# Patient Record
Sex: Male | Born: 1935
Health system: Southern US, Community
[De-identification: ages and names within clinical notes are randomized; demographics above are authoritative.]

## PROBLEM LIST (undated history)

## (undated) DIAGNOSIS — K219 Gastro-esophageal reflux disease without esophagitis: Secondary | ICD-10-CM

## (undated) DIAGNOSIS — I10 Essential (primary) hypertension: Secondary | ICD-10-CM

## (undated) DIAGNOSIS — I639 Cerebral infarction, unspecified: Secondary | ICD-10-CM

## (undated) DIAGNOSIS — I2510 Atherosclerotic heart disease of native coronary artery without angina pectoris: Secondary | ICD-10-CM

## (undated) DIAGNOSIS — I259 Chronic ischemic heart disease, unspecified: Secondary | ICD-10-CM

## (undated) DIAGNOSIS — E785 Hyperlipidemia, unspecified: Secondary | ICD-10-CM

## (undated) DIAGNOSIS — Z951 Presence of aortocoronary bypass graft: Secondary | ICD-10-CM

## (undated) DIAGNOSIS — K589 Irritable bowel syndrome without diarrhea: Secondary | ICD-10-CM

## (undated) DIAGNOSIS — E119 Type 2 diabetes mellitus without complications: Secondary | ICD-10-CM

## (undated) DIAGNOSIS — Z87438 Personal history of other diseases of male genital organs: Secondary | ICD-10-CM

## (undated) DIAGNOSIS — I5189 Other ill-defined heart diseases: Secondary | ICD-10-CM

## (undated) DIAGNOSIS — J310 Chronic rhinitis: Secondary | ICD-10-CM

## (undated) DIAGNOSIS — B029 Zoster without complications: Principal | ICD-10-CM

## (undated) DIAGNOSIS — F419 Anxiety disorder, unspecified: Secondary | ICD-10-CM

## (undated) DIAGNOSIS — K5790 Diverticulosis of intestine, part unspecified, without perforation or abscess without bleeding: Secondary | ICD-10-CM

## (undated) DIAGNOSIS — F32A Depression, unspecified: Secondary | ICD-10-CM

## (undated) DIAGNOSIS — I219 Acute myocardial infarction, unspecified: Secondary | ICD-10-CM

## (undated) DIAGNOSIS — Z8619 Personal history of other infectious and parasitic diseases: Secondary | ICD-10-CM

## (undated) DIAGNOSIS — K760 Fatty (change of) liver, not elsewhere classified: Secondary | ICD-10-CM

## (undated) DIAGNOSIS — C439 Malignant melanoma of skin, unspecified: Secondary | ICD-10-CM

## (undated) DIAGNOSIS — I4510 Unspecified right bundle-branch block: Secondary | ICD-10-CM

## (undated) HISTORY — DX: Hyperlipidemia, unspecified: E78.5

## (undated) HISTORY — DX: Fatty (change of) liver, not elsewhere classified: K76.0

## (undated) HISTORY — DX: Chronic rhinitis: J31.0

## (undated) HISTORY — DX: Cerebral infarction, unspecified: I63.9

## (undated) HISTORY — DX: Gastro-esophageal reflux disease without esophagitis: K21.9

## (undated) HISTORY — DX: Zoster without complications: B02.9

## (undated) HISTORY — DX: Essential (primary) hypertension: I10

## (undated) HISTORY — DX: Other ill-defined heart diseases: I51.89

## (undated) HISTORY — DX: Diverticulosis of intestine, part unspecified, without perforation or abscess without bleeding: K57.90

## (undated) HISTORY — DX: Type 2 diabetes mellitus without complications: E11.9

## (undated) HISTORY — PX: COLONOSCOPY: SHX174

## (undated) HISTORY — DX: Acute myocardial infarction, unspecified: I21.9

## (undated) HISTORY — DX: Personal history of other diseases of male genital organs: Z87.438

## (undated) HISTORY — DX: Malignant melanoma of skin, unspecified: C43.9

## (undated) HISTORY — DX: Personal history of other infectious and parasitic diseases: Z86.19

## (undated) HISTORY — PX: CARDIAC CATHETERIZATION: SHX172

## (undated) HISTORY — DX: Atherosclerotic heart disease of native coronary artery without angina pectoris: I25.10

## (undated) HISTORY — DX: Chronic ischemic heart disease, unspecified: I25.9

---

## 1983-02-19 HISTORY — PX: CHOLECYSTECTOMY: SHX55

## 1993-02-18 DIAGNOSIS — I219 Acute myocardial infarction, unspecified: Secondary | ICD-10-CM

## 1993-02-18 HISTORY — DX: Acute myocardial infarction, unspecified: I21.9

## 1993-05-19 HISTORY — PX: CORONARY ARTERY BYPASS GRAFT: SHX141

## 1997-12-19 ENCOUNTER — Encounter: Payer: Self-pay | Admitting: Family Medicine

## 1997-12-19 LAB — CONVERTED CEMR LAB: PSA: 0.3 ng/mL

## 1999-11-19 ENCOUNTER — Encounter: Payer: Self-pay | Admitting: Family Medicine

## 1999-11-19 LAB — CONVERTED CEMR LAB: PSA: 1.5 ng/mL

## 2000-11-18 HISTORY — PX: INGUINAL HERNIA REPAIR: SUR1180

## 2001-10-19 ENCOUNTER — Encounter: Payer: Self-pay | Admitting: Family Medicine

## 2002-03-21 HISTORY — PX: ANKLE SURGERY: SHX546

## 2004-02-19 ENCOUNTER — Encounter: Payer: Self-pay | Admitting: Family Medicine

## 2004-02-27 ENCOUNTER — Ambulatory Visit: Payer: Self-pay | Admitting: Family Medicine

## 2004-03-05 ENCOUNTER — Ambulatory Visit: Payer: Self-pay | Admitting: Family Medicine

## 2004-03-08 ENCOUNTER — Ambulatory Visit: Payer: Self-pay | Admitting: Family Medicine

## 2004-03-21 ENCOUNTER — Ambulatory Visit: Payer: Self-pay | Admitting: Family Medicine

## 2004-05-10 ENCOUNTER — Ambulatory Visit: Payer: Self-pay | Admitting: Family Medicine

## 2004-08-09 ENCOUNTER — Ambulatory Visit: Payer: Self-pay | Admitting: Family Medicine

## 2004-08-13 ENCOUNTER — Ambulatory Visit: Payer: Self-pay | Admitting: Family Medicine

## 2005-02-26 ENCOUNTER — Ambulatory Visit: Payer: Self-pay | Admitting: Family Medicine

## 2005-04-02 ENCOUNTER — Ambulatory Visit: Payer: Self-pay | Admitting: Gastroenterology

## 2005-04-02 LAB — HM COLONOSCOPY

## 2005-07-11 ENCOUNTER — Ambulatory Visit: Payer: Self-pay | Admitting: Family Medicine

## 2005-07-16 ENCOUNTER — Ambulatory Visit: Payer: Self-pay | Admitting: Family Medicine

## 2005-08-22 ENCOUNTER — Ambulatory Visit: Payer: Self-pay | Admitting: Family Medicine

## 2005-08-27 ENCOUNTER — Ambulatory Visit: Payer: Self-pay | Admitting: Family Medicine

## 2006-02-27 ENCOUNTER — Ambulatory Visit: Payer: Self-pay | Admitting: Family Medicine

## 2006-02-27 LAB — CONVERTED CEMR LAB
ALT: 45 units/L — ABNORMAL HIGH (ref 0–40)
AST: 36 units/L (ref 0–37)
Calcium: 9.8 mg/dL (ref 8.4–10.5)
Chloride: 98 meq/L (ref 96–112)
Cholesterol: 108 mg/dL (ref 0–200)
Creatinine, Ser: 1 mg/dL (ref 0.4–1.5)
Creatinine,U: 143.9 mg/dL
Glomerular Filtration Rate, Af Am: 95 mL/min/{1.73_m2}
Glucose, Bld: 123 mg/dL — ABNORMAL HIGH (ref 70–99)
HDL: 35.5 mg/dL — ABNORMAL LOW (ref >39.0)
LDL Cholesterol: 38 mg/dL (ref 0–99)
Microalb Creat Ratio: 2.8 mg/g (ref 0.0–30.0)
Microalb, Ur: 0.4 mg/dL (ref 0.0–1.9)
Potassium: 4.7 meq/L (ref 3.5–5.1)
Sodium: 138 meq/L (ref 135–145)
Triglyceride fasting, serum: 173 mg/dL — ABNORMAL HIGH (ref 0–149)
VLDL: 35 mg/dL (ref 0–40)

## 2006-03-03 ENCOUNTER — Ambulatory Visit: Payer: Self-pay | Admitting: Family Medicine

## 2006-05-05 ENCOUNTER — Ambulatory Visit: Payer: Self-pay | Admitting: Family Medicine

## 2006-07-01 ENCOUNTER — Encounter: Payer: Self-pay | Admitting: Family Medicine

## 2006-07-01 DIAGNOSIS — I2510 Atherosclerotic heart disease of native coronary artery without angina pectoris: Secondary | ICD-10-CM | POA: Insufficient documentation

## 2006-07-01 DIAGNOSIS — K589 Irritable bowel syndrome without diarrhea: Secondary | ICD-10-CM | POA: Insufficient documentation

## 2006-07-01 DIAGNOSIS — F528 Other sexual dysfunction not due to a substance or known physiological condition: Secondary | ICD-10-CM

## 2006-07-03 ENCOUNTER — Ambulatory Visit: Payer: Self-pay | Admitting: Family Medicine

## 2006-07-03 DIAGNOSIS — E669 Obesity, unspecified: Secondary | ICD-10-CM

## 2006-07-07 ENCOUNTER — Ambulatory Visit: Payer: Self-pay | Admitting: Family Medicine

## 2006-07-07 DIAGNOSIS — B029 Zoster without complications: Secondary | ICD-10-CM | POA: Insufficient documentation

## 2006-07-07 DIAGNOSIS — N3941 Urge incontinence: Secondary | ICD-10-CM | POA: Insufficient documentation

## 2006-07-28 ENCOUNTER — Ambulatory Visit: Payer: Self-pay | Admitting: Family Medicine

## 2006-07-28 ENCOUNTER — Telehealth (INDEPENDENT_AMBULATORY_CARE_PROVIDER_SITE_OTHER): Payer: Self-pay | Admitting: *Deleted

## 2006-07-28 DIAGNOSIS — C44201 Unspecified malignant neoplasm of skin of unspecified ear and external auricular canal: Secondary | ICD-10-CM | POA: Insufficient documentation

## 2006-07-29 ENCOUNTER — Other Ambulatory Visit: Payer: Self-pay

## 2006-07-29 ENCOUNTER — Emergency Department: Payer: No Typology Code available for payment source | Admitting: Emergency Medicine

## 2006-07-30 ENCOUNTER — Telehealth (INDEPENDENT_AMBULATORY_CARE_PROVIDER_SITE_OTHER): Payer: Self-pay | Admitting: *Deleted

## 2006-07-30 ENCOUNTER — Ambulatory Visit: Payer: Self-pay | Admitting: Family Medicine

## 2006-07-30 IMAGING — CR DG CHEST 2V
1 series · 2 of 2 positions shown · non-contrast
Comparison: none

REASON FOR EXAM: fever  rm 14
COMMENTS:

[Series 1: view not recorded · 0.17mm/px · 2 of 2 slices shown]
[im 1/2]
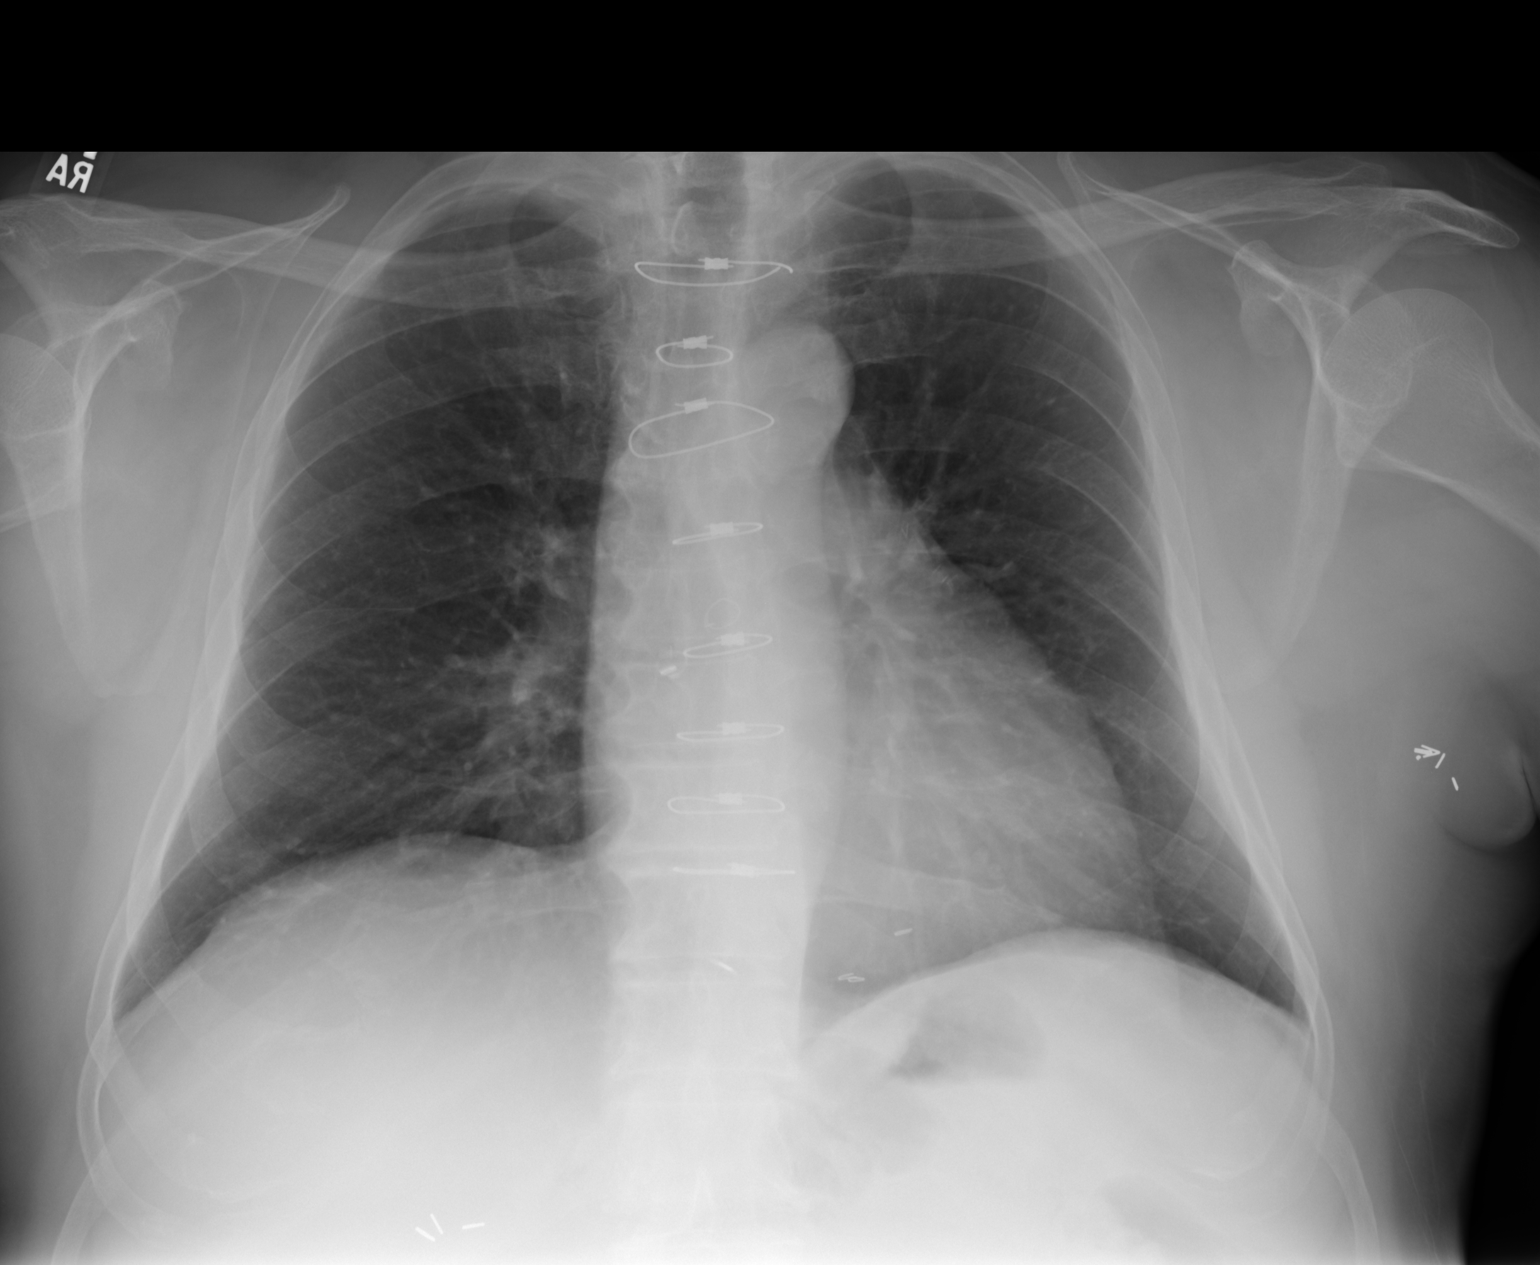
[im 2/2]
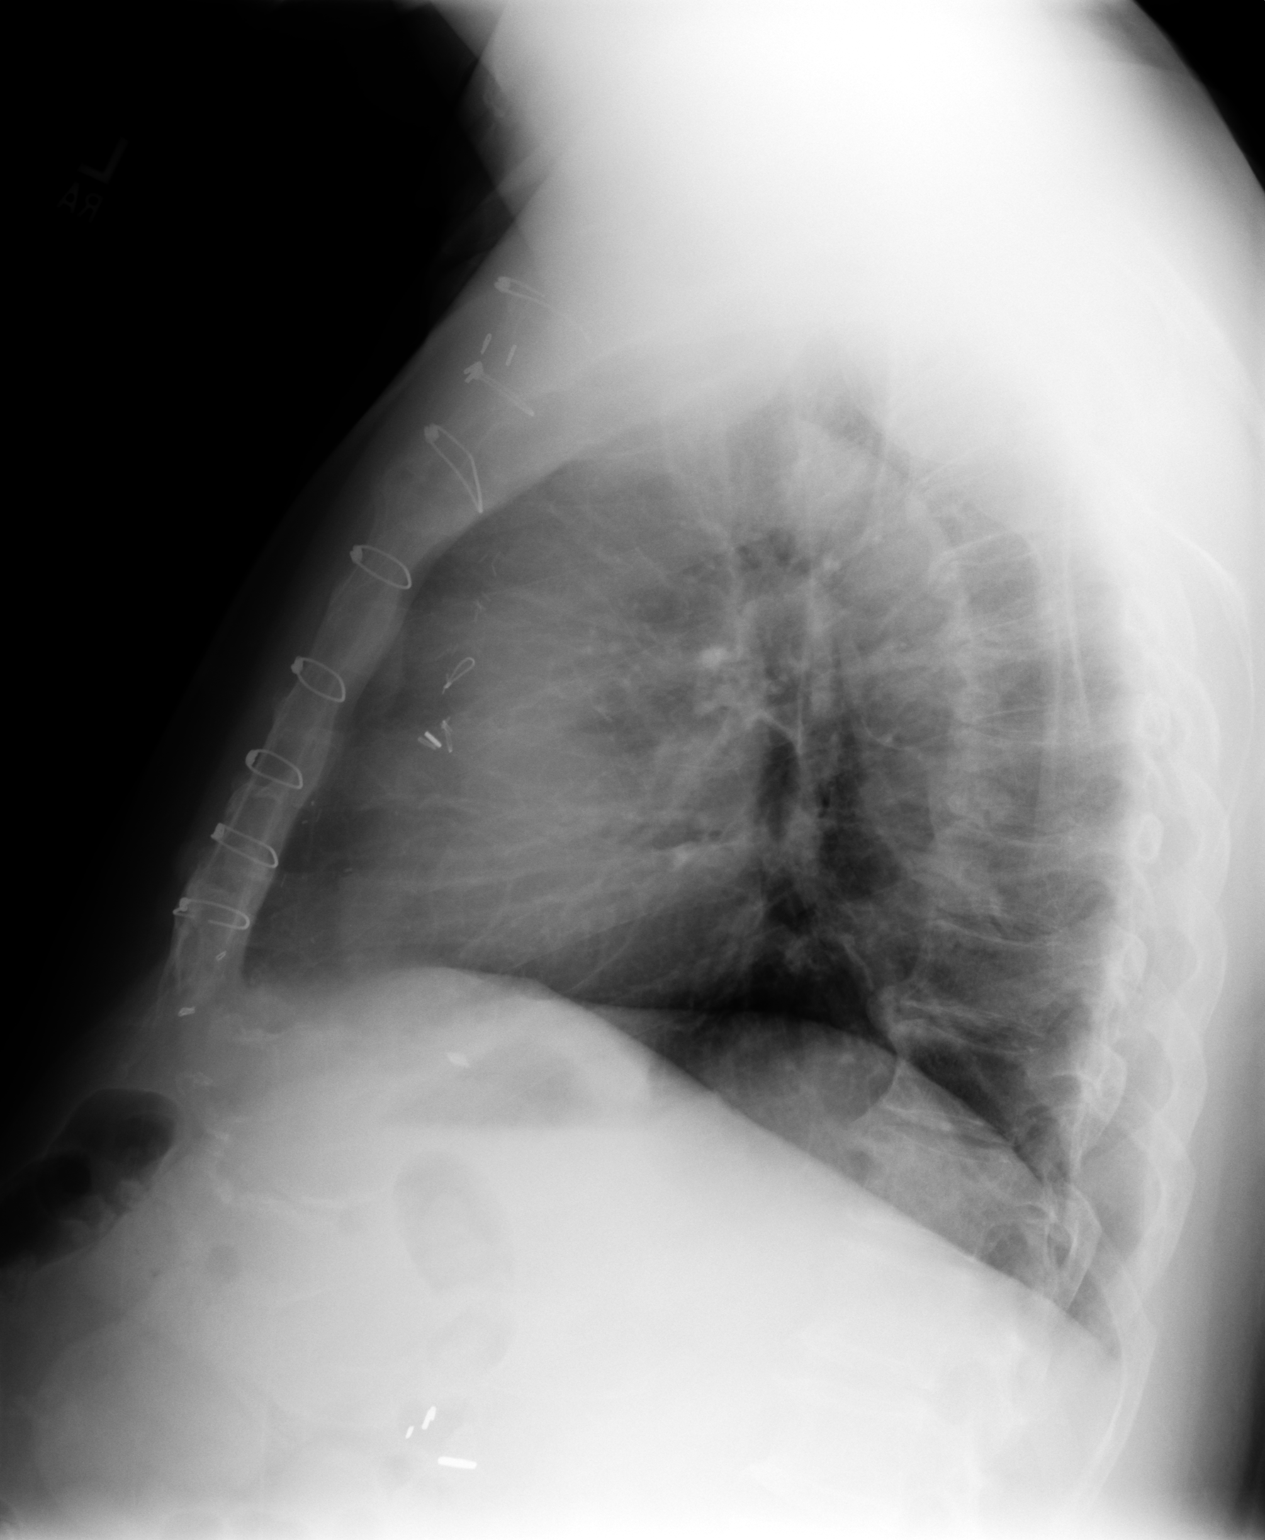

[2 of 2 positions shown; findings below may reference images not displayed]

PROCEDURE:     DXR - DXR CHEST PA (OR AP) AND LATERAL  - [DATE] [DATE]

RESULT:     Lungs are adequately inflated and clear. The cardiac silhouette
is top normal in size. There is tortuosity the descending thoracic aorta.
The patient has undergone prior median sternotomy and presumably internal
mammary artery dissection.
IMPRESSION: I do not see evidence of acute cardiopulmonary abnormality.
Specifically I do not see evidence of pneumonia.

## 2007-01-14 ENCOUNTER — Telehealth: Payer: Self-pay | Admitting: Family Medicine

## 2007-05-07 ENCOUNTER — Ambulatory Visit: Payer: Self-pay | Admitting: Family Medicine

## 2007-05-07 DIAGNOSIS — M549 Dorsalgia, unspecified: Secondary | ICD-10-CM | POA: Insufficient documentation

## 2007-05-07 LAB — CONVERTED CEMR LAB
Blood in Urine, dipstick: NEGATIVE
Glucose, Urine, Semiquant: NEGATIVE
Ketones, urine, test strip: NEGATIVE
Urobilinogen, UA: 0.2
WBC Urine, dipstick: NEGATIVE
pH: 6

## 2007-05-11 ENCOUNTER — Encounter: Payer: Self-pay | Admitting: Family Medicine

## 2007-05-28 ENCOUNTER — Encounter: Payer: Self-pay | Admitting: Family Medicine

## 2007-05-28 ENCOUNTER — Encounter: Admission: RE | Admit: 2007-05-28 | Discharge: 2007-05-28 | Payer: Self-pay | Admitting: Cardiology

## 2007-05-28 IMAGING — US US AORTA
1 series · 14 of 25 positions shown · non-contrast
Comparison: None

CLINICAL DATA: Back pain, question aortic aneurysm.

ULTRASOUND AORTA

[Series 1: us aorta · 0.33mm/px · 14 of 39 slices shown]
[im 1/39]
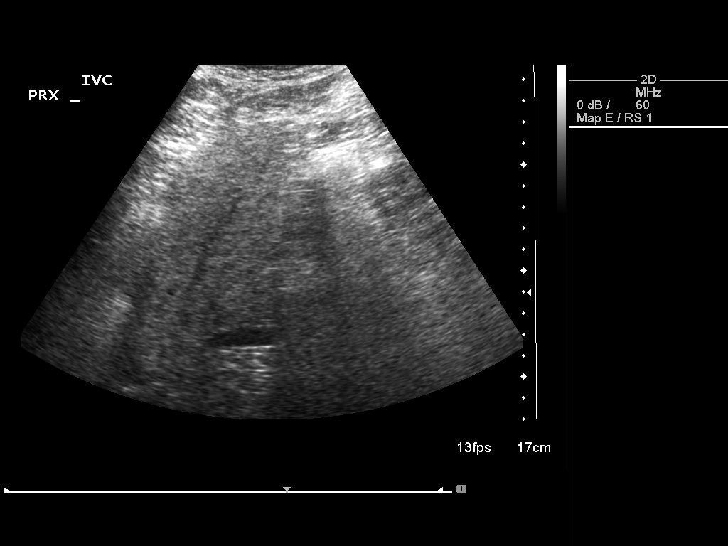
[im 4/39]
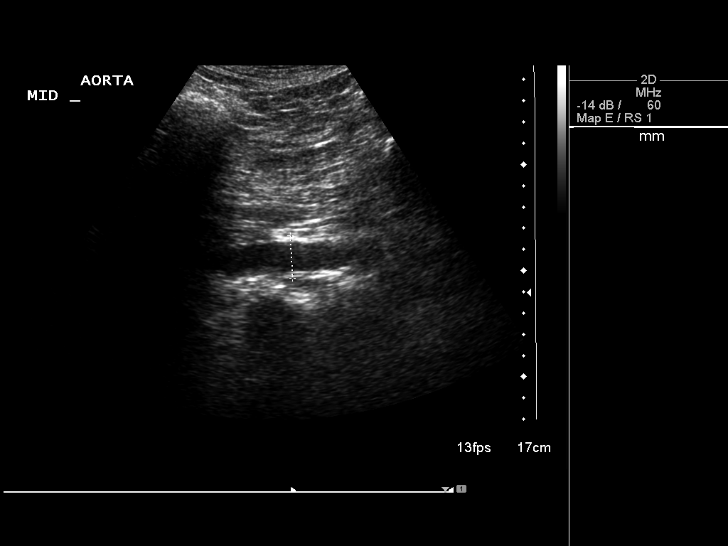
[im 7/39]
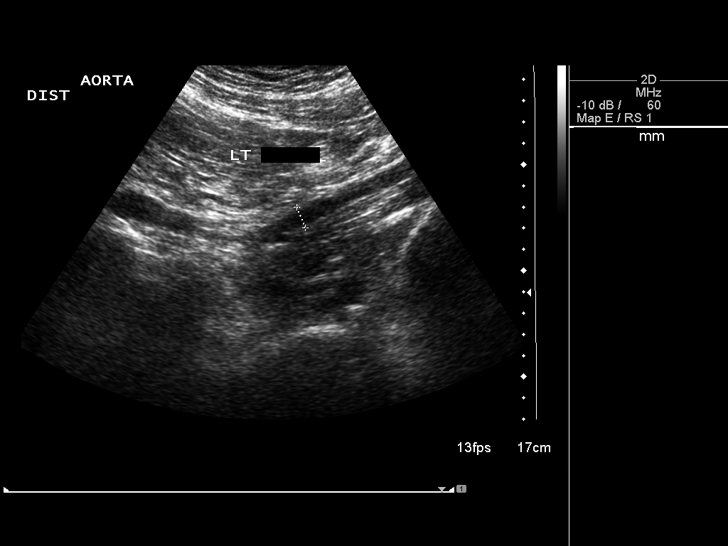
[im 10/39]
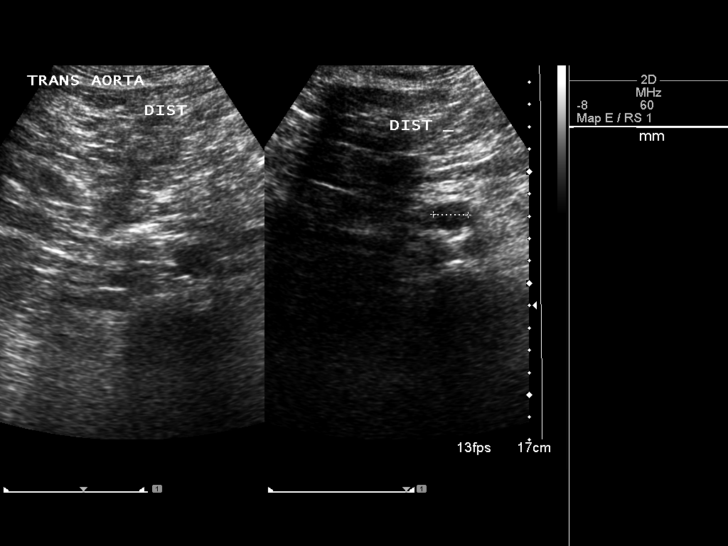
[im 13/39]
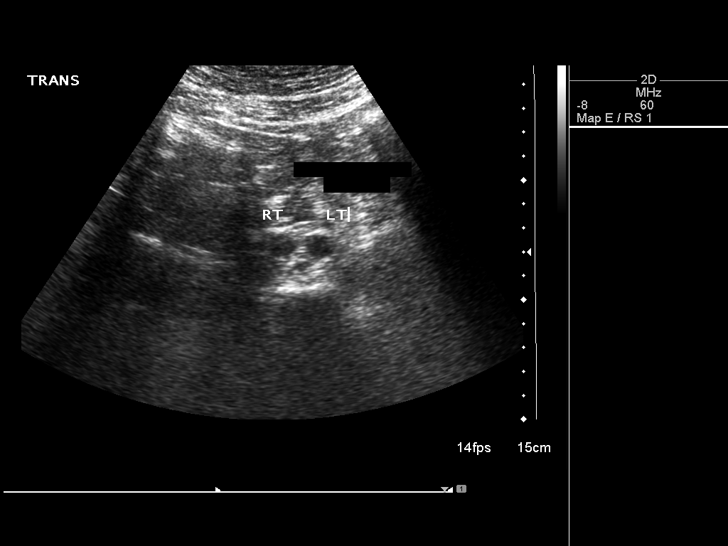
[im 15/39]
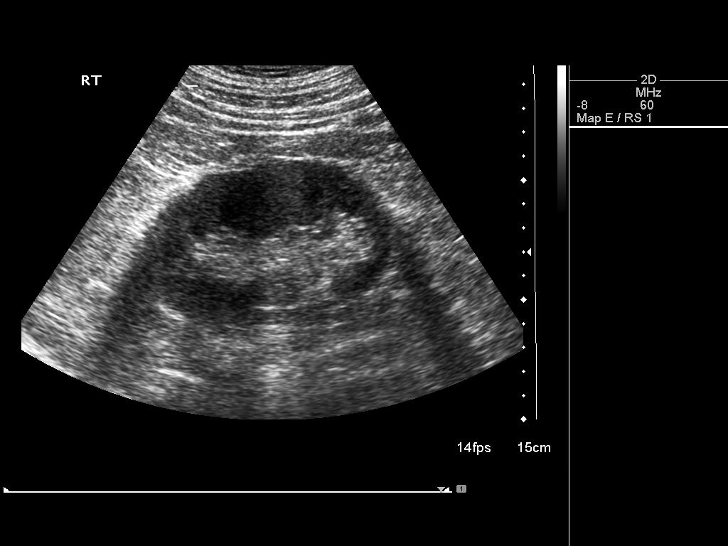
[im 18/39]
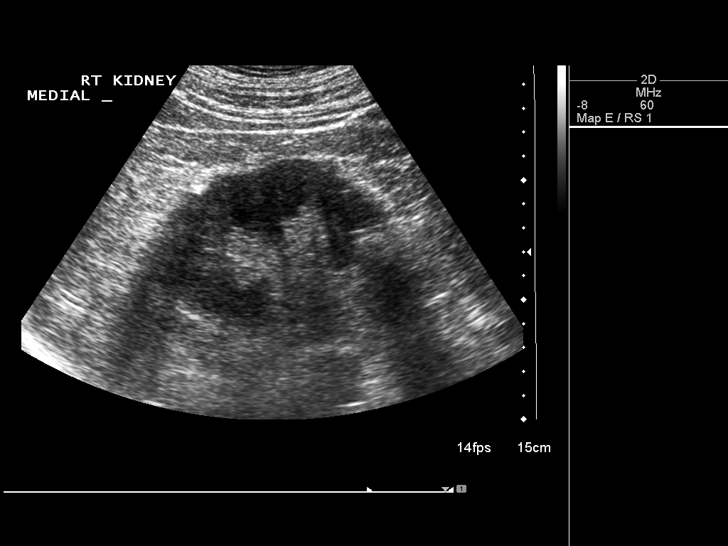
[im 21/39]
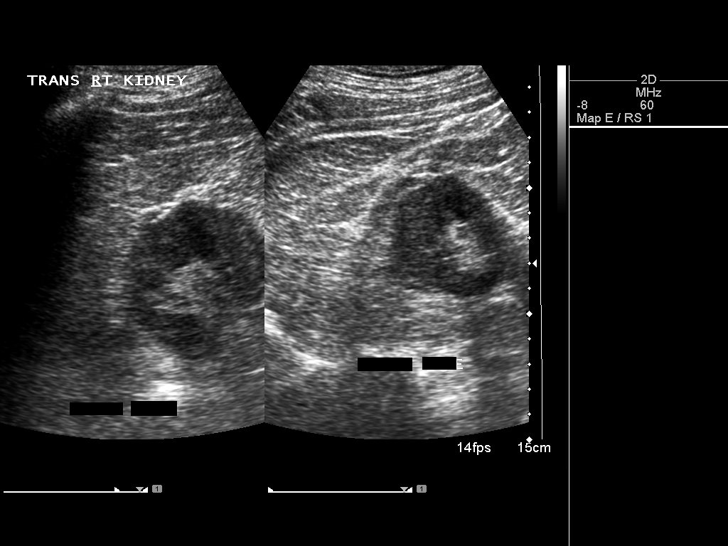
[im 24/39]
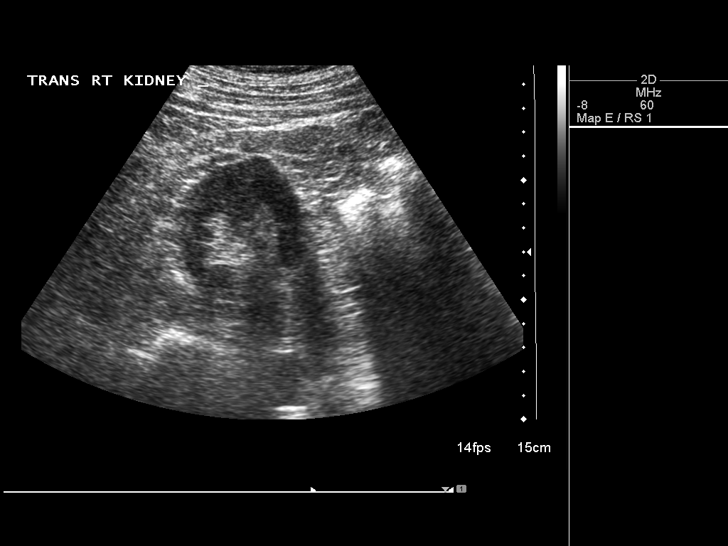
[im 26/39]
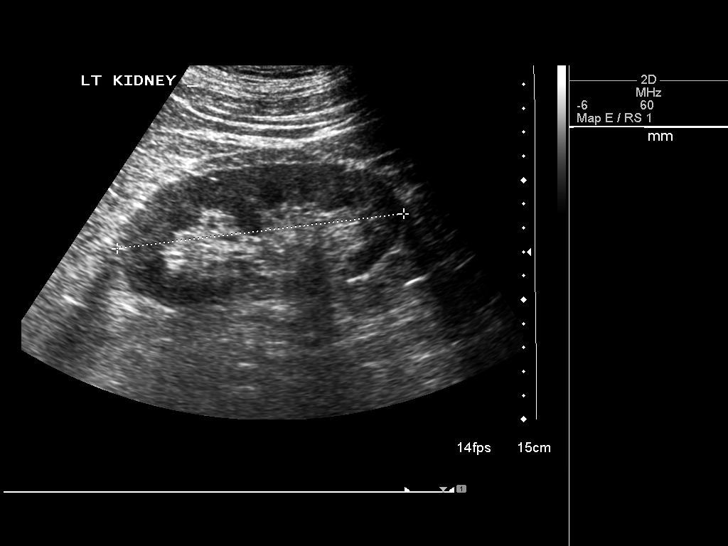
[im 29/39]
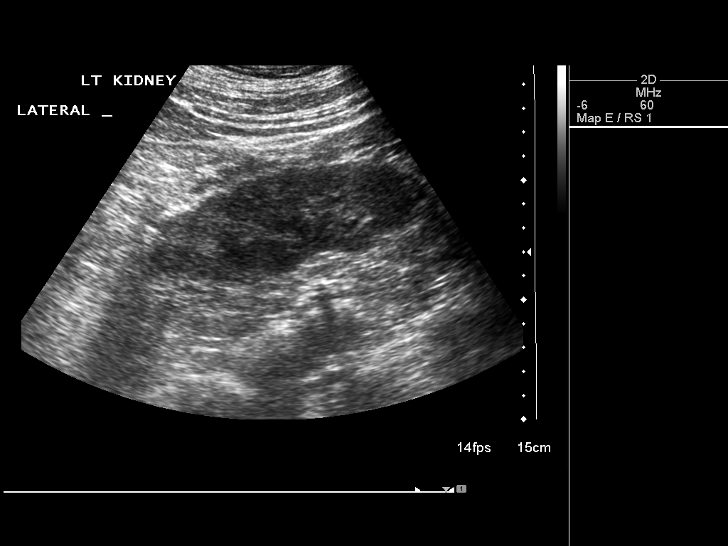
[im 32/39]
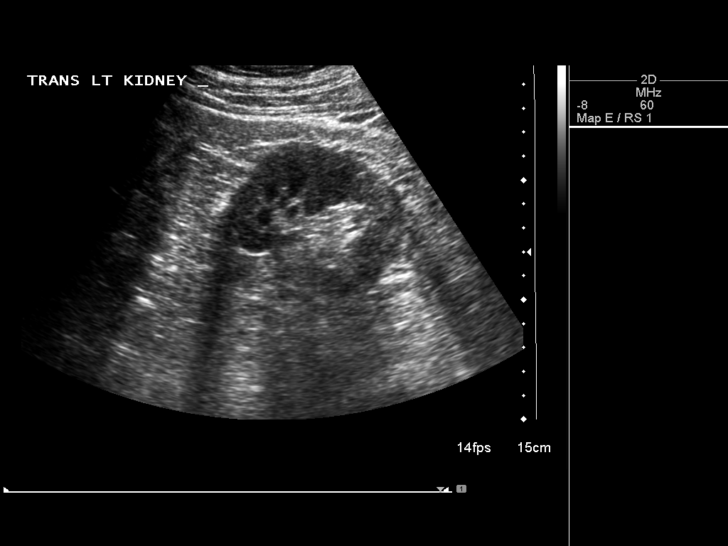
[im 35/39]
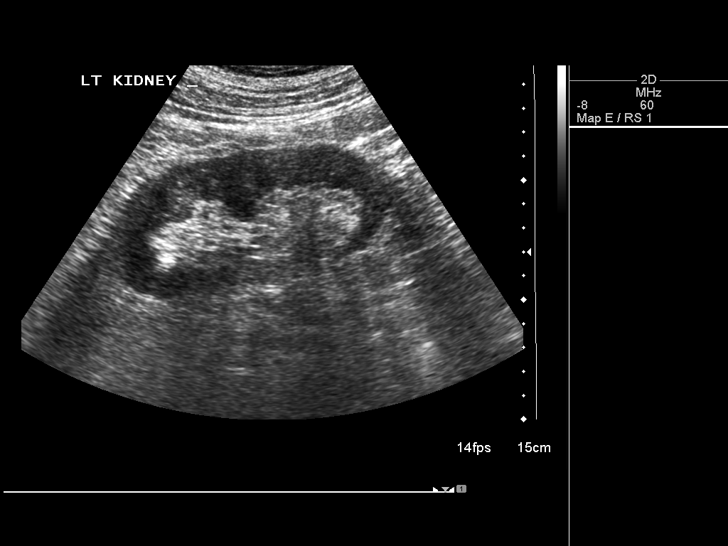
[im 39/39]
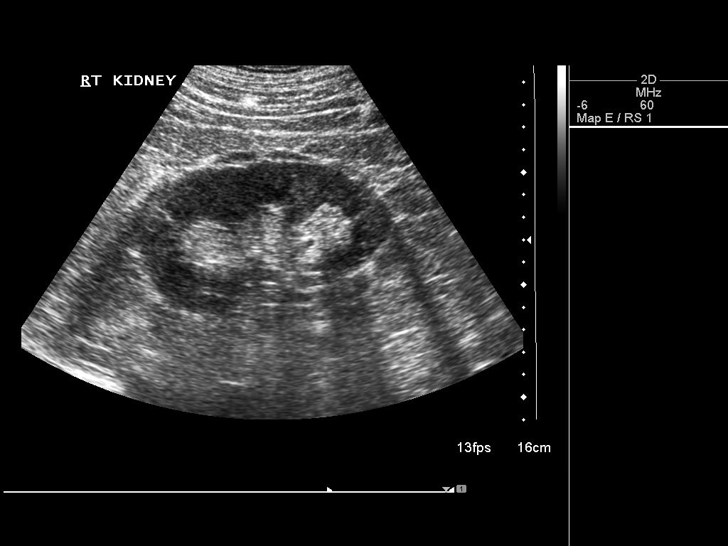

[14 of 25 positions shown; findings below may reference images not displayed]

FINDINGS: The abdominal aorta is normal in caliber with maximal AP
diameter 2.5 cm and transverse diameter 2.2 cm proximally.
Abdominal aorta tapers to bifurcation and demonstrates mural
atheromatous change.  Right common iliac artery maximum diameter
measures 1.1 cm and left 1.3 cm.  Infrahepatic inferior vena cava
is limited for assessment.  Right kidney measures 12.6 cm long with
left kidney measuring 12.1 cm long.  Tiny (approximate 2 mm)
echogenicities at the lower pole right kidney may represent
nonshadowing renal calculi or vascular calcifications.  No
hydronephrosis nor focal renal lesions otherwise seen.
IMPRESSION: 1.  Atheromatous mural change with normal caliber abdominal aorta.
2.  Tiny echogenicities lower pole right kidney consistent with
nonshadowing nonobstructing renal calculi or vascular
calcifications.
3.  Otherwise no significant abnormality.

## 2007-06-02 ENCOUNTER — Ambulatory Visit: Payer: Self-pay | Admitting: Cardiology

## 2007-06-02 ENCOUNTER — Ambulatory Visit: Payer: Self-pay | Admitting: Family Medicine

## 2007-06-15 ENCOUNTER — Ambulatory Visit: Payer: Self-pay | Admitting: Family Medicine

## 2007-08-24 ENCOUNTER — Encounter: Payer: Self-pay | Admitting: Family Medicine

## 2007-09-15 ENCOUNTER — Ambulatory Visit: Payer: Self-pay | Admitting: Family Medicine

## 2007-10-08 ENCOUNTER — Ambulatory Visit: Payer: Self-pay | Admitting: Family Medicine

## 2007-10-08 LAB — CONVERTED CEMR LAB
BUN: 25 mg/dL — ABNORMAL HIGH (ref 6–23)
CO2: 27 meq/L (ref 19–32)
Chloride: 102 meq/L (ref 96–112)
Cholesterol: 131 mg/dL (ref 0–200)
Creatinine, Ser: 1 mg/dL (ref 0.4–1.5)
Glucose, Bld: 131 mg/dL — ABNORMAL HIGH (ref 70–99)
HDL: 29.7 mg/dL — ABNORMAL LOW (ref >39.0)
PSA: 0.24 ng/mL (ref 0.10–4.00)
Triglycerides: 143 mg/dL (ref 0–149)

## 2007-10-15 ENCOUNTER — Ambulatory Visit: Payer: Self-pay | Admitting: Family Medicine

## 2007-10-15 LAB — FECAL OCCULT BLOOD, GUAIAC: Fecal Occult Blood: NEGATIVE

## 2007-10-15 LAB — CONVERTED CEMR LAB
OCCULT 2: NEGATIVE
OCCULT 3: NEGATIVE

## 2007-10-19 ENCOUNTER — Encounter (INDEPENDENT_AMBULATORY_CARE_PROVIDER_SITE_OTHER): Payer: Self-pay | Admitting: *Deleted

## 2007-11-30 ENCOUNTER — Telehealth: Payer: Self-pay | Admitting: Family Medicine

## 2007-12-09 ENCOUNTER — Encounter: Payer: Self-pay | Admitting: Cardiology

## 2008-02-03 ENCOUNTER — Telehealth: Payer: Self-pay | Admitting: Family Medicine

## 2008-03-14 ENCOUNTER — Ambulatory Visit: Payer: Self-pay | Admitting: Family Medicine

## 2008-03-14 DIAGNOSIS — G4700 Insomnia, unspecified: Secondary | ICD-10-CM

## 2008-03-14 LAB — CONVERTED CEMR LAB
Bacteria, UA: 0
Bilirubin Urine: NEGATIVE
Blood in Urine, dipstick: NEGATIVE
Glucose, Urine, Semiquant: NEGATIVE
Ketones, urine, test strip: NEGATIVE
WBC Urine, dipstick: NEGATIVE

## 2008-03-15 ENCOUNTER — Encounter: Payer: Self-pay | Admitting: Family Medicine

## 2008-03-23 ENCOUNTER — Encounter: Payer: Self-pay | Admitting: Family Medicine

## 2008-04-06 ENCOUNTER — Ambulatory Visit: Payer: Self-pay | Admitting: Family Medicine

## 2008-04-07 LAB — CONVERTED CEMR LAB
ALT: 38 units/L (ref 0–53)
AST: 32 units/L (ref 0–37)
Alkaline Phosphatase: 60 units/L (ref 39–117)
BUN: 15 mg/dL (ref 6–23)
Chloride: 103 meq/L (ref 96–112)
Creatinine,U: 189.5 mg/dL
Direct LDL: 57.5 mg/dL
Eosinophils Relative: 2.9 % (ref 0.0–5.0)
Glucose, Bld: 115 mg/dL — ABNORMAL HIGH (ref 70–99)
HCT: 45.5 % (ref 39.0–52.0)
HDL: 32.5 mg/dL — ABNORMAL LOW (ref >39.0)
Microalb Creat Ratio: 3.7 mg/g (ref 0.0–30.0)
Microalb, Ur: 0.7 mg/dL (ref 0.0–1.9)
Monocytes Relative: 11.1 % (ref 3.0–12.0)
Neutrophils Relative %: 57.9 % (ref 43.0–77.0)
PSA: 0.11 ng/mL (ref 0.10–4.00)
Platelets: 187 10*3/uL (ref 150–400)
Potassium: 5.3 meq/L — ABNORMAL HIGH (ref 3.5–5.1)
TSH: 1.78 microintl units/mL (ref 0.35–5.50)
Total CHOL/HDL Ratio: 3.5
Triglycerides: 204 mg/dL (ref 0–149)
WBC: 7.1 10*3/uL (ref 4.5–10.5)

## 2008-04-11 ENCOUNTER — Ambulatory Visit: Payer: Self-pay | Admitting: Family Medicine

## 2008-04-11 DIAGNOSIS — E559 Vitamin D deficiency, unspecified: Secondary | ICD-10-CM

## 2008-04-11 LAB — CONVERTED CEMR LAB
Blood in Urine, dipstick: NEGATIVE
Protein, U semiquant: NEGATIVE
Urobilinogen, UA: 1
WBC Urine, dipstick: NEGATIVE
pH: 7

## 2008-07-06 ENCOUNTER — Ambulatory Visit: Payer: Self-pay | Admitting: Family Medicine

## 2008-07-07 LAB — CONVERTED CEMR LAB: Vit D, 25-Hydroxy: 30 ng/mL (ref 30–89)

## 2008-07-15 ENCOUNTER — Telehealth (INDEPENDENT_AMBULATORY_CARE_PROVIDER_SITE_OTHER): Payer: Self-pay | Admitting: Internal Medicine

## 2008-07-20 ENCOUNTER — Ambulatory Visit: Payer: Self-pay | Admitting: Family Medicine

## 2008-07-25 ENCOUNTER — Encounter: Payer: Self-pay | Admitting: Family Medicine

## 2008-09-06 ENCOUNTER — Ambulatory Visit: Payer: Self-pay | Admitting: Family Medicine

## 2008-09-06 LAB — CONVERTED CEMR LAB
Bilirubin Urine: NEGATIVE
Glucose, Urine, Semiquant: NEGATIVE
Ketones, urine, test strip: NEGATIVE
Specific Gravity, Urine: 1.02
pH: 6.5

## 2008-09-07 ENCOUNTER — Encounter: Payer: Self-pay | Admitting: Family Medicine

## 2008-09-20 ENCOUNTER — Ambulatory Visit: Payer: Self-pay | Admitting: Family Medicine

## 2008-09-20 LAB — CONVERTED CEMR LAB
Bilirubin Urine: NEGATIVE
Blood in Urine, dipstick: NEGATIVE
Ketones, urine, test strip: NEGATIVE
Specific Gravity, Urine: <1.005

## 2008-10-03 ENCOUNTER — Encounter: Payer: Self-pay | Admitting: Family Medicine

## 2008-10-03 ENCOUNTER — Ambulatory Visit: Payer: No Typology Code available for payment source | Admitting: Urology

## 2008-10-03 HISTORY — PX: CYSTOSCOPY: SUR368

## 2008-10-03 IMAGING — US US RENAL KIDNEY
1 series · 17 of 25 positions shown · non-contrast
Comparison: none

REASON FOR EXAM: hematuria
COMMENTS:

[Series 1: us renal kidney · 17 of 38 slices shown]
[im 1/38]
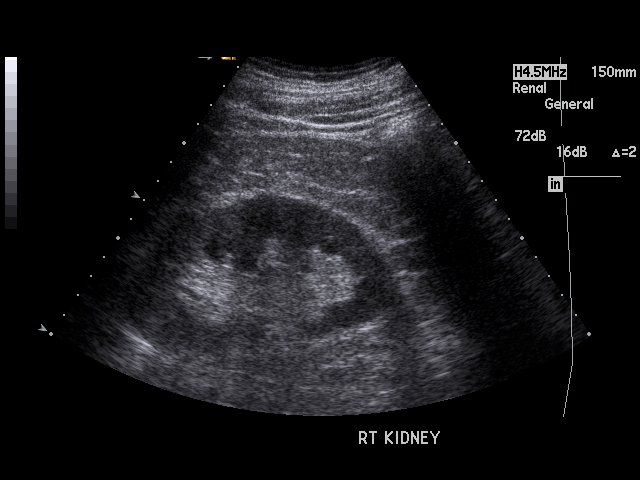
[im 4/38]
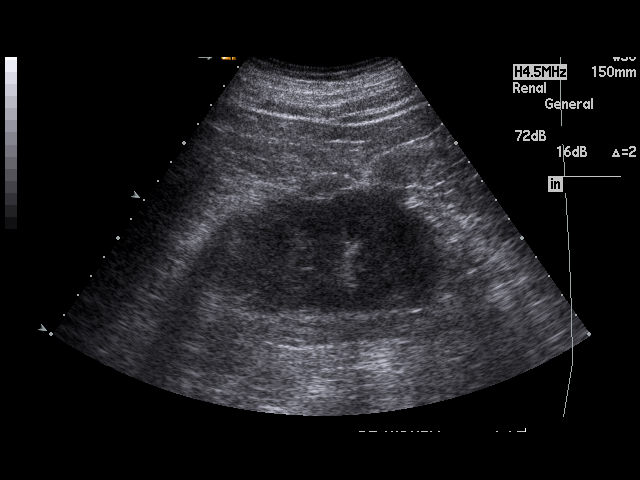
[im 5/38]
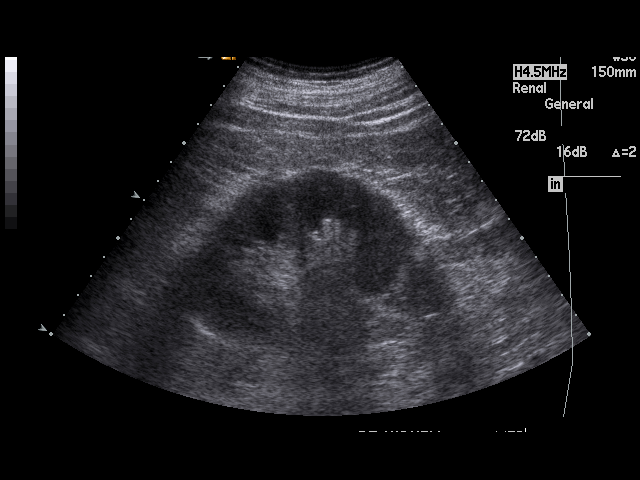
[im 8/38]
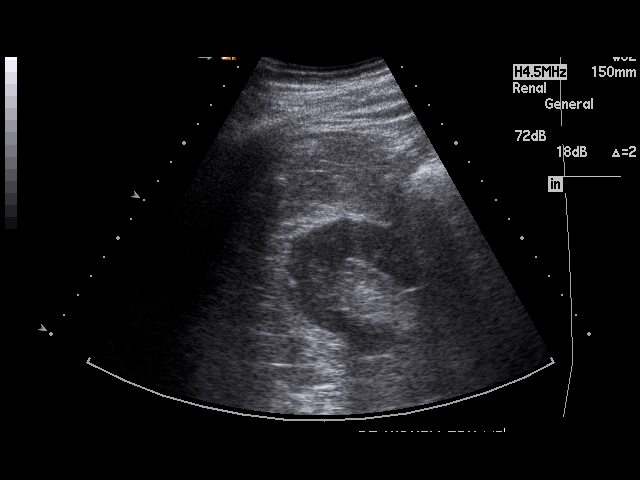
[im 10/38]
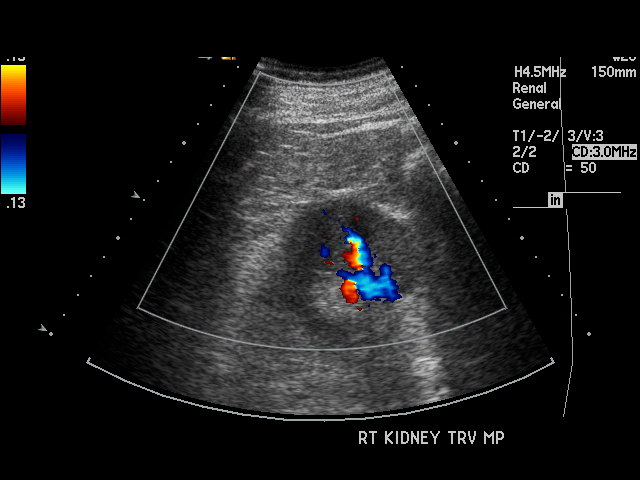
[im 13/38]
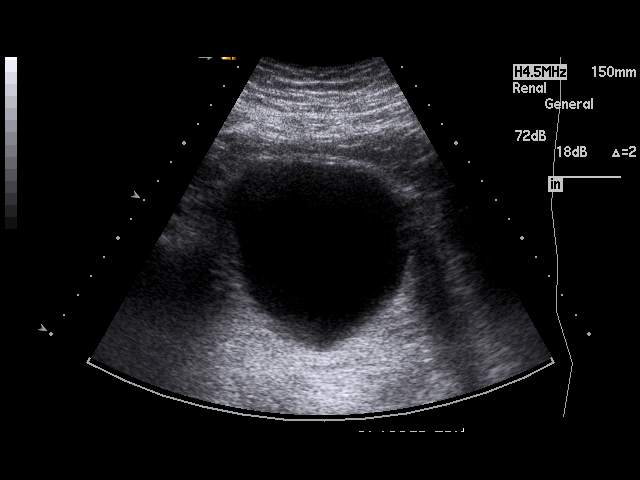
[im 14/38]
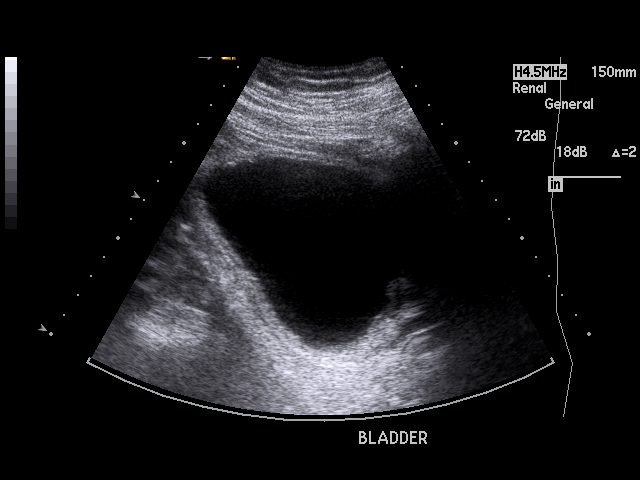
[im 17/38]
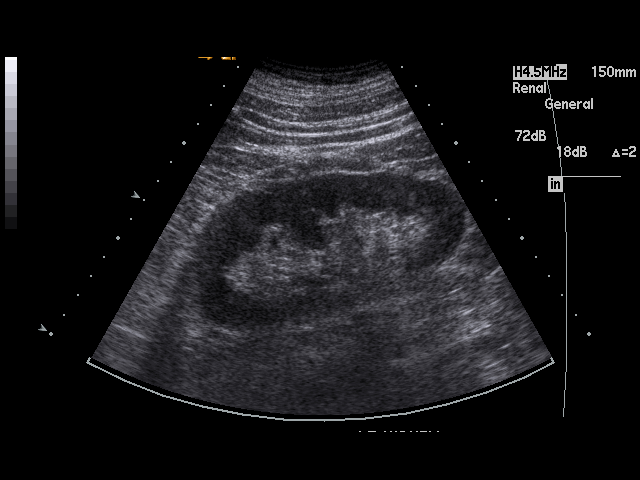
[im 19/38]
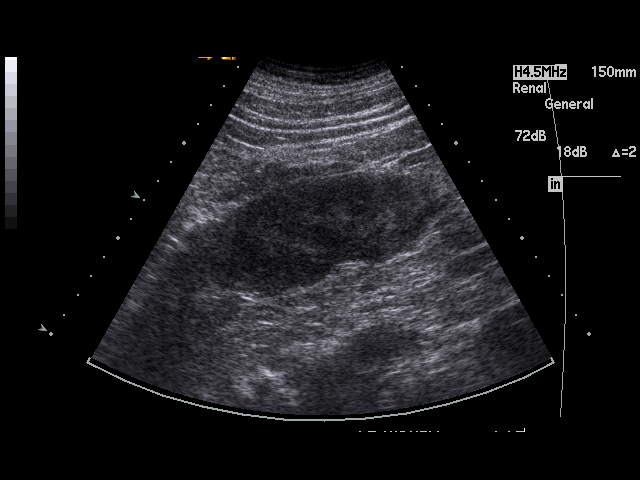
[im 21/38]
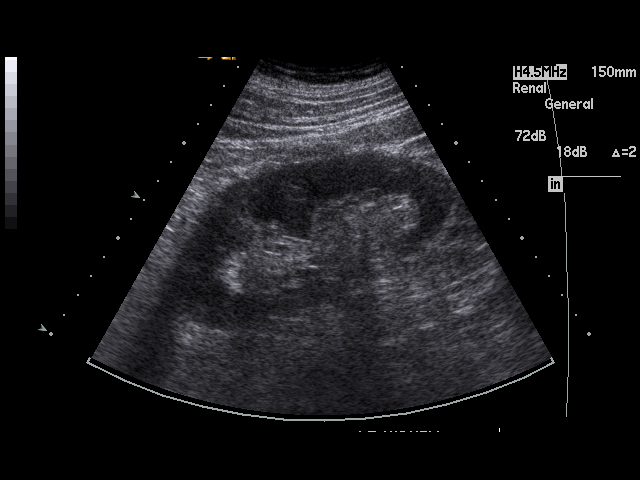
[im 24/38]
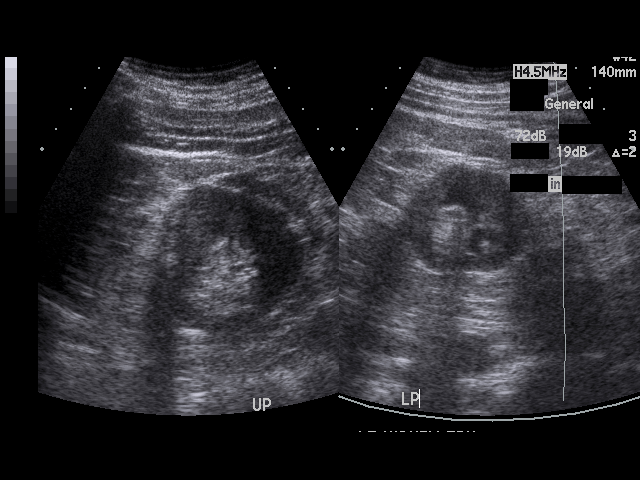
[im 25/38]
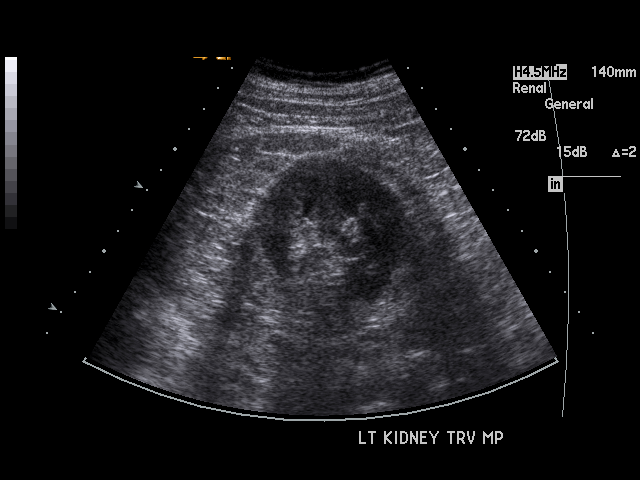
[im 28/38]
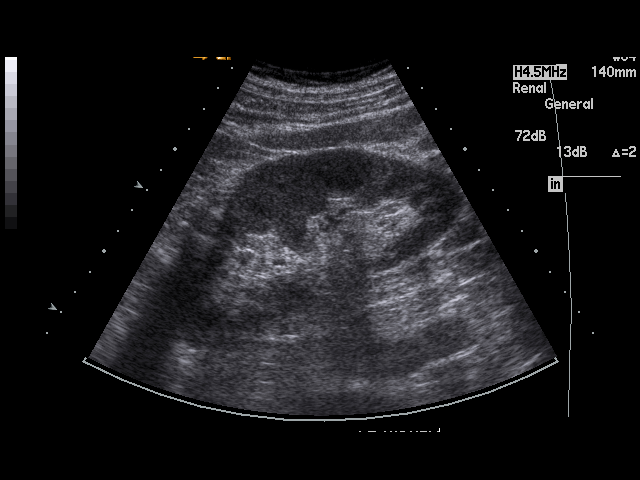
[im 30/38]
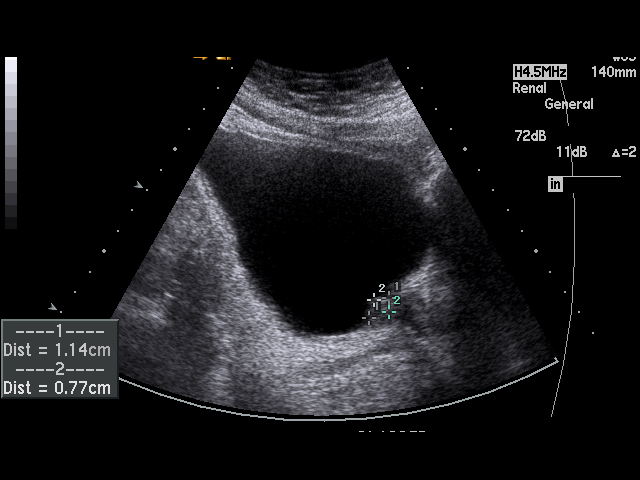
[im 33/38]
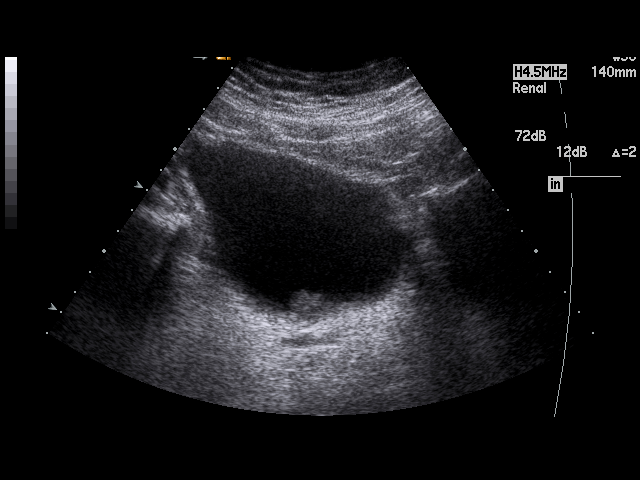
[im 34/38]
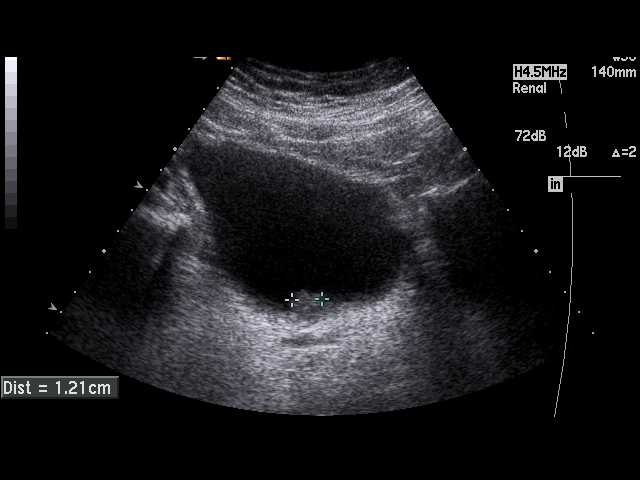
[im 38/38]
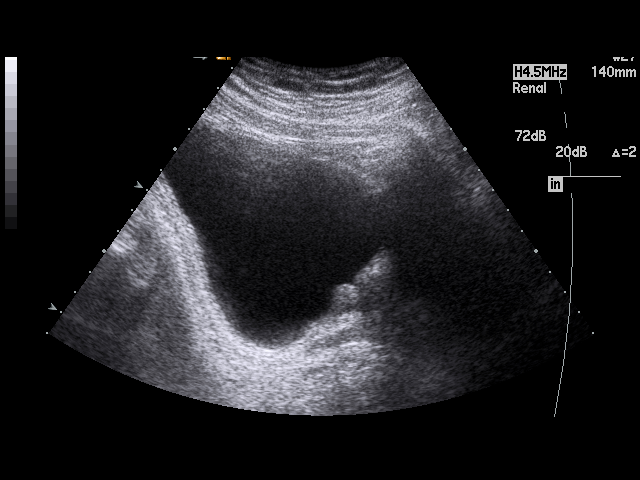

[17 of 25 positions shown; findings below may reference images not displayed]

PROCEDURE:     US  - US KIDNEY  - [DATE]  [DATE]

RESULT:     The right kidney measures 11.22 cm x 5.27 cm x 5.09 cm. The left
kidney measures 11.62 cm x 5.21 cm x 5.98 cm. The renal cortical margins are
smooth. No solid or cystic renal mass lesions are seen. No renal
calcifications are noted. There is no hydronephrosis. There is a 1.21 cm
echo density at the base of the urinary bladder suspicious for a bladder
mass or bladder polyp. The visualized portion of the urinary bladder
otherwise is normal in appearance. Bilateral ureteral flow jets are noted.
IMPRESSION: 1.  Possible mass or polyp at the base of the urinary bladder.
2.  No hydronephrosis or other acute change of the kidneys is seen.

## 2009-01-19 ENCOUNTER — Telehealth: Payer: Self-pay | Admitting: Family Medicine

## 2009-02-09 ENCOUNTER — Encounter: Payer: Self-pay | Admitting: Family Medicine

## 2009-02-20 ENCOUNTER — Ambulatory Visit: Payer: Self-pay | Admitting: Family Medicine

## 2009-03-01 ENCOUNTER — Encounter: Payer: Self-pay | Admitting: Family Medicine

## 2009-03-24 ENCOUNTER — Telehealth: Payer: Self-pay | Admitting: Family Medicine

## 2009-04-07 ENCOUNTER — Ambulatory Visit: Payer: Self-pay | Admitting: Family Medicine

## 2009-04-09 LAB — CONVERTED CEMR LAB
ALT: 54 units/L — ABNORMAL HIGH (ref 0–53)
AST: 37 units/L (ref 0–37)
Alkaline Phosphatase: 52 units/L (ref 39–117)
Basophils Relative: 0.3 % (ref 0.0–3.0)
Bilirubin, Direct: 0.2 mg/dL (ref 0.0–0.3)
Chloride: 102 meq/L (ref 96–112)
Creatinine,U: 196.8 mg/dL
Eosinophils Absolute: 0.2 10*3/uL (ref 0.0–0.7)
Eosinophils Relative: 3.6 % (ref 0.0–5.0)
GFR calc non Af Amer: 87.88 mL/min (ref >60)
LDL Cholesterol: 38 mg/dL (ref 0–99)
Lymphocytes Relative: 24.6 % (ref 12.0–46.0)
MCHC: 34 g/dL (ref 30.0–36.0)
Microalb Creat Ratio: 5.1 mg/g (ref 0.0–30.0)
Microalb, Ur: 1 mg/dL (ref 0.0–1.9)
Monocytes Relative: 8.7 % (ref 3.0–12.0)
Neutrophils Relative %: 62.8 % (ref 43.0–77.0)
Potassium: 4.3 meq/L (ref 3.5–5.1)
RBC: 4.96 M/uL (ref 4.22–5.81)
Sodium: 139 meq/L (ref 135–145)
Total Bilirubin: 0.9 mg/dL (ref 0.3–1.2)
Total CHOL/HDL Ratio: 3
VLDL: 36.4 mg/dL (ref 0.0–40.0)
Vit D, 25-Hydroxy: 26 ng/mL — ABNORMAL LOW (ref 30–89)
WBC: 6.7 10*3/uL (ref 4.5–10.5)

## 2009-04-12 ENCOUNTER — Ambulatory Visit: Payer: Self-pay | Admitting: Family Medicine

## 2009-05-23 ENCOUNTER — Ambulatory Visit: Payer: Self-pay | Admitting: Family Medicine

## 2009-05-23 LAB — CONVERTED CEMR LAB
BUN: 17 mg/dL (ref 6–23)
Chloride: 102 meq/L (ref 96–112)
Creatinine, Ser: 1 mg/dL (ref 0.4–1.5)

## 2009-05-25 ENCOUNTER — Ambulatory Visit: Payer: Self-pay | Admitting: Family Medicine

## 2009-05-25 DIAGNOSIS — M79609 Pain in unspecified limb: Secondary | ICD-10-CM

## 2009-06-01 ENCOUNTER — Telehealth: Payer: Self-pay | Admitting: Family Medicine

## 2009-06-01 ENCOUNTER — Encounter: Payer: Self-pay | Admitting: Family Medicine

## 2009-06-07 ENCOUNTER — Telehealth: Payer: Self-pay | Admitting: Family Medicine

## 2009-06-14 ENCOUNTER — Telehealth: Payer: Self-pay | Admitting: Family Medicine

## 2009-06-28 ENCOUNTER — Telehealth: Payer: Self-pay | Admitting: Family Medicine

## 2009-07-03 ENCOUNTER — Ambulatory Visit: Payer: Self-pay | Admitting: Family Medicine

## 2009-07-21 ENCOUNTER — Encounter: Admission: RE | Admit: 2009-07-21 | Discharge: 2009-07-21 | Payer: Self-pay | Admitting: Cardiology

## 2009-07-21 IMAGING — CR DG CHEST 2V
2 series · 2 of 2 positions shown · non-contrast
Comparison: None

CLINICAL DATA: Chest pain.

CHEST - 2 VIEW

[w chest pa]
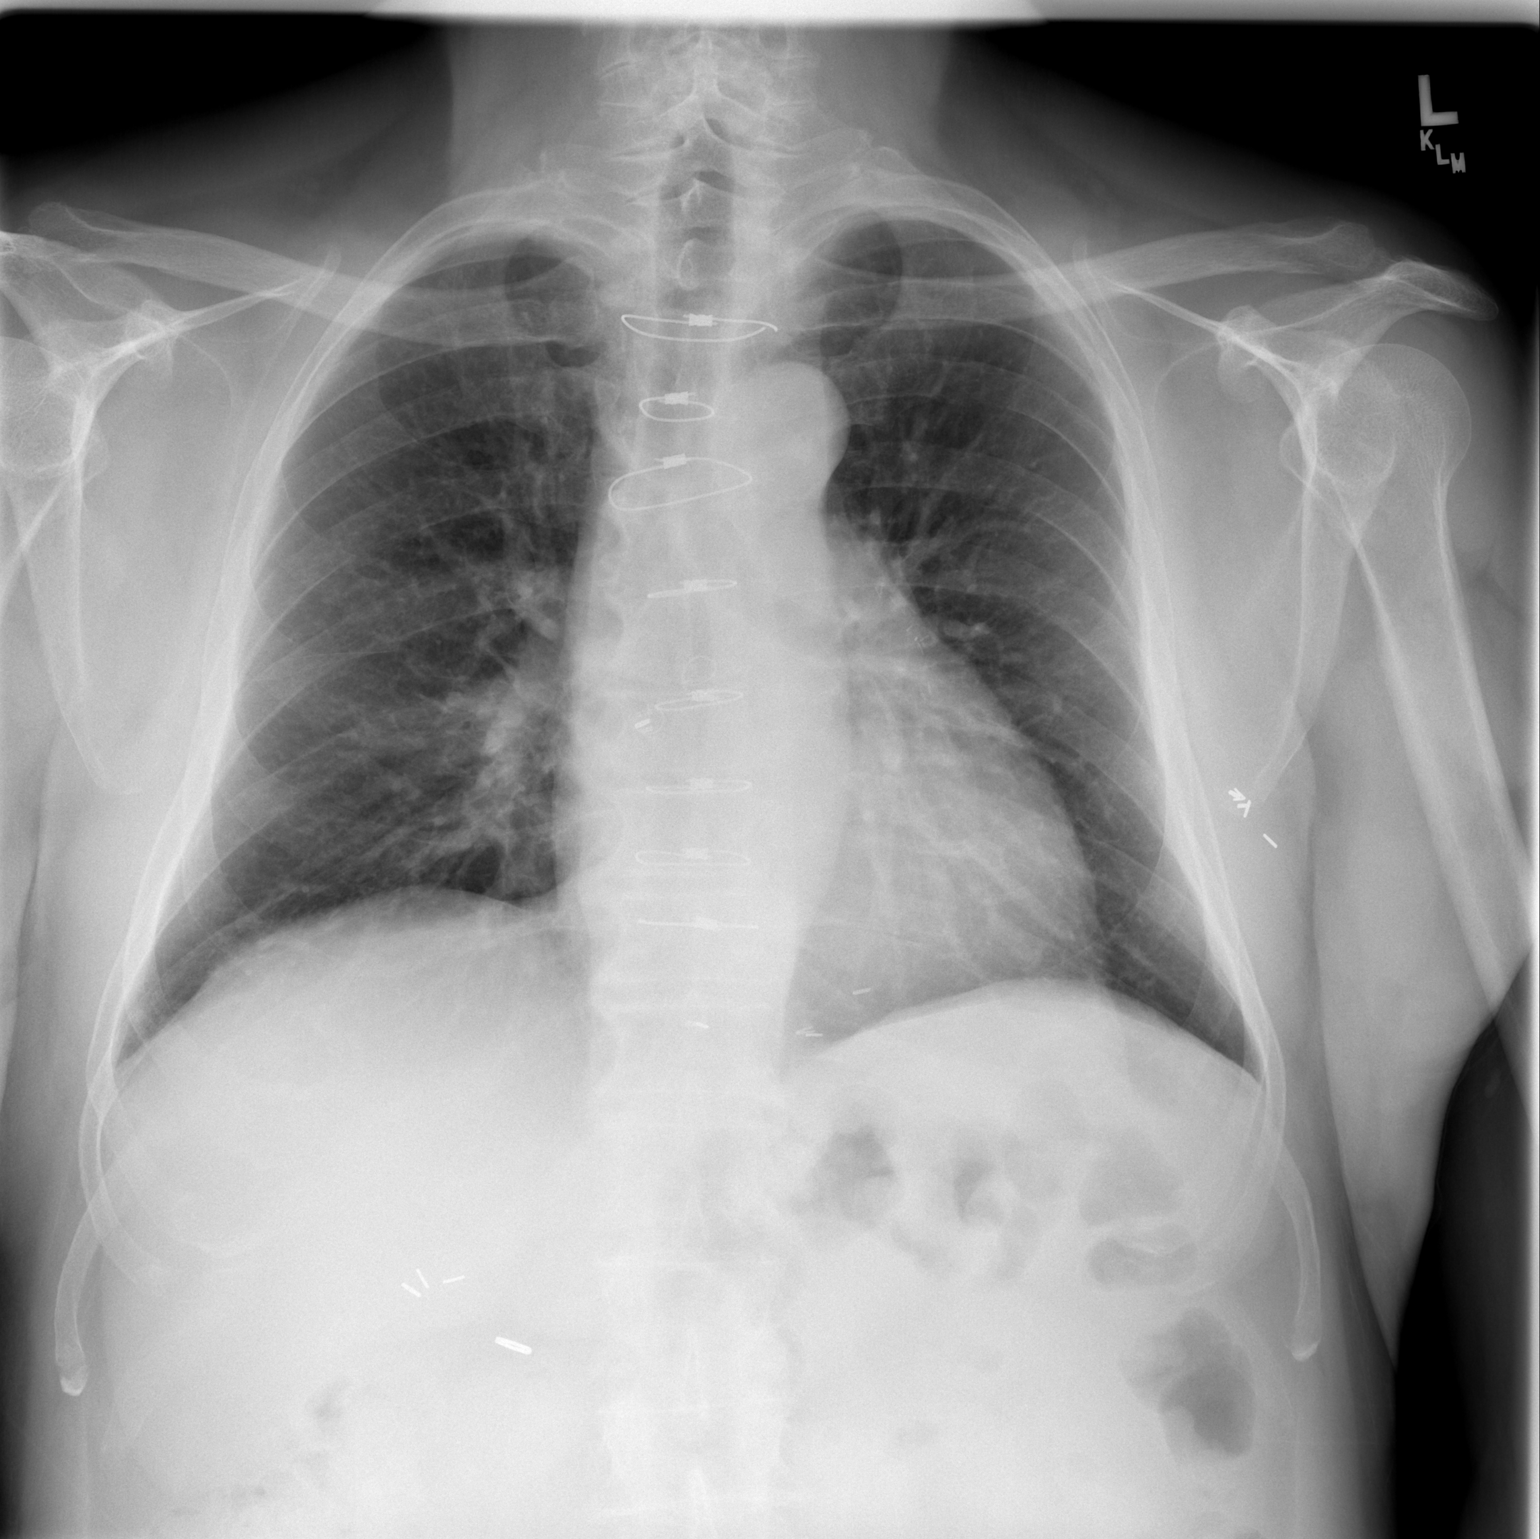

[w chest lat]
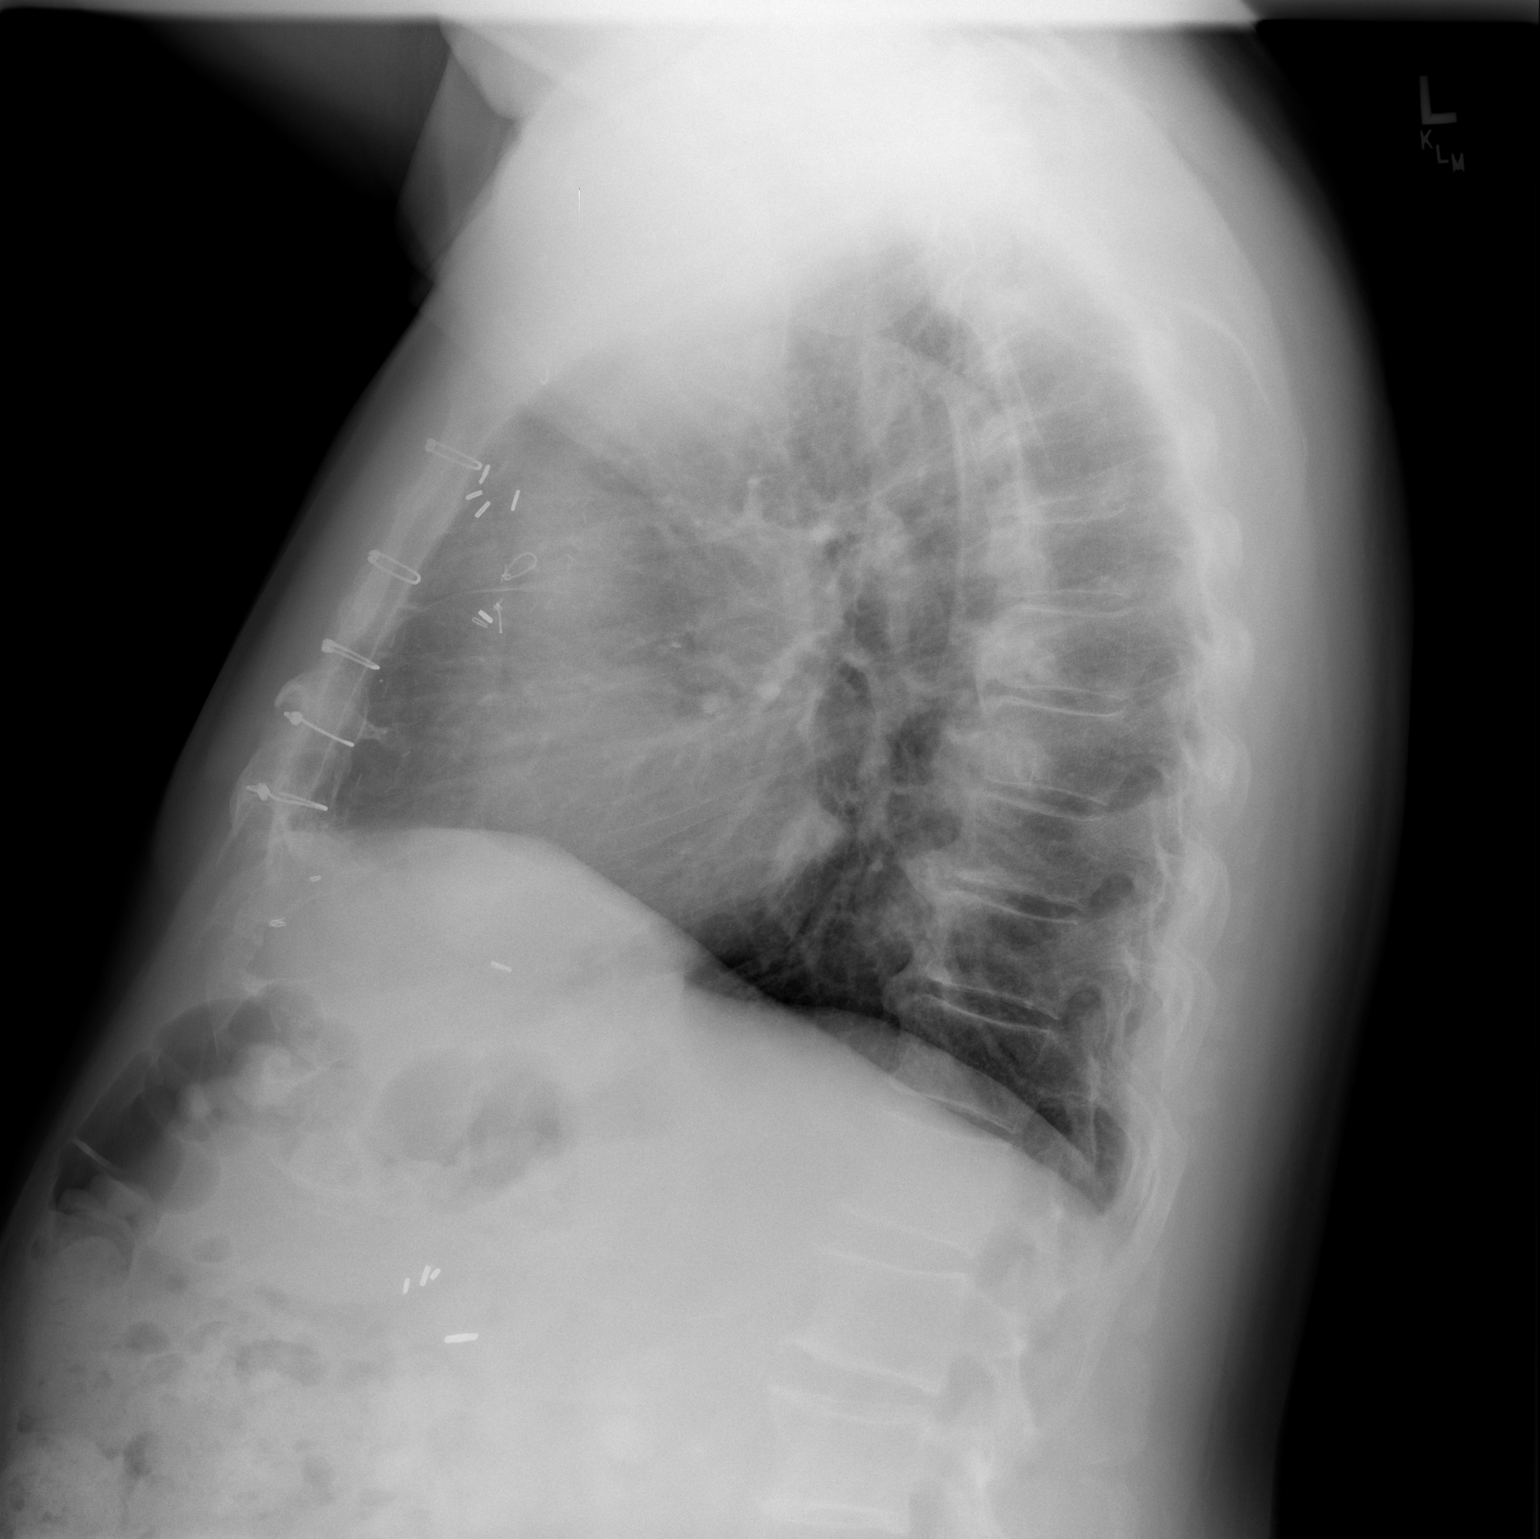

[2 of 2 positions shown; findings below may reference images not displayed]

FINDINGS: The cardiac silhouette, mediastinal and hilar contours
are within normal limits.  There are surgical changes from bypass
surgery.  The lungs are clear.  The bony thorax is intact.
IMPRESSION: No acute cardiopulmonary findings.

## 2009-07-25 ENCOUNTER — Inpatient Hospital Stay (HOSPITAL_COMMUNITY): Admission: RE | Admit: 2009-07-25 | Discharge: 2009-07-26 | Payer: Self-pay | Admitting: Cardiology

## 2009-07-25 HISTORY — PX: CORONARY STENT PLACEMENT: SHX1402

## 2009-07-31 ENCOUNTER — Telehealth: Payer: Self-pay | Admitting: Family Medicine

## 2009-07-31 ENCOUNTER — Emergency Department (HOSPITAL_COMMUNITY): Admission: EM | Admit: 2009-07-31 | Discharge: 2009-07-31 | Payer: Self-pay | Admitting: Emergency Medicine

## 2009-08-04 ENCOUNTER — Telehealth: Payer: Self-pay | Admitting: Family Medicine

## 2009-08-23 ENCOUNTER — Telehealth: Payer: Self-pay | Admitting: Family Medicine

## 2009-09-15 ENCOUNTER — Encounter: Payer: Self-pay | Admitting: Family Medicine

## 2009-09-20 ENCOUNTER — Encounter (INDEPENDENT_AMBULATORY_CARE_PROVIDER_SITE_OTHER): Payer: Self-pay | Admitting: *Deleted

## 2009-11-14 ENCOUNTER — Ambulatory Visit: Payer: Self-pay | Admitting: Cardiology

## 2009-12-20 ENCOUNTER — Ambulatory Visit: Payer: Self-pay | Admitting: Family Medicine

## 2009-12-26 ENCOUNTER — Ambulatory Visit: Payer: Self-pay | Admitting: Family Medicine

## 2009-12-26 ENCOUNTER — Encounter: Payer: Self-pay | Admitting: Internal Medicine

## 2009-12-26 ENCOUNTER — Emergency Department (HOSPITAL_COMMUNITY): Admission: EM | Admit: 2009-12-26 | Discharge: 2009-12-27 | Payer: Self-pay | Admitting: Emergency Medicine

## 2009-12-26 DIAGNOSIS — T17308A Unspecified foreign body in larynx causing other injury, initial encounter: Secondary | ICD-10-CM

## 2009-12-26 IMAGING — CR DG CHEST 2V
2 series · 2 of 2 positions shown · non-contrast
Comparison: [DATE]

CLINICAL DATA: Short of breath

CHEST - 2 VIEW

[w chest pa]
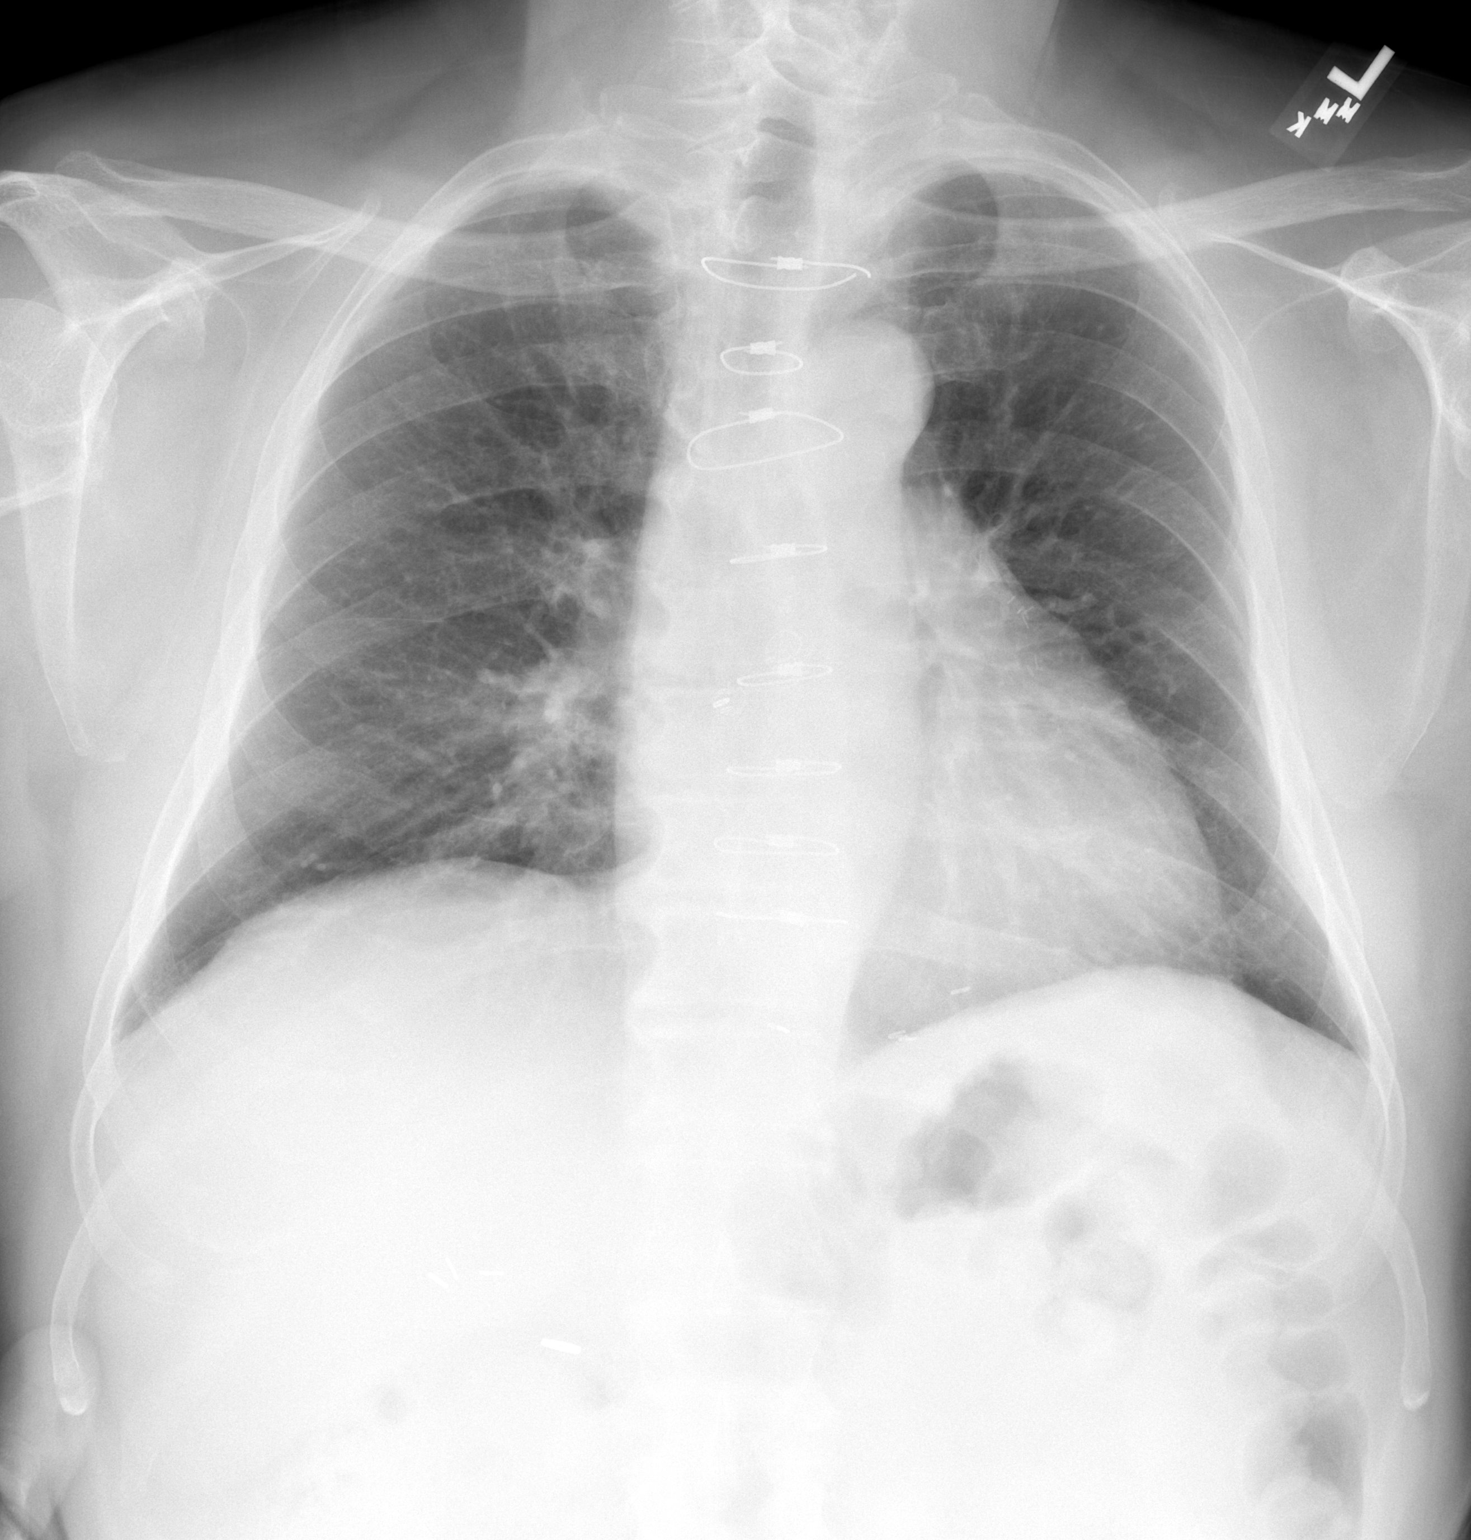

[w chest lat]
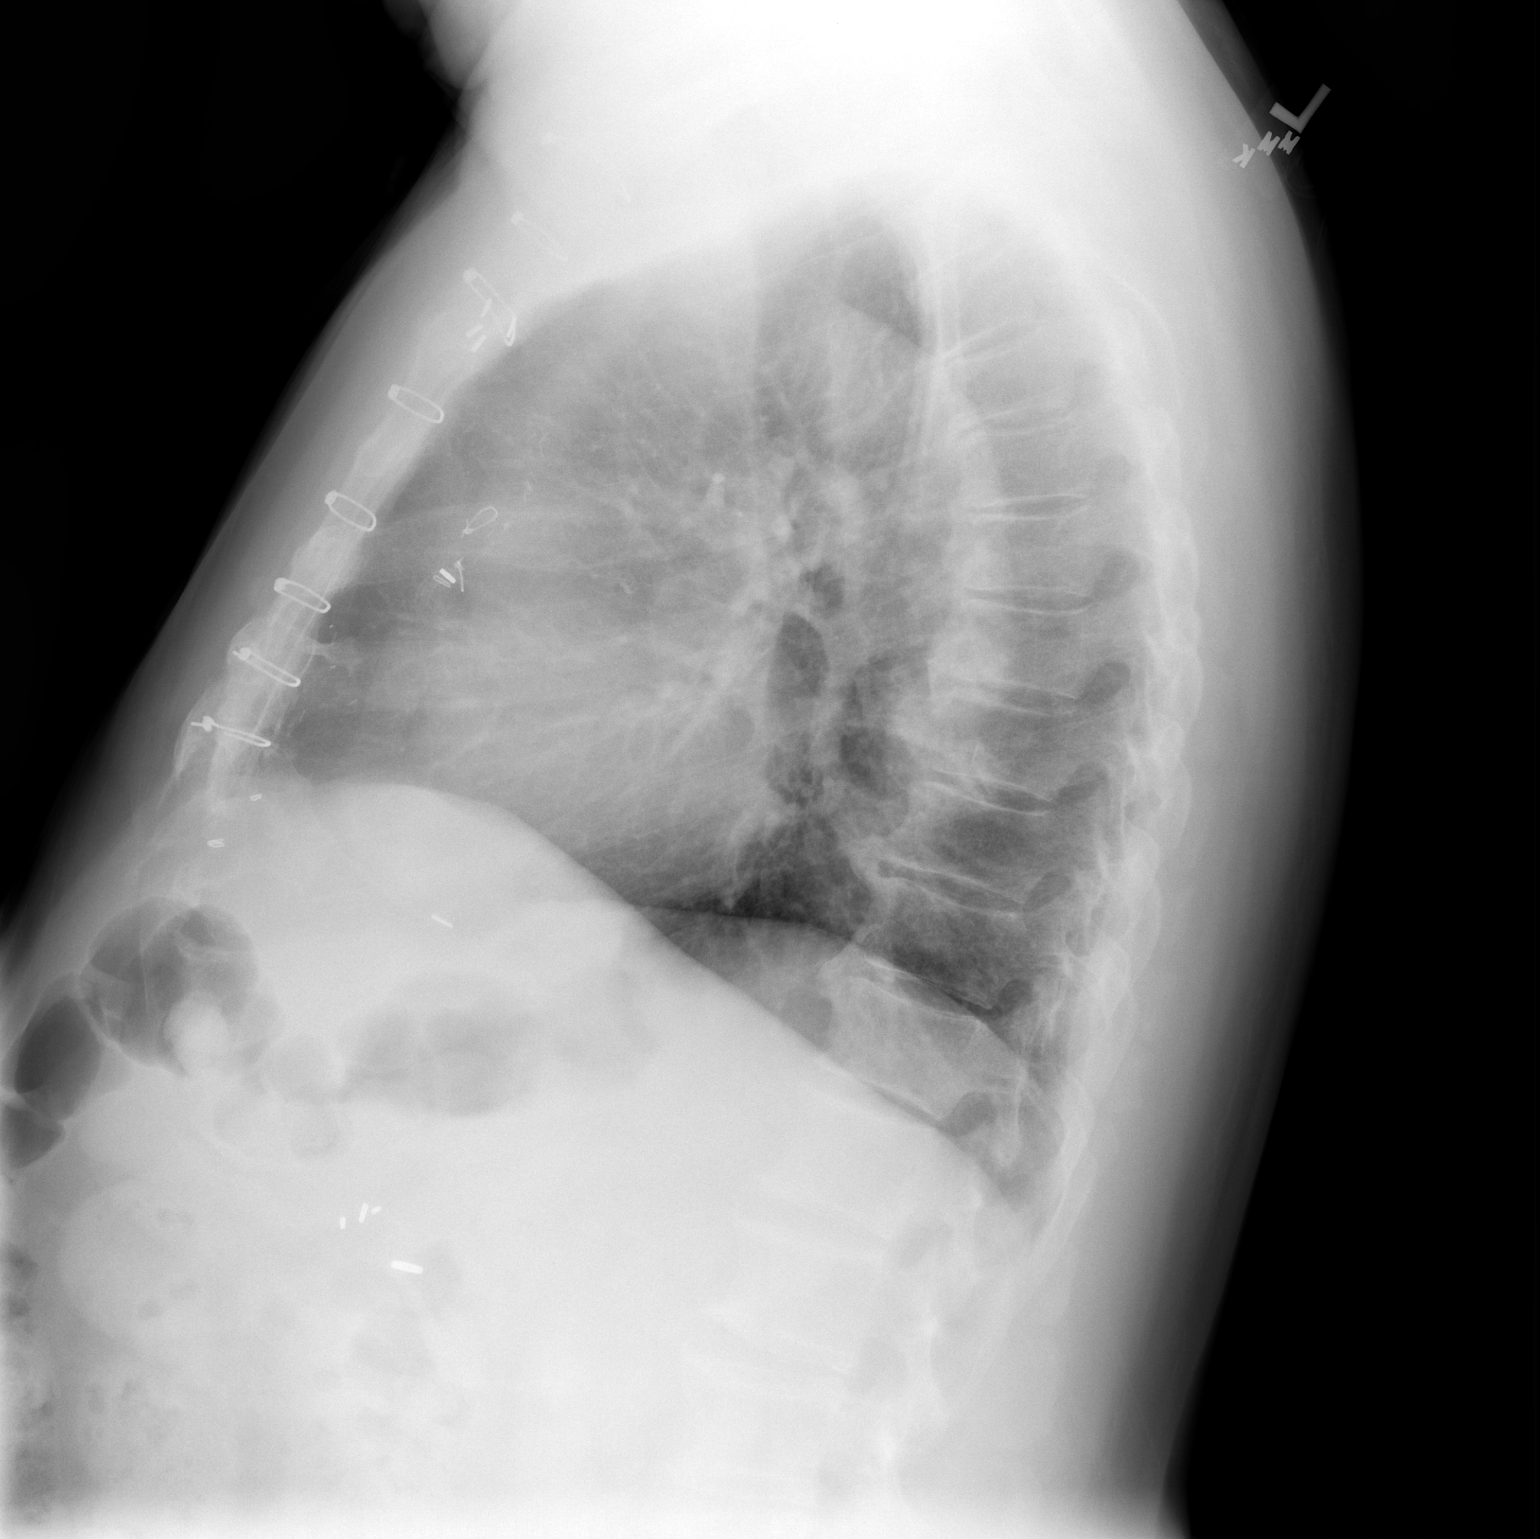

[2 of 2 positions shown; findings below may reference images not displayed]

FINDINGS: Heart is borderline enlarged.  Lungs are clear.
Pulmonary vascularity is within normal limits.  No pneumothorax or
pleural effusion.  Postoperative changes.
IMPRESSION: Cardiomegaly without pulmonary edema.

## 2010-01-01 ENCOUNTER — Telehealth: Payer: Self-pay | Admitting: Family Medicine

## 2010-01-04 ENCOUNTER — Telehealth: Payer: Self-pay | Admitting: Family Medicine

## 2010-01-08 ENCOUNTER — Ambulatory Visit: Payer: Self-pay | Admitting: Internal Medicine

## 2010-01-09 ENCOUNTER — Telehealth: Payer: Self-pay | Admitting: Family Medicine

## 2010-02-14 ENCOUNTER — Ambulatory Visit: Payer: Self-pay | Admitting: Cardiology

## 2010-03-16 ENCOUNTER — Ambulatory Visit: Payer: Self-pay | Admitting: Cardiology

## 2010-03-22 ENCOUNTER — Telehealth (INDEPENDENT_AMBULATORY_CARE_PROVIDER_SITE_OTHER): Payer: Self-pay | Admitting: *Deleted

## 2010-03-22 ENCOUNTER — Ambulatory Visit: Payer: No Typology Code available for payment source | Admitting: Nurse Practitioner

## 2010-03-22 NOTE — Letter (Signed)
Summary: Aaron Stafford letter  [facility]  [address] [place], [place] [address]   Phone: [phone]  Fax: [phone]       09/20/2009 MRN: [id number]  Aaron Stafford [address] [place], [place]  [address]  Dear [name] announce the retirement of Arta Silence, M.D., from full-time practice at the [facility] office effective August 17, 2009 and his plans of returning part-time.  It is important to Dr. Hetty Ely and to our practice that you understand that [facility] has seven physicians in our office for your health care needs.  We will continue to offer the same exceptional care that you have today.    Dr. Hetty Ely has spoken to many of you about his plans for retirement and returning part-time in the fall.   We will continue to work with you through the transition to schedule appointments for you in the office and meet the high standards that [facility] is committed to.   Again, it is with great pleasure that we share the news that Dr. Hetty Ely will return to [facility] at [facility] in October of 2011 with a reduced schedule.    If you have any questions, or would like to request an appointment with one of our physicians, please call us at [phone] and press the option for Scheduling an appointment.  We take pleasure in providing you with excellent patient care and look forward to seeing you at your next office visit.  Our [facility] Physicians are:  Tillman Abide, M.D. Laurita Quint, M.D. Roxy Manns, M.D. Kerby Nora, M.D. Hannah Beat, M.D. Ruthe Mannan, M.D. We proudly welcomed Raechel Ache, M.D. and Eustaquio Boyden, M.D. to the practice in July/August 2011.  Sincerely,  [facility]

## 2010-03-22 NOTE — Miscellaneous (Signed)
Summary: Flu/[facility]  Flu/[facility]   Imported By: Lanelle Bal 03/08/2009 10:03:22  _____________________________________________________________________  External Attachment:    Type:   Image     Comment:   External Document  Appended Document: Flu/[facility]    Clinical Lists Changes  Observations: Added new observation of FLU VAX: Fluvax 3+ (03/01/2009 17:29)       Influenza Immunization History:    Influenza # 1:  Fluvax 3+ (03/01/2009)

## 2010-03-22 NOTE — Progress Notes (Signed)
Summary: refill request for proscar  Phone Note Refill Request Message from:  Fax from Pharmacy  Refills Requested: Medication #1:  proscar 5 mg   Last Refilled: 05/25/2009 Faxed request from [facility], this is no longer on med list- was taken off because pt said he no longer took it (03/2009).  Initial call taken by: Lowella Petties CMA,  Jun 28, 2009 12:43 PM  Follow-up for Phone Call        Please let pharmacy know the pt told us he was no longer taking this as of last Feb and it has been taken off of his medication list here. Follow-up by: Shaune Leeks MD,  Jun 28, 2009 1:48 PM  Additional Follow-up for Phone Call Additional follow up Details #1::        Advised pharmacy, pt will call if questions. Additional Follow-up by: Lowella Petties CMA,  Jun 28, 2009 2:17 PM

## 2010-03-22 NOTE — Assessment & Plan Note (Signed)
Summary: 10:00 F/U/CLE   Vital Signs:  Patient profile:   76 year old male Weight:      201.25 pounds O2 Sat:      97 % on Room air Temp:     98.3 degrees F oral Pulse rate:   64 / minute Pulse rhythm:   regular BP sitting:   130 / 70  (left arm) Cuff size:   large  Vitals Entered By: Selena Batten Dance CMA (AAMA) (December 26, 2009 10:02 AM)  O2 Flow:  Room air CC: Recheck   History of Present Illness: CC: recheck cough  seen 6 d ago with 1wk h/o bad bronchitis, treated with zpack and feels doing better, lungs feel clear.  However, starting to have fits which start as scratchy feeling in back of throat, then tries to cough it up and gets choked, has trouble catching  breath, almost passed out from this.  Goes into gag reflex.  Happened 2 x yesterday, once last night, woke him up from sleep.  Feels phlegm in upper throat, staying there, unable to cough it up.  Feels like small amt dust in throat along with phlegm coming from sinuses.  This is very disconcerting to him.  h/o sinus cavity tear from dental procedure as child?  whenever he breathes dust it goes straight to throat and causes irritation.  Cough better however these episodes are very distressing.  on lisinopril for several years.  No dry cough.  no mucosal swelling.  Did allow one tessalon perl dissolve in back of tongue.  had tongue swelling, resolved.  this happened after episodes had started  No fever, ST, drooling.  + deeper voice, hoarse.  not muffled, no stridor.    Current Medications (verified): 1)  Metoprolol Tartrate 50 Mg Tabs (Metoprolol Tartrate) .... Take 1/2 By Mouth Two Times A Day 2)  Aspirin 325 Mg Tabs (Aspirin) .... One Daily 3)  Lisinopril 40 Mg Tabs (Lisinopril) .... Take One By Mouth Daily 4)  Hydrochlorothiazide 25 Mg Tabs (Hydrochlorothiazide) .... Take One By Mouth Daily 5)  Zetia 10 Mg Tabs (Ezetimibe) .... Take One By Mouth Daily 6)  Simvastatin 40 Mg Tabs (Simvastatin) .... Take One By Mouth  Daily 7)  Viagra 100 Mg Tabs (Sildenafil Citrate) .... One Tab By Mouth 1 Hour Prior To Desired Relatiuons. 8)  Alprazolam 0.25 Mg Tbdp (Alprazolam) .... One Tab By Mouth At Night, Max Three Times A Day As Needed Anxiety 9)  Metformin Hcl 500 Mg Tabs (Metformin Hcl) .... One Tab By Mouth At Night 10)  Ambien 10 Mg Tabs (Zolpidem Tartrate) .... Take 1/2-1 Tab  By Mouth At Bedtime As Needed 11)  Finasteride 5 Mg Tabs (Finasteride) .... Take One By Mouth Daily 12)  Tessalon Perles 100 Mg Caps (Benzonatate) .... One By Mouth Three Times A Day As Needed Cough 13)  Cheratussin Ac 100-10 Mg/74ml Syrp (Guaifenesin-Codeine) .... One Teaspoon Every 6hours As Needed Coughing  Allergies: 1)  Morphine Sulfate (Morphine Sulfate) 2)  Nitroglycerin  Past History:  Past Medical History: Last updated: 12/20/2009 CAD/MI with stents HTN HLD  Social History: Last updated: 07/01/2006 Marital Status: Married Children: 5 boys, out of home Occupation: Education officer, environmental  Review of Systems       per HPI  Physical Exam  General:  Well-developed,well-nourished,in no acute distress; alert,appropriate and cooperative throughout examination Head:  Normocephalic and atraumatic without obvious abnormalities. No apparent alopecia or balding. Mild thinning of hair in male pattern. Sinuses NT. Eyes:  Conjunctiva clear  bilaterally.  Ears:  External ear exam shows no significant lesions or deformities.  Otoscopic examination reveals clear canals, tympanic membranes are intact bilaterally without bulging, retraction, inflammation or discharge. Hearing is grossly normal bilaterally. Nose:  External nasal examination shows no deformity or inflammation. Nasal mucosa are pink and moist without lesions or exudates. Mouth:  no exudates, + some pharyngeal drainage.  no erythema Neck:  No deformities, masses, or tenderness noted. Lungs:  Normal respiratory effort, chest expands symmetrically. Lungs are clear to auscultation, no  crackles or wheezes. Heart:  Normal rate and regular rhythm. S1 and S2 normal without gallop, murmur, click, rub or other extra sounds. Pulses:  2+ rad pulses Extremities:  no pedal edema, brisk cap refill Additional Exam:  Did have episode while in clinic - clears throat, then stops breathing in attempt to bring up sputum, turns red from straining to gag.  Episode lasts  ~10 sec.  no cough.  no stridor   Impression & Recommendations:  Problem # 1:  CHOKING (ICD-933.1) sounds like phlegm from sinuses getting stuck somewhere in back of oropharynx causing choking sensation.  trial of antihistamine to dry sinuses.  given severity of episodes and causing distress referral to ENT for eval of choking episodes.  bronchitis resolving, minimal cough.  Orders: ENT Referral (ENT)  Complete Medication List: 1)  Metoprolol Tartrate 50 Mg Tabs (Metoprolol tartrate) .... Take 1/2 by mouth two times a day 2)  Aspirin 325 Mg Tabs (Aspirin) .... One daily 3)  Lisinopril 40 Mg Tabs (Lisinopril) .... Take one by mouth daily 4)  Hydrochlorothiazide 25 Mg Tabs (Hydrochlorothiazide) .... Take one by mouth daily 5)  Zetia 10 Mg Tabs (Ezetimibe) .... Take one by mouth daily 6)  Simvastatin 40 Mg Tabs (Simvastatin) .... Take one by mouth daily 7)  Viagra 100 Mg Tabs (Sildenafil citrate) .... One tab by mouth 1 hour prior to desired relatiuons. 8)  Alprazolam 0.25 Mg Tbdp (Alprazolam) .... One tab by mouth at night, max three times a day as needed anxiety 9)  Metformin Hcl 500 Mg Tabs (Metformin hcl) .... One tab by mouth at night 10)  Ambien 10 Mg Tabs (Zolpidem tartrate) .... Take 1/2-1 tab  by mouth at bedtime as needed 11)  Finasteride 5 Mg Tabs (Finasteride) .... Take one by mouth daily 12)  Tessalon Perles 100 Mg Caps (Benzonatate) .... One by mouth three times a day as needed cough 13)  Cheratussin Ac 100-10 Mg/24ml Syrp (Guaifenesin-codeine) .... One teaspoon every 6hours as needed coughing 14)  Claritin 10  Mg Tabs (Loratadine) .... One daily for allergies  Patient Instructions: 1)  we will send you to the ENT doctors for evaluation of your choking episodes.   2)  Tessalon perls - swallow don't chew. 3)  Start claritin daily. 4)  Good to see you today, I hope you start feeling better.   Orders Added: 1)  Est. Patient Level III [04540] 2)  ENT Referral [ENT]    Current Allergies (reviewed today): MORPHINE SULFATE (MORPHINE SULFATE) NITROGLYCERIN

## 2010-03-22 NOTE — Progress Notes (Signed)
Summary: refill request for alprazolam  Phone Note Refill Request Message from:  Fax from Pharmacy  Refills Requested: Medication #1:  ALPRAZOLAM 0.25 MG TBDP one tab by mouth three times a day as needed anxiety.   Last Refilled: 02/20/2009 Faxed request from [facility], phone [phone].  Initial call taken by: Lowella Petties CMA,  March 24, 2009 12:42 PM  Follow-up for Phone Call        will refil times one in Dr Kathie Rhodes absence Follow-up by: Judith Part MD,  March 24, 2009 1:44 PM  Additional Follow-up for Phone Call Additional follow up Details #1::        Called to [facility]. Additional Follow-up by: Lowella Petties CMA,  March 24, 2009 2:48 PM    Prescriptions: ALPRAZOLAM 0.25 MG TBDP (ALPRAZOLAM) one tab by mouth three times a day as needed anxiety  #30 x 0   Entered and Authorized by:   Judith Part MD   Signed by:   Judith Part MD on 03/24/2009   Method used:   Telephoned to ...         RxID:   [id number]

## 2010-03-22 NOTE — Progress Notes (Signed)
Summary: refill request for alprazolam  Phone Note Refill Request Call back at [phone] Message from:  Patient  Refills Requested: Medication #1:  ALPRAZOLAM 0.25 MG TBDP one tab by mouth at night Phoned request from pt.  Please send to [facility] in [place].  Pt knows he needs to make appt with someone here.  Initial call taken by: Lowella Petties CMA,  August 23, 2009 11:38 AM  Follow-up for Phone Call        Medication phoned to pharmacy. Patient Advised.  Follow-up by: Delilah Shan CMA Duncan Dull),  August 23, 2009 2:36 PM    Prescriptions: ALPRAZOLAM 0.25 MG TBDP (ALPRAZOLAM) one tab by mouth at night, max three times a day as needed anxiety  #50 x 0   Entered and Authorized by:   Crawford Givens MD   Signed by:   Crawford Givens MD on 08/23/2009   Method used:   Telephoned to ...         RxID:   [id number]

## 2010-03-22 NOTE — Progress Notes (Signed)
Summary: update  Phone Note Outgoing Call   Summary of Call: can we call patient for an update on choking episodes?  thanks. Initial call taken by: Eustaquio Boyden  MD,  January 09, 2010 8:06 AM  Follow-up for Phone Call        Spoke with patient. He said he went to see Dr. Sherene Sires this A.M. Dr. Sherene Sires told him to cancel the appt with the laryngologist. Dr. Sherene Sires believe it is GERD and placed pt on Prednisone, Prilosec and another GERD Rx. (He couldn't remember the name). He was very greatful that we called to check on him and he said he is going to transfer to you since Dr. Hetty Ely retired. He also said Happy Thanksgiving! Follow-up by: Janee Morn CMA Duncan Dull),  January 09, 2010 12:03 PM  Additional Follow-up for Phone Call Additional follow up Details #1::        thanks. Additional Follow-up by: Eustaquio Boyden  MD,  January 09, 2010 1:09 PM

## 2010-03-22 NOTE — Progress Notes (Signed)
Summary: Glucose Testing supplies ([facility])  Phone Note From Other Clinic Call back at [phone]   Caller: [facility] Call For: Dr. Hetty Ely Summary of Call: Received fax form for glucose testing supplies, form in your IN box please advise. Initial call taken by: Linde Gillis CMA Duncan Dull),  June 14, 2009 3:35 PM  Follow-up for Phone Call        Done. Follow-up by: Shaune Leeks MD,  June 14, 2009 4:12 PM  Additional Follow-up for Phone Call Additional follow up Details #1::        Form faxed back to [facility] at [phone]. Additional Follow-up by: Linde Gillis CMA [facility] Dull),  June 14, 2009 4:17 PM

## 2010-03-22 NOTE — Assessment & Plan Note (Signed)
Summary: follow up after labs/ feet   Vital Signs:  Patient profile:   76 year old male Weight:      207 pounds Temp:     98.3 degrees F oral Pulse rate:   72 / minute Pulse rhythm:   regular BP sitting:   110 / 60  (left arm) Cuff size:   large  Vitals Entered By: Sydell Axon LPN (Jul 03, 2009 9:06 AM) CC: Follow-up on diabetes, not sleeping good at night   History of Present Illness: Pt here for diabetes recheck. He is checking glucose. 121-126 in AM. He is not checking it all the time because he thought these nos were ok.  His feet have been hurting a lot and he got some diabetic shoes which has really helped with his pain. Lyrica has helped as well. He does not take it all the time. He is having a haarder and harder time sleeping. Took an Ambien last night and slept like a baby the entire night. We have talked about Ambien use for years and he is well aware of the self fulfilling need if takedn regularly. He is willing to accept that.  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  (ICD-272.4) 17)  Obesity  (ICD-278.00) 18)  Hypertension  (ICD-401.9) 19)  Diverticulosis, Colon Via Colonoscopy  (ICD-562.10) 20)  Coronary Artery Disease  (ICD-414.00)  Medications Prior to Update: 1)  Metoprolol Tartrate 50 Mg Tabs (Metoprolol  Tartrate) .... Take 1/2 By Mouth Two Times A Day 2)  Aspirin 81 Mg Tabs (Aspirin) .... Take One By Mouth Daily 3)  Lisinopril 40 Mg Tabs (Lisinopril) .... Take One By Mouth Daily 4)  Fish Oil 1000 Mg Caps (Omega-3 Fatty Acids) .... Take One By Mouth Daily Prn 5)  Hydrochlorothiazide 25 Mg Tabs (Hydrochlorothiazide) .... Take One By Mouth Daily 6)  Zetia 10 Mg Tabs (Ezetimibe) .... Take One By Mouth Daily 7)  Simvastatin 40 Mg Tabs (Simvastatin) .... Take One By Mouth Daily 8)  Viagra 100 Mg Tabs (Sildenafil Citrate) .... One Tab By Mouth 1 Hour Prior To Desired Relatiuons. 9)  Alprazolam 0.25 Mg Tbdp (Alprazolam) .... One Tab By Mouth At Night, Max Three Times A Day As Needed Anxiety 10)  Metformin Hcl 500 Mg Tabs (Metformin Hcl) .... One Tab By Mouth At Night 11)  Lyrica 50 Mg Caps (Pregabalin) .... One Tab By Mouth Three Times A Day  Allergies: 1)  Morphine Sulfate (Morphine Sulfate) 2)  Nitroglycerin  Physical Exam  General:  Well-developed,well-nourished,in no acute distress; alert,appropriate and cooperative throughout examination, obese. Head:  Normocephalic and atraumatic without obvious abnormalities. No apparent alopecia or balding. Mild thinning of hair in male pattern. Sinuses NT. Eyes:  Conjunctiva clear bilaterally.  Ears:  External ear exam shows no significant lesions or deformities.  Otoscopic examination reveals clear canals, tympanic membranes are intact bilaterally  without bulging, retraction, inflammation or discharge. Hearing is grossly normal bilaterally. Nose:  External nasal examination shows no deformity or inflammation. Nasal mucosa are pink and moist without lesions or exudates. Mouth:  Oral mucosa and oropharynx without lesions or exudates.  Teeth in good repair. Neck:  No deformities, masses, or tenderness noted. Lungs:  Normal respiratory effort, chest expands symmetrically. Lungs are clear to auscultation, no crackles or wheezes. Heart:  Normal rate and  regular rhythm. S1 and S2 normal without gallop, murmur, click, rub or other extra sounds.   Impression & Recommendations:  Problem # 1:  FOOT PAIN, BILATERAL (ICD-729.5) Assessment Improved Better with diabetic shoes and occas Lyrica. Cont.  Problem # 2:  INSOMNIA (ICD-780.52) Assessment: Deteriorated  Will go to regular Ambien....try 1/2 to start and may repeat if wakes prior to 2AM. His updated medication list for this problem includes:    Ambien 10 Mg Tabs (Zolpidem tartrate) .Marland Kitchen... Take 1/2-1 tab  by mouth at bedtime as needed  Discussed sleep hygiene.   Problem # 3:  DIABETES MELLITUS, TYPE II (ICD-250.00) Assessment: Unchanged  Go to 4 day progresssion on sugar monitoring...discussed and demo'd. Disussed analyzing. RTC with nos.  His updated medication list for this problem includes:    Aspirin 81 Mg Tabs (Aspirin) .Marland Kitchen... Take one by mouth daily    Lisinopril 40 Mg Tabs (Lisinopril) .Marland Kitchen... Take one by mouth daily    Metformin Hcl 500 Mg Tabs (Metformin hcl) ..... One tab by mouth at night  Labs Reviewed: Creat: 1.0 (05/23/2009)    Reviewed HgBA1c results: 6.2 (05/23/2009)  Problem # 4:  HYPERTENSION (ICD-401.9) Assessment: Improved Stable. Cont curr meds. His updated medication list for this problem includes:    Metoprolol Tartrate 50 Mg Tabs (Metoprolol tartrate) .Marland Kitchen... Take 1/2 by mouth two times a day    Lisinopril 40 Mg Tabs (Lisinopril) .Marland Kitchen... Take one by mouth daily    Hydrochlorothiazide 25 Mg Tabs (Hydrochlorothiazide) .Marland Kitchen... Take one by mouth daily  BP today: 110/60 Prior BP: 130/74 (05/25/2009)  Labs Reviewed: K+: 4.4 (05/23/2009) Creat: : 1.0 (05/23/2009)   Chol: 114 (04/07/2009)   HDL: 39.70 (04/07/2009)   LDL: 38 (04/07/2009)   TG: 182.0 (04/07/2009)  Complete Medication List: 1)  Metoprolol Tartrate 50 Mg Tabs (Metoprolol tartrate) .... Take 1/2 by mouth two times a day 2)  Aspirin 81 Mg Tabs (Aspirin) .... Take one by mouth daily 3)  Lisinopril 40 Mg  Tabs (Lisinopril) .... Take one by mouth daily 4)  Fish Oil 1000 Mg Caps (Omega-3 fatty acids) .... Take one by mouth daily prn 5)  Hydrochlorothiazide 25 Mg Tabs (Hydrochlorothiazide) .... Take one by mouth daily 6)  Zetia 10 Mg Tabs (Ezetimibe) .... Take one by mouth daily 7)  Simvastatin 40 Mg Tabs (Simvastatin) .... Take one by mouth daily 8)  Viagra 100 Mg Tabs (Sildenafil citrate) .... One tab by mouth 1 hour prior to desired relatiuons. 9)  Alprazolam 0.25 Mg Tbdp (Alprazolam) .... One tab by mouth at night, max three times a day as needed anxiety 10)  Metformin Hcl 500 Mg Tabs (Metformin hcl) .... One tab by mouth at night 11)  Lyrica 50 Mg Caps (Pregabalin) .... One tab by mouth three times a day 12)  Ambien 10 Mg Tabs (Zolpidem tartrate) .... Take 1/2-1 tab  by mouth at bedtime as needed 13)  Finasteride 5 Mg Tabs (Finasteride) .... Take one by mouth daily  Patient Instructions: 1)  Start Ambien 5mg  at night. 2)  Start  4 day progression.  3)  Call in Jun for appt in Oct with A1C prior Prescriptions: LISINOPRIL 40 MG TABS (LISINOPRIL) Take one by mouth daily  #30 x 12   Entered and Authorized by:   Shaune Leeks MD   Signed by:   Shaune Leeks MD on 07/03/2009   Method used:   Electronically to        [facility]* (retail)       [address] [place], [place]  [address]       Ph: [phone]       Fax: [phone]   RxID:   [id number] AMBIEN 10 MG TABS (ZOLPIDEM TARTRATE) Take 1/2-1 tab  by mouth at bedtime as needed  #30 x 5   Entered and Authorized by:   Shaune Leeks MD   Signed by:   Shaune Leeks MD on 07/03/2009   Method used:   Print then Give to Patient   RxID:   [id number]   Current Allergies (reviewed today): MORPHINE SULFATE (MORPHINE SULFATE) NITROGLYCERIN

## 2010-03-22 NOTE — Medication Information (Signed)
Summary: Considerations for Review/[facility]  Considerations for Review/[facility]   Imported By: Maryln Gottron 09/15/2009 14:26:34  _____________________________________________________________________  External Attachment:    Type:   Image     Comment:   External Document

## 2010-03-22 NOTE — Letter (Signed)
Summary: [facility] l[facility]  Dr.Chapman [facility],Note   Imported By: Beau Fanny 01/02/2010 08:20:15  _____________________________________________________________________  External Attachment:    Type:   Image     Comment:   External Document

## 2010-03-22 NOTE — Progress Notes (Signed)
Summary: refill request for ambien  Phone Note Refill Request Message from:  Fax from Pharmacy  Refills Requested: Medication #1:  AMBIEN 10 MG TABS Take 1/2-1 tab  by mouth at bedtime as needed   Last Refilled: 03/18/2009 Faxed request from [facility], [phone].  Request is for one tablet at night as needed.  Initial call taken by: Lowella Petties CMA,  August 04, 2009 2:19 PM  Follow-up for Phone Call        Medication phoned to [facility] as instructed. Lewanda Rife LPN  August 04, 2009 2:48 PM     Prescriptions: AMBIEN 10 MG TABS (ZOLPIDEM TARTRATE) Take 1/2-1 tab  by mouth at bedtime as needed  #30 x 5   Entered and Authorized by:   Ruthe Mannan MD   Signed by:   Ruthe Mannan MD on 08/04/2009   Method used:   Telephoned to ...         RxID:   [id number]

## 2010-03-22 NOTE — Assessment & Plan Note (Signed)
Summary: Pulmonary/ new pt eval for uacs off ace, try ppi next   Visit Type:  Initial Consult Copy to:  Dr. Carl Best Primary Provider/Referring Provider:  Dr. Carl Best  CC:  Cough.  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   some better with tessalon, never tried inhalers, not sob unless coughing  Pt denies any significant sore throat, dysphagia, itching, sneezing,  nasal congestion or excess secretions,  fever, chills, sweats, unintended wt loss, pleuritic or exertional cp, hempoptysis, change in activity tolerance  orthopnea pnd or leg swelling Pt also denies any obvious fluctuation in symptoms with weather or environmental change or other alleviating or aggravating factors.       Current Medications (verified): 1)  Metoprolol Tartrate 50 Mg Tabs (Metoprolol Tartrate) .... Take 1/2 By Mouth Two Times A Day 2)  Aspirin 325 Mg Tabs (Aspirin) .... One Daily 3)  Zetia 10 Mg Tabs (Ezetimibe) .... Take One By Mouth Daily 4)  Simvastatin 40 Mg Tabs (Simvastatin) .... Take One By Mouth Daily 5)  Viagra 100 Mg Tabs (Sildenafil Citrate) .... One Tab By Mouth 1 Hour Prior To Desired Relatiuons. 6)  Metformin Hcl 500 Mg Tabs (Metformin Hcl) .... One Tab By Mouth At Night 7)  Ambien 10 Mg Tabs (Zolpidem Tartrate) .... Take 1/2-1 Tab  By Mouth At Bedtime As Needed 8)  Finasteride 5 Mg Tabs (Finasteride) .... Take One By Mouth Daily 9)  Tessalon Perles 100 Mg Caps (Benzonatate) .... One By Mouth Three Times A Day As Needed Cough 10)  Cheratussin Ac 100-10 Mg/79ml Syrp (Guaifenesin-Codeine) .... One Teaspoon Every 6hours As Needed Coughing 11)   Zyrtec Allergy 10 Mg Caps (Cetirizine Hcl) .Marland Kitchen.. 1 Once Daily As Needed 12)  Diazepam 2 Mg Tabs (Diazepam) .Marland Kitchen.. 1 Up To Three Times A Day As Needed 13)  Amlodipine Besylate 5 Mg Tabs (Amlodipine Besylate) .Marland Kitchen.. 1 Once Daily  Allergies (verified): 1)  Morphine Sulfate (Morphine Sulfate) 2)  Nitroglycerin  Past History:  Past Medical History: CAD/MI with stents HTN HLD Chronic rhinitis..............................................[facility] [place]       Social History: Marital Status: Married Children: 5 boys, out of home Occupation: Education officer, environmental Never smoker No ETOH  Review of Systems       The patient complains of productive cough.  The patient denies shortness of breath with activity, shortness of breath at rest, non-productive cough, coughing up blood, chest pain, irregular heartbeats, acid heartburn, indigestion, loss of appetite, weight change, abdominal pain, difficulty swallowing, sore throat, tooth/dental problems, headaches, nasal congestion/difficulty breathing through nose, sneezing, itching, ear ache, anxiety, depression, hand/feet swelling, joint stiffness or pain, rash, change in color of mucus, and fever.    Vital Signs:  Patient profile:   76 year old male Weight:      206.38 pounds O2 Sat:      95 % on Room air Temp:     98.4 degrees F oral Pulse rate:   61 / minute BP sitting:   140 / 80  (left arm)  Vitals Entered By: Vernie Murders (January 08, 2010 10:31 AM)  O2 Flow:  Room air  Physical Exam  Additional Exam:  wt 201 > 206 January 09, 2010 amb hoarse wm nad HEENT: nl dentition, turbinates, and orophanx. Nl external ear canals without cough reflex NECK :  without JVD/Nodes/TM/ nl carotid upstrokes bilaterally LUNGS: no acc muscle use, clear to A and P bilaterally without cough on insp or exp maneuvers CV:  RRR  no s3 or murmur or increase in P2, no edema   ABD:  soft and nontender with nl excursion in the supine position. No bruits or organomegaly, bowel  sounds nl MS:  warm without deformities, calf tenderness, cyanosis or clubbing SKIN: warm and dry without lesions   NEURO:  alert, approp, no deficits     Impression & Recommendations:  Problem # 1:  COUGH (ICD-786.2)  The most common causes of chronic cough in immunocompetent adults include: upper airway cough syndrome (UACS), previously referred to as postnasal drip syndrome,  caused by variety of rhinosinus conditions; (2) asthma; (3) GERD; (4) chronic bronchitis from cigarette smoking or other inhaled environmental irritants; (5) nonasthmatic eosinophilic bronchitis; and (6) bronchiectasis. These conditions, singly or in combination, have accounted for up to 94% of the causes of chronic cough in prospective studies.   Cough to point of choking while on ACE is a  Classic Upper airway cough syndrome, so named because it's frequently impossible to sort out how much is  CR/sinusitis with freq throat clearing (which can be related to primary GERD)   vs  causing  secondary extra esophageal GERD from wide swings in gastric pressure that occur with throat clearing, promoting self use of mint and menthol lozenges that reduce the lower esophageal sphincter tone and exacerbate the problem further These are the same pts who not infrequently have failed to tolerate ace inhibitors,  dry powder inhalers or biphosphonates or report having reflux symptoms that don't respond to standard doses of PPI  Thus he can't tol ace needs to wait another month for this effect to washout and empically add high dose ppi next  The standardized cough guidelines recently published in Chest are a 14 step process, not a single office visit,  and are intended  to address this problem logically,  with an alogrithm dependent on response to each progressive step  to determine a specific diagnosis with  minimal addtional testing needed. Therefore if compliance is an issue this empiric standardized approach simply won't work.    Problem # 2:  HYPERTENSION (ICD-401.9) ACE inhibitors are problematic in  pts with airway complaints because  even experienced pulmonologists can't always distinguish ace effects from copd/asthma.  By themselves they don't actually cause a problem, much like oxygen can't by itself start a fire, but they certainly serve as a powerful catalyst or enhancer for any "fire"  or inflammatory process in the upper airway, be it caused by an ET  tube or more commonly reflux (especially in the obese or pts with known GERD or who are on biphoshonates).  In the era of ARB near equivalency until we have a better handle on the reversibility of the airway problem, it just makes sense to avoid ace entirely in the short run and then decide later, having established a level of airway control using a reasonable limited regimen, whether to add back ace but even then being very careful to observe the pt for worsening airway control and number of meds used/ needed to control symptoms.    Medications Added to Medication List This Visit: 1)  Zyrtec Allergy 10 Mg Caps (  Cetirizine hcl) .Marland Kitchen.. 1 once daily as needed 2)  Diazepam 2 Mg Tabs (Diazepam) .Marland Kitchen.. 1 up to three times a day as needed 3)  Amlodipine Besylate 5 Mg Tabs (Amlodipine besylate) .Marland Kitchen.. 1 once daily 4)  Prilosec Otc 20 Mg Tbec (Omeprazole magnesium) .... Take one 30-60 min before first and last meals of the day 5)  Pepcid 20 Mg Tabs (Famotidine) .... Take one by mouth at bedtime 6)  Prednisone 10 Mg Tabs (Prednisone) .... 4 each am x 2days, 2x2days, 1x2days and stop  Other Orders: New Patient Level V ([address]) Prescription Created Electronically [id number])  Patient Instructions: 1)  GERD (REFLUX)  is a common cause of respiratory symptoms. It commonly presents without heartburn and can be treated with medication, but also with lifestyle changes including avoidance of late meals, excessive alcohol, smoking cessation, and avoid fatty foods, chocolate, peppermint, colas,  red wine, and acidic juices such as orange juice. NO MINT OR MENTHOL PRODUCTS SO NO COUGH DROPS  2)  USE SUGARLESS CANDY INSTEAD (jolley ranchers)  3)  NO OIL BASED VITAMINS 4)  Prednisone 4 each am x 2days, 2x2days, 1x2days and stop  5)  Prilosec 20 mg Take one 30-60 min before first and last meals of the day and pepcid 20 mg one at bedtime 6)  Return here in one month if not 100% satisfied  Prescriptions: PREDNISONE 10 MG  TABS (PREDNISONE) 4 each am x 2days, 2x2days, 1x2days and stop  #14 x 0   Entered and Authorized by:   Nyoka Cowden MD   Signed by:   Nyoka Cowden MD on 01/08/2010   Method used:   Electronically to        [facility]* (retail)       [address] [place], [place]  [address]       Ph: [phone]       Fax: [phone]   RxID:   [id number]

## 2010-03-22 NOTE — Progress Notes (Signed)
Summary: form for diabetic shoes  Phone Note Other Incoming   Caller: [facility] Summary of Call: Form for diabetic shoes is on your shelf. Initial call taken by: Lowella Petties CMA,  June 01, 2009 12:07 PM  Follow-up for Phone Call        Signed. Follow-up by: Shaune Leeks MD,  June 01, 2009 1:31 PM  Additional Follow-up for Phone Call Additional follow up Details #1::        Form faxed.             Lowella Petties CMA  June 01, 2009 3:20 PM

## 2010-03-22 NOTE — Assessment & Plan Note (Signed)
Summary: TOES BURNING/STINGING/RBH   Vital Signs:  Patient profile:   76 year old male Weight:      213.25 pounds BMI:     33.03 Temp:     98.2 degrees F oral Pulse rate:   60 / minute Pulse rhythm:   regular BP sitting:   130 / 74  (left arm) Cuff size:   large  Vitals Entered By: Linde Gillis CMA Duncan Dull) (February 20, 2009 4:12 PM) CC: toes burning and stinging   History of Present Illness: One month ago starting using Vicks for fungal infection of the toes. He has pain of the toes now at night with tingling. He is concerned that this could be a manifestation of diabetes. Tingling started a wekk or so after starting the Vicks, which he has been spreading on the toes as well as the nails. Also having signif diff with sleeping at night. Gets to sleep fine but wakes up a few hrs later and then has diff getting back to sleep. Has used Ambien sparingly at first but every night the last few weeks.  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  (ICD-272.4) 16)  Obesity  (ICD-278.00) 17)  Hypertension  (ICD-401.9) 18)  Diverticulosis, Colon Via Colonoscopy  (ICD-562.10) 19)  Coronary Artery Disease  (ICD-414.00)  Medications Prior to Update: 1)  Metoprolol Tartrate 50 Mg Tabs (Metoprolol Tartrate) .... Take 1/2 By Mouth Bid 2)  Aspirin 81 Mg Tabs (Aspirin) .... Take One By  Mouth Daily 3)  Lisinopril 40 Mg Tabs (Lisinopril) .... Take One By Mouth Daily 4)  Fish Oil 1000 Mg Caps (Omega-3 Fatty Acids) .... Take One By Mouth Daily Prn 5)  Hydrochlorothiazide 25 Mg Tabs (Hydrochlorothiazide) .... Take One By Mouth Daily 6)  Zetia 10 Mg Tabs (Ezetimibe) .... Take One By Mouth Daily 7)  Simvastatin 40 Mg Tabs (Simvastatin) .... Take One By Mouth Daily 8)  Zolpidem Tartrate 10 Mg Tabs (Zolpidem Tartrate) .... Take 1 By Mouth Q Hs Prn 9)  Viagra 100 Mg Tabs (Sildenafil Citrate) .... One Tab By Mouth 1 Hour Prior To Desired Relatiuons. 10)  Bentyl 10 Mg Caps (Dicyclomine Hcl) .... One Tab By Mouth Four Times A Day Only As Needed 11)  Flexeril 10 Mg  Tabs (Cyclobenzaprine Hcl) .... One Tab By Mouth Three Times A Day Only As Needed 12)  Tylenol Extra Strength 500 Mg  Tabs (Acetaminophen) .... 2 Tablets Prn 13)  Zovirax 800 Mg  Tabs (Acyclovir) .... One Tab By Mouth Qid For 5days Only As Needed 14)  Detrol La 4 Mg Xr24h-Cap (Tolterodine Tartrate) .... One Tab By Mouth Qd 15)  Proscar 5 Mg Tabs (Finasteride) .... One Tab By Mouth Qd 16)  Calcitriol 1 Mcg/ml Soln (Calcitriol) .... One Tab By Mouth Once A Week 17)  Drisdol 11914 Unit Caps (Ergocalciferol) .... One Tab By Mouth Every Week 18)  Lovaza 1 Gm  Caps (Omega-3-Acid Ethyl Esters) .... 2 Capsules Twice A Day By Mouth 19)  Cipro 500 Mg Tabs (Ciprofloxacin Hcl) .... One Tab By Mouth Two Times A Day 20)  Flomax 0.4 Mg Xr24h-Cap (Tamsulosin Hcl) .... One Tab By Mouth Once Daily. 21)  Pyridium 200 Mg Tabs (Phenazopyridine Hcl) .... One Tab By Mouth Three Times A Day 22)  Alprazolam 0.25 Mg Tbdp (Alprazolam) .... One Tab By Mouth Three Times A Day As Needed Anxiety  Allergies: 1)  Morphine Sulfate (Morphine Sulfate) 2)  Nitroglycerin  Physical Exam  General:  Well-developed,well-nourished,in no acute distress; alert,appropriate and cooperative throughout examination, mildly obese. Describes episodes of acute pain in  urethra and lower abd/ suprapubic area. Head:  Normocephalic and atraumatic without obvious abnormalities. No apparent alopecia or balding. Mild thinning of hair in male pattern. Eyes:  Conjunctiva clear bilaterally.  Ears:  External ear exam shows no significant lesions or deformities.  Otoscopic examination reveals clear canals, tympanic membranes are intact bilaterally without bulging, retraction, inflammation or discharge. Hearing is grossly normal bilaterally. Nose:  External nasal examination shows no deformity or inflammation. Nasal mucosa are pink and moist without lesions or exudates. Mouth:  Oral mucosa and oropharynx without lesions or exudates.  Teeth in good repair. Extremities:  Feet with thickened white nails bilat, esp gt nails. Pulses and warmth nml.    Impression & Recommendations:  Problem # 1:  TINGLING OF TOES (ICD-782.0) Assessment New Presumed reaction to Vicke to skin chronically. Hold Viks. Use Eucerin to skin for now.  Problem # 2:  INSOMNIA (ICD-780.52) Assessment: Deteriorated  Stop Ambien. Read hygien H/O in office. Trial of Xanax at night. RTC 2 weeks. His updated medication list for this problem includes:    Zolpidem Tartrate 10 Mg Tabs (Zolpidem tartrate) .Marland Kitchen... Take 1 by mouth q hs prn  Discussed sleep hygiene.   Complete Medication List: 1)  Metoprolol Tartrate 50 Mg Tabs (Metoprolol tartrate) .... Take 1/2 by mouth bid 2)  Aspirin 81 Mg Tabs (Aspirin) .... Take one by mouth daily 3)  Lisinopril 40 Mg Tabs (Lisinopril) .... Take one by mouth daily 4)  Fish Oil 1000 Mg Caps (Omega-3 fatty acids) .... Take one by mouth daily prn 5)  Hydrochlorothiazide 25 Mg Tabs (Hydrochlorothiazide) .... Take one by mouth daily 6)  Zetia 10 Mg Tabs (Ezetimibe) .... Take one by mouth daily 7)  Simvastatin 40 Mg Tabs (Simvastatin) .... Take one by mouth daily 8)  Zolpidem Tartrate 10 Mg Tabs (Zolpidem tartrate) .... Take 1 by mouth q hs prn 9)  Viagra 100 Mg Tabs  (Sildenafil citrate) .... One tab by mouth 1 hour prior to desired relatiuons. 10)  Bentyl 10 Mg Caps (Dicyclomine hcl) .... One tab by mouth four times a day only as needed 11)  Zovirax 800 Mg Tabs (Acyclovir) .... One tab by mouth qid for 5days only as needed 12)  Detrol La 4 Mg Xr24h-cap (Tolterodine tartrate) .... One tab by mouth qd 13)  Proscar 5 Mg Tabs (Finasteride) .... One tab by mouth qd 14)  Calcitriol 1 Mcg/ml Soln (Calcitriol) .... One tab by mouth once a week 15)  Drisdol 65784 Unit Caps (Ergocalciferol) .... One tab by mouth every week 16)  Lovaza 1 Gm Caps (Omega-3-acid ethyl esters) .... 2 capsules twice a day by mouth 17)  Flomax 0.4 Mg Xr24h-cap (Tamsulosin hcl) .... One tab by mouth once daily. 18)  Pyridium 200 Mg Tabs (Phenazopyridine hcl) .... One tab by mouth three times a day  19)  Alprazolam 0.25 Mg Tbdp (Alprazolam) .... One tab by mouth three times a day as needed anxiety  Patient Instructions: 1)  RTC in 2 weeks. Prescriptions: ALPRAZOLAM 0.25 MG TBDP (ALPRAZOLAM) one tab by mouth three times a day as needed anxiety  #30 x 0   Entered and Authorized by:   Shaune Leeks MD   Signed by:   Shaune Leeks MD on 02/20/2009   Method used:   Print then Give to Patient   RxID:   [id number]   Current Allergies (reviewed today): MORPHINE SULFATE (MORPHINE SULFATE) NITROGLYCERIN

## 2010-03-22 NOTE — Progress Notes (Signed)
Summary: pain in feet  Phone Note Call from Patient Call back at Home Phone [phone]   Caller: Patient Call For: Shaune Leeks MD Summary of Call: Patient was diagnosed with diabetes and says that he had discussed the pain that he has been having in his feet with you. He says that now the pain has gotten worse and is especially bad at night making it hard to sleep. He says that pain shoots up through his toes. He wants to know if he can have something called in to [facility] in [place].  Initial call taken by: Melody Comas,  June 07, 2009 2:08 PM  Follow-up for Phone Call        Will try Lyrica. Scriopt sent in. See me in 2 weeks. Follow-up by: Shaune Leeks MD,  June 07, 2009 4:47 PM  Additional Follow-up for Phone Call Additional follow up Details #1::        Patient notified, appt. scheduled for Monday 07-03-09.  Additional Follow-up by: Melody Comas,  June 07, 2009 5:02 PM    New/Updated Medications: LYRICA 50 MG CAPS (PREGABALIN) one tab by mouth three times a day Prescriptions: LYRICA 50 MG CAPS (PREGABALIN) one tab by mouth three times a day  #90 x 5   Entered and Authorized by:   Shaune Leeks MD   Signed by:   Melody Comas on 06/07/2009   Method used:   Telephoned to ...       [facility]* (retail)       [address] [place], [place]  [address]       Ph: [phone]       Fax: [phone]   RxID:   [id number]   Appended Document: pain in feet Called lyrica [facility].  It had not been called in.

## 2010-03-22 NOTE — Letter (Signed)
Summary: [facility]-Statement of Certifying Physician for  [facility]-Statement of Certifying Physician for Therapeutic Shoes   Imported By: Beau Fanny 06/01/2009 15:30:55  _____________________________________________________________________  External Attachment:    Type:   Image     Comment:   External Document

## 2010-03-22 NOTE — Progress Notes (Signed)
Summary: refill request for finasteride  Phone Note Refill Request Message from:  Fax from Pharmacy  Refills Requested: Medication #1:  FINASTERIDE 5 MG TABS Take one by mouth daily.   Last Refilled: 06/28/2009 Faxed request from [facility], [phone].  Initial call taken by: Lowella Petties CMA,  July 31, 2009 3:10 PM    Prescriptions: FINASTERIDE 5 MG TABS (FINASTERIDE) Take one by mouth daily  #30 x 12   Entered and Authorized by:   Shaune Leeks MD   Signed by:   Shaune Leeks MD on 07/31/2009   Method used:   Electronically to        [facility]* (retail)       [address] [place], [place]  [address]       Ph: [phone]       Fax: [phone]   RxID:   [id number]

## 2010-03-22 NOTE — Progress Notes (Signed)
Summary: f/u ENT  ---- Converted from flag ---- ---- 01/04/2010 9:17 AM, Selena Batten Dance CMA (AAMA) wrote: Aaron Stafford with patient. He has not seen a Scientist, forensic yet. He said Dr. Jenne Campus told him to keep the appointment with Dr. Sherene Sires because he could get in with him sooner than with anyone else. So he is keeping that appointment. He does however need a refill of the Cheratussin sent to [place] if that's okay.   ---- 01/02/2010 10:41 AM, Eustaquio Boyden  MD wrote: can we call pt Thursday to see if saw laryngologist and what outcome was?  and whether we need to cancel pulm? ------------------------------  Appended Document: f/u ENT ok to call in some more cheratussin. Eustaquio Boyden  MD  January 04, 2010 9:31 AM   Called into [address] Drug as directed. Patient aware. Kim Dance CMA (AAMA)  January 04, 2010 10:00 AM   Clinical Lists Changes  Medications: Rx of CHERATUSSIN AC 100-10 MG/5ML SYRP (GUAIFENESIN-CODEINE) one teaspoon every 6hours as needed coughing;  #100cc x 0;  Signed;  Entered by: Eustaquio Boyden  MD;  Authorized by: Eustaquio Boyden  MD;  Method used: Telephoned to [facility] [address] [place], [place], [place]  [address], Ph: [phone], Fax: [phone]    Prescriptions: CHERATUSSIN AC 100-10 MG/5ML SYRP (GUAIFENESIN-CODEINE) one teaspoon every 6hours as needed coughing  #100cc x 0   Entered and Authorized by:   Eustaquio Boyden  MD   Signed by:   Eustaquio Boyden  MD on 01/04/2010   Method used:   Telephoned to ...       [facility]* (retail)       [address] [place], [place]  [address]       Ph: [phone]       Fax: [phone]   RxID:   [id number]

## 2010-03-22 NOTE — Assessment & Plan Note (Signed)
Summary: ROA 6 WKS CYD   Vital Signs:  Patient profile:   76 year old male Weight:      205.50 pounds Temp:     98.3 degrees F oral Pulse rate:   56 / minute Pulse rhythm:   regular BP sitting:   130 / 74  (right arm) Cuff size:   large  Vitals Entered By: Sydell Axon LPN (May 25, 5425 9:51 AM) CC: 6 Week follow-up after labs, feet are hurting a lot   History of Present Illness: Pt here for recheck to assess DM which was officially diagnosed last visit. He is doing ok with his sugars. He does not have a glucometer yet.  He is having shooting pains in his feet bilat when he is off of them., They do not bother him during the day while he is up and about. He wears what he thinks are supportive reasonable shoes with good room and reasonable toebox. He also relates forgetfulness....starts naming grandchildren takes a while, peoples names. He has no trouble with recall of what went on last night, events at his church or things going on there. Going next May to [place] for usual trip with his church.  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   (ICD-272.4) 16)  Obesity  (ICD-278.00) 17)  Hypertension  (ICD-401.9) 18)  Diverticulosis, Colon Via Colonoscopy  (ICD-562.10) 19)  Coronary Artery Disease  (ICD-414.00)  Medications Prior to Update: 1)  Metoprolol Tartrate 50 Mg Tabs (Metoprolol Tartrate) .... Take 1/2 By Mouth Two Times A Day 2)  Aspirin 81 Mg Tabs (Aspirin) .... Take One By Mouth Daily 3)  Lisinopril 40 Mg Tabs (Lisinopril) .... Take One By Mouth Daily 4)  Fish Oil 1000 Mg Caps (Omega-3 Fatty Acids) .... Take One By Mouth Daily Prn 5)  Hydrochlorothiazide 25 Mg Tabs (Hydrochlorothiazide) .... Take One By Mouth Daily 6)  Zetia 10 Mg Tabs (Ezetimibe) .... Take One By Mouth Daily 7)  Simvastatin 40 Mg Tabs (Simvastatin) .... Take One By Mouth Daily 8)  Viagra 100 Mg Tabs (Sildenafil Citrate) .... One Tab By Mouth 1 Hour Prior To Desired Relatiuons. 9)  Detrol La 4 Mg Xr24h-Cap (Tolterodine Tartrate) .... One Tab By Mouth Daily 10)  Pyridium 200 Mg Tabs (Phenazopyridine Hcl) .... One Tab By Mouth Three Times A Day 11)  Alprazolam 0.25 Mg Tbdp (Alprazolam) .... One Tab By Mouth Three Times A Day As Needed Anxiety 12)  Metformin Hcl 500 Mg Tabs (Metformin Hcl) .... One Tab By Mouth At Night  Allergies: 1)  Morphine Sulfate (Morphine Sulfate) 2)  Nitroglycerin  Physical Exam  General:  Well-developed,well-nourished,in  no acute distress; alert,appropriate and cooperative throughout examination, obese. Head:  Normocephalic and atraumatic without obvious abnormalities. No apparent alopecia or balding. Mild thinning of hair in male pattern. Sinuses NT. Eyes:  Conjunctiva clear bilaterally.  Ears:  External ear exam shows no significant lesions or deformities.  Otoscopic examination reveals clear canals, tympanic membranes are intact bilaterally without bulging, retraction, inflammation or discharge. Hearing is grossly normal bilaterally. Nose:  External nasal examination shows no deformity or inflammation. Nasal mucosa are pink  and moist without lesions or exudates. Mouth:  Oral mucosa and oropharynx without lesions or exudates.  Teeth in good repair. Neck:  No deformities, masses, or tenderness noted. Chest Wall:  No deformities, masses, tenderness or gynecomastia noted. Breasts:  No masses or gynecomastia noted Lungs:  Normal respiratory effort, chest expands symmetrically. Lungs are clear to auscultation, no crackles or wheezes. Heart:  Normal rate and regular rhythm. S1 and S2 normal without gallop, murmur, click, rub or other extra sounds. Abdomen:  Bowel sounds positive,abdomen soft and non-tender without masses, organomegaly or hernias noted. Protuberant. Extremities:  No clubbing, cyanosis, edema, or deformity noted with normal full range of motion of all joints.   Skin:  Intact without suspicious lesions or rashes, h/o prior melanoma.  Diabetes Management Exam:    Foot Exam (with socks and/or shoes not present):       Sensory-Pinprick/Light touch:          Left medial foot (L-4): normal          Left dorsal foot (L-5): normal          Left lateral foot (S-1): normal          Right medial foot (L-4): normal          Right dorsal foot (L-5): normal          Right lateral foot (S-1): normal       Sensory-Monofilament:          Left foot: normal          Right foot: normal       Inspection:          Left foot: normal          Right foot: normal       Nails:          Left foot: thickened          Right foot: thickened   Impression & Recommendations:  Problem # 1:  DIABETES MELLITUS, TYPE II (ICD-250.00) Assessment Unchanged A1C acceptable but am surprised not better. Get machine via vendor through Medicare and start checking sugar initially before brfst fasting. If nos greater than 140, start 1000 at night. See me again early Jun. Bring diary.    Aspirin 81 Mg Tabs (Aspirin) .Marland Kitchen... Take one by mouth daily    Lisinopril 40 Mg Tabs (Lisinopril) .Marland Kitchen... Take one by mouth daily    Metformin Hcl 500 Mg  Tabs (Metformin hcl) ..... One tab by mouth at night  Labs Reviewed: Creat: 1.0 (05/23/2009)    Reviewed HgBA1c results: 6.2 (05/23/2009)  Problem # 2:  FOOT PAIN, BILATERAL (ICD-729.5) Assessment: New Should be way too early to have diabetic neuropathy. Try getting shoes through Medicare, vendors names and nos given.  Problem # 3:  INSOMNIA (ICD-780.52) Assessment: Unchanged  Would rather he use one Xanax torelax and sleep at night than use Ambien regularly. Xanax will help the stress as well as the anxiety provoked difficulty sleeping. Script sent updated. Discussed  avoiding acceleration of use.   Orders: Prescription Created Electronically [phone])  Problem # 4:  SPECIAL SCREENING MALIG NEOPLASMS OTHER SITES (ICD-V76.49) Assessment: Unchanged Needs skin exam and was not impressed with thoroughness given last time. Told him I think he shoulkd discuss that with his Dermatologist and if still feels not done aapporpriately, I will refer elsewhere.  Problem # 5:  HYPERTENSION (ICD-401.9) Assessment: Unchanged Stable. Again discussed avoiding salt and cutting down to lose weight. His updated medication list for this problem includes:    Metoprolol Tartrate 50 Mg Tabs (Metoprolol tartrate) .Marland Kitchen... Take 1/2 by mouth two times a day    Lisinopril 40 Mg Tabs (Lisinopril) .Marland Kitchen... Take one by mouth daily    Hydrochlorothiazide 25 Mg Tabs (Hydrochlorothiazide) .Marland Kitchen... Take one by mouth daily  BP today: 130/74 Prior BP: 126/74 (04/12/2009)  Labs Reviewed: K+: 4.4 (05/23/2009) Creat: : 1.0 (05/23/2009)   Chol: 114 (04/07/2009)   HDL: 39.70 (04/07/2009)   LDL: 38 (04/07/2009)   TG: 182.0 (04/07/2009)  Complete Medication List: 1)  Metoprolol Tartrate 50 Mg Tabs (Metoprolol tartrate) .... Take 1/2 by mouth two times a day 2)  Aspirin 81 Mg Tabs (Aspirin) .... Take one by mouth daily 3)  Lisinopril 40 Mg Tabs (Lisinopril) .... Take one by mouth daily 4)  Fish Oil 1000 Mg Caps (Omega-3 fatty acids)  .... Take one by mouth daily prn 5)  Hydrochlorothiazide 25 Mg Tabs (Hydrochlorothiazide) .... Take one by mouth daily 6)  Zetia 10 Mg Tabs (Ezetimibe) .... Take one by mouth daily 7)  Simvastatin 40 Mg Tabs (Simvastatin) .... Take one by mouth daily 8)  Viagra 100 Mg Tabs (Sildenafil citrate) .... One tab by mouth 1 hour prior to desired relatiuons. 9)  Alprazolam 0.25 Mg Tbdp (Alprazolam) .... One tab by mouth at night, max three times a day as needed anxiety 10)  Metformin Hcl 500 Mg Tabs (Metformin hcl) .... One tab by mouth at night  Patient Instructions: 1)  RTC beginning of June. Prescriptions: ALPRAZOLAM 0.25 MG TBDP (ALPRAZOLAM) one tab by mouth at night, max three times a day as needed anxiety  #50 x 0   Entered and Authorized by:   Shaune Leeks MD   Signed by:   Shaune Leeks MD on 05/25/2009   Method used:   Print then Give to Patient   RxID:   [id number]   Current Allergies (reviewed today): MORPHINE SULFATE (MORPHINE SULFATE) NITROGLYCERIN

## 2010-03-22 NOTE — Progress Notes (Signed)
Summary: wants referral to pulmonary  Phone Note Call from Patient Call back at Home Phone [phone] Call back at [phone]   Caller: Patient Summary of Call: Pt is asking for a referral to Dr. Sherene Sires.  He is having choking spells, coughing up slimey mucous, unable to breathe at night.  This is same problem that he went to Dr. Jenne Campus for.  He told pt that if he continued to have the problem he would need to see another specialist.   He wants to see someone as soon as possible- says that when he starts coughing it cuts off his wind. Initial call taken by: Lowella Petties CMA, AAMA,  January 01, 2010 9:36 AM  Follow-up for Phone Call        please refer to pulm.  please send request for ENT consult note. Follow-up by: Eustaquio Boyden  MD,  January 01, 2010 11:37 AM  Additional Follow-up for Phone Call Additional follow up Details #1::        [facility] ENT and requested their note. Appt made with Dr Sherene Sires on 01/09/2010 at 1:15am. Additional Follow-up by: Carlton Adam,  January 01, 2010 11:52 AM     Appended Document: wants referral to pulmonary ENT note obtained.  asked to scan.  thought to be laryngospasm, working on referral to [facility].  pt states valium three times a day working during day but not at night.  Went to ER for eval after seen by ENT.  Last night had severe episode of choking.  spoke with cards who suggested pulm referral.  antihistamine didn't help.  mucinex D not really helping.  will call Dr. Jenne Campus tomorrow to ask to set up with laryngologist.  Appended Document: wants referral to pulmonary spoke with Dr. Jenne Campus, able to schedule laryngologist appt within next few days.  Will call pt tomorrow or thursday to ensure appt scheduled, if case, may cancel pulm for now until seen by laryngologist.

## 2010-03-22 NOTE — Therapy (Signed)
Summary: Audioscope Screening Results  Audioscope Screening Results   Imported By: Beau Fanny 04/13/2009 10:01:33  _____________________________________________________________________  External Attachment:    Type:   Image     Comment:   External Document

## 2010-03-22 NOTE — Assessment & Plan Note (Signed)
Summary: cpx   Vital Signs:  Patient profile:   76 year old male Height:      67 inches Weight:      216 pounds Temp:     98.2 degrees F oral Pulse rate:   56 / minute Pulse rhythm:   regular BP sitting:   126 / 74  (left arm) Cuff size:   large  Vitals Entered By: Sydell Axon LPN (April 12, 2009 2:51 PM) CC: 30 Minute checkup, needs DOT paperwork completed, had a colonoscopy 2 years ago at [facility], Preventive Care  Vision Screening:Left eye with correction: 20 / 25 Right eye with correction: 20 / 25 Both eyes with correction: 20 / 25       25db HL: Left  500 hz: 25db 1000 hz: 25db 2000 hz: 25db 4000 hz: 25db Right  500 hz: 25db 1000 hz: 25db 2000 hz: 25db 4000 hz: 25db    History of Present Illness: Pt here for followup. 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 hehas no complaints today. he feels well.  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  (ICD-272.4) 17)  Obesity  (ICD-278.00) 18)  Hypertension  (ICD-401.9) 19)  Diverticulosis, Colon Via Colonoscopy   (ICD-562.10) 20)  Coronary Artery Disease  (ICD-414.00)  Medications Prior to Update: 1)  Metoprolol Tartrate 50 Mg Tabs (Metoprolol Tartrate) .... Take 1/2 By Mouth Bid 2)  Aspirin 81 Mg Tabs (Aspirin) .... Take One By Mouth Daily 3)  Lisinopril 40 Mg Tabs (Lisinopril) .... Take One By Mouth Daily 4)  Fish Oil 1000 Mg Caps (Omega-3 Fatty Acids) .... Take One By Mouth Daily Prn 5)  Hydrochlorothiazide 25 Mg Tabs (Hydrochlorothiazide) .... Take One By Mouth Daily 6)  Zetia 10 Mg Tabs (Ezetimibe) .... Take One By Mouth Daily 7)  Simvastatin 40 Mg Tabs (Simvastatin) .... Take One By Mouth Daily 8)  Zolpidem Tartrate 10 Mg Tabs (Zolpidem Tartrate) .... Take 1 By Mouth Q Hs Prn 9)  Viagra 100 Mg Tabs (Sildenafil Citrate) .... One Tab By Mouth 1 Hour Prior To Desired Relatiuons. 10)  Bentyl 10 Mg Caps (Dicyclomine Hcl) .... One Tab By Mouth Four Times A Day Only As Needed 11)  Zovirax 800 Mg  Tabs (Acyclovir) .... One Tab By Mouth Qid For 5days Only As Needed 12)  Detrol La 4 Mg Xr24h-Cap (Tolterodine Tartrate) .... One Tab By Mouth Qd 13)  Proscar 5 Mg Tabs (Finasteride) .... One Tab By Mouth Qd 14)  Calcitriol 1 Mcg/ml Soln (Calcitriol) .... One Tab By Mouth Once A Week 15)  Drisdol 28413  Unit Caps (Ergocalciferol) .... One Tab By Mouth Every Week 16)  Lovaza 1 Gm Caps (Omega-3-Acid Ethyl Esters) .... 2 Capsules Twice A Day By Mouth 17)  Flomax 0.4 Mg Xr24h-Cap (Tamsulosin Hcl) .... One Tab By Mouth Once Daily. 18)  Pyridium 200 Mg Tabs (Phenazopyridine Hcl) .... One Tab By Mouth Three Times A Day 19)  Alprazolam 0.25 Mg Tbdp (Alprazolam) .... One Tab By Mouth Three Times A Day As Needed Anxiety  Allergies: 1)  Morphine Sulfate (Morphine Sulfate) 2)  Nitroglycerin  Past History:  Past Surgical History: Last updated: 10/05/2008 Choleycystectomy 1985 CABG x 6  MI Attempted Angioplasty 05/1993 R Ing and Ventr Herniorrapphy 11/2000 L ankle Fx 03/2002 Cardiolite ETT, negative ischemia,   post. poss. MI EF 52% 07/03/04 Colonoscopy, divertics 04/02/05 CT Abd No renal calculi No acute abnormality 06/02/07 ETT Myoview  NML  (Dr Deborah Chalk) 12/2007 Cystoscopy Nml (Dr Lonna Cobb) 10/03/08  Family History: Last updated: 2009-05-05 Father: Died at age 44, during brain surgery  Mother: Died at age 70, renal failure, CAD/CHF  Brother A 70  HTN MI ETOH Brother A 65 HTN Sister A 40  HTN CV,  Self, brother MI, mother CABG x 7 HBP,  Brother and sister  Social History: Last updated: 07/01/2006 Marital Status: Married Children: 5 boys, out of home Occupation: Education officer, environmental  Risk Factors: Alcohol Use: 0 (2009-05-05) Caffeine Use: 1 (05/05/09) Exercise: no (2009/05/05)  Risk Factors: Smoking Status: never (05-05-2009) Passive Smoke Exposure: no (05/05/2009)  Family History: Father: Died at age 18, during brain surgery  Mother: Died at age 33, renal failure, CAD/CHF  Brother A 70  HTN MI ETOH Brother A 65 HTN Sister A 71  HTN CV,  Self, brother MI, mother CABG x 7 HBP,  Brother and sister  Review of Systems General:  Complains of weakness; denies chills, fatigue, fever, sweats, and weight loss; mild latley with head congestion.. Eyes:  Denies blurring, discharge, and eye pain. ENT:  Denies decreased hearing, ear discharge, and earache. CV:  Complains of fatigue; denies chest pain or discomfort, fainting, palpitations, shortness of breath with exertion, swelling of feet, and swelling of hands; mild acutely. Resp:  Denies cough, shortness of breath, and wheezing. GI:  Denies abdominal pain, bloody stools, change in bowel habits, constipation, dark tarry stools, diarrhea, indigestion, loss of appetite, nausea, vomiting, vomiting blood, and yellowish skin color. GU:  Denies discharge, dysuria, and nocturia. MS:  Complains of joint pain; denies joint redness, low back pain, muscle aches, cramps, and stiffness; knees bilat.. Derm:  Denies dryness, flushing, itching, and rash. Neuro:   Denies numbness, poor balance, tingling, and tremors.  Physical Exam  General:  Well-developed,well-nourished,in no acute distress; alert,appropriate and cooperative throughout examination, obese. Head:  Normocephalic and atraumatic without obvious abnormalities. No apparent alopecia or balding. Mild thinning of hair in male pattern. Sinuses NT. Eyes:  Conjunctiva clear bilaterally.  Ears:  External ear exam shows no significant lesions or deformities.  Otoscopic examination reveals clear canals, tympanic membranes are intact bilaterally without bulging, retraction, inflammation or discharge. Hearing is grossly normal bilaterally. Nose:  External nasal examination shows no deformity or inflammation. Nasal mucosa are pink and moist without lesions or exudates. Mouth:  Oral mucosa and oropharynx without lesions or exudates.  Teeth in good repair. Neck:  No deformities, masses, or tenderness noted. Chest Wall:  No deformities, masses, tenderness or gynecomastia noted. Breasts:  No masses or gynecomastia noted Lungs:  Normal respiratory effort, chest expands symmetrically. Lungs are clear to auscultation,  no crackles or wheezes. Heart:  Normal rate and regular rhythm. S1 and S2 normal without gallop, murmur, click, rub or other extra sounds. Abdomen:  Bowel sounds positive,abdomen soft and non-tender without masses, organomegaly or hernias noted. Protuberant. Rectal:  No external abnormalities noted. Normal sphincter tone. No rectal masses or tenderness. G neg. Genitalia:  Testes bilaterally descended without nodularity, tenderness or masses. No scrotal masses or lesions. No penis lesions or urethral discharge. Prostate:  Prostate gland firm and smooth, no enlargement, nodularity, tenderness, mass, asymmetry or induration. 10gms, nonboggy. Msk:  No deformity or scoliosis noted of thoracic or lumbar spine.   Pulses:  R and L carotid,radial,femoral,dorsalis pedis and posterior tibial pulses are full  and equal bilaterally Extremities:  No clubbing, cyanosis, edema, or deformity noted with normal full range of motion of all joints.   Neurologic:  No cranial nerve deficits noted. Station and gait are normal.Sensory, motor and coordinative functions appear intact. Skin:  Intact without suspicious lesions or rashes, multiple AKs and SKs. Cervical Nodes:  No lymphadenopathy noted Inguinal Nodes:  No significant adenopathy Psych:  Cognition and judgment appear intact. Alert and cooperative with normal attention span and concentration. No apparent delusions, illusions, hallucinations   Impression & Recommendations:  Problem # 1:  UNSPECIFIED VITAMIN D DEFICIENCY (ICD-268.9) Near therapeutic. Cont Vit D 1000Iu two times a day with occas three times a day.  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  The following medications were removed from the medication list:    Zolpidem Tartrate 10 Mg Tabs (Zolpidem tartrate) .Marland Kitchen... Take 1 by mouth at bedtime as needed  Discussed sleep hygiene.   Problem # 3:  HERPES SIMPLEX INFECTION, LABIALIS (ICD-054.9) Assessment: Unchanged Stable and better contolled lately.  Problem # 4:  BACK PAIN (ICD-724.5) Assessment: Unchanged Chronic but tolerated. His updated medication list for this problem includes:    Aspirin 81 Mg Tabs (Aspirin) .Marland Kitchen... Take one by mouth daily  Problem # 5:  BENIGN PROSTATIC HYPERTROPHY, HX OF (ICD-V13.8) Assessment: Unchanged Stable, has frequency but beareble.  Problem # 6:  GLUCOSE INTOLERANCE, HX OF (ICD-V12.2) Now accelerated to diabetes. Start Metformin 500 at night. Recheck.  Problem # 7:  HYPERLIPIDEMIA/ TRIG. (ICD-272.4) Assessment: Unchanged Good control, cont curr meds. The following medications were removed from the medication list:    Lovaza 1 Gm Caps (Omega-3-acid ethyl esters) .Marland Kitchen... 2 capsules twice a day by mouth His updated medication list  for this problem includes:    Zetia 10 Mg Tabs (Ezetimibe) .Marland Kitchen... Take one by mouth daily    Simvastatin 40 Mg Tabs (Simvastatin) .Marland Kitchen... Take one by mouth daily  Labs Reviewed: SGOT: 37 (04/07/2009)   SGPT: 54 (04/07/2009)   HDL:39.70 (04/07/2009), 40 (07/25/2008)  LDL:38 (04/07/2009), 111 (16/11/9602)  Chol:114 (04/07/2009), 187 (07/25/2008)  Trig:182.0 (04/07/2009), 180 (07/25/2008)  Problem # 8:  OBESITY (ICD-278.00) Assessment: Unchanged  Encouraged to start using the treadmill he is getting back from his daughter. Weight loss will helpmany things.  Ht: 67 (04/12/2009)   Wt: 216 (04/12/2009)   BMI: 33.03 (02/20/2009)  Problem # 9:  HYPERTENSION (ICD-401.9) Assessment: Unchanged  His updated medication list for this problem includes:    Metoprolol Tartrate 50 Mg Tabs (Metoprolol tartrate) .Marland Kitchen... Take 1/2 by mouth two times a day    Lisinopril 40 Mg Tabs (Lisinopril) .Marland Kitchen... Take one by mouth daily    Hydrochlorothiazide 25 Mg Tabs (Hydrochlorothiazide) .Marland Kitchen... Take one by mouth daily  BP  today: 126/74 Prior BP: 130/74 (02/20/2009)  Labs Reviewed: K+: 4.3 (04/07/2009) Creat: : 0.9 (04/07/2009)   Chol: 114 (04/07/2009)   HDL: 39.70 (04/07/2009)   LDL: 38 (04/07/2009)   TG: 182.0 (04/07/2009)  Problem # 10:  DIVERTICULOSIS, COLON VIA COLONOSCOPY (ICD-562.10) Assessment: Unchanged  Discussed being seen for pain more than 2-3 days.  Colonoscopy:  Labs Reviewed: Hgb: 15.8 (04/07/2009)   Hct: 46.4 (04/07/2009)   WBC: 6.7 (04/07/2009)  Complete Medication List: 1)  Metoprolol Tartrate 50 Mg Tabs (Metoprolol tartrate) .... Take 1/2 by mouth two times a day 2)  Aspirin 81 Mg Tabs (Aspirin) .... Take one by mouth daily 3)  Lisinopril 40 Mg Tabs (Lisinopril) .... Take one by mouth daily 4)  Fish Oil 1000 Mg Caps (Omega-3 fatty acids) .... Take one by mouth daily prn 5)  Hydrochlorothiazide 25 Mg Tabs (Hydrochlorothiazide) .... Take one by mouth daily 6)  Zetia 10 Mg Tabs (Ezetimibe)  .... Take one by mouth daily 7)  Simvastatin 40 Mg Tabs (Simvastatin) .... Take one by mouth daily 8)  Viagra 100 Mg Tabs (Sildenafil citrate) .... One tab by mouth 1 hour prior to desired relatiuons. 9)  Detrol La 4 Mg Xr24h-cap (Tolterodine tartrate) .... One tab by mouth daily 10)  Pyridium 200 Mg Tabs (Phenazopyridine hcl) .... One tab by mouth three times a day 11)  Alprazolam 0.25 Mg Tbdp (Alprazolam) .... One tab by mouth three times a day as needed anxiety 12)  Metformin Hcl 500 Mg Tabs (Metformin hcl) .... One tab by mouth at night  Other Orders: TD Toxoids IM 7 YR + [phone]) Admin 1st Vaccine (60454) Admin 1st Vaccine Horticulturist, commercial) [phone]) Prescription Created Electronically [id number])  Colorectal Screening:  Colonoscopy Results:    Date of Exam: 04/02/2005    Results: Diverticulosis  PSA Screening:    PSA: 0.41  (04/07/2009)  Immunization & Chemoprophylaxis:    Tetanus vaccine: Td  (04/12/2009)    Influenza vaccine: Fluvax 3+  (03/01/2009)    Pneumovax: Pneumovax  (05/10/2004)  Patient Instructions: 1)  RTC 6 weeks. A1C, Fasting BMET prior  2)  Take Guaifenesin by going to [facility] or [facility] and getting MUCOUS RELIEF EXPECTORANT (400mg ), take 11/2 tabs by mouth AM and NOON. 3)  Drink lots of fluids anytime taking Guaifenesin.  4)  Tyl regularly. Prescriptions: METFORMIN HCL 500 MG TABS (METFORMIN HCL) one tab by mouth at night  #30 x 12   Entered and Authorized by:   Shaune Leeks MD   Signed by:   Shaune Leeks MD on 04/12/2009   Method used:   Electronically to        [facility]* (retail)       [address] [place], [place]  [address]       Ph: [phone]       Fax: [phone]   RxID:   [id number]   Current Allergies (reviewed today): MORPHINE SULFATE (MORPHINE SULFATE) NITROGLYCERIN   Tetanus/Td Vaccine    Vaccine Type: Td    Site: left deltoid    Mfr: Sanofi Pasteur    Dose: 0.5 ml    Route: IM    Given by:  Sydell Axon LPN    Exp. Date: 01/03/2011    Lot #: [id number]

## 2010-03-22 NOTE — Assessment & Plan Note (Signed)
Summary: COUGH/CLE   Vital Signs:  Patient profile:   76 year old male Height:      67 inches Weight:      203 pounds BMI:     31.91 O2 Sat:      96 % on Room air Temp:     98.3 degrees F oral Pulse rate:   54 / minute Pulse rhythm:   regular BP sitting:   142 / 70  (left arm) Cuff size:   large  Vitals Entered By: Selena Batten Dance CMA (AAMA) (December 20, 2009 11:09 AM)  O2 Flow:  Room air CC: Cough x1 week   History of Present Illness: CC: cough  1 wk h/o cough.  Cough severe, has trouble catching breath.  Coughing several times during one exhalation.  Feels achey in mouth and dust in throat.  Started as cough.  + HA off and on.  worse at night.  Never fevers/chills, ST, abd pain/n/v, muscle pains or joint pains, rashes.  No RN, sneezing.  h/o CAD, recent stent 3 mo ago.  Not around children.  No smokers at home.  Wife with similar cough, has been going on for 5 wks.  Current Medications (verified): 1)  Metoprolol Tartrate 50 Mg Tabs (Metoprolol Tartrate) .... Take 1/2 By Mouth Two Times A Day 2)  Aspirin 325 Mg Tabs (Aspirin) .... One Daily 3)  Lisinopril 40 Mg Tabs (Lisinopril) .... Take One By Mouth Daily 4)  Hydrochlorothiazide 25 Mg Tabs (Hydrochlorothiazide) .... Take One By Mouth Daily 5)  Zetia 10 Mg Tabs (Ezetimibe) .... Take One By Mouth Daily 6)  Simvastatin 40 Mg Tabs (Simvastatin) .... Take One By Mouth Daily 7)  Viagra 100 Mg Tabs (Sildenafil Citrate) .... One Tab By Mouth 1 Hour Prior To Desired Relatiuons. 8)  Alprazolam 0.25 Mg Tbdp (Alprazolam) .... One Tab By Mouth At Night, Max Three Times A Day As Needed Anxiety 9)  Metformin Hcl 500 Mg Tabs (Metformin Hcl) .... One Tab By Mouth At Night 10)  Ambien 10 Mg Tabs (Zolpidem Tartrate) .... Take 1/2-1 Tab  By Mouth At Bedtime As Needed 11)  Finasteride 5 Mg Tabs (Finasteride) .... Take One By Mouth Daily 12)  Mucinex Dm 30-600 Mg Xr12h-Tab (Dextromethorphan-Guaifenesin) .Marland Kitchen.. 1 By Mouth Two Times A  Day  Allergies: 1)  Morphine Sulfate (Morphine Sulfate) 2)  Nitroglycerin  Past History:  Past Medical History: CAD/MI with stents HTN HLD  Review of Systems       per HPI  Physical Exam  General:  Well-developed,well-nourished,in no acute distress; alert,appropriate and cooperative throughout examination Head:  Normocephalic and atraumatic without obvious abnormalities. No apparent alopecia or balding. Mild thinning of hair in male pattern. Sinuses NT. Eyes:  Conjunctiva clear bilaterally.  Ears:  External ear exam shows no significant lesions or deformities.  Otoscopic examination reveals clear canals, tympanic membranes are intact bilaterally without bulging, retraction, inflammation or discharge. Hearing is grossly normal bilaterally. Nose:  External nasal examination shows no deformity or inflammation. Nasal mucosa are pink and moist without lesions or exudates. Mouth:  Oral mucosa and oropharynx without lesions or exudates.  Teeth in good repair. Neck:  No deformities, masses, or tenderness noted. Lungs:  Normal respiratory effort, chest expands symmetrically. Lungs are clear to auscultation, no crackles or wheezes. Heart:  Normal rate and regular rhythm. S1 and S2 normal without gallop, murmur, click, rub or other extra sounds.   Impression & Recommendations:  Problem # 1:  ACUTE BRONCHITIS (ICD-466.0) Take antibiotics  and other medications as directed. Encouraged to push clear liquids, get enough rest, and take acetaminophen as needed. To be seen in 5-7 days if no improvement, sooner if worse.  h/o morphine allergy.  h/o intolerance to tylenol PM (makes him restless).  will try cheratussin and update [place] on how he does.  His updated medication list for this problem includes:    Tessalon Perles 100 Mg Caps (Benzonatate) ..... One by mouth three times a day as needed cough    Zithromax Z-pak 250 Mg Tabs (Azithromycin) ..... Use as directed    Cheratussin Ac 100-10 Mg/30ml  Syrp (Guaifenesin-codeine) ..... One teaspoon every 6hours as needed coughing  Orders: Prescription Created Electronically [phone])  Complete Medication List: 1)  Metoprolol Tartrate 50 Mg Tabs (Metoprolol tartrate) .... Take 1/2 by mouth two times a day 2)  Aspirin 325 Mg Tabs (Aspirin) .... One daily 3)  Lisinopril 40 Mg Tabs (Lisinopril) .... Take one by mouth daily 4)  Hydrochlorothiazide 25 Mg Tabs (Hydrochlorothiazide) .... Take one by mouth daily 5)  Zetia 10 Mg Tabs (Ezetimibe) .... Take one by mouth daily 6)  Simvastatin 40 Mg Tabs (Simvastatin) .... Take one by mouth daily 7)  Viagra 100 Mg Tabs (Sildenafil citrate) .... One tab by mouth 1 hour prior to desired relatiuons. 8)  Alprazolam 0.25 Mg Tbdp (Alprazolam) .... One tab by mouth at night, max three times a day as needed anxiety 9)  Metformin Hcl 500 Mg Tabs (Metformin hcl) .... One tab by mouth at night 10)  Ambien 10 Mg Tabs (Zolpidem tartrate) .... Take 1/2-1 tab  by mouth at bedtime as needed 11)  Finasteride 5 Mg Tabs (Finasteride) .... Take one by mouth daily 12)  Tessalon Perles 100 Mg Caps (Benzonatate) .... One by mouth three times a day as needed cough 13)  Zithromax Z-pak 250 Mg Tabs (Azithromycin) .... Use as directed 14)  Cheratussin Ac 100-10 Mg/80ml Syrp (Guaifenesin-codeine) .... One teaspoon every 6hours as needed coughing  Patient Instructions: 1)  You have bad bronchitis. 2)  take zpack as directed.   3)  Start with tessalon perls.  If doesn't help, use cough medicine with codeine (cheratussin). 4)  Push fluids and get plenty of rest. 5)  Please return if you are not improving as expected, or if you have high fevers (>101.5) or difficulty breathing, wheezing, swallowing. 6)  Call clinic with questions.  Pleasure to see you today!  Prescriptions: CHERATUSSIN AC 100-10 MG/5ML SYRP (GUAIFENESIN-CODEINE) one teaspoon every 6hours as needed coughing  #100cc x 0   Entered and Authorized by:   Eustaquio Boyden   MD   Signed by:   Eustaquio Boyden  MD on 12/20/2009   Method used:   Print then Give to Patient   RxID:   [id number] Z-PAK 250 MG TABS (AZITHROMYCIN) use as directed  #1 x 0   Entered and Authorized by:   Eustaquio Boyden  MD   Signed by:   Eustaquio Boyden  MD on 12/20/2009   Method used:   Electronically to        [facility]* (retail)       [address] [place], [place]  [address]       Ph: [phone]       Fax: [phone]   RxID:   [id number] TESSALON PERLES 100 MG CAPS (BENZONATATE) one by mouth three times a day as needed cough  #30 x 0   Entered  and Authorized by:   Eustaquio Boyden  MD   Signed by:   Eustaquio Boyden  MD on 12/20/2009   Method used:   Electronically to        [facility]* (retail)       [address] [place], [place]  [address]       Ph: [phone]       Fax: [phone]   RxID:   [id number]    Orders Added: 1)  Prescription Created Electronically [G8553] 2)  Est. Patient Level III [84132]    Current Allergies (reviewed today): MORPHINE SULFATE (MORPHINE SULFATE) NITROGLYCERIN

## 2010-03-23 ENCOUNTER — Ambulatory Visit (HOSPITAL_COMMUNITY): Payer: Medicare Other | Attending: Cardiology

## 2010-03-23 DIAGNOSIS — I379 Nonrheumatic pulmonary valve disorder, unspecified: Secondary | ICD-10-CM | POA: Insufficient documentation

## 2010-03-23 DIAGNOSIS — M7989 Other specified soft tissue disorders: Secondary | ICD-10-CM | POA: Insufficient documentation

## 2010-03-23 DIAGNOSIS — E119 Type 2 diabetes mellitus without complications: Secondary | ICD-10-CM | POA: Insufficient documentation

## 2010-03-23 DIAGNOSIS — I10 Essential (primary) hypertension: Secondary | ICD-10-CM | POA: Insufficient documentation

## 2010-03-23 DIAGNOSIS — E785 Hyperlipidemia, unspecified: Secondary | ICD-10-CM | POA: Insufficient documentation

## 2010-03-23 DIAGNOSIS — E669 Obesity, unspecified: Secondary | ICD-10-CM | POA: Insufficient documentation

## 2010-03-23 DIAGNOSIS — I059 Rheumatic mitral valve disease, unspecified: Secondary | ICD-10-CM | POA: Insufficient documentation

## 2010-03-23 DIAGNOSIS — I079 Rheumatic tricuspid valve disease, unspecified: Secondary | ICD-10-CM | POA: Insufficient documentation

## 2010-03-23 DIAGNOSIS — R609 Edema, unspecified: Secondary | ICD-10-CM

## 2010-03-28 NOTE — Progress Notes (Signed)
Summary: Echo appt  Phone Note Outgoing Call Call back at [facility] Phone [phone]   Call placed by: Stanton Kidney, EMT-P,  March 22, 2010 2:49 PM Action Taken: Phone Call Completed Summary of Call: Left message ref: echo appt, to arrive 30 min prior to appt. Stanton Kidney, EMT-P  March 22, 2010 2:49 PM

## 2010-04-06 ENCOUNTER — Encounter: Payer: Self-pay | Admitting: Family Medicine

## 2010-04-06 ENCOUNTER — Other Ambulatory Visit (INDEPENDENT_AMBULATORY_CARE_PROVIDER_SITE_OTHER): Payer: Medicare Other

## 2010-04-06 ENCOUNTER — Encounter (INDEPENDENT_AMBULATORY_CARE_PROVIDER_SITE_OTHER): Payer: Self-pay | Admitting: *Deleted

## 2010-04-06 ENCOUNTER — Telehealth (INDEPENDENT_AMBULATORY_CARE_PROVIDER_SITE_OTHER): Payer: Self-pay | Admitting: *Deleted

## 2010-04-06 DIAGNOSIS — I2510 Atherosclerotic heart disease of native coronary artery without angina pectoris: Secondary | ICD-10-CM

## 2010-04-06 DIAGNOSIS — I10 Essential (primary) hypertension: Secondary | ICD-10-CM

## 2010-04-06 DIAGNOSIS — E785 Hyperlipidemia, unspecified: Secondary | ICD-10-CM

## 2010-04-06 DIAGNOSIS — Z125 Encounter for screening for malignant neoplasm of prostate: Secondary | ICD-10-CM

## 2010-04-06 DIAGNOSIS — K5730 Diverticulosis of large intestine without perforation or abscess without bleeding: Secondary | ICD-10-CM

## 2010-04-06 DIAGNOSIS — E119 Type 2 diabetes mellitus without complications: Secondary | ICD-10-CM

## 2010-04-07 LAB — CONVERTED CEMR LAB
AST: 27 units/L (ref 0–37)
Alkaline Phosphatase: 57 units/L (ref 39–117)
BUN: 19 mg/dL (ref 6–23)
Basophils Relative: 1 % (ref 0–1)
Creatinine, Ser: 0.92 mg/dL (ref 0.40–1.50)
Eosinophils Absolute: 0.3 10*3/uL (ref 0.0–0.7)
HDL: 34 mg/dL — ABNORMAL LOW (ref >39)
Hemoglobin: 15.2 g/dL (ref 13.0–17.0)
LDL Cholesterol: 29 mg/dL (ref 0–99)
MCHC: 34.5 g/dL (ref 30.0–36.0)
MCV: 91.7 fL (ref 78.0–100.0)
Microalb, Ur: 0.97 mg/dL (ref 0.00–1.89)
Monocytes Absolute: 0.7 10*3/uL (ref 0.1–1.0)
Monocytes Relative: 11 % (ref 3–12)
RBC: 4.8 M/uL (ref 4.22–5.81)
TSH: 2.143 microintl units/mL (ref 0.350–4.500)
Total CHOL/HDL Ratio: 3.2

## 2010-04-09 ENCOUNTER — Other Ambulatory Visit: Payer: Medicare Other

## 2010-04-11 NOTE — Progress Notes (Signed)
----   Converted from flag ---- ---- 04/06/2010 2:08 PM, Liane Comber CMA (AAMA) wrote:   ---- 04/06/2010 2:05 PM, Eustaquio Boyden  MD wrote: FLP, CMP, PSA, A1c, microalb, CBC 272.4, 401.9, 250.0, V76.44  ---- 04/06/2010 8:18 AM, Liane Comber CMA (AAMA) wrote: Lab orders please!  Good Morning! This pt had labs today fpr upcomming cpx with you. We drew his blood and got a urine. Which test would you like ordered?  Thanks Tasha ------------------------------

## 2010-04-13 ENCOUNTER — Encounter (INDEPENDENT_AMBULATORY_CARE_PROVIDER_SITE_OTHER): Payer: Medicare Other | Admitting: Family Medicine

## 2010-04-13 ENCOUNTER — Encounter: Payer: Self-pay | Admitting: Family Medicine

## 2010-04-13 ENCOUNTER — Other Ambulatory Visit: Payer: Medicare Other

## 2010-04-13 DIAGNOSIS — Z0000 Encounter for general adult medical examination without abnormal findings: Secondary | ICD-10-CM

## 2010-04-13 DIAGNOSIS — E119 Type 2 diabetes mellitus without complications: Secondary | ICD-10-CM

## 2010-04-13 DIAGNOSIS — K219 Gastro-esophageal reflux disease without esophagitis: Secondary | ICD-10-CM

## 2010-04-13 DIAGNOSIS — E785 Hyperlipidemia, unspecified: Secondary | ICD-10-CM

## 2010-04-13 DIAGNOSIS — Z125 Encounter for screening for malignant neoplasm of prostate: Secondary | ICD-10-CM

## 2010-04-13 DIAGNOSIS — I2510 Atherosclerotic heart disease of native coronary artery without angina pectoris: Secondary | ICD-10-CM

## 2010-04-13 DIAGNOSIS — I10 Essential (primary) hypertension: Secondary | ICD-10-CM

## 2010-04-13 DIAGNOSIS — R319 Hematuria, unspecified: Secondary | ICD-10-CM

## 2010-04-13 LAB — CONVERTED CEMR LAB
Bacteria, UA: 0
Bilirubin Urine: NEGATIVE
Glucose, Urine, Semiquant: NEGATIVE
Nitrite: NEGATIVE
Specific Gravity, Urine: 1.02

## 2010-04-16 ENCOUNTER — Encounter: Payer: Medicare Other | Admitting: Family Medicine

## 2010-04-17 NOTE — Letter (Signed)
Summary: Nature conservation officer [employer] Wellness Visit Questionnaire   [employer] Annual Wellness Visit Questionnaire   Imported By: Beau Fanny 04/13/2010 15:32:24  _____________________________________________________________________  External Attachment:    Type:   Image     Comment:   External Document

## 2010-04-17 NOTE — Assessment & Plan Note (Signed)
Summary: cpx dot phy/rbh medicare wellness gave pt new medicare questi...   Vital Signs:  Patient profile:   76 year old male Height:      65.5 inches Weight:      206 pounds Temp:     97.8 degrees F Pulse rate:   64 / minute Pulse rhythm:   regular BP sitting:   140 / 72  (left arm) Cuff size:   regular  Vitals Entered By: Lowella Petties CMA, AAMA (April 13, 2010 8:33 AM) CC: Check up with form.  Vision Screening:Left eye with correction: 20 / 25-2 Right eye with correction: 20 / 20 Both eyes with correction: 20 / 20-1        20db HL: Left  500 hz: 25db 1000 hz: No Response 2000 hz: 25db 4000 hz: 25db Right  500 hz: 25db 1000 hz: No Response 2000 hz: 25db 4000 hz: 25db    History of Present Illness: CC: CPE, f/u  presents for f/u issues as well as DOT trasportation physical (sometimes drives motorcade at church, is a Education officer, environmental).  1. DM - tolerating metformin 500mg  nightly.  last eye exam was 06/2009 looking fine, checks sugars nonfasting, highest 150s.  A1c 5.6%.  2. CAD - very stable, hasn't needed nitro in 17 years since 1st MI and CABG.  no CP/SOB.  sees Deborah Chalk, he will retire soon.  3. HTN - no HA, vision changes, chest pain, tightness, urinary changes.  tolerating meds well.  mild leg swelling  4. HLD - cards changed simvastatin to pravastatin.  also on zetia.  tolerating well, no muscle aches.  5. choking episodes - dx with gerd and started with prilosec, speech therapy found GERD as well.  Trying to relax helps.  had one episode where passed out and another where went to ER last month but overall doing better on prilosec/pepcid.  blood work was fasting - reviewed with patient.  discussed advanced directives.  wife/son are pt's HCPOA, advanced directive is at home.  preventative: last tetanus unsure. prostate - PSA reassuring, due for DRE. colonoscopy 2007 UTD  Current Medications (verified): 1)  Metoprolol Tartrate 50 Mg Tabs (Metoprolol Tartrate)  .... Take 1/2 By Mouth Two Times A Day 2)  Aspirin 325 Mg Tabs (Aspirin) .... One Daily 3)  Zetia 10 Mg Tabs (Ezetimibe) .... Take One By Mouth Daily 4)  Pravastatin Sodium 40 Mg Tabs (Pravastatin Sodium) .... Take One Nightly 5)  Viagra 100 Mg Tabs (Sildenafil Citrate) .... One Tab By Mouth 1 Hour Prior To Desired Relatiuons. 6)  Metformin Hcl 500 Mg Tabs (Metformin Hcl) .... One Tab By Mouth At Night 7)  Ambien 10 Mg Tabs (Zolpidem Tartrate) .... Take 1/2-1 Tab  By Mouth At Bedtime As Needed 8)  Finasteride 5 Mg Tabs (Finasteride) .... Take One By Mouth Daily 9)  Diazepam 2 Mg Tabs (Diazepam) .Marland Kitchen.. 1 Up To Three Times A Day As Needed 10)  Amlodipine Besylate 5 Mg Tabs (Amlodipine Besylate) .Marland Kitchen.. 1 Once Daily 11)  Prilosec Otc 20 Mg Tbec (Omeprazole Magnesium) .... Take One 30-60 Min Before First and Last Meals of The Day 12)  Pepcid 20 Mg Tabs (Famotidine) .... Take One By Mouth At Bedtime  Allergies (verified): 1)  Morphine Sulfate (Morphine Sulfate) 2)  Nitroglycerin  Past History:  Past Surgical History: Last updated: 10/05/2008 Choleycystectomy 1985 CABG x 6  MI Attempted Angioplasty 05/1993 R Ing and Ventr Herniorrapphy 11/2000 L ankle Fx 03/2002 Cardiolite ETT, negative ischemia,  post. poss. MI EF  52% 07/03/04 Colonoscopy, divertics 04/02/05 CT Abd No renal calculi No acute abnormality 06/02/07 ETT Myoview  NML  (Dr Deborah Chalk) 12/2007 Cystoscopy Nml (Dr Lonna Cobb) 10/03/08  Family History: Last updated: 04-19-09 Father: Died at age 43, during brain surgery  Mother: Died at age 13, renal failure, CAD/CHF  Brother A 70  HTN MI ETOH Brother A 65 HTN Sister A 34  HTN CV,  Self, brother MI, mother CABG x 7 HBP,  Brother and sister  Social History: Last updated: 01/08/2010 Marital Status: Married Children: 5 boys, out of home Occupation: Education officer, environmental Never smoker No ETOH  Past Medical History: CAD/MI with stents HTN HLD T2DM GERD Chronic  rhinitis..............................................ENT [place]       Review of Systems  The patient denies anorexia, fever, weight loss, weight gain, vision loss, decreased hearing, hoarseness, chest pain, syncope, dyspnea on exertion, peripheral edema, prolonged cough, headaches, hemoptysis, abdominal pain, melena, hematochezia, severe indigestion/heartburn, hematuria, and depression.         some hoarseness. per HPI.  Physical Exam  General:  Well-developed,well-nourished,in no acute distress; alert,appropriate and cooperative throughout examination Head:  Normocephalic and atraumatic without obvious abnormalities. No apparent alopecia or balding. Mild thinning of hair in male pattern. Sinuses NT. Eyes:  Conjunctiva clear bilaterally.  PERRLA, EOMI Ears:  TMs clear bilaterally Nose:  nares clear bilaterally Mouth:  MMM, no pharyngeal erythema Neck:  No deformities, masses, or tenderness noted.  no LAD, no carotid bruits Lungs:  Normal respiratory effort, chest expands symmetrically. Lungs are clear to auscultation, no crackles or wheezes. Heart:  Normal rate and regular rhythm. S1 and S2 normal without gallop, murmur, click, rub or other extra sounds. Abdomen:  Bowel sounds positive,abdomen soft and non-tender without masses, organomegaly or hernias noted. Rectal:  No external abnormalities noted. Normal sphincter tone. No rectal masses or tenderness. hemoccult negative Prostate:  Prostate gland firm and smooth, no enlargement, nodularity, tenderness, mass, asymmetry or induration. Msk:  No deformity or scoliosis noted of thoracic or lumbar spine.   Pulses:  2+ rad pulses bilaterally Extremities:  no pedal edema bilaterally  Diabetes Management Exam:    Foot Exam (with socks and/or shoes not present):       Sensory-Pinprick/Light touch:          Left medial foot (L-4): normal          Left dorsal foot (L-5): normal          Left lateral foot (S-1): normal          Right  medial foot (L-4): normal          Right dorsal foot (L-5): normal          Right lateral foot (S-1): normal       Sensory-Monofilament:          Left foot: normal          Right foot: normal       Inspection:          Left foot: normal          Right foot: normal       Nails:          Left foot: thickened          Right foot: thickened    Eye Exam:       Eye Exam done elsewhere          Date: 06/18/2009          Results: normal  Done by: [facility]   Impression & Recommendations:  Problem # 1:  HEALTH MAINTENANCE EXAM (ICD-V70.0) Reviewed preventive care protocols, scheduled due services, and updated immunizations.  shingles shot provided today to get at pharmacy, advised to check with medicare (has part D) and call us if administered.  also brings in DOT physical forms, filled out but given CAD/MI and CABG hx (remote), recommended have cards approve as well.  anticipate no problem in approval for 1 year.  I have personally reviewed the Medicare Annual Wellness questionnaire and have noted 1.   The patient's medical and social history 2.   Their use of alcohol, tobacco or illicit drugs 3.   Their current medications and supplements 4.   The patient's functional ability including ADL's, fall risks, home safety risks and hearing or visual impairment. 5.   Diet and physical activities 6.   Evidence for depression or mood disorders  The patients weight, height, BMI and visual acuity have been recorded in the chart I have made referrals, counseling and provided education to the patient based on review of the above and I have provided the pt with a written personalized care plan for preventive services.  Orders: [facility] -Subsequent Annual Wellness Visit [phone])  Problem # 2:  SPECIAL SCREENING MALIGNANT NEOPLASM OF PROSTATE (ICD-V76.44)  DRE/PSA reassuring.  could stop next year.  Orders: [facility] -Subsequent Annual Wellness Visit [phone]) DRE (U9811) Prostate / PSA  (Medicare) (G0103)  Problem # 3:  HYPERTENSION (ICD-401.9) not optimal control, off ACEI 2/2 concern with previous cough.  may consider ARB in future.  His updated medication list for this problem includes:    Metoprolol Tartrate 50 Mg Tabs (Metoprolol tartrate) .Marland Kitchen... Take 1/2 by mouth two times a day    Amlodipine Besylate 5 Mg Tabs (Amlodipine besylate) .Marland Kitchen... 1 once daily  BP today: 140/72 Prior BP: 140/80 (01/08/2010)  Labs Reviewed: K+: 4.7 (04/06/2010) Creat: : 0.92 (04/06/2010)   Chol: 108 (04/06/2010)   HDL: 34 (04/06/2010)   LDL: 29 (04/06/2010)   TG: 226 (04/06/2010)  Problem # 4:  DIABETES MELLITUS, TYPE II (ICD-250.00) good control as evidenced by latest A1c.  continue metformin.  discussed heatlhy diet, exercise changes.  utd foot exam, vision screen, next due 06/2010. His updated medication list for this problem includes:    Aspirin 325 Mg Tabs (Aspirin) ..... One daily    Metformin Hcl 500 Mg Tabs (Metformin hcl) ..... One tab by mouth at night  Labs Reviewed: Creat: 0.92 (04/06/2010)     Last Eye Exam: normal (06/18/2009) Reviewed HgBA1c results: 5.6 (04/06/2010)  6.2 (05/23/2009)  Problem # 5:  HYPERLIPIDEMIA/ TRIG. (ICD-272.4) great control of LDL on current regimen, HDL a bit low and trig still high.  followed by Deborah Chalk.  His updated medication list for this problem includes:    Zetia 10 Mg Tabs (Ezetimibe) .Marland Kitchen... Take one by mouth daily    Pravastatin Sodium 40 Mg Tabs (Pravastatin sodium) .Marland Kitchen... Take one nightly  Labs Reviewed: SGOT: 27 (04/06/2010)   SGPT: 32 (04/06/2010)   HDL:34 (04/06/2010), 39.70 (04/07/2009)  LDL:29 (04/06/2010), 38 (04/07/2009)  Chol:108 (04/06/2010), 114 (04/07/2009)  Trig:226 (04/06/2010), 182.0 (04/07/2009)  Problem # 6:  GERD (ICD-530.81) recently diagnosed with GERD by pulm, started ppi and H2 blocker, sxs some better but not fully.  feels relaxation helps and speech therapy helping some.  continue to monitor.Marland Kitchen His updated  medication list for this problem includes:    Prilosec Otc 20 Mg Tbec (Omeprazole magnesium) .Marland Kitchen... Take  one 30-60 min before first and last meals of the day    Pepcid 20 Mg Tabs (Famotidine) .Marland Kitchen... Take one by mouth at bedtime  Problem # 7:  CORONARY ARTERY DISEASE (ICD-414.00) Assessment: Improved very stable. His updated medication list for this problem includes:    Metoprolol Tartrate 50 Mg Tabs (Metoprolol tartrate) .Marland Kitchen... Take 1/2 by mouth two times a day    Aspirin 325 Mg Tabs (Aspirin) ..... One daily    Amlodipine Besylate 5 Mg Tabs (Amlodipine besylate) .Marland Kitchen... 1 once daily  Labs Reviewed: Chol: 108 (04/06/2010)   HDL: 34 (04/06/2010)   LDL: 29 (04/06/2010)   TG: 226 (04/06/2010)  Problem # 8:  ERECTILE DYSFUNCTION (ICD-302.72) Assessment: Unchanged  requests refill.  not on a blocker or nitrate. His updated medication list for this problem includes:    Viagra 100 Mg Tabs (Sildenafil citrate) ..... One tab by mouth 1 hour prior to desired relatiuons.  Orders: Prescription Created Electronically [id number])  Problem # 9:  HEMATURIA UNSPECIFIED, PAINFUL (ICD-599.70) Assessment: Comment Only presumed previously to UTI.  today with tr intact blood UA but micro without any red cells.  continue to monitor for now.   Orders: UA Dipstick W/ Micro (manual) ([id number])  Problem # 10:  OBESITY (ICD-278.00) discussed healthy eating, increased exercise. Ht: 65.5 (04/13/2010)   Wt: 206 (04/13/2010)   BMI: 31.91 (12/20/2009)  Complete Medication List: 1)  Metoprolol Tartrate 50 Mg Tabs (Metoprolol tartrate) .... Take 1/2 by mouth two times a day 2)  Aspirin 325 Mg Tabs (Aspirin) .... One daily 3)  Zetia 10 Mg Tabs (Ezetimibe) .... Take one by mouth daily 4)  Pravastatin Sodium 40 Mg Tabs (Pravastatin sodium) .... Take one nightly 5)  Viagra 100 Mg Tabs (Sildenafil citrate) .... One tab by mouth 1 hour prior to desired relatiuons. 6)  Metformin Hcl 500 Mg Tabs (Metformin hcl) .... One tab by  mouth at night 7)  Ambien 10 Mg Tabs (Zolpidem tartrate) .... Take 1/2-1 tab  by mouth at bedtime as needed 8)  Finasteride 5 Mg Tabs (Finasteride) .... Take one by mouth daily 9)  Diazepam 2 Mg Tabs (Diazepam) .Marland Kitchen.. 1 up to three times a day as needed 10)  Amlodipine Besylate 5 Mg Tabs (Amlodipine besylate) .Marland Kitchen.. 1 once daily 11)  Prilosec Otc 20 Mg Tbec (Omeprazole magnesium) .... Take one 30-60 min before first and last meals of the day 12)  Pepcid 20 Mg Tabs (Famotidine) .... Take one by mouth at bedtime 13)  Zostavax 09811 Unt/0.86ml Solr (Zoster vaccine live) .Marland Kitchen.. 1 use as directed  Patient Instructions: 1)  Call Dr. Deborah Chalk to approve DOT physical. 2)  Shingles shot Rx given today. 3)  Blood work copy provided today. 4)  Continue medicines as up to now. 5)  Come back in 6 months for follow up. 6)  Good to see you today, call clinic with quesitons. 7)  I have provided you with a copy of your personalized plan for preventive services. Please take the time to review along with your updated medication list.   Prescriptions: VIAGRA 100 MG TABS (SILDENAFIL CITRATE) one tab by mouth 1 hour prior to desired relatiuons.  #10 x 12   Entered and Authorized by:   Eustaquio Boyden  MD   Signed by:   Eustaquio Boyden  MD on 04/13/2010   Method used:   Electronically to        [facility]* (retail)       [address]  [place], [place]  [address]       Ph: [phone]       Fax: [phone]   RxID:   [id number] 19400 UNT/0.65ML SOLR (ZOSTER VACCINE LIVE) 1 use as directed  #1 x 0   Entered and Authorized by:   Eustaquio Boyden  MD   Signed by:   Eustaquio Boyden  MD on 04/13/2010   Method used:   Print then Give to Patient   RxID:   [id number]    Orders Added: 1)  Est. Patient Level IV [87564] 2)  Prescription Created Electronically [G8553] 3)  [facility] -Subsequent Annual Wellness Visit [G0439] 4)  DRE [G0102] 5)  Prostate / PSA (Medicare) [G0103] 6)  UA Dipstick W/  Micro (manual) [81000]    Laboratory Results   Urine Tests  Date/Time Received: April 13, 2010 8:34 AM   Routine Urinalysis   Color: yellow Appearance: Clear Glucose: negative   (Normal Range: Negative) Bilirubin: negative   (Normal Range: Negative) Ketone: negative   (Normal Range: Negative) Spec. Gravity: 1.020   (Normal Range: 1.003-1.035) Blood: trace-intact   (Normal Range: Negative) pH: 6.0   (Normal Range: 5.0-8.0) Protein: trace   (Normal Range: Negative) Urobilinogen: 0.2   (Normal Range: 0-1) Nitrite: negative   (Normal Range: Negative) Leukocyte Esterace: negative   (Normal Range: Negative)  Urine Microscopic WBC/HPF: rare RBC/HPF: 0 Bacteria/HPF: 0 Mucous/HPF: some Epithelial/HPF: rare Crystals/HPF: no Casts/LPF: no Yeast/HPF: no    Comments: read by .............Eustaquio Boyden  MD  April 13, 2010 9:20 AM        Prevention & Chronic Care Immunizations   Influenza vaccine: Fluvax 3+  (03/01/2009)   Influenza vaccine due: 10/20/2010    Tetanus booster: 04/12/2009: Td   Tetanus booster due: 04/13/2019    Pneumococcal vaccine: Pneumovax  (05/10/2004)   Pneumococcal vaccine deferral: Not indicated  (04/13/2010)    H. zoster vaccine: Not documented  Colorectal Screening   Hemoccult: negative  (10/15/2007)    Colonoscopy: Diverticulosis  (04/02/2005)  Other Screening   PSA: 0.09  (04/06/2010)   Smoking status: never  (05/25/2009)  Diabetes Mellitus   HgbA1C: 5.6  (04/06/2010)    Eye exam: normal  (06/18/2009)   Eye exam due: 06/2010    Foot exam: yes  (04/13/2010)   High risk foot: Not documented   Foot care education: Not documented    Urine microalbumin/creatinine ratio: 8.1  (04/06/2010)  Lipids   Total Cholesterol: 108  (04/06/2010)   LDL: 29  (04/06/2010)   LDL Direct: 57.5  (04/06/2008)   HDL: 34  (04/06/2010)   Triglycerides: 226  (04/06/2010)    SGOT (AST): 27  (04/06/2010)   SGPT (ALT): 32  (04/06/2010)    Alkaline phosphatase: 57  (04/06/2010)   Total bilirubin: 0.5  (04/06/2010)  Hypertension   Last Blood Pressure: 140 / 72  (04/13/2010)   Serum creatinine: 0.92  (04/06/2010)   Serum potassium 4.7  (04/06/2010)  Self-Management Support :    Diabetes self-management support: Not documented    Hypertension self-management support: Not documented    Lipid self-management support: Not documented

## 2010-04-23 ENCOUNTER — Encounter: Payer: Self-pay | Admitting: Family Medicine

## 2010-04-23 ENCOUNTER — Ambulatory Visit (INDEPENDENT_AMBULATORY_CARE_PROVIDER_SITE_OTHER): Payer: Medicare Other | Admitting: Family Medicine

## 2010-04-23 DIAGNOSIS — R109 Unspecified abdominal pain: Secondary | ICD-10-CM | POA: Insufficient documentation

## 2010-04-26 ENCOUNTER — Ambulatory Visit (HOSPITAL_COMMUNITY): Payer: Self-pay

## 2010-04-26 NOTE — Letter (Signed)
Summary: CDL Physical   CDL Physical   Imported By: Kassie Mends 04/17/2010 10:40:12  _____________________________________________________________________  External Attachment:    Type:   Image     Comment:   External Document

## 2010-04-27 ENCOUNTER — Ambulatory Visit: Payer: Self-pay | Admitting: Cardiology

## 2010-05-01 NOTE — Assessment & Plan Note (Signed)
Summary: hernia   Vital Signs:  Patient profile:   76 year old male Weight:      205.25 pounds Temp:     98.4 degrees F oral Pulse rate:   74 / minute Pulse rhythm:   regular BP sitting:   134 / 82  (left arm) Cuff size:   large  Vitals Entered By: Selena Batten Dance CMA Duncan Dull) (April 23, 2010 10:54 AM) CC: ? Hernia   History of Present Illness: CC: ? hernia  borrowed large storage trailer over weekend, overexherted himself twisting trailer screw jack up.  Felt pain L lower quadrant.  not ripping but very noticeable pain.  very sore since then.  Worsening since then.  feeling inflammed.  worse pain with standing up.  However also with laying down.  Moving a certain way causes more pain.  Staying LLQ, no radiation - none to back, none to groin.  feels pulling.  No fevers/chills, n/v, d/c, blood in stool, other stool changes.  No urinary changes.  seems to be getting better.  Truss did not help - actually pushes too inferior causing pain.  took 4 ibuprofen which helped as well.  h/o R hernia repair 2002 as well as umbilical hernia repair (Dr. Cyd Silence).  Current Medications (verified): 1)  Metoprolol Tartrate 50 Mg Tabs (Metoprolol Tartrate) .... Take 1/2 By Mouth Two Times A Day 2)  Aspirin 325 Mg Tabs (Aspirin) .... One Daily 3)  Zetia 10 Mg Tabs (Ezetimibe) .... Take One By Mouth Daily 4)  Pravastatin Sodium 40 Mg Tabs (Pravastatin Sodium) .... Take One Nightly 5)  Viagra 100 Mg Tabs (Sildenafil Citrate) .... One Tab By Mouth 1 Hour Prior To Desired Relatiuons. 6)  Metformin Hcl 500 Mg Tabs (Metformin Hcl) .... One Tab By Mouth At Night 7)  Ambien 10 Mg Tabs (Zolpidem Tartrate) .... Take 1/2-1 Tab  By Mouth At Bedtime As Needed 8)  Finasteride 5 Mg Tabs (Finasteride) .... Take One By Mouth Daily 9)  Diazepam 2 Mg Tabs (Diazepam) .Marland Kitchen.. 1 Up To Three Times A Day As Needed 10)  Amlodipine Besylate 5 Mg Tabs (Amlodipine Besylate) .Marland Kitchen.. 1 Once Daily 11)  Prilosec Otc 20 Mg Tbec (Omeprazole  Magnesium) .... Take One 30-60 Min Before First and Last Meals of The Day 12)  Pepcid 20 Mg Tabs (Famotidine) .... Take One By Mouth At Bedtime 13)  Zostavax 16109 Unt/0.35ml Solr (Zoster Vaccine Live) .Marland Kitchen.. 1 Use As Directed 14)  Flexeril 10 Mg Tabs (Cyclobenzaprine Hcl) .... Take One By Mouth Three Times A Day As Needed Muscle Spasm  Allergies: 1)  Morphine Sulfate (Morphine Sulfate) 2)  Nitroglycerin  Past History:  Past Medical History: Last updated: 04/13/2010 CAD/MI with stents HTN HLD T2DM GERD Chronic rhinitis..............................................ENT [place]       Past Surgical History: Last updated: 10/05/2008 Choleycystectomy 1985 CABG x 6  MI Attempted Angioplasty 05/1993 R Ing and Ventr Herniorrapphy 11/2000 L ankle Fx 03/2002 Cardiolite ETT, negative ischemia,  post. poss. 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 Lungs are clear to auscultation, no crackles or wheezes. Heart:  Normal rate and regular rhythm. S1 and S2 normal without gallop, murmur, click, rub or other extra sounds. Abdomen:  somewhat protuberant abdomen.  tender to palpation LLQ about 7cm lateral to umbilicus.  No inguinal hernia appreciated.  no masses appreciated, either standing or supine.  no rebound, no guarding.  tenderness very superficial at muscle/fascia level.  some diastasis recti with sitting up Pulses:  2+ rad pulses  bilaterally Extremities:  no pedal edema bilaterally   Impression & Recommendations:  Problem # 1:  ABDOMINAL PAIN OTHER SPECIFIED SITE (ICD-789.09) ?abdominal wall strain.  treat as such with flexeril/ibuprofen, ice.  seems to be improving.  Other ddx includes rpt hernia (spigelian), less likely diverticulitis.  no appreciable hernia or masses today.  advised if red flags of worsening pain, fevers, to go to ER for eval.  if not better in 2 days of conservative treatment, low threshold for CT scan vs ultrasound.  No peritoneal signs today.  His updated medication list for this problem includes:    Aspirin 325 Mg Tabs (Aspirin) ..... One daily    Flexeril 10 Mg Tabs (Cyclobenzaprine hcl) .Marland Kitchen... Take one by mouth three times a day as needed muscle spasm  Complete Medication List: 1)  Metoprolol Tartrate 50 Mg Tabs (Metoprolol tartrate) .... Take 1/2 by mouth two times a day 2)  Aspirin 325 Mg Tabs (Aspirin) .... One daily 3)  Zetia 10 Mg Tabs (Ezetimibe) .... Take one by mouth daily 4)  Pravastatin Sodium 40 Mg Tabs (Pravastatin sodium) .... Take one nightly 5)  Viagra 100 Mg Tabs (Sildenafil citrate) .... One tab by mouth 1 hour prior to desired relatiuons. 6)  Metformin Hcl 500 Mg Tabs (Metformin hcl) .... One tab by mouth at night 7)  Ambien 10 Mg Tabs (Zolpidem tartrate) .... Take 1/2-1 tab  by mouth at bedtime as needed 8)  Finasteride 5 Mg Tabs (Finasteride) .... Take one by mouth daily 9)  Diazepam 2 Mg Tabs (Diazepam) .Marland Kitchen.. 1 up to three times a day as needed 10)  Amlodipine Besylate 5 Mg Tabs (Amlodipine besylate) .Marland Kitchen.. 1 once daily 11)  Prilosec Otc 20 Mg Tbec (Omeprazole magnesium) .... Take one 30-60 min before first and last meals of the day 12)  Pepcid 20 Mg Tabs (Famotidine) .... Take one by mouth at bedtime 13)  Zostavax 33295 Unt/0.87ml Solr (Zoster vaccine live) .Marland Kitchen.. 1 use as directed 14)  Flexeril 10 Mg Tabs (Cyclobenzaprine hcl) .... Take one by mouth three times a day as  needed muscle spasm  Patient Instructions: 1)  This is either pulled muscle or early hernia.  2)  Start treatment with course of anti inflammatory (ibuprofen) and muscle relaxant (flexeril) and ice. 3)  If not better, call us in 2 days for CT scan. 4)  If any worsening pain or fevers, stool changes, nausea or vomiting, let us know sooner. 5)  Take it easy next few days. Prescriptions: FLEXERIL 10 MG TABS (CYCLOBENZAPRINE HCL) take one by mouth three times a day as needed muscle spasm  #30 x 0   Entered and Authorized by:   Eustaquio Boyden  MD   Signed by:   Eustaquio Boyden  MD on 04/23/2010   Method used:   Electronically to        [facility]* (retail)       [address] [place], [place]  [address]  Ph: [phone]       Fax: [phone]   RxID:   [id number]    Orders Added: 1)  Est. Patient Level III [87564]    Current Allergies (reviewed today): MORPHINE SULFATE (MORPHINE SULFATE) NITROGLYCERIN  Appended Document: hernia can we call pt for update on abd pain?  if not improved, will likely recommend CT scan abd. Eustaquio Boyden  MD  April 25, 2010 10:23 PM   Spoke with patient. He said he is probably 80% better. 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 He is wearing a wide band and thinks that with time he will be healed. I advised him to call back if he starts declining instead of improving. He verbalized understanding. Kim Dance CMA Duncan Dull)  April 26, 2010 10:04 AM   noted thanks. Eustaquio Boyden  MD  April 26, 2010 12:24 PM  Clinical Lists Changes

## 2010-05-01 NOTE — Letter (Signed)
Summary: Medical Examination for [employer]   Imported By: Maryln Gottron 04/26/2010 14:08:54  _____________________________________________________________________  External Attachment:    Type:   Image     Comment:   External Document

## 2010-05-04 ENCOUNTER — Encounter: Payer: Self-pay | Admitting: Family Medicine

## 2010-05-04 DIAGNOSIS — I2510 Atherosclerotic heart disease of native coronary artery without angina pectoris: Secondary | ICD-10-CM | POA: Insufficient documentation

## 2010-05-04 DIAGNOSIS — E1169 Type 2 diabetes mellitus with other specified complication: Secondary | ICD-10-CM | POA: Insufficient documentation

## 2010-05-04 DIAGNOSIS — K219 Gastro-esophageal reflux disease without esophagitis: Secondary | ICD-10-CM | POA: Insufficient documentation

## 2010-05-04 DIAGNOSIS — E785 Hyperlipidemia, unspecified: Secondary | ICD-10-CM

## 2010-05-04 DIAGNOSIS — I10 Essential (primary) hypertension: Secondary | ICD-10-CM

## 2010-05-04 DIAGNOSIS — E119 Type 2 diabetes mellitus without complications: Secondary | ICD-10-CM

## 2010-05-04 DIAGNOSIS — E1142 Type 2 diabetes mellitus with diabetic polyneuropathy: Secondary | ICD-10-CM | POA: Insufficient documentation

## 2010-05-04 DIAGNOSIS — J310 Chronic rhinitis: Secondary | ICD-10-CM | POA: Insufficient documentation

## 2010-05-04 DIAGNOSIS — E1140 Type 2 diabetes mellitus with diabetic neuropathy, unspecified: Secondary | ICD-10-CM | POA: Insufficient documentation

## 2010-05-07 LAB — BASIC METABOLIC PANEL
Calcium: 9.1 mg/dL (ref 8.4–10.5)
Creatinine, Ser: 1.12 mg/dL (ref 0.4–1.5)
GFR calc Af Amer: >60 mL/min (ref >60)

## 2010-05-07 LAB — CBC
Platelets: 183 10*3/uL (ref 150–400)
RBC: 4.57 MIL/uL (ref 4.22–5.81)
WBC: 8.7 10*3/uL (ref 4.0–10.5)

## 2010-05-07 LAB — CARDIAC PANEL(CRET KIN+CKTOT+MB+TROPI)
CK, MB: 2.7 ng/mL (ref 0.3–4.0)
Relative Index: 1.8 (ref 0.0–2.5)
Total CK: 147 U/L (ref 7–232)
Troponin I: 0.11 ng/mL — ABNORMAL HIGH (ref 0.00–0.06)

## 2010-05-07 LAB — GLUCOSE, CAPILLARY: Glucose-Capillary: 113 mg/dL — ABNORMAL HIGH (ref 70–99)

## 2010-05-10 ENCOUNTER — Other Ambulatory Visit: Payer: Self-pay | Admitting: *Deleted

## 2010-05-10 MED ORDER — SILDENAFIL CITRATE 100 MG PO TABS: 100.0000 mg | ORAL_TABLET | Freq: Every day | ORAL | Status: DC | PRN

## 2010-05-18 ENCOUNTER — Other Ambulatory Visit: Payer: Self-pay | Admitting: *Deleted

## 2010-05-18 MED ORDER — METFORMIN HCL 500 MG PO TABS: 500.0000 mg | ORAL_TABLET | Freq: Every day | ORAL | Status: DC

## 2010-05-23 ENCOUNTER — Other Ambulatory Visit (INDEPENDENT_AMBULATORY_CARE_PROVIDER_SITE_OTHER): Payer: Medicare Other | Admitting: *Deleted

## 2010-05-23 DIAGNOSIS — Z79899 Other long term (current) drug therapy: Secondary | ICD-10-CM

## 2010-05-23 DIAGNOSIS — E7800 Pure hypercholesterolemia, unspecified: Secondary | ICD-10-CM

## 2010-05-23 LAB — HEPATIC FUNCTION PANEL
AST: 29 U/L (ref 0–37)
Albumin: 4 g/dL (ref 3.5–5.2)
Alkaline Phosphatase: 53 U/L (ref 39–117)
Bilirubin, Direct: 0.2 mg/dL (ref 0.0–0.3)
Total Protein: 6.6 g/dL (ref 6.0–8.3)

## 2010-05-23 LAB — BASIC METABOLIC PANEL
CO2: 31 mEq/L (ref 19–32)
GFR: 85.42 mL/min (ref >60.00)
Glucose, Bld: 117 mg/dL — ABNORMAL HIGH (ref 70–99)
Potassium: 4.5 mEq/L (ref 3.5–5.1)
Sodium: 142 mEq/L (ref 135–145)

## 2010-05-23 LAB — LIPID PANEL
Total CHOL/HDL Ratio: 3
VLDL: 37.2 mg/dL (ref 0.0–40.0)

## 2010-05-24 ENCOUNTER — Telehealth: Payer: Self-pay | Admitting: *Deleted

## 2010-05-24 NOTE — Telephone Encounter (Signed)
Message copied by Barnetta Hammersmith on Thu May 24, 2010 10:23 AM ------      Message from: Norma Fredrickson      Created: Thu May 24, 2010  8:04 AM       Ok to report. Labs are satisfactory.  Stay on same medicines. Recheck BMET/HPF/LIPIDS  in 6 months.      Diet/exercise/weight loss encouraged.

## 2010-05-24 NOTE — Telephone Encounter (Signed)
Patient's wife notified of pt's lab results and for pt to continue same medications.  Pt's wife will notify pt to watch diet, exercise regularly and to lose weight.  Pt will f/u with Dr. Mariah Milling in the next month.

## 2010-06-04 ENCOUNTER — Ambulatory Visit: Payer: Medicare Other | Admitting: Cardiovascular Disease

## 2010-06-06 ENCOUNTER — Telehealth: Payer: Self-pay | Admitting: Cardiology

## 2010-06-06 NOTE — Telephone Encounter (Signed)
FAX [phone] ATTEN: [facility]   LAST OV NOTE,EKG, ANY CARDIAC TESTING AND PROCEDURE ALSO INCLUDE MED LIST.

## 2010-06-07 ENCOUNTER — Ambulatory Visit (INDEPENDENT_AMBULATORY_CARE_PROVIDER_SITE_OTHER): Payer: Medicare Other | Admitting: Cardiovascular Disease

## 2010-06-07 ENCOUNTER — Encounter: Payer: Self-pay | Admitting: Cardiovascular Disease

## 2010-06-07 DIAGNOSIS — I10 Essential (primary) hypertension: Secondary | ICD-10-CM

## 2010-06-07 DIAGNOSIS — I498 Other specified cardiac arrhythmias: Secondary | ICD-10-CM

## 2010-06-07 DIAGNOSIS — Z951 Presence of aortocoronary bypass graft: Secondary | ICD-10-CM

## 2010-06-07 DIAGNOSIS — I2510 Atherosclerotic heart disease of native coronary artery without angina pectoris: Secondary | ICD-10-CM

## 2010-06-07 DIAGNOSIS — E669 Obesity, unspecified: Secondary | ICD-10-CM

## 2010-06-07 DIAGNOSIS — E785 Hyperlipidemia, unspecified: Secondary | ICD-10-CM

## 2010-06-07 DIAGNOSIS — R001 Bradycardia, unspecified: Secondary | ICD-10-CM

## 2010-06-07 MED ORDER — METOPROLOL TARTRATE 25 MG PO TABS: 25.0000 mg | ORAL_TABLET | Freq: Two times a day (BID) | ORAL | Status: DC

## 2010-06-07 NOTE — Assessment & Plan Note (Signed)
We have encouraged continued exercise, careful diet management in an effort to lose weight. 

## 2010-06-07 NOTE — Assessment & Plan Note (Signed)
History bypass surgery, stenting to his RCA graft in June 2011. Tongue midline well with no suggestion of worsening ischemia. Breathing is stable. We have encouraged him to increase his exercise and stay on his current medications.

## 2010-06-07 NOTE — Progress Notes (Signed)
   Patient ID: Aaron Stafford, male    DOB: [birth year], 76 y.o.   MRN: [id number]  HPI Comments: Mr. Swager is a very pleasant 76 year old gentleman with a history of coronary artery disease,  bypass surgery, With LIMA graft to the LAD, vein graft to the RCA, vein graft to the OM with previous stent placed to the vein graft to the RCA in June 2011, hyperlipidemia, hypertension who presents to establish care. 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   He reports that since the stent was placed last year, he has been doing well. He denies any significant shortness of breath or chest pain. He does not exercise on a regular basis.  He reports having a problem with bradycardia in the past. He has had heart rates in the 40s back in June of last year. He denies any lightheadedness or dizziness but does have some chronic fatigue.  EKG shows sinus bradycardia with rate 51 beats per minute with interventricular conduction delay, poor R-wave progression through the precordial leads, nonspecific ST changes in one, aVL, left axis deviation     Review of Systems  Constitutional: Positive for fatigue.  HENT: Negative.   Eyes: Negative.   Respiratory: Negative.   Cardiovascular: Negative.   Gastrointestinal: Negative.   Musculoskeletal: Negative.   Skin: Negative.   Neurological: Negative.   Hematological: Negative.   Psychiatric/Behavioral: Negative.   All other systems reviewed and are negative.    BP 128/68  Pulse 51  Ht 5\' 9"  (1.753 m)  Wt 207 lb 12.8 oz (94.257 kg)  BMI 30.69 kg/m2   Physical Exam  Nursing note and vitals reviewed. Constitutional: He is oriented to person, place, and time. He appears well-developed and well-nourished.  HENT:  Head: Normocephalic.  Nose: Nose normal.  Mouth/Throat: Oropharynx is clear and moist.  Eyes: Conjunctivae are normal. Pupils are equal, round, and reactive to light.  Neck: Normal range of motion. Neck supple. No JVD present.  Cardiovascular: Normal  rate, regular rhythm, S1 normal, S2 normal, normal heart sounds and intact distal pulses.  Exam reveals no gallop and no friction rub.   No murmur heard. Pulmonary/Chest: Effort normal and breath sounds normal. No respiratory distress. He has no wheezes. He has no rales. He exhibits no tenderness.  Abdominal: Soft. Bowel sounds are normal. He exhibits no distension. There is no tenderness.  Musculoskeletal: Normal range of motion. He exhibits edema. He exhibits no tenderness.  Lymphadenopathy:    He has no cervical adenopathy.  Neurological: He is alert and oriented to person, place, and time. Coordination normal.  Skin: Skin is warm and dry. No rash noted. No erythema.  Psychiatric: He has a normal mood and affect. His behavior is normal. Judgment and thought content normal.           Assessment and Plan

## 2010-06-07 NOTE — Assessment & Plan Note (Signed)
Cholesterol is at goal on the current lipid regimen. No changes to the medications were made.  

## 2010-06-07 NOTE — Patient Instructions (Addendum)
You are doing well. Please change the metoprolol to 12.5 mg in the AM and PM. Please call us if you have new issues that need to be addressed before your next appt.  We will call you for a follow up Appt in 6 months.

## 2010-06-07 NOTE — Assessment & Plan Note (Signed)
Blood pressure is well controlled on his current medication regimen. No changes were made 

## 2010-06-07 NOTE — Assessment & Plan Note (Signed)
He has significant bradycardia today. We will decrease his metoprolol tartrate 12.5 mg b.i.d.. He does report having fatigue. Hopefully a higher heart rate may help the symptoms. I have asked him to monitor his blood pressure and heart rate at home.

## 2010-06-18 ENCOUNTER — Encounter: Payer: Self-pay | Admitting: Cardiovascular Disease

## 2010-06-20 ENCOUNTER — Encounter: Payer: Self-pay | Admitting: Cardiovascular Disease

## 2010-09-03 ENCOUNTER — Other Ambulatory Visit: Payer: Self-pay | Admitting: *Deleted

## 2010-09-03 MED ORDER — EZETIMIBE 10 MG PO TABS: 10.0000 mg | ORAL_TABLET | Freq: Every day | ORAL | Status: DC

## 2010-09-17 ENCOUNTER — Other Ambulatory Visit: Payer: Self-pay | Admitting: *Deleted

## 2010-09-17 MED ORDER — EZETIMIBE 10 MG PO TABS: 10.0000 mg | ORAL_TABLET | Freq: Every day | ORAL | Status: DC

## 2010-09-25 ENCOUNTER — Other Ambulatory Visit: Payer: Self-pay | Admitting: *Deleted

## 2010-09-25 MED ORDER — ZOLPIDEM TARTRATE 10 MG PO TABS: 5.0000 mg | ORAL_TABLET | Freq: Every evening | ORAL | Status: DC | PRN

## 2010-09-25 NOTE — Telephone Encounter (Signed)
Ok to refill. plz phone in and notify pt. 

## 2010-09-26 NOTE — Telephone Encounter (Signed)
Rx called in as directed.   

## 2010-10-10 ENCOUNTER — Other Ambulatory Visit: Payer: Self-pay | Admitting: *Deleted

## 2010-10-10 MED ORDER — FINASTERIDE 5 MG PO TABS: 5.0000 mg | ORAL_TABLET | Freq: Every day | ORAL | Status: DC

## 2010-10-12 ENCOUNTER — Ambulatory Visit (INDEPENDENT_AMBULATORY_CARE_PROVIDER_SITE_OTHER): Payer: Medicare Other | Admitting: Family Medicine

## 2010-10-12 ENCOUNTER — Encounter: Payer: Self-pay | Admitting: Family Medicine

## 2010-10-12 ENCOUNTER — Ambulatory Visit: Payer: Medicare Other | Admitting: Family Medicine

## 2010-10-12 VITALS — BP 122/62 | HR 51 | Temp 97.8°F | Wt 207.8 lb

## 2010-10-12 DIAGNOSIS — E119 Type 2 diabetes mellitus without complications: Secondary | ICD-10-CM

## 2010-10-12 DIAGNOSIS — E785 Hyperlipidemia, unspecified: Secondary | ICD-10-CM

## 2010-10-12 DIAGNOSIS — I2510 Atherosclerotic heart disease of native coronary artery without angina pectoris: Secondary | ICD-10-CM

## 2010-10-12 DIAGNOSIS — I10 Essential (primary) hypertension: Secondary | ICD-10-CM

## 2010-10-12 DIAGNOSIS — G4700 Insomnia, unspecified: Secondary | ICD-10-CM

## 2010-10-12 LAB — BASIC METABOLIC PANEL
CO2: 29 mEq/L (ref 19–32)
Calcium: 9.3 mg/dL (ref 8.4–10.5)
Creatinine, Ser: 1 mg/dL (ref 0.4–1.5)
Sodium: 142 mEq/L (ref 135–145)

## 2010-10-12 LAB — MICROALBUMIN / CREATININE URINE RATIO
Creatinine,U: 162.5 mg/dL
Microalb Creat Ratio: 1.4 mg/g (ref 0.0–30.0)
Microalb, Ur: 2.3 mg/dL — ABNORMAL HIGH (ref 0.0–1.9)

## 2010-10-12 LAB — HEMOGLOBIN A1C: Hgb A1c MFr Bld: 6.4 % (ref 4.6–6.5)

## 2010-10-12 MED ORDER — TRAZODONE HCL 50 MG PO TABS: 50.0000 mg | ORAL_TABLET | Freq: Every day | ORAL | Status: DC

## 2010-10-12 MED ORDER — METOPROLOL TARTRATE 25 MG PO TABS: 12.5000 mg | ORAL_TABLET | Freq: Two times a day (BID) | ORAL | Status: DC

## 2010-10-12 NOTE — Patient Instructions (Signed)
Decrease metoprolol to 12.5mg  twice daily (1/2 tab). Stop Remus Loffler - try trazodone 50mg  nightly. Return in 6 months for physical, prior for blood work. Check with insurance about shingles shot and where they prefer you get done.

## 2010-10-12 NOTE — Assessment & Plan Note (Signed)
Stable on current meds.  Continues bradycardic, decrease metoprolol to 12.5mg  bid.

## 2010-10-12 NOTE — Assessment & Plan Note (Addendum)
Stable.  check A1c today.  Foot exam today. Due for vision screen.

## 2010-10-12 NOTE — Assessment & Plan Note (Addendum)
Stop ambien.  Start trazodone.  Sleep maintenance insomnia, if no improvement, consider silenor.

## 2010-10-12 NOTE — Assessment & Plan Note (Signed)
Had FLP checked 05/2010.  HDL low.  Stopped lovaza 2/2 severe GERD epsiodes had previously had.  May rec restart in future if tolerated.

## 2010-10-12 NOTE — Progress Notes (Signed)
  Subjective:    Patient ID: Aaron Stafford, male    DOB: January 08, 1935, 76 y.o.   MRN: [id number]  HPI CC: 6 mo f/u  Insomnia - using something nightly - usually ambien.  Bedtime at 11:30pm, no problems with sleep initiation.  Tends to wake up at 1:30am then trouble falling back asleep.  No worry.  Using ambien nightly but feels hang over afterwards.  Interested in trazodone.  HTN - good control.  No HA, vision changes, CP/tightness, SOB.  HR 51.  On metoprolol 25mg  twice daily.  Some leg swelling noted. Wt Readings from Last 3 Encounters:  10/12/10 207 lb 12 oz (94.235 kg)  06/07/10 207 lb 12.8 oz (94.257 kg)  04/23/10 205 lb 4 oz (93.101 kg)   HLD - stable on current meds, no myalgias.  DM - evening sugars running 160-170.  No paresthesias in feet.  Some sensation of pressure/pain.  Foot exam today.  Vision screen done 06/2009. Lab Results  Component Value Date   HGBA1C 5.6 04/06/2010   Review of Systems Per HPI    Objective:   Physical Exam  Nursing note and vitals reviewed. Constitutional: He appears well-developed and well-nourished. No distress.  HENT:  Head: Normocephalic and atraumatic.  Right Ear: External ear normal.  Left Ear: External ear normal.  Nose: Nose normal.  Mouth/Throat: Oropharynx is clear and moist. No oropharyngeal exudate.  Eyes: Conjunctivae and EOM are normal. Pupils are equal, round, and reactive to light. No scleral icterus.  Neck: Normal range of motion. Neck supple. Carotid bruit is not present.  Cardiovascular: Normal rate, regular rhythm and intact distal pulses.   Murmur (mild SEM RUSB) heard. Pulmonary/Chest: Effort normal and breath sounds normal. No respiratory distress. He has no wheezes. He has no rales.  Abdominal: Soft. Bowel sounds are normal. He exhibits no distension. There is no tenderness. There is no rebound and no guarding.       No abd/renal bruit   Musculoskeletal: He exhibits edema (mild nonpitting).       Diabetic foot  exam: Normal inspection No skin breakdown, + some dry/scaling soles No calluses  Normal DP/PT pulses Normal sensation to light touch and monofilament Nails thickened, some fungal infection  Lymphadenopathy:    He has no cervical adenopathy.  Skin: Skin is warm and dry. No rash noted.  Psychiatric: He has a normal mood and affect.          Assessment & Plan:

## 2010-11-05 ENCOUNTER — Other Ambulatory Visit: Payer: Self-pay | Admitting: *Deleted

## 2010-11-05 MED ORDER — PRAVASTATIN SODIUM 40 MG PO TABS: 40.0000 mg | ORAL_TABLET | Freq: Every day | ORAL | Status: DC

## 2010-11-13 ENCOUNTER — Telehealth: Payer: Self-pay | Admitting: *Deleted

## 2010-11-13 MED ORDER — ZOSTER VACCINE LIVE 19400 UNT/0.65ML ~~LOC~~ SOLR: 0.6500 mL | Freq: Once | SUBCUTANEOUS | Status: DC

## 2010-11-13 NOTE — Telephone Encounter (Signed)
Patient called and requested the shingles vaccine to be sent to [facility].

## 2010-11-13 NOTE — Telephone Encounter (Signed)
Patient notified

## 2010-11-13 NOTE — Telephone Encounter (Signed)
Notify sent in. 

## 2010-12-03 ENCOUNTER — Encounter: Payer: Self-pay | Admitting: Cardiovascular Disease

## 2010-12-05 ENCOUNTER — Encounter: Payer: Self-pay | Admitting: Cardiovascular Disease

## 2010-12-05 ENCOUNTER — Ambulatory Visit (INDEPENDENT_AMBULATORY_CARE_PROVIDER_SITE_OTHER): Payer: Medicare Other | Admitting: Cardiovascular Disease

## 2010-12-05 ENCOUNTER — Telehealth: Payer: Self-pay | Admitting: *Deleted

## 2010-12-05 DIAGNOSIS — E119 Type 2 diabetes mellitus without complications: Secondary | ICD-10-CM

## 2010-12-05 DIAGNOSIS — R001 Bradycardia, unspecified: Secondary | ICD-10-CM

## 2010-12-05 DIAGNOSIS — G4700 Insomnia, unspecified: Secondary | ICD-10-CM

## 2010-12-05 DIAGNOSIS — E785 Hyperlipidemia, unspecified: Secondary | ICD-10-CM

## 2010-12-05 DIAGNOSIS — I498 Other specified cardiac arrhythmias: Secondary | ICD-10-CM

## 2010-12-05 NOTE — Patient Instructions (Signed)
You are doing well. No medication changes were made.  Please call us if you have new issues that need to be addressed before your next appt.  The office will contact you for a follow up Appt. In 6 months  

## 2010-12-05 NOTE — Assessment & Plan Note (Signed)
Currently with no symptoms of angina. No further workup at this time. Continue current medication regimen. 

## 2010-12-05 NOTE — Assessment & Plan Note (Signed)
Cholesterol is at goal on the current lipid regimen. No changes to the medications were made.  

## 2010-12-05 NOTE — Assessment & Plan Note (Signed)
He continues to have bradycardia with heart rate of 51 beats per minute on low-dose metoprolol. Some continued mild fatigue. We have suggested he hold the metoprolol for now to see if his fatigue improves. He can monitor his heart rate and blood pressure and call our office if these change significantly.

## 2010-12-05 NOTE — Progress Notes (Signed)
Patient ID: Aaron Stafford, male    DOB: [birth year], 76 y.o.   MRN: [id number]  HPI Comments: Aaron Stafford is a very pleasant 76 year old gentleman with a history of coronary artery disease,  bypass surgery, With LIMA graft to the LAD, vein graft to the RCA, vein graft to the OM with previous stent placed to the vein graft to the RCA in June 2011, hyperlipidemia, hypertension who presents for routine follow up. 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   He reports that since the stent was placed last year, he has been doing well. He denies any significant shortness of breath or chest pain. He does not exercise on a regular basis.  On his last clinic visit, we decreased his metoprolol tartrate to 12.5 mg b.i.d. Secondary to fatigue and bradycardia. He thinks that the decrease in medication may have helped some of his fatigue. He continues to be very active, working in [employer] and otherwise feels well.    EKG shows sinus bradycardia with rate 51 beats per minute with interventricular conduction delay, poor R-wave progression through the precordial leads, nonspecific ST changes in one, aVL, left axis deviation   Outpatient Encounter Prescriptions as of 12/05/2010  Medication Sig Dispense Refill  . amLODipine (NORVASC) 5 MG tablet Take 5 mg by mouth daily.        Marland Kitchen aspirin 81 MG tablet Take 81 mg by mouth daily.        Marland Kitchen ezetimibe (ZETIA) 10 MG tablet Take 1 tablet (10 mg total) by mouth daily.  30 tablet  5  . finasteride (PROSCAR) 5 MG tablet Take 5 mg by mouth daily.        . furosemide (LASIX) 20 MG tablet Take 20 mg by mouth daily.        . hydrochlorothiazide 25 MG tablet Take 25 mg by mouth daily.        . metFORMIN (GLUCOPHAGE) 500 MG tablet Take 1 tablet (500 mg total) by mouth at bedtime.  30 tablet  11  . metoprolol tartrate (LOPRESSOR) 25 MG tablet Take 0.5 tablets (12.5 mg total) by mouth 2 (two) times daily.  60 tablet  6  . pravastatin (PRAVACHOL) 40 MG tablet Take 1 tablet (40 mg total)  by mouth at bedtime.  30 tablet  4  . sildenafil (VIAGRA) 100 MG tablet Take 1 tablet (100 mg total) by mouth daily as needed.  10 tablet  5  . traZODone (DESYREL) 50 MG tablet Take 50 mg by mouth at bedtime.        . zoster vaccine live, PF, (ZOSTAVAX) 14782 UNT/0.65ML injection Inject 19,400 Units into the skin once.  1 vial  0     Review of Systems  Constitutional: Positive for fatigue.  HENT: Negative.   Eyes: Negative.   Respiratory: Negative.   Cardiovascular: Negative.   Gastrointestinal: Negative.   Musculoskeletal: Negative.   Skin: Negative.   Neurological: Negative.   Hematological: Negative.   Psychiatric/Behavioral: Negative.   All other systems reviewed and are negative.    BP 130/80  Pulse 51  Ht 5\' 8"  (1.727 m)  Wt 204 lb (92.534 kg)  BMI 31.02 kg/m2   Physical Exam  Nursing note and vitals reviewed. Constitutional: He is oriented to person, place, and time. He appears well-developed and well-nourished.  HENT:  Head: Normocephalic.  Nose: Nose normal.  Mouth/Throat: Oropharynx is clear and moist.  Eyes: Conjunctivae are normal. Pupils are equal, round, and reactive to  light.  Neck: Normal range of motion. Neck supple. No JVD present.  Cardiovascular: Normal rate, regular rhythm, S1 normal, S2 normal, normal heart sounds and intact distal pulses.  Exam reveals no gallop and no friction rub.   No murmur heard. Pulmonary/Chest: Effort normal and breath sounds normal. No respiratory distress. He has no wheezes. He has no rales. He exhibits no tenderness.  Abdominal: Soft. Bowel sounds are normal. He exhibits no distension. There is no tenderness.  Musculoskeletal: Normal range of motion. He exhibits no edema and no tenderness.  Lymphadenopathy:    He has no cervical adenopathy.  Neurological: He is alert and oriented to person, place, and time. Coordination normal.  Skin: Skin is warm and dry. No rash noted. No erythema.  Psychiatric: He has a normal mood  and affect. His behavior is normal. Judgment and thought content normal.           Assessment and Plan

## 2010-12-05 NOTE — Assessment & Plan Note (Signed)
We have encouraged continued exercise, careful diet management in an effort to lose weight. 

## 2010-12-05 NOTE — Assessment & Plan Note (Signed)
Insomnia is improved on trazodone

## 2011-01-24 NOTE — Telephone Encounter (Signed)
Opened in error

## 2011-03-07 ENCOUNTER — Other Ambulatory Visit: Payer: Self-pay | Admitting: Cardiovascular Disease

## 2011-03-07 MED ORDER — HYDROCHLOROTHIAZIDE 25 MG PO TABS: 25.0000 mg | ORAL_TABLET | Freq: Every day | ORAL | Status: DC

## 2011-03-11 ENCOUNTER — Other Ambulatory Visit: Payer: Self-pay | Admitting: *Deleted

## 2011-03-11 MED ORDER — TRAZODONE HCL 50 MG PO TABS: 50.0000 mg | ORAL_TABLET | Freq: Every day | ORAL | Status: DC

## 2011-03-11 MED ORDER — FINASTERIDE 5 MG PO TABS: 5.0000 mg | ORAL_TABLET | Freq: Every day | ORAL | Status: DC

## 2011-03-14 ENCOUNTER — Other Ambulatory Visit: Payer: Self-pay | Admitting: Family Medicine

## 2011-03-14 DIAGNOSIS — E785 Hyperlipidemia, unspecified: Secondary | ICD-10-CM

## 2011-03-14 DIAGNOSIS — E559 Vitamin D deficiency, unspecified: Secondary | ICD-10-CM

## 2011-03-14 DIAGNOSIS — I10 Essential (primary) hypertension: Secondary | ICD-10-CM

## 2011-03-14 DIAGNOSIS — Z125 Encounter for screening for malignant neoplasm of prostate: Secondary | ICD-10-CM

## 2011-03-14 DIAGNOSIS — E119 Type 2 diabetes mellitus without complications: Secondary | ICD-10-CM

## 2011-03-20 ENCOUNTER — Encounter: Payer: Self-pay | Admitting: Family Medicine

## 2011-03-20 ENCOUNTER — Other Ambulatory Visit (INDEPENDENT_AMBULATORY_CARE_PROVIDER_SITE_OTHER): Payer: Medicare Other

## 2011-03-20 ENCOUNTER — Ambulatory Visit (INDEPENDENT_AMBULATORY_CARE_PROVIDER_SITE_OTHER): Payer: Medicare Other | Admitting: Family Medicine

## 2011-03-20 VITALS — BP 140/84 | HR 64 | Temp 97.7°F | Wt 204.2 lb

## 2011-03-20 DIAGNOSIS — Z125 Encounter for screening for malignant neoplasm of prostate: Secondary | ICD-10-CM | POA: Diagnosis not present

## 2011-03-20 DIAGNOSIS — E559 Vitamin D deficiency, unspecified: Secondary | ICD-10-CM

## 2011-03-20 DIAGNOSIS — E785 Hyperlipidemia, unspecified: Secondary | ICD-10-CM | POA: Diagnosis not present

## 2011-03-20 DIAGNOSIS — E119 Type 2 diabetes mellitus without complications: Secondary | ICD-10-CM | POA: Diagnosis not present

## 2011-03-20 DIAGNOSIS — M542 Cervicalgia: Secondary | ICD-10-CM | POA: Insufficient documentation

## 2011-03-20 LAB — COMPREHENSIVE METABOLIC PANEL
AST: 26 U/L (ref 0–37)
Albumin: 4.3 g/dL (ref 3.5–5.2)
Alkaline Phosphatase: 62 U/L (ref 39–117)
BUN: 17 mg/dL (ref 6–23)
Creatinine, Ser: 0.9 mg/dL (ref 0.4–1.5)
Glucose, Bld: 135 mg/dL — ABNORMAL HIGH (ref 70–99)
Potassium: 3.8 mEq/L (ref 3.5–5.1)
Total Bilirubin: 0.8 mg/dL (ref 0.3–1.2)

## 2011-03-20 LAB — LIPID PANEL
HDL: 36.8 mg/dL — ABNORMAL LOW (ref >39.00)
Triglycerides: 214 mg/dL — ABNORMAL HIGH (ref 0.0–149.0)
VLDL: 42.8 mg/dL — ABNORMAL HIGH (ref 0.0–40.0)

## 2011-03-20 LAB — HEMOGLOBIN A1C: Hgb A1c MFr Bld: 6.3 % (ref 4.6–6.5)

## 2011-03-20 LAB — LDL CHOLESTEROL, DIRECT: Direct LDL: 56 mg/dL

## 2011-03-20 MED ORDER — CYCLOBENZAPRINE HCL 5 MG PO TABS: ORAL_TABLET | ORAL | Status: DC

## 2011-03-20 MED ORDER — METOPROLOL TARTRATE 25 MG PO TABS: 12.5000 mg | ORAL_TABLET | Freq: Two times a day (BID) | ORAL | Status: DC

## 2011-03-20 NOTE — Assessment & Plan Note (Signed)
Anticipate right cervical neck strain/spasm. Treat with flexeril as needed. Continue ibuprofen as well as ice/heat. Provided with stretching exercises from [facility] pt advisor for neck spasm.

## 2011-03-20 NOTE — Patient Instructions (Signed)
I think you have muscle spasm at right trapezius muscle. Use flexeril as needed for muscle spasm. Use ice/heat alternating (and then whichever soothes better) Continue ibuprofen. Let us know if not improving. I've refilled metoprolol

## 2011-03-20 NOTE — Progress Notes (Signed)
  Subjective:    Patient ID: Aaron Stafford, male    DOB: [birth year], 77 y.o.   MRN: [id number]  HPI CC: neck pain  Significant stress last 3 weeks - wife's sister ill recently, died 07-29-2022.  Wonders if this is contributing.  1d h/o sharp severe R shoulder/neck pain.  Located between shoulder and neck.  Started last night.  trouble turning over in bed.  Denies neck pain or shooting pain down arm.  No numbness, weakness, paresthesias.  positional pain.    Denies inciting trauma, falls, injury.  Kept him up - took 4 ibuprofen and finally went to sleep at 3am  Requests refill of metoprolol takes 1/2 pill bid.  Review of Systems Per HPI    Objective:   Physical Exam  Nursing note and vitals reviewed. Constitutional: He appears well-developed and well-nourished. No distress.  Musculoskeletal: He exhibits no edema.       Right shoulder: He exhibits tenderness.       Left shoulder: Normal.       No midline spine tenderness. Decreased ROM to left 2/2 pain/pulling at neck FROM at [place] No pain or weakness with testing of RTC against resistance (neg empty can sign, int/ext rotation against resistance). Tender to palpation deep trap/levator at right neck.  Tight muscle and some spasm  Neurological: He is alert. He has normal strength. No sensory deficit. He exhibits normal muscle tone.  Skin: Skin is warm and dry. No rash noted.       Assessment & Plan:

## 2011-03-21 LAB — VITAMIN D 25 HYDROXY (VIT D DEFICIENCY, FRACTURES): Vit D, 25-Hydroxy: 52 ng/mL (ref 30–89)

## 2011-03-21 LAB — PSA, MEDICARE: PSA: 0.05 ng/mL (ref <=4.00)

## 2011-03-25 ENCOUNTER — Encounter: Payer: Self-pay | Admitting: Family Medicine

## 2011-03-25 ENCOUNTER — Ambulatory Visit (INDEPENDENT_AMBULATORY_CARE_PROVIDER_SITE_OTHER): Payer: Medicare Other | Admitting: Family Medicine

## 2011-03-25 VITALS — BP 146/80 | HR 64 | Temp 97.8°F | Ht 67.5 in | Wt 205.2 lb

## 2011-03-25 DIAGNOSIS — N529 Male erectile dysfunction, unspecified: Secondary | ICD-10-CM | POA: Diagnosis not present

## 2011-03-25 DIAGNOSIS — E559 Vitamin D deficiency, unspecified: Secondary | ICD-10-CM | POA: Diagnosis not present

## 2011-03-25 DIAGNOSIS — R109 Unspecified abdominal pain: Secondary | ICD-10-CM | POA: Diagnosis not present

## 2011-03-25 DIAGNOSIS — Z0000 Encounter for general adult medical examination without abnormal findings: Secondary | ICD-10-CM | POA: Insufficient documentation

## 2011-03-25 DIAGNOSIS — F528 Other sexual dysfunction not due to a substance or known physiological condition: Secondary | ICD-10-CM

## 2011-03-25 MED ORDER — SILDENAFIL CITRATE 100 MG PO TABS: 100.0000 mg | ORAL_TABLET | Freq: Every day | ORAL | Status: DC | PRN

## 2011-03-25 MED ORDER — DOCUSATE SODIUM 100 MG PO CAPS: 100.0000 mg | ORAL_CAPSULE | Freq: Every day | ORAL | Status: AC

## 2011-03-25 NOTE — Assessment & Plan Note (Signed)
Improved on recheck, on vit D supplement.

## 2011-03-25 NOTE — Progress Notes (Signed)
Subjective:    Patient ID: Aaron Stafford, male    DOB: 1934/05/20, 77 y.o.   MRN: [id number]  HPI CC: medicare wellness visit.  abd - lower aches intermittently.  Has been told has 3 hernias, 2 repaired (umbilical and right inguinal).  Told has residual left side hernia that was not repaired.  Also with h/o diverticulosis but no BM changes, no fever.  Notes fried foods cause abd discomfort.  BM every 2 days, some constipation.  H/o hemorrhoids.  preventative:  last tetanus 2011. Flu shot this year at [place]. prostate - PSA reassuring, due for DRE.  colonoscopy 2007 - diverticulosis. Denies depression/anhedonia.  No falls in last year. Completed pneumonia and shingles shot. Discussed advanced directives. wife/son are pt's HCPOA, advanced directive is at home.  Asked to bring copy. Recent DOT beginning of 02/2011, states had vision and hearing done then, normal. Had hearing and vision, normal per pt earlier this year for DOT.  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  Past histories reviewed and updated if relevant as below. Patient Active Problem List  Diagnoses  . NEOP, MALIGNANT, SKIN, EAR  . UNSPECIFIED VITAMIN D DEFICIENCY  . OBESITY  . ERECTILE DYSFUNCTION  . CORONARY ARTERY DISEASE  . IRRITABLE BOWEL SYNDROME  . BACK PAIN  . FOOT PAIN, BILATERAL  . INSOMNIA  . INCONTINENCE, URGE  . CHOKING  . BENIGN PROSTATIC HYPERTROPHY, HX OF  . HTN (hypertension)  . HLD (hyperlipidemia)  . Diabetes mellitus type II  . GERD (gastroesophageal reflux disease)  . Chronic rhinitis  . S/P CABG (coronary artery bypass graft)  . Bradycardia  . Cervical pain (neck)   Past Medical History  Diagnosis Date  . CAD (coronary  artery disease)     with stents  . HTN (hypertension)   . HLD (hyperlipidemia)   . Diabetes mellitus type II   . GERD (gastroesophageal reflux disease)   . Chronic rhinitis     ENT [place]  . Ischemic heart disease   . MI (myocardial infarction) 1995   Past Surgical History  Procedure Date  . Cholecystectomy 1985  . Coronary artery bypass graft 05/1993    x6 MI Attemped angioplasty  . Inguinal hernia repair 11/2000    and ventral hernia repair  . Cystoscopy 10/03/08    Normal-Dr. Lonna Cobb  . Ankle surgery 03/2002    Left ankle fracture  . Carotid stent 07/2009    RCA stent   History  Substance Use Topics  . Smoking status: Never Smoker   . Smokeless tobacco: Never Used  . Alcohol Use: No   Family History  Problem Relation Age of Onset  . Kidney failure Mother   . Coronary artery disease Mother   . Heart failure Mother   . Hypertension Brother   . Heart attack Brother   . Alcohol abuse Brother   . Hypertension Brother   . Hypertension Sister    Allergies  Allergen Reactions  . Morphine Sulfate     REACTION: Hallucinations  . Nitroglycerin     REACTION: Decreased BP   Current Outpatient Prescriptions on File Prior to Visit  Medication Sig Dispense Refill  . amLODipine (NORVASC) 5 MG tablet Take 5 mg by mouth daily.        Marland Kitchen  aspirin 81 MG tablet Take 81 mg by mouth daily.        Marland Kitchen ezetimibe (ZETIA) 10 MG tablet Take 1 tablet (10 mg total) by mouth daily.  30 tablet  5  . finasteride (PROSCAR) 5 MG tablet Take 1 tablet (5 mg total) by mouth daily.  30 tablet  3  . furosemide (LASIX) 20 MG tablet Take 20 mg by mouth daily.        . hydrochlorothiazide (HYDRODIURIL) 25 MG tablet Take 1 tablet (25 mg total) by mouth daily.  30 tablet  5  . metFORMIN (GLUCOPHAGE) 500 MG tablet Take 1 tablet (500 mg total) by mouth at bedtime.  30 tablet  11  . metoprolol tartrate (LOPRESSOR) 25 MG tablet Take 0.5 tablets (12.5 mg total) by mouth 2 (two) times daily.  90 tablet  3  .  pravastatin (PRAVACHOL) 40 MG tablet Take 1 tablet (40 mg total) by mouth at bedtime.  30 tablet  4  . sildenafil (VIAGRA) 100 MG tablet Take 1 tablet (100 mg total) by mouth daily as needed.  10 tablet  5  . traZODone (DESYREL) 50 MG tablet Take 1 tablet (50 mg total) by mouth at bedtime.  30 tablet  6  . cyclobenzaprine (FLEXERIL) 5 MG tablet Take 1-2 tablets twice daily as needed for muscle spasm  30 tablet  1   Review of Systems  Constitutional: Negative for fever, chills, activity change, appetite change, fatigue and unexpected weight change.  HENT: Negative for hearing loss and neck pain.   Eyes: Negative for visual disturbance.  Respiratory: Negative for cough, choking, chest tightness, shortness of breath and wheezing.   Cardiovascular: Negative for chest pain, palpitations and leg swelling.  Gastrointestinal: Positive for abdominal pain. Negative for nausea, vomiting, diarrhea, constipation, blood in stool and abdominal distention.  Genitourinary: Negative for hematuria and difficulty urinating.  Musculoskeletal: Negative for myalgias and arthralgias.  Skin: Negative for rash.  Neurological: Negative for dizziness, seizures, syncope and headaches.  Hematological: Does not bruise/bleed easily.  Psychiatric/Behavioral: Negative for dysphoric mood. The patient is not nervous/anxious.        Objective:   Physical Exam  Nursing note and vitals reviewed. Constitutional: He is oriented to person, place, and time. He appears well-developed and well-nourished. No distress.  HENT:  Head: Normocephalic and atraumatic.  Right Ear: External ear normal.  Left Ear: External ear normal.  Nose: Nose normal.  Mouth/Throat: Oropharynx is clear and moist. No oropharyngeal exudate.  Eyes: Conjunctivae and EOM are normal. Pupils are equal, round, and reactive to light. No scleral icterus.  Neck: Normal range of motion. Neck supple.  Cardiovascular: Normal rate, regular rhythm, normal heart sounds  and intact distal pulses.   No murmur heard. Pulses:      Radial pulses are 2+ on the right side, and 2+ on the left side.  Pulmonary/Chest: Effort normal and breath sounds normal. No respiratory distress. He has no wheezes. He has no rales.  Abdominal: Soft. Bowel sounds are normal. He exhibits no distension and no mass. There is no hepatosplenomegaly. There is no tenderness. There is no rebound, no guarding and no CVA tenderness. No hernia. Hernia confirmed negative in the right inguinal area and confirmed negative in the left inguinal area.       Large abdomen  Genitourinary: Prostate normal and penis normal. Rectal exam shows external hemorrhoid (nonirritated, noninflammed). Rectal exam shows no internal hemorrhoid, no fissure, no mass, no tenderness and anal tone normal. Guaiac negative  stool. Prostate is not enlarged and not tender. No phimosis or penile tenderness.  Musculoskeletal: Normal range of motion.  Lymphadenopathy:    He has no cervical adenopathy.       Right: No inguinal adenopathy present.       Left: No inguinal adenopathy present.  Neurological: He is alert and oriented to person, place, and time.       CN grossly intact, station and gait intact  Skin: Skin is warm and dry. No rash noted.  Psychiatric: He has a normal mood and affect. His behavior is normal. Judgment and thought content normal.      Assessment & Plan:

## 2011-03-25 NOTE — Assessment & Plan Note (Signed)
Refilled viagra

## 2011-03-25 NOTE — Assessment & Plan Note (Addendum)
I have personally reviewed the Medicare Annual Wellness questionnaire and have noted 1. The patient's medical and social history 2. Their use of alcohol, tobacco or illicit drugs 3. Their current medications and supplements 4. The patient's functional ability including ADL's, fall risks, home safety risks and hearing or visual impairment. 5. Diet and physical activities 6. Evidence for depression or mood disorders The patients weight, height, BMI have been recorded in the chart.  Hearing and vision has been addressed. I have made referrals, counseling and provided education to the patient based review of the above and I have provided the pt with a written personalized care plan for preventive services.  Asked to bring copy of advanced directive for file. Reviewed blood work in detail, discussed lipid levels as well as increased aerobic exercise. See scanned medicare wellness form

## 2011-03-25 NOTE — Assessment & Plan Note (Signed)
No evidence of hernia on exam today. Endorses some longstanding constipation that worsens hemorrhoids. rec start colace 100mg  daily.  Update [place] if discomfort deteriorating, for referral to surgery as pt states h/o L inguinal hernia.

## 2011-03-25 NOTE — Patient Instructions (Signed)
Check on eye exam date. Good to see you today. Let me know if abdominal discomfort worsening, and I will refer you to surgeon. Blood work reviewed today - looking good. Remember - aerobic exercise will raise good cholesterol levels. Call us with questions. Return in 6 months for recheck diabetes.

## 2011-04-08 DIAGNOSIS — H35379 Puckering of macula, unspecified eye: Secondary | ICD-10-CM | POA: Diagnosis not present

## 2011-04-12 ENCOUNTER — Other Ambulatory Visit: Payer: Self-pay

## 2011-04-12 MED ORDER — TRAZODONE HCL 50 MG PO TABS: 50.0000 mg | ORAL_TABLET | Freq: Every day | ORAL | Status: DC

## 2011-04-12 NOTE — Telephone Encounter (Signed)
Received fax refill request.  Patient is wanting to change quantity received to 90 at a time.  Presciption changed and resent.

## 2011-04-25 DIAGNOSIS — H35379 Puckering of macula, unspecified eye: Secondary | ICD-10-CM | POA: Diagnosis not present

## 2011-05-03 ENCOUNTER — Other Ambulatory Visit: Payer: Self-pay | Admitting: *Deleted

## 2011-05-03 MED ORDER — AMLODIPINE BESYLATE 5 MG PO TABS: 5.0000 mg | ORAL_TABLET | Freq: Every day | ORAL | Status: DC

## 2011-05-03 MED ORDER — FUROSEMIDE 20 MG PO TABS: 20.0000 mg | ORAL_TABLET | Freq: Every day | ORAL | Status: DC

## 2011-05-09 ENCOUNTER — Other Ambulatory Visit: Payer: Self-pay | Admitting: *Deleted

## 2011-05-09 MED ORDER — PRAVASTATIN SODIUM 40 MG PO TABS: 40.0000 mg | ORAL_TABLET | Freq: Every day | ORAL | Status: DC

## 2011-05-16 ENCOUNTER — Other Ambulatory Visit: Payer: Self-pay

## 2011-05-16 MED ORDER — EZETIMIBE 10 MG PO TABS: 10.0000 mg | ORAL_TABLET | Freq: Every day | ORAL | Status: DC

## 2011-05-16 NOTE — Telephone Encounter (Signed)
Refill sent for zetia.  

## 2011-05-24 ENCOUNTER — Encounter: Payer: Self-pay | Admitting: *Deleted

## 2011-06-19 DIAGNOSIS — K760 Fatty (change of) liver, not elsewhere classified: Secondary | ICD-10-CM

## 2011-06-19 DIAGNOSIS — K5790 Diverticulosis of intestine, part unspecified, without perforation or abscess without bleeding: Secondary | ICD-10-CM

## 2011-06-19 HISTORY — DX: Diverticulosis of intestine, part unspecified, without perforation or abscess without bleeding: K57.90

## 2011-06-19 HISTORY — DX: Fatty (change of) liver, not elsewhere classified: K76.0

## 2011-06-20 ENCOUNTER — Telehealth: Payer: Self-pay | Admitting: Family Medicine

## 2011-06-20 MED ORDER — SILDENAFIL CITRATE 100 MG PO TABS: 100.0000 mg | ORAL_TABLET | Freq: Every day | ORAL | Status: DC | PRN

## 2011-06-20 NOTE — Telephone Encounter (Signed)
Message left notifying patient.

## 2011-06-20 NOTE — Telephone Encounter (Signed)
plz notify med sent in.   Has used before.

## 2011-06-20 NOTE — Telephone Encounter (Signed)
Aaron Stafford would like for you to call in an rx for Viagra 100mg  to [facility] in [place].

## 2011-07-16 ENCOUNTER — Encounter: Payer: Self-pay | Admitting: Family Medicine

## 2011-07-16 ENCOUNTER — Ambulatory Visit (INDEPENDENT_AMBULATORY_CARE_PROVIDER_SITE_OTHER): Payer: Medicare Other | Admitting: Family Medicine

## 2011-07-16 VITALS — BP 140/84 | HR 72 | Temp 98.2°F | Wt 202.8 lb

## 2011-07-16 DIAGNOSIS — Z87898 Personal history of other specified conditions: Secondary | ICD-10-CM

## 2011-07-16 DIAGNOSIS — Z87438 Personal history of other diseases of male genital organs: Secondary | ICD-10-CM

## 2011-07-16 DIAGNOSIS — N138 Other obstructive and reflux uropathy: Secondary | ICD-10-CM | POA: Insufficient documentation

## 2011-07-16 DIAGNOSIS — R109 Unspecified abdominal pain: Secondary | ICD-10-CM

## 2011-07-16 LAB — COMPREHENSIVE METABOLIC PANEL
BUN: 23 mg/dL (ref 6–23)
CO2: 32 mEq/L (ref 19–32)
GFR: 90.82 mL/min (ref >60.00)
Glucose, Bld: 137 mg/dL — ABNORMAL HIGH (ref 70–99)
Sodium: 138 mEq/L (ref 135–145)
Total Bilirubin: 1 mg/dL (ref 0.3–1.2)
Total Protein: 7.2 g/dL (ref 6.0–8.3)

## 2011-07-16 LAB — POCT URINALYSIS DIPSTICK
Bilirubin, UA: NEGATIVE
Ketones, UA: NEGATIVE
Leukocytes, UA: NEGATIVE

## 2011-07-16 LAB — CBC WITH DIFFERENTIAL/PLATELET
Basophils Relative: 0.3 % (ref 0.0–3.0)
Eosinophils Relative: 1.8 % (ref 0.0–5.0)
HCT: 46.3 % (ref 39.0–52.0)
Lymphs Abs: 1.7 10*3/uL (ref 0.7–4.0)
Monocytes Relative: 9 % (ref 3.0–12.0)
Neutrophils Relative %: 65.3 % (ref 43.0–77.0)
Platelets: 211 10*3/uL (ref 150.0–400.0)
RBC: 5.04 Mil/uL (ref 4.22–5.81)
WBC: 7.3 10*3/uL (ref 4.5–10.5)

## 2011-07-16 MED ORDER — HYDROCHLOROTHIAZIDE 25 MG PO TABS: 25.0000 mg | ORAL_TABLET | Freq: Every day | ORAL | Status: DC

## 2011-07-16 MED ORDER — METFORMIN HCL 500 MG PO TABS: 500.0000 mg | ORAL_TABLET | Freq: Every day | ORAL | Status: DC

## 2011-07-16 NOTE — Assessment & Plan Note (Signed)
Again do not appreciate evident hernia, however longstanding sxs in h/o L inguinal hernia.  Will obtain CBC, CMP and CT abd/pelvis to fully evaluate.  Likely will refer to surgery depending on results of CT scan.   Wt Readings from Last 3 Encounters:  07/16/11 202 lb 12 oz (91.967 kg)  03/25/11 205 lb 4 oz (93.101 kg)  03/20/11 204 lb 4 oz (92.647 kg)  UA - trace blood but negative RBCs/hpf on micro.

## 2011-07-16 NOTE — Patient Instructions (Addendum)
I would like to start with CT scan of abdomen then will likely refer you to surgeon (Dr. Katrinka Blazing). Good to see you today, call us with questions.

## 2011-07-16 NOTE — Progress Notes (Signed)
  Subjective:    Patient ID: Aaron Stafford, male    DOB: [birth year], 77 y.o.   MRN: [id number]  HPI CC: ?hernia  Told has L inguinal hernia in past, has been bothering him more for last 6 mo.  Actually sxs for >1 yr.  Progressively worsening.  Constant "sore intestines".  Has not used belt for last several months 2/2 worsens pain.  Waking him up in am, trouble sleeping.  Points to lower left abdomen at groin.    H/o hernia surgery 10 yrs ago.  Told had 3 hernias, 2 were repaired (umbilical and right inguinal).  Never repaired left side.  Thinks this is acting up.  Also with LBP.  Improved with muscle relaxant and tylenol.  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  Also with h/o diverticulosis never itis.    No fevers/chills, blood in stool or urine, dysuria or frequency.  Has 1 BM every 2 days, some constipation, no change. H/o hemorrhoids.  Nocturia x 1.  + urgency.  Prostate exam last visit 03/2011 unremarkable.  On finasteride per prior PCP for h/o mild BPH  Lab Results  Component Value Date   PSA 0.05 03/20/2011   PSA 0.09 04/06/2010   PSA 0.41 04/07/2009   Past Medical History  Diagnosis Date  . CAD (coronary artery disease)     with stents  . HTN (hypertension)   . HLD (hyperlipidemia)   . Diabetes mellitus type II   . GERD (gastroesophageal reflux disease)   . Chronic rhinitis     ENT [place]  . Ischemic heart disease   . MI (myocardial infarction) 1995  . History of BPH    Past Surgical History  Procedure Date  . Cholecystectomy 1985  . Coronary artery bypass graft 05/1993    x6 MI Attemped angioplasty  . Inguinal hernia repair 11/2000    and ventral hernia repair  . Cystoscopy 10/03/08    Normal-Dr. Lonna Cobb  . Ankle surgery 03/2002    Left ankle fracture  . Carotid stent 07/2009    RCA stent    Review of Systems Per HPI    Objective:   Physical Exam  Nursing note and vitals reviewed. Constitutional: He appears well-developed and  well-nourished. No distress.  Cardiovascular: Normal rate, regular rhythm, normal heart sounds and intact distal pulses.   No murmur heard. Pulmonary/Chest: Effort normal and breath sounds normal. No respiratory distress. He has no wheezes. He has no rales.  Abdominal: Soft. Bowel sounds are normal. He exhibits no distension. There is no hepatosplenomegaly. There is no tenderness. There is no rebound, no guarding and no CVA tenderness. A hernia is present. Hernia confirmed positive in the ventral area.       No significant inguinal hernia appreciated on exam.  Genitourinary: Penis normal. Right testis shows swelling. Right testis shows no mass and no tenderness. Right testis is descended. Left testis shows swelling. Left testis shows no mass and no tenderness. Left testis is descended. Uncircumcised.       Right sided hydrocele, nontender Some scrotal swelling bilaterally, nontender No evident hernias  Musculoskeletal: He exhibits no edema.  Skin: Skin is warm and dry. No rash noted.  Psychiatric: He has a normal mood and affect.       Assessment & Plan:

## 2011-07-19 ENCOUNTER — Ambulatory Visit (INDEPENDENT_AMBULATORY_CARE_PROVIDER_SITE_OTHER)
Admission: RE | Admit: 2011-07-19 | Discharge: 2011-07-19 | Disposition: A | Discharge status: Home / Self Care | Payer: Medicare Other | Source: Ambulatory Visit | Attending: Family Medicine | Admitting: Family Medicine

## 2011-07-19 ENCOUNTER — Other Ambulatory Visit: Payer: Self-pay | Admitting: Family Medicine

## 2011-07-19 DIAGNOSIS — R109 Unspecified abdominal pain: Secondary | ICD-10-CM | POA: Diagnosis not present

## 2011-07-19 DIAGNOSIS — K7689 Other specified diseases of liver: Secondary | ICD-10-CM | POA: Diagnosis not present

## 2011-07-19 DIAGNOSIS — K5730 Diverticulosis of large intestine without perforation or abscess without bleeding: Secondary | ICD-10-CM | POA: Diagnosis not present

## 2011-07-19 IMAGING — CT CT ABD-PELV W/ CM
2 of 5 series · 17 of 46 positions shown, 19 images · IV contrast (Omnipaque 300)
Comparison: [DATE]

CLINICAL DATA: Left lower abdominal and inguinal pain.  Previous
hernia repairs.  Evaluate for recurrent hernia.

CT ABDOMEN AND PELVIS WITH CONTRAST
TECHNIQUE: Multidetector CT imaging of the abdomen and pelvis was
performed following the standard protocol during bolus
administration of intravenous contrast.
Contrast: 100mL OMNIPAQUE IOHEXOL 300 MG/ML  SOLN

[Series 2: abd/ pel 5mm · axial · 0.79mm/px · z∈[-382,+4]mm · 14 of 87 slices shown, 16 images]
[im 5/87  soft-tissue]
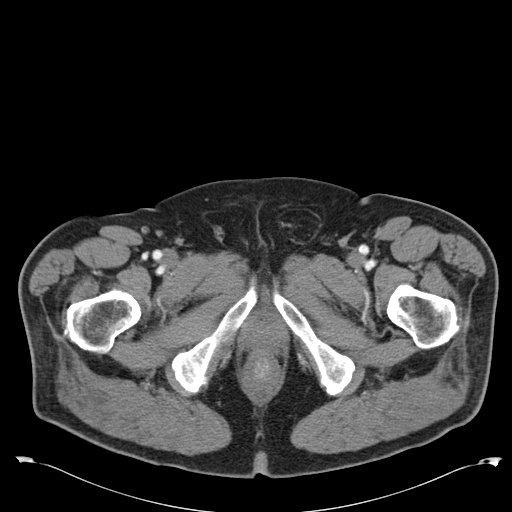
[im 5/87  bone]
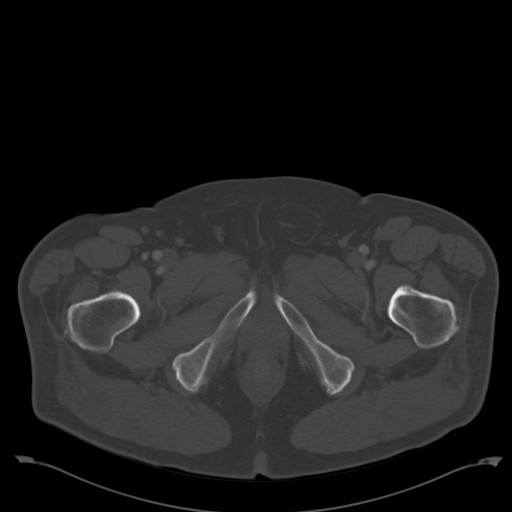
[im 13/87  soft-tissue]
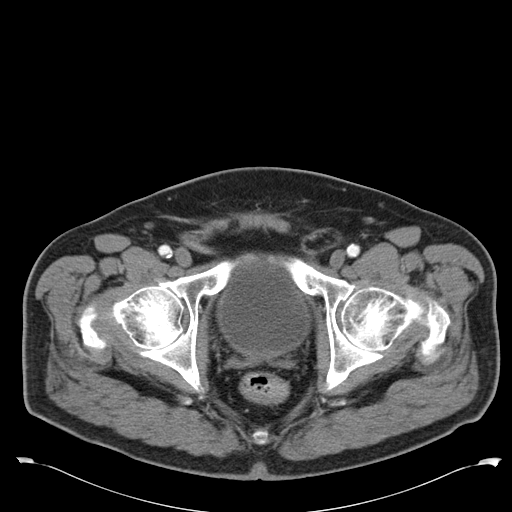
[im 18/87  soft-tissue]
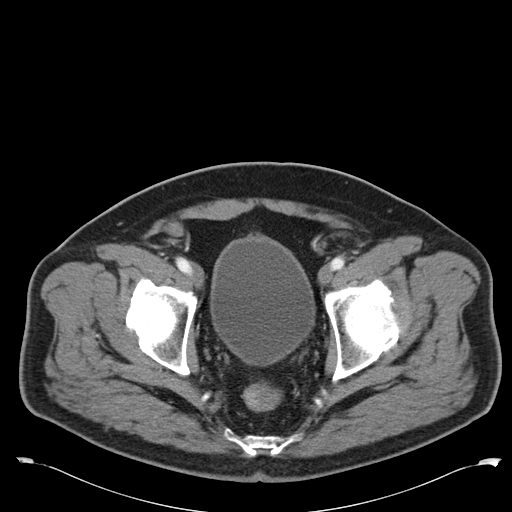
[im 22/87  soft-tissue]
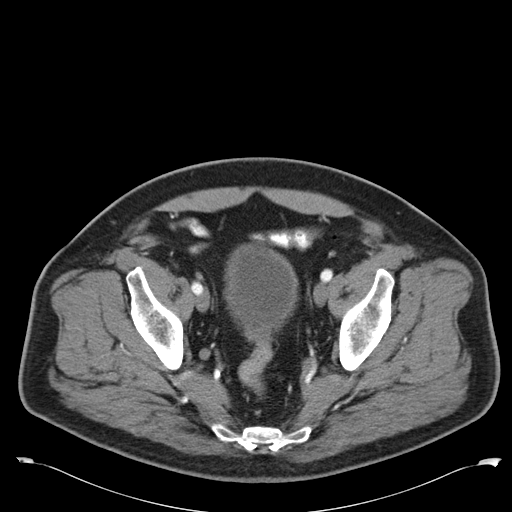
[im 31/87  soft-tissue]
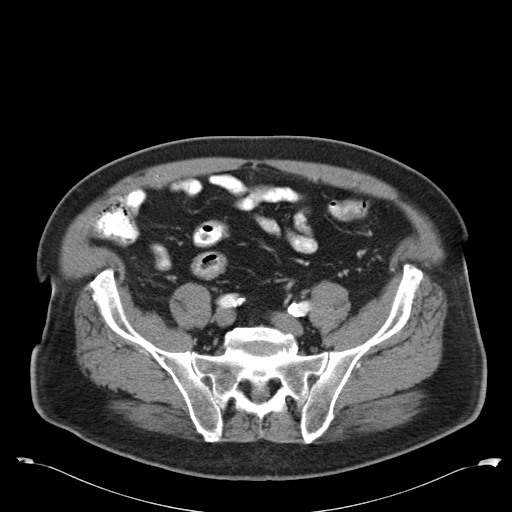
[im 35/87  soft-tissue]
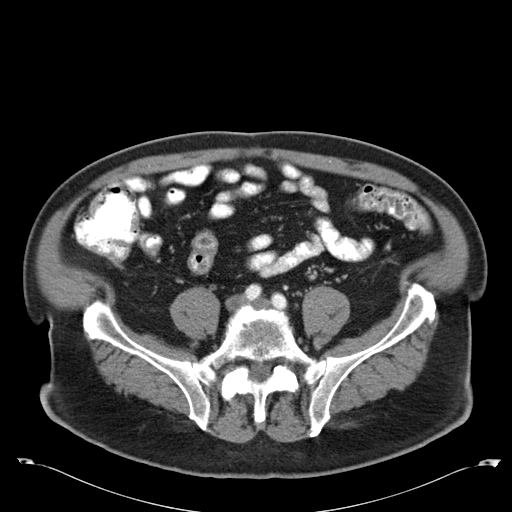
[im 39/87  soft-tissue]
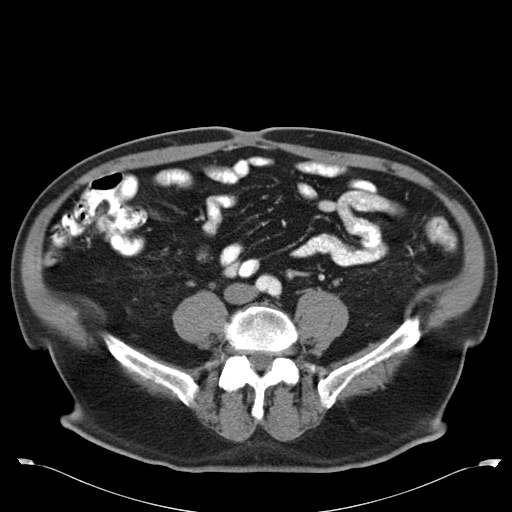
[im 48/87  soft-tissue]
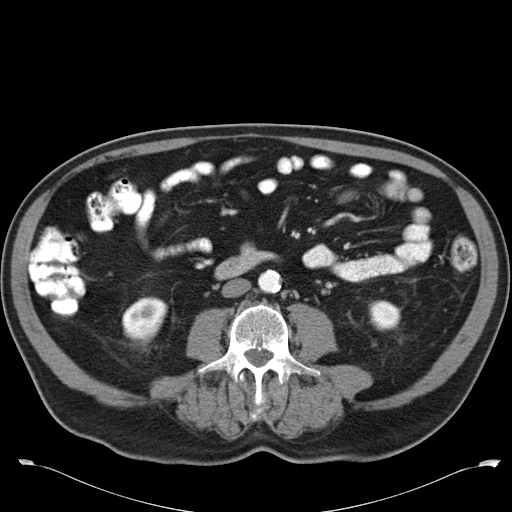
[im 52/87  soft-tissue]
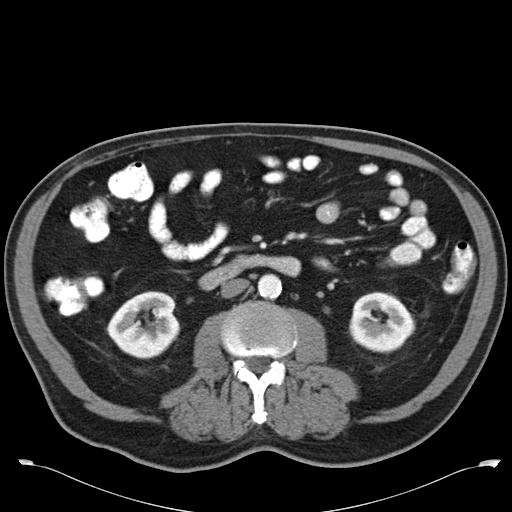
[im 52/87  bone]
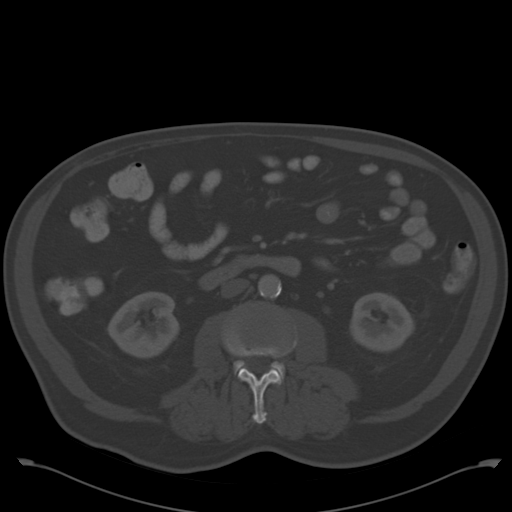
[im 56/87  soft-tissue]
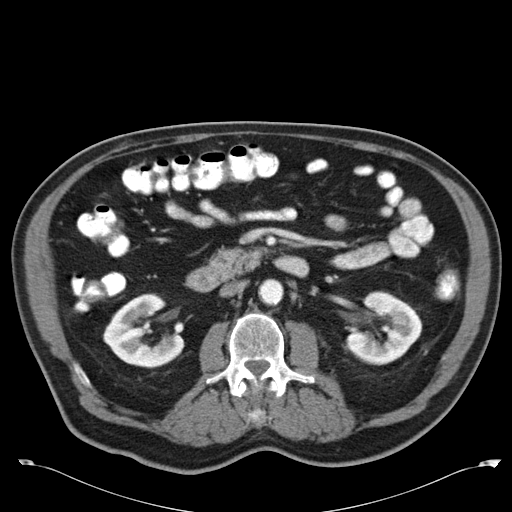
[im 65/87  soft-tissue]
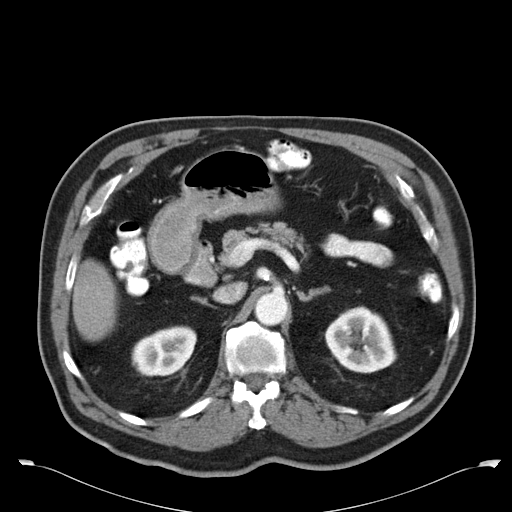
[im 69/87  soft-tissue]
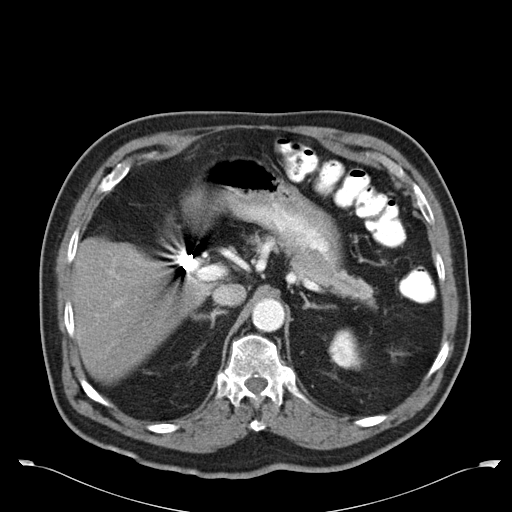
[im 74/87  soft-tissue]
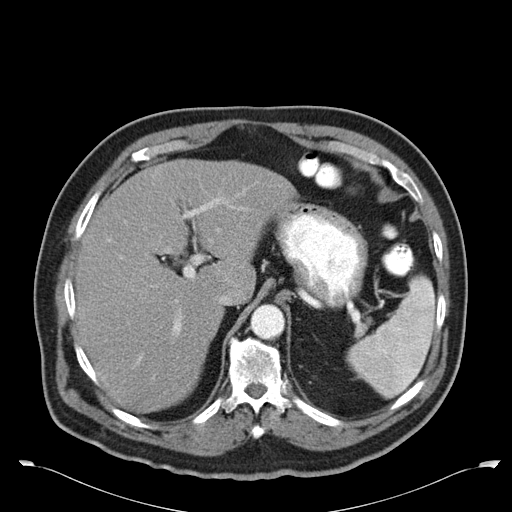
[im 82/87  soft-tissue]
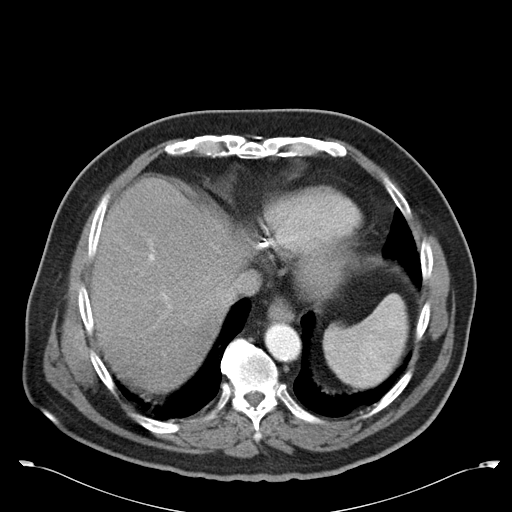

[Series 602: cor · coronal · 0.87mm/px · 3 of 114 slices shown]
[im 38/114  soft-tissue]
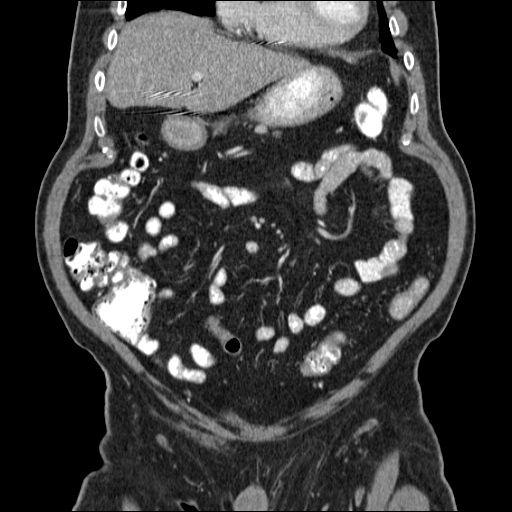
[im 51/114  soft-tissue]
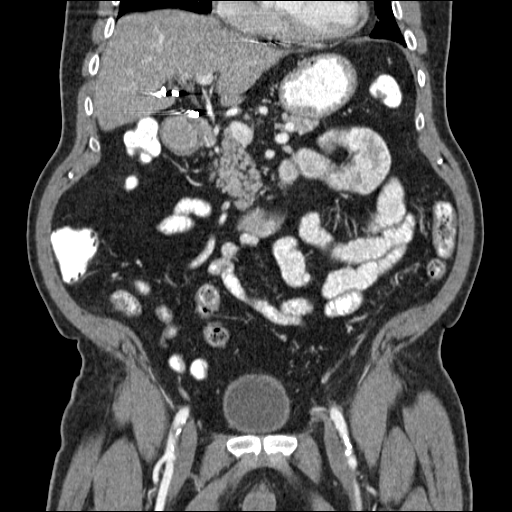
[im 63/114  soft-tissue]
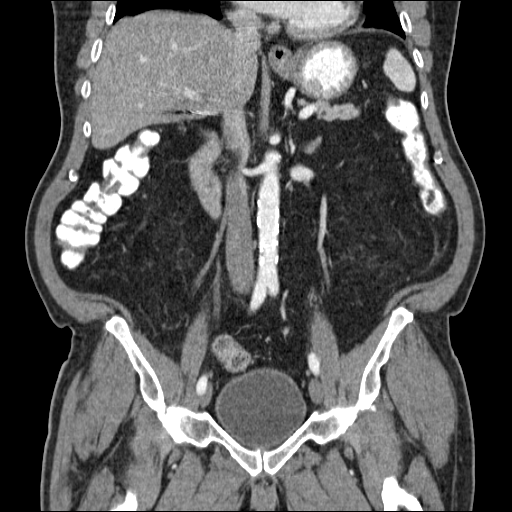

[17 of 46 positions shown; findings below may reference images not displayed]

FINDINGS: Surgical clips again seen from prior cholecystectomy.
Diffuse hepatic steatosis noted, but no liver masses are
identified.  No evidence of biliary ductal dilatation.  The
pancreas, spleen, adrenal glands, and kidneys are normal in
appearance.

No soft tissue masses or lymphadenopathy identified within the
abdomen or pelvis. A small amount of fat is again seen within the
left inguinal canal which is unchanged since previous study.  This
could represent a lipomatous spermatic cord or small inguinal
hernia containing only fat.  There is no evidence of herniated
bowel loops.  No abdominal wall masses or fluid collections are
identified.

No inflammatory process or abnormal fluid collections identified
within the abdomen or pelvis.  Mild diverticulosis is seen involve
the sigmoid colon, however there is no evidence of diverticulitis.
No suspicious bone lesions identified.
IMPRESSION: 1.  Stable appearance of fat within the left inguinal canal.  This
may be represent a lipomatous spermatic cord versus small inguinal
hernia containing only fat.
2.  Diverticulosis.  No radiographic evidence of diverticulitis.
3.  Hepatic steatosis.

## 2011-07-19 MED ORDER — IOHEXOL 300 MG/ML  SOLN
100.0000 mL | Freq: Once | INTRAMUSCULAR | Status: AC | PRN
  Administered 2011-07-19: 100 mL via INTRAVENOUS

## 2011-07-20 ENCOUNTER — Ambulatory Visit (INDEPENDENT_AMBULATORY_CARE_PROVIDER_SITE_OTHER): Payer: Medicare Other | Admitting: Family Medicine

## 2011-07-20 ENCOUNTER — Encounter: Payer: Self-pay | Admitting: Family Medicine

## 2011-07-20 VITALS — BP 180/96 | HR 60 | Temp 97.6°F | Wt 203.0 lb

## 2011-07-20 DIAGNOSIS — L509 Urticaria, unspecified: Secondary | ICD-10-CM | POA: Diagnosis not present

## 2011-07-20 MED ORDER — PREDNISONE 10 MG PO TABS: ORAL_TABLET | ORAL | Status: DC

## 2011-07-20 MED ORDER — PREDNISONE 20 MG PO TABS: ORAL_TABLET | ORAL | Status: DC

## 2011-07-20 NOTE — Patient Instructions (Addendum)
Start prednisone taper. Follow blood sugar closely.  Call if blood sugars >350. Restart metformin on Monday.

## 2011-07-20 NOTE — Progress Notes (Signed)
  Subjective:    Patient ID: Aaron Stafford, male    DOB: 12-31-1934, 77 y.o.   MRN: [id number]  HPI  77 year old male presents with new onset hives few hours  following CT scan yesterday with contrast. He also reports similar episode following stent placement years ago, in retrospect it may have been following contrast at that time.  He reports itchy rash all over. No difficulty swallowing, no tounge/lip swelling or breathing.  Has taken benadryl last night and today.  He has DM is well controlled. Has monitor. He is off metfromin till Monday given contrast.     Review of Systems  Constitutional: Negative for fever and fatigue.  HENT: Negative for ear pain.   Eyes: Negative for pain.  Respiratory: Negative for shortness of breath.   Cardiovascular: Negative for chest pain.       Objective:   Physical Exam  Constitutional: Vital signs are normal. He appears well-developed and well-nourished.  HENT:  Head: Normocephalic.  Right Ear: Hearing normal.  Left Ear: Hearing normal.  Nose: Nose normal.  Mouth/Throat: Oropharynx is clear and moist and mucous membranes are normal.  Neck: Trachea normal. Carotid bruit is not present. No mass and no thyromegaly present.  Cardiovascular: Normal rate, regular rhythm and normal pulses.  Exam reveals no gallop, no distant heart sounds and no friction rub.   No murmur heard.      No peripheral edema  Pulmonary/Chest: Effort normal and breath sounds normal. No respiratory distress.  Skin: Skin is warm, dry and intact. Rash noted. Rash is urticarial.       On arms , torso, legs  Psychiatric: He has a normal mood and affect. His speech is normal and behavior is normal. Thought content normal.          Assessment & Plan:

## 2011-07-23 ENCOUNTER — Telehealth: Payer: Self-pay

## 2011-07-23 DIAGNOSIS — L509 Urticaria, unspecified: Secondary | ICD-10-CM | POA: Insufficient documentation

## 2011-07-23 MED ORDER — ZOLPIDEM TARTRATE 10 MG PO TABS: 10.0000 mg | ORAL_TABLET | Freq: Every evening | ORAL | Status: DC | PRN

## 2011-07-23 NOTE — Assessment & Plan Note (Signed)
Treat with benadryl and prednisone taper. No sign of anaphylaxis. Pt instructed to go to ER if SOB, tongue swelling etc.

## 2011-07-23 NOTE — Telephone Encounter (Signed)
Rx called in as directed. Message left advising patient to only take 1 pill at night. Advised to continue benadryl and add zantac if needed. Instructed to call back if no improvement or if ambien no help with sleep.

## 2011-07-23 NOTE — Telephone Encounter (Signed)
plz phone in [place] and notify pt ambien phoned in.  I don't want him to take more than 1 pill of 10mg  a night.  If not helping to let me know. Also recommend he continue benadryl for itch.  May also add on zantac OTC for itch. Update [place] if not improving.

## 2011-07-23 NOTE — Telephone Encounter (Signed)
After CT scan had severe rash to contrast medium. Added contrast med to allergy list. [facility]; on prednisone. Rash is still there (better but still itching a lot) and not able to sleep due to prednisone. Trazodone did not help. Pt took Ambien 10 mg two tabs last night; was able to sleep. (not on med list but pt had previously).[facility].

## 2011-08-19 DIAGNOSIS — K4090 Unilateral inguinal hernia, without obstruction or gangrene, not specified as recurrent: Secondary | ICD-10-CM | POA: Diagnosis not present

## 2011-08-29 DIAGNOSIS — L570 Actinic keratosis: Secondary | ICD-10-CM | POA: Diagnosis not present

## 2011-08-29 DIAGNOSIS — Z85820 Personal history of malignant melanoma of skin: Secondary | ICD-10-CM | POA: Diagnosis not present

## 2011-08-29 DIAGNOSIS — L919 Hypertrophic disorder of the skin, unspecified: Secondary | ICD-10-CM | POA: Diagnosis not present

## 2011-08-29 DIAGNOSIS — L909 Atrophic disorder of skin, unspecified: Secondary | ICD-10-CM | POA: Diagnosis not present

## 2011-08-29 DIAGNOSIS — L820 Inflamed seborrheic keratosis: Secondary | ICD-10-CM | POA: Diagnosis not present

## 2011-09-23 ENCOUNTER — Ambulatory Visit (INDEPENDENT_AMBULATORY_CARE_PROVIDER_SITE_OTHER): Payer: Medicare Other | Admitting: Family Medicine

## 2011-09-23 ENCOUNTER — Encounter: Payer: Self-pay | Admitting: Family Medicine

## 2011-09-23 VITALS — BP 146/80 | HR 64 | Temp 97.9°F | Wt 199.8 lb

## 2011-09-23 DIAGNOSIS — I2510 Atherosclerotic heart disease of native coronary artery without angina pectoris: Secondary | ICD-10-CM

## 2011-09-23 DIAGNOSIS — E119 Type 2 diabetes mellitus without complications: Secondary | ICD-10-CM | POA: Diagnosis not present

## 2011-09-23 DIAGNOSIS — I10 Essential (primary) hypertension: Secondary | ICD-10-CM

## 2011-09-23 DIAGNOSIS — E785 Hyperlipidemia, unspecified: Secondary | ICD-10-CM | POA: Diagnosis not present

## 2011-09-23 DIAGNOSIS — E669 Obesity, unspecified: Secondary | ICD-10-CM

## 2011-09-23 MED ORDER — CYCLOBENZAPRINE HCL 5 MG PO TABS: ORAL_TABLET | ORAL | Status: DC

## 2011-09-23 NOTE — Assessment & Plan Note (Signed)
Recommended start daily walking.

## 2011-09-23 NOTE — Assessment & Plan Note (Signed)
Chronic, controlled and stable on metformin. Check A1c today. Encouraged weight loss through regular exercise.

## 2011-09-23 NOTE — Patient Instructions (Signed)
Flexeril refilled. We will check A1c today. Return in 6 months for wellness exam. Good to see you today, call us iwht quesitons.

## 2011-09-23 NOTE — Assessment & Plan Note (Signed)
Chronic.  Currently asxs

## 2011-09-23 NOTE — Assessment & Plan Note (Signed)
Chronic, stable. Continue meds. 

## 2011-09-23 NOTE — Progress Notes (Signed)
  Subjective:    Patient ID: Aaron Stafford, male    DOB: [birth year], 77 y.o.   MRN: [id number]  HPI CC: 6 mo f/u  Seen here 03/2011 for medicare wellness visit  Seen here 06/2011 with abd discomfort, referred to surgery with suspicion of L inguinal hernia.  Referred to surgery at [facility].  Decided agaist surgery for now.  Recently worsening muscle spasms in lower back and sides.  This has been going on for about 2 weeks.  Sitting in recliner exacerbates symptoms.  Denies inciting trauma, change in exertion.    DM - checks sugars randomly throughout the day.  One high of 152.  Checks usually at night.  Tolerating metformin well.  Vision exam 03/2011.  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  Mild paresthesias in toes bilaterally. Lab Results  Component Value Date   HGBA1C 6.3 03/20/2011   HTN - bp slightly elevated today.  No HA, vision changes, CP/tightness, SOB, leg swelling.    HLD - tolerating pravastatin.  Last check LDL <70.  Past Medical History  Diagnosis Date  . CAD (coronary artery disease)     with stents  . HTN (hypertension)   . HLD (hyperlipidemia)   . Diabetes mellitus type II   . GERD (gastroesophageal reflux disease)   . Chronic rhinitis     ENT [place]  . Ischemic heart disease   . MI (myocardial infarction) 1995  . History of BPH   . Fatty liver 06/2011    by CT  . Diverticulosis 06/2011    by CT     Review of Systems Per HPI    Objective:   Physical Exam  Nursing note and vitals reviewed. Constitutional: He appears well-developed and well-nourished. No distress.  HENT:  Head: Normocephalic and atraumatic.  Right Ear: External ear normal.  Left Ear: External ear normal.  Nose: Nose normal.  Mouth/Throat: Oropharynx is clear and moist. No oropharyngeal exudate.  Eyes: Conjunctivae and EOM are normal. Pupils are equal, round, and reactive to light. No scleral icterus.  Neck: Normal range of motion. Neck supple. Carotid bruit is not present.  Cardiovascular: Normal  rate, regular rhythm, normal heart sounds and intact distal pulses.   No murmur heard. Pulmonary/Chest: Effort normal and breath sounds normal. No respiratory distress. He has no wheezes. He has no rales.  Musculoskeletal: He exhibits no edema.       Diabetic foot exam: Normal inspection No skin breakdown No calluses  Normal DP/PT pulses Normal sensation to light tough and monofilament Nails normal  Lymphadenopathy:    He has no cervical adenopathy.  Skin: Skin is warm and dry. No rash noted.  Psychiatric: He has a normal mood and affect.       Assessment & Plan:

## 2011-09-23 NOTE — Assessment & Plan Note (Signed)
Chronic, stable. Check FLP next visit in 6 months. Continue pravastatin.

## 2011-09-24 ENCOUNTER — Other Ambulatory Visit: Payer: Self-pay | Admitting: *Deleted

## 2011-09-24 MED ORDER — FINASTERIDE 5 MG PO TABS: 5.0000 mg | ORAL_TABLET | Freq: Every day | ORAL | Status: DC

## 2011-10-23 ENCOUNTER — Other Ambulatory Visit: Payer: Self-pay | Admitting: *Deleted

## 2011-10-23 MED ORDER — FINASTERIDE 5 MG PO TABS: 5.0000 mg | ORAL_TABLET | Freq: Every day | ORAL | Status: DC

## 2011-11-11 ENCOUNTER — Other Ambulatory Visit: Payer: Self-pay | Admitting: *Deleted

## 2011-11-11 MED ORDER — PRAVASTATIN SODIUM 40 MG PO TABS: 40.0000 mg | ORAL_TABLET | Freq: Every day | ORAL | Status: DC

## 2011-11-11 NOTE — Telephone Encounter (Signed)
Refilled Pravastatin and scheduled overdue 6 month f/u appointment.

## 2011-11-20 ENCOUNTER — Ambulatory Visit (INDEPENDENT_AMBULATORY_CARE_PROVIDER_SITE_OTHER): Payer: Medicare Other | Admitting: Cardiovascular Disease

## 2011-11-20 ENCOUNTER — Encounter: Payer: Self-pay | Admitting: Cardiovascular Disease

## 2011-11-20 VITALS — BP 142/72 | HR 64 | Ht 69.5 in | Wt 202.2 lb

## 2011-11-20 DIAGNOSIS — E785 Hyperlipidemia, unspecified: Secondary | ICD-10-CM

## 2011-11-20 DIAGNOSIS — I2510 Atherosclerotic heart disease of native coronary artery without angina pectoris: Secondary | ICD-10-CM | POA: Diagnosis not present

## 2011-11-20 DIAGNOSIS — I10 Essential (primary) hypertension: Secondary | ICD-10-CM

## 2011-11-20 NOTE — Assessment & Plan Note (Signed)
Cholesterol is well controlled. His zetia is very expensive. We have suggested he try a half pill with recheck of his cholesterol and follow up with Dr. Sharen Hones.

## 2011-11-20 NOTE — Assessment & Plan Note (Signed)
Currently with no symptoms of angina. No further workup at this time. Continue current medication regimen. 

## 2011-11-20 NOTE — Patient Instructions (Addendum)
You are doing well. No medication changes were made.  Please call us if you have new issues that need to be addressed before your next appt.  Your physician wants you to follow-up in: 12 months.  You will receive a reminder letter in the mail two months in advance. If you don't receive a letter, please call our office to schedule the follow-up appointment. 

## 2011-11-20 NOTE — Assessment & Plan Note (Signed)
Blood pressure is well controlled on today's visit. No changes made to the medications. 

## 2011-11-20 NOTE — Progress Notes (Signed)
Patient ID: Aaron Stafford, male    DOB: July 24, 1934, 77 y.o.   MRN: [id number]  HPI Comments: Mr. Mika is a very pleasant 77 year old gentleman with a history of coronary artery disease,  bypass surgery, With LIMA graft to the LAD, vein graft to the RCA, vein graft to the OM with previous stent placed to the vein graft to the RCA in June 2011, hyperlipidemia, hypertension who presents for routine follow up. 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   He reports that since the stent was placed last year, he has been doing well. He denies any significant shortness of breath or chest pain. He does not exercise on a regular basis.  He is on metoprolol tartrate to 12.5 mg b.i.d. and reports his fatigue is better .  He continues to be very active, working in [employer] and otherwise feels well.    EKG shows normal sinus rhythm with rate 64 beats per minute with interventricular conduction delay, left anterior fascicular block   Outpatient Encounter Prescriptions as of 11/20/2011  Medication Sig Dispense Refill  . amLODipine (NORVASC) 5 MG tablet Take 1 tablet (5 mg total) by mouth daily.  30 tablet  5  . aspirin 81 MG tablet Take 81 mg by mouth daily.        Marland Kitchen ezetimibe (ZETIA) 10 MG tablet Take 1 tablet (10 mg total) by mouth daily.  30 tablet  5  . finasteride (PROSCAR) 5 MG tablet Take 1 tablet (5 mg total) by mouth daily.  30 tablet  6  . furosemide (LASIX) 20 MG tablet Take 1 tablet (20 mg total) by mouth daily.  30 tablet  5  . hydrochlorothiazide (HYDRODIURIL) 25 MG tablet Take 1 tablet (25 mg total) by mouth daily.  90 tablet  3  . metFORMIN (GLUCOPHAGE) 500 MG tablet Take 1 tablet (500 mg total) by mouth at bedtime.  90 tablet  3  . metoprolol tartrate (LOPRESSOR) 25 MG tablet Take 0.5 tablets (12.5 mg total) by mouth 2 (two) times daily.  90 tablet  3  . pravastatin (PRAVACHOL) 40 MG tablet Take 1 tablet (40 mg total) by mouth at bedtime.  30 tablet  4  . sildenafil (VIAGRA) 100 MG tablet Take 1  tablet (100 mg total) by mouth daily as needed.  10 tablet  5  . traZODone (DESYREL) 50 MG tablet Take 1 tablet (50 mg total) by mouth at bedtime.  90 tablet  1   Review of Systems  HENT: Negative.   Eyes: Negative.   Respiratory: Negative.   Cardiovascular: Negative.   Gastrointestinal: Negative.   Musculoskeletal: Negative.   Skin: Negative.   Neurological: Negative.   Hematological: Negative.   Psychiatric/Behavioral: Negative.   All other systems reviewed and are negative.    BP 130/80  Pulse 51  Ht 5\' 8"  (1.727 m)  Wt 204 lb (92.534 kg)  BMI 31.02 kg/m2  Physical Exam  Nursing note and vitals reviewed. Constitutional: He is oriented to person, place, and time. He appears well-developed and well-nourished.  HENT:  Head: Normocephalic.  Nose: Nose normal.  Mouth/Throat: Oropharynx is clear and moist.  Eyes: Conjunctivae normal are normal. Pupils are equal, round, and reactive to light.  Neck: Normal range of motion. Neck supple. No JVD present.  Cardiovascular: Normal rate, regular rhythm, S1 normal, S2 normal, normal heart sounds and intact distal pulses.  Exam reveals no gallop and no friction rub.   No murmur heard. Pulmonary/Chest: Effort  normal and breath sounds normal. No respiratory distress. He has no wheezes. He has no rales. He exhibits no tenderness.  Abdominal: Soft. Bowel sounds are normal. He exhibits no distension. There is no tenderness.  Musculoskeletal: Normal range of motion. He exhibits no edema and no tenderness.  Lymphadenopathy:    He has no cervical adenopathy.  Neurological: He is alert and oriented to person, place, and time. Coordination normal.  Skin: Skin is warm and dry. No rash noted. No erythema.  Psychiatric: He has a normal mood and affect. His behavior is normal. Judgment and thought content normal.           Assessment and Plan

## 2011-11-21 ENCOUNTER — Ambulatory Visit (INDEPENDENT_AMBULATORY_CARE_PROVIDER_SITE_OTHER): Payer: Medicare Other

## 2011-11-21 DIAGNOSIS — Z23 Encounter for immunization: Secondary | ICD-10-CM

## 2011-12-02 ENCOUNTER — Other Ambulatory Visit: Payer: Self-pay | Admitting: *Deleted

## 2011-12-02 MED ORDER — AMLODIPINE BESYLATE 5 MG PO TABS: 5.0000 mg | ORAL_TABLET | Freq: Every day | ORAL | Status: DC

## 2011-12-02 MED ORDER — FUROSEMIDE 20 MG PO TABS: 20.0000 mg | ORAL_TABLET | Freq: Every day | ORAL | Status: DC

## 2011-12-02 NOTE — Telephone Encounter (Signed)
Refilled Furosemide and amlodipine.

## 2012-01-13 ENCOUNTER — Other Ambulatory Visit: Payer: Self-pay

## 2012-01-13 MED ORDER — EZETIMIBE 10 MG PO TABS: 10.0000 mg | ORAL_TABLET | Freq: Every day | ORAL | Status: DC

## 2012-01-13 NOTE — Telephone Encounter (Signed)
Refill sent for zetia.  

## 2012-01-21 ENCOUNTER — Ambulatory Visit (INDEPENDENT_AMBULATORY_CARE_PROVIDER_SITE_OTHER): Payer: Medicare Other | Admitting: Family Medicine

## 2012-01-21 VITALS — BP 132/70 | HR 61 | Temp 98.3°F | Ht 67.0 in | Wt 204.0 lb

## 2012-01-21 DIAGNOSIS — Z0289 Encounter for other administrative examinations: Secondary | ICD-10-CM

## 2012-01-21 DIAGNOSIS — Z024 Encounter for examination for driving license: Secondary | ICD-10-CM

## 2012-01-21 LAB — POCT URINALYSIS DIPSTICK
Bilirubin, UA: NEGATIVE
Blood, UA: NEGATIVE
Glucose, UA: NEGATIVE
Spec Grav, UA: <=1.005

## 2012-01-21 NOTE — Progress Notes (Signed)
  Subjective:    Patient ID: Aaron Stafford, male    DOB: 05-26-1934, 77 y.o.   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 I gave the form to Dr. Reece Agar to be filled out and Carlena Sax will give form to patient, per pt ok.      Assessment & Plan:

## 2012-01-22 NOTE — Progress Notes (Signed)
Filled form and handed to [place].

## 2012-01-28 DIAGNOSIS — L820 Inflamed seborrheic keratosis: Secondary | ICD-10-CM | POA: Diagnosis not present

## 2012-01-28 DIAGNOSIS — Z85828 Personal history of other malignant neoplasm of skin: Secondary | ICD-10-CM | POA: Diagnosis not present

## 2012-01-28 DIAGNOSIS — L578 Other skin changes due to chronic exposure to nonionizing radiation: Secondary | ICD-10-CM | POA: Diagnosis not present

## 2012-01-28 DIAGNOSIS — L570 Actinic keratosis: Secondary | ICD-10-CM | POA: Diagnosis not present

## 2012-01-28 DIAGNOSIS — Z85820 Personal history of malignant melanoma of skin: Secondary | ICD-10-CM | POA: Diagnosis not present

## 2012-02-07 DIAGNOSIS — Z23 Encounter for immunization: Secondary | ICD-10-CM | POA: Diagnosis not present

## 2012-02-20 ENCOUNTER — Other Ambulatory Visit: Payer: Self-pay | Admitting: *Deleted

## 2012-02-20 MED ORDER — METOPROLOL TARTRATE 25 MG PO TABS: 12.5000 mg | ORAL_TABLET | Freq: Two times a day (BID) | ORAL | Status: DC

## 2012-03-14 ENCOUNTER — Other Ambulatory Visit: Payer: Self-pay | Admitting: Family Medicine

## 2012-03-14 DIAGNOSIS — Z87898 Personal history of other specified conditions: Secondary | ICD-10-CM

## 2012-03-14 DIAGNOSIS — I10 Essential (primary) hypertension: Secondary | ICD-10-CM

## 2012-03-14 DIAGNOSIS — Z125 Encounter for screening for malignant neoplasm of prostate: Secondary | ICD-10-CM

## 2012-03-14 DIAGNOSIS — E119 Type 2 diabetes mellitus without complications: Secondary | ICD-10-CM

## 2012-03-14 DIAGNOSIS — E785 Hyperlipidemia, unspecified: Secondary | ICD-10-CM

## 2012-03-14 DIAGNOSIS — E559 Vitamin D deficiency, unspecified: Secondary | ICD-10-CM

## 2012-03-14 DIAGNOSIS — E669 Obesity, unspecified: Secondary | ICD-10-CM

## 2012-03-18 ENCOUNTER — Other Ambulatory Visit: Payer: Medicare Other

## 2012-03-19 ENCOUNTER — Other Ambulatory Visit (INDEPENDENT_AMBULATORY_CARE_PROVIDER_SITE_OTHER): Payer: Medicare Other

## 2012-03-19 DIAGNOSIS — I10 Essential (primary) hypertension: Secondary | ICD-10-CM

## 2012-03-19 DIAGNOSIS — E785 Hyperlipidemia, unspecified: Secondary | ICD-10-CM | POA: Diagnosis not present

## 2012-03-19 DIAGNOSIS — E119 Type 2 diabetes mellitus without complications: Secondary | ICD-10-CM | POA: Diagnosis not present

## 2012-03-19 DIAGNOSIS — Z125 Encounter for screening for malignant neoplasm of prostate: Secondary | ICD-10-CM

## 2012-03-19 LAB — LIPID PANEL
HDL: 34.1 mg/dL — ABNORMAL LOW (ref >39.00)
Total CHOL/HDL Ratio: 4
Triglycerides: 253 mg/dL — ABNORMAL HIGH (ref 0.0–149.0)
VLDL: 50.6 mg/dL — ABNORMAL HIGH (ref 0.0–40.0)

## 2012-03-19 LAB — HEMOGLOBIN A1C: Hgb A1c MFr Bld: 7 % — ABNORMAL HIGH (ref 4.6–6.5)

## 2012-03-19 LAB — BASIC METABOLIC PANEL
Calcium: 9.5 mg/dL (ref 8.4–10.5)
GFR: 90.66 mL/min (ref >60.00)
Sodium: 138 mEq/L (ref 135–145)

## 2012-03-19 LAB — LDL CHOLESTEROL, DIRECT: Direct LDL: 50.5 mg/dL

## 2012-03-25 ENCOUNTER — Encounter: Payer: Self-pay | Admitting: Family Medicine

## 2012-03-25 ENCOUNTER — Ambulatory Visit (INDEPENDENT_AMBULATORY_CARE_PROVIDER_SITE_OTHER): Payer: Medicare Other | Admitting: Family Medicine

## 2012-03-25 VITALS — BP 136/74 | HR 80 | Temp 97.9°F | Ht 67.5 in | Wt 205.0 lb

## 2012-03-25 DIAGNOSIS — Z0000 Encounter for general adult medical examination without abnormal findings: Secondary | ICD-10-CM | POA: Diagnosis not present

## 2012-03-25 DIAGNOSIS — I2510 Atherosclerotic heart disease of native coronary artery without angina pectoris: Secondary | ICD-10-CM | POA: Diagnosis not present

## 2012-03-25 DIAGNOSIS — E785 Hyperlipidemia, unspecified: Secondary | ICD-10-CM

## 2012-03-25 DIAGNOSIS — E669 Obesity, unspecified: Secondary | ICD-10-CM

## 2012-03-25 DIAGNOSIS — G4700 Insomnia, unspecified: Secondary | ICD-10-CM

## 2012-03-25 DIAGNOSIS — N138 Other obstructive and reflux uropathy: Secondary | ICD-10-CM

## 2012-03-25 DIAGNOSIS — N401 Enlarged prostate with lower urinary tract symptoms: Secondary | ICD-10-CM

## 2012-03-25 DIAGNOSIS — E119 Type 2 diabetes mellitus without complications: Secondary | ICD-10-CM

## 2012-03-25 DIAGNOSIS — N3941 Urge incontinence: Secondary | ICD-10-CM

## 2012-03-25 DIAGNOSIS — Z1331 Encounter for screening for depression: Secondary | ICD-10-CM | POA: Diagnosis not present

## 2012-03-25 DIAGNOSIS — I10 Essential (primary) hypertension: Secondary | ICD-10-CM

## 2012-03-25 MED ORDER — TAMSULOSIN HCL 0.4 MG PO CAPS: 0.4000 mg | ORAL_CAPSULE | Freq: Every day | ORAL | Status: DC

## 2012-03-25 MED ORDER — TRAZODONE HCL 100 MG PO TABS: 100.0000 mg | ORAL_TABLET | Freq: Every day | ORAL | Status: DC

## 2012-03-25 MED ORDER — METFORMIN HCL 500 MG PO TABS: 500.0000 mg | ORAL_TABLET | Freq: Two times a day (BID) | ORAL | Status: DC

## 2012-03-25 NOTE — Assessment & Plan Note (Signed)
Trial of flomax in case BPH related sxs.

## 2012-03-25 NOTE — Assessment & Plan Note (Signed)
Chronic, stable. Continue meds. 

## 2012-03-25 NOTE — Assessment & Plan Note (Signed)
Denies sxs. Continue current medical regimen.

## 2012-03-25 NOTE — Assessment & Plan Note (Signed)
Again encouraged to increase daily walking as part of conscious effort to incorporate activity into routine. Body mass index is 31.63 kg/(m^2).

## 2012-03-25 NOTE — Assessment & Plan Note (Signed)
Reviewed FLP - LDL at goal, but trig too and HDL too low - encouraged increased fish and aerobic exercise. Continue zetia and pravastatin. Consider addition of niacin, while monitoring sugars closely.

## 2012-03-25 NOTE — Assessment & Plan Note (Signed)
Continue trazodone.  Trial of flomax to help with nocturia.

## 2012-03-25 NOTE — Progress Notes (Signed)
Subjective:    Patient ID: Aaron Stafford, male    DOB: Jan 23, 1935, 78 y.o.   MRN: [id number]  HPI CC: medicare wellness visit  Trouble with sleep - bedtime - 10-11pm.  wakes up around 1am to void, then again wakes up at 4am and unable to fall back asleep.  No daytime somnolence.  Continues taking trazodone nightly - up to 100mg  at night.  HTN - tolerating bp meds well.   HLD - no myalgias. DM - compliant with metformin.  Rarely checks sugars.  Last random check was 155. BPH - noticing more urgency, nocturia x2 at a time.  Interested in flomax.  Some increased urgency, key in door phenomenon, some increased dribbling at end of stream. CAD - denies SOB, chest pain or tightness.  preventative:  prostate - h/o BPH, takes finasteride.  No h/o prostate cancer. colonoscopy 2007 - diverticulosis. Done at [facility]. Last tetanus 2011.  Flu shot this year at [place].  Pneumovax 2006 zostavax 2010 Advanced directives. wife/son are pt's HCPOA, advanced directive is at home.  Has not brought copy here.  Denies depression/anhedonia. No falls in last year.  Passes vision and hearing screens today.  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  Past histories reviewed and updated if relevant as below. Patient Active Problem List  Diagnosis  . NEOP, MALIGNANT, SKIN, EAR  . Unspecified vitamin D deficiency  . OBESITY  . ERECTILE DYSFUNCTION  . CORONARY ARTERY DISEASE  . IRRITABLE BOWEL SYNDROME  . FOOT PAIN, BILATERAL  . INSOMNIA  . INCONTINENCE, URGE  . HTN (hypertension)  . HLD (hyperlipidemia)  . Diabetes mellitus type II, controlled  . GERD (gastroesophageal reflux disease)  . Chronic rhinitis  . S/P CABG (coronary artery bypass graft)  .  Bradycardia  . Cervical pain (neck)  . Medicare annual wellness visit, initial  . Abdominal discomfort  . History of BPH  . Urticaria from CT contrast   Past Medical History  Diagnosis Date  . CAD (coronary artery disease)     with stents  . HTN (hypertension)   . HLD (hyperlipidemia)   . Diabetes mellitus type II   . GERD (gastroesophageal reflux disease)   . Chronic rhinitis     ENT [place]  . Ischemic heart disease   . MI (myocardial infarction) 1995  . History of BPH   . Fatty liver 06/2011    by CT  . Diverticulosis 06/2011    by CT   Past Surgical History  Procedure Date  . Cholecystectomy 1985  . Coronary artery bypass graft 05/1993    x6 MI Attemped angioplasty  . Inguinal hernia repair 11/2000    and ventral hernia repair  . Cystoscopy 10/03/08    Normal-Dr. Lonna Cobb  . Ankle surgery 03/2002    Left ankle fracture  . Carotid stent 07/2009    RCA stent   History  Substance Use Topics  . Smoking status: Never Smoker   . Smokeless tobacco: Never Used  . Alcohol Use: No   Family History  Problem Relation Age of Onset  . Kidney failure Mother   . Coronary artery disease Mother   . Heart failure Mother   . Hypertension Brother   . Heart attack Brother   . Alcohol abuse Brother   .  Hypertension Brother   . Hypertension Sister    Allergies  Allergen Reactions  . Morphine Sulfate     REACTION: Hallucinations  . Nitroglycerin     REACTION: Decreased BP  . Contrast Media (Iodinated Diagnostic Agents) Rash    Severe hives   Current Outpatient Prescriptions on File Prior to Visit  Medication Sig Dispense Refill  . amLODipine (NORVASC) 5 MG tablet Take 1 tablet (5 mg total) by mouth daily.  30 tablet  5  . aspirin 81 MG tablet Take 81 mg by mouth daily.        Marland Kitchen ezetimibe (ZETIA) 10 MG tablet Take 1 tablet (10 mg total) by mouth daily.  30 tablet  6  . finasteride (PROSCAR) 5 MG tablet Take 1 tablet (5 mg total) by mouth daily.  30 tablet  6  . furosemide  (LASIX) 20 MG tablet Take 1 tablet (20 mg total) by mouth daily.  30 tablet  5  . hydrochlorothiazide (HYDRODIURIL) 25 MG tablet Take 1 tablet (25 mg total) by mouth daily.  90 tablet  3  . metFORMIN (GLUCOPHAGE) 500 MG tablet Take 1 tablet (500 mg total) by mouth at bedtime.  90 tablet  3  . metoprolol tartrate (LOPRESSOR) 25 MG tablet Take 0.5 tablets (12.5 mg total) by mouth 2 (two) times daily.  90 tablet  1  . pravastatin (PRAVACHOL) 40 MG tablet Take 1 tablet (40 mg total) by mouth at bedtime.  30 tablet  4  . sildenafil (VIAGRA) 100 MG tablet Take 1 tablet (100 mg total) by mouth daily as needed.  10 tablet  5  . traZODone (DESYREL) 50 MG tablet Take 1 tablet (50 mg total) by mouth at bedtime.  90 tablet  1     Review of Systems  Constitutional: Negative for fever, chills, activity change, appetite change, fatigue and unexpected weight change.  HENT: Negative for hearing loss and neck pain.   Eyes: Negative for visual disturbance.  Respiratory: Negative for cough, chest tightness, shortness of breath and wheezing.   Cardiovascular: Negative for chest pain, palpitations and leg swelling.  Gastrointestinal: Negative for nausea, vomiting, abdominal pain, diarrhea, constipation, blood in stool and abdominal distention.  Genitourinary: Negative for hematuria and difficulty urinating.  Musculoskeletal: Negative for myalgias and arthralgias.  Skin: Negative for rash.  Neurological: Negative for dizziness, seizures, syncope and headaches.  Hematological: Does not bruise/bleed easily.  Psychiatric/Behavioral: Negative for dysphoric mood. The patient is not nervous/anxious.        Objective:   Physical Exam  Nursing note and vitals reviewed. Constitutional: He is oriented to person, place, and time. He appears well-developed and well-nourished. No distress.       obese  HENT:  Head: Normocephalic and atraumatic.  Right Ear: Hearing, tympanic membrane, external ear and ear canal normal.   Left Ear: Hearing, tympanic membrane, external ear and ear canal normal.  Nose: Nose normal.  Mouth/Throat: Oropharynx is clear and moist. No oropharyngeal exudate.  Eyes: Conjunctivae normal and EOM are normal. Pupils are equal, round, and reactive to light. No scleral icterus.  Neck: Normal range of motion. Neck supple. Carotid bruit is not present.  Cardiovascular: Normal rate, regular rhythm, normal heart sounds and intact distal pulses.   No murmur heard. Pulses:      Radial pulses are 2+ on the right side, and 2+ on the left side.  Pulmonary/Chest: Effort normal and breath sounds normal. No respiratory distress. He has no wheezes. He has no rales.  Abdominal: Soft. Bowel sounds are normal. He exhibits no distension and no mass. There is no tenderness. There is no rebound and no guarding.       obese  Genitourinary: Prostate normal. Rectal exam shows external hemorrhoid (noninflamed). Rectal exam shows no internal hemorrhoid, no fissure, no mass, no tenderness and anal tone normal. Prostate is not enlarged (15-20gm) and not tender.  Musculoskeletal: Normal range of motion. He exhibits no edema.  Lymphadenopathy:    He has no cervical adenopathy.  Neurological: He is alert and oriented to person, place, and time.       CN grossly intact, station and gait intact  Skin: Skin is warm and dry. No rash noted.  Psychiatric: He has a normal mood and affect. His behavior is normal. Judgment and thought content normal.       Assessment & Plan:

## 2012-03-25 NOTE — Assessment & Plan Note (Signed)
Compliant with finasteride. Persistent sxs despite this (in normal PSA/DRE) Will restart flomax.  Discussed hypotension risk of this med.  No upcoming eye procedures.

## 2012-03-25 NOTE — Patient Instructions (Addendum)
Sign release for colonoscopy from [facility]. A1c returned a bit elevated but still stable.  I'd like to increase metformin to 500mg  twice daily.  New prescription sent in. Try flomax (tamsulosin) for voiding and prostate - continue finasteride.  Ensure getting plenty of fluid with this - and watch for low blood pressure on med. Return in 6 months for follow up

## 2012-03-25 NOTE — Assessment & Plan Note (Signed)
Chronic.   Reviewed A1c trending up - suggested we increase metformin to bid dosing. Continue to encourage increased activity/exercise for goal of weight loss.

## 2012-03-25 NOTE — Assessment & Plan Note (Signed)
I have personally reviewed the Medicare Annual Wellness questionnaire and have noted 1. The patient's medical and social history 2. Their use of alcohol, tobacco or illicit drugs 3. Their current medications and supplements 4. The patient's functional ability including ADL's, fall risks, home safety risks and hearing or visual impairment. 5. Diet and physical activity 6. Evidence for depression or mood disorders The patients weight, height, BMI have been recorded in the chart.  Hearing and vision has been addressed. I have made referrals, counseling and provided education to the patient based review of the above and I have provided the pt with a written personalized care plan for preventive services. See scanned questionairre. Advanced directives discussed.  Reviewed preventative protocols and updated unless pt declined. Reviewed blood work in detail - see below. UTD immunizations. Have requested copy of colonoscopy from [facility].

## 2012-03-26 ENCOUNTER — Other Ambulatory Visit: Payer: Self-pay | Admitting: *Deleted

## 2012-03-26 MED ORDER — HYDROCHLOROTHIAZIDE 25 MG PO TABS: 25.0000 mg | ORAL_TABLET | Freq: Every day | ORAL | Status: DC

## 2012-03-30 ENCOUNTER — Other Ambulatory Visit: Payer: Self-pay | Admitting: *Deleted

## 2012-03-30 MED ORDER — TAMSULOSIN HCL 0.4 MG PO CAPS: 0.4000 mg | ORAL_CAPSULE | Freq: Every day | ORAL | Status: DC

## 2012-03-31 ENCOUNTER — Encounter: Payer: Self-pay | Admitting: Family Medicine

## 2012-04-23 DIAGNOSIS — L578 Other skin changes due to chronic exposure to nonionizing radiation: Secondary | ICD-10-CM | POA: Diagnosis not present

## 2012-04-23 DIAGNOSIS — Z85820 Personal history of malignant melanoma of skin: Secondary | ICD-10-CM | POA: Diagnosis not present

## 2012-04-23 DIAGNOSIS — L820 Inflamed seborrheic keratosis: Secondary | ICD-10-CM | POA: Diagnosis not present

## 2012-04-23 DIAGNOSIS — Z85828 Personal history of other malignant neoplasm of skin: Secondary | ICD-10-CM | POA: Diagnosis not present

## 2012-04-23 DIAGNOSIS — D239 Other benign neoplasm of skin, unspecified: Secondary | ICD-10-CM | POA: Diagnosis not present

## 2012-04-23 DIAGNOSIS — L821 Other seborrheic keratosis: Secondary | ICD-10-CM | POA: Diagnosis not present

## 2012-04-23 DIAGNOSIS — L570 Actinic keratosis: Secondary | ICD-10-CM | POA: Diagnosis not present

## 2012-06-03 ENCOUNTER — Encounter: Payer: Self-pay | Admitting: Family Medicine

## 2012-06-03 ENCOUNTER — Ambulatory Visit (INDEPENDENT_AMBULATORY_CARE_PROVIDER_SITE_OTHER): Payer: Medicare Other | Admitting: Family Medicine

## 2012-06-03 VITALS — BP 148/84 | HR 68 | Temp 97.6°F | Wt 201.5 lb

## 2012-06-03 DIAGNOSIS — S80869A Insect bite (nonvenomous), unspecified lower leg, initial encounter: Secondary | ICD-10-CM | POA: Insufficient documentation

## 2012-06-03 DIAGNOSIS — S80862A Insect bite (nonvenomous), left lower leg, initial encounter: Secondary | ICD-10-CM

## 2012-06-03 DIAGNOSIS — W57XXXA Bitten or stung by nonvenomous insect and other nonvenomous arthropods, initial encounter: Secondary | ICD-10-CM | POA: Diagnosis not present

## 2012-06-03 DIAGNOSIS — S90569A Insect bite (nonvenomous), unspecified ankle, initial encounter: Secondary | ICD-10-CM | POA: Diagnosis not present

## 2012-06-03 MED ORDER — DOXYCYCLINE HYCLATE 100 MG PO CAPS: 100.0000 mg | ORAL_CAPSULE | Freq: Two times a day (BID) | ORAL | Status: DC

## 2012-06-03 NOTE — Patient Instructions (Signed)
Watch for fever, new rash developing, headaches, joint pains, abdominal pain over the next 10 days.   If this happens, fill antibiotic provided today. Otherwise, ok to watch local reaction to tick bite - treat with continued anti bacterial ointment and bandaid, should improve with time.  Deer Tick Bite Deer ticks are brown arachnids (spider family) that vary in size from as small as the head of a pin to 1/4 inch (1/2 cm) diameter. They thrive in wooded areas. Deer are the preferred host of adult deer ticks. Small rodents are the host of young ticks (nymphs). When a person walks in a field or wooded area, young and adult ticks in the surrounding grass and vegetation can attach themselves to the skin. They can suck blood for hours to days if unnoticed. Ticks are found all over the [place] Some ticks carry a specific bacteria (Borrelia burgdorferi) that causes an infection called Lyme disease. The bacteria is typically passed into a person during the blood sucking process. This happens after the tick has been attached for at least a number of hours. While ticks can be found all over the [place] those carrying the bacteria that causes Lyme disease are most common in [place] and the [place]. Only a small proportion of ticks in these areas carry the Lyme disease bacteria and cause human infections. Ticks usually attach to warm spots on the body, such as the:  Head.  Back.  Neck.  Armpits.  Groin. SYMPTOMS  Most of the time, a deer tick bite will not be felt. You may or may not see the attached tick. You may notice mild irritation or redness around the bite site. If the deer tick passes the Lyme disease bacteria to a person, a round, red rash may be noticed 2 to 3 days after the bite. The rash may be clear in the middle, like a bull's-eye or target. If not treated, other symptoms may develop several days to weeks after the onset of the rash. These symptoms may include:  New rash lesions.  Fatigue and  weakness.  General ill feeling and achiness.  Chills.  Headache and neck pain.  Swollen lymph glands.  Sore muscles and joints. 5 to 15% of untreated people with Lyme disease may develop more severe illnesses after several weeks to months. This may include inflammation of the brain lining (meningitis), nerve palsies, an abnormal heartbeat, or severe muscle and joint pain and inflammation (myositis or arthritis). DIAGNOSIS   Physical exam and medical history.  Viewing the tick if it was saved for confirmation.  Blood tests (to check or confirm the presence of Lyme disease). TREATMENT  Most ticks do not carry disease. If found, an attached tick should be removed using tweezers. Tweezers should be placed under the body of the tick so it is removed by its attachment parts (pincers). If there are signs or symptoms of being sick, or Lyme disease is confirmed, medicines (antibiotics) that kill germs are usually prescribed. In more severe cases, antibiotics may be given through an intravenous (IV) access. HOME CARE INSTRUCTIONS   Always remove ticks with tweezers. Do not use petroleum jelly or other methods to kill or remove the tick. Slide the tweezers under the body and pull out as much as you can. If you are not sure what it is, save it in a jar and show your caregiver.  Once you remove the tick, the skin will heal on its own. Wash your hands and the affected area with water  and soap. You may place a bandage on the affected area.  Take medicine as directed. You may be advised to take a full course of antibiotics.  Follow up with your caregiver as recommended. FINDING OUT THE RESULTS OF YOUR TEST Not all test results are available during your visit. If your test results are not back during the visit, make an appointment with your caregiver to find out the results. Do not assume everything is normal if you have not heard from your caregiver or the medical facility. It is important for you to  follow up on all of your test results. PROGNOSIS  If Lyme disease is confirmed, early treatment with antibiotics is very effective. Following preventive guidelines is important since it is possible to get the disease more than once. PREVENTION   Wear long sleeves and long pants in wooded or grassy areas. Tuck your pants into your socks.  Use an insect repellent while hiking.  Check yourself, your children, and your pets regularly for ticks after playing outside.  Clear piles of leaves or brush from your yard. Ticks might live there. SEEK MEDICAL CARE IF:   You or your child has an oral temperature above 102 F (38.9 C).  You develop a severe headache following the bite.  You feel generally ill.  You notice a rash.  You are having trouble removing the tick.  The bite area has red skin or yellow drainage. SEEK IMMEDIATE MEDICAL CARE IF:   Your face is weak and droopy or you have other neurological symptoms.  You have severe joint pain or weakness. MAKE SURE YOU:   Understand these instructions.  Will watch your condition.  Will get help right away if you are not doing well or get worse. FOR MORE INFORMATION Centers for Disease Control and Prevention: FootballExhibition.com.br American Academy of [employer] Physicians: www.https://powers.com/ Document Released: 05/01/2009 Document Revised: 04/29/2011 Document Reviewed: 05/01/2009 [facility] Patient Information 2013 [place], [place].

## 2012-06-03 NOTE — Progress Notes (Signed)
  Subjective:    Patient ID: Aaron Stafford, male    DOB: 02/27/1934, 78 y.o.   MRN: [id number]  HPI CC:  Tick bite  Brings tick - looks like deer tick.  Sunday noticed tick on posterior L popliteal area.  Removed sunday night.  <24 hour after tick attachment. Thinks was exposed Saturday night.  Initially thought mosquito bite because it was itchy.  Area raised, knot present, staying itchy and tender.    No rash he's noticed. Using bandaid and neosporin.  Past Medical History  Diagnosis Date  . CAD (coronary artery disease)     with stents  . HTN (hypertension)   . HLD (hyperlipidemia)   . Diabetes mellitus type II   . GERD (gastroesophageal reflux disease)   . Chronic rhinitis     ENT [place]  . Ischemic heart disease   . MI (myocardial infarction) 1995  . History of BPH   . Fatty liver 06/2011    by CT  . Diverticulosis 06/2011    by CT and colonoscopy     Review of Systems Per HPI    Objective:   Physical Exam WDWN CM NAD Left lateral popliteal area with small indurated scab without residual foreign object, mild surrounding erythema     Assessment & Plan:

## 2012-06-03 NOTE — Assessment & Plan Note (Signed)
With localized skin reaction to bite. No evidence of lyme disease rash. discussed red flags to watch for including fever, new rashes, HA, abd pain/nausea, joint pains. If this happens, fill abx (provided with 10d course doxycycline). O/w continue to treat as up to now.

## 2012-06-22 ENCOUNTER — Ambulatory Visit (INDEPENDENT_AMBULATORY_CARE_PROVIDER_SITE_OTHER): Payer: Medicare Other | Admitting: Family Medicine

## 2012-06-22 ENCOUNTER — Encounter: Payer: Self-pay | Admitting: Family Medicine

## 2012-06-22 VITALS — BP 130/70 | HR 68 | Temp 97.7°F | Wt 202.5 lb

## 2012-06-22 DIAGNOSIS — S80862D Insect bite (nonvenomous), left lower leg, subsequent encounter: Secondary | ICD-10-CM

## 2012-06-22 DIAGNOSIS — Z5189 Encounter for other specified aftercare: Secondary | ICD-10-CM | POA: Diagnosis not present

## 2012-06-22 NOTE — Patient Instructions (Signed)
Tick bite is looking better. I would start using warm compresses to that area to see if quicker improvement. If not better, let me know for short topical steroid course.

## 2012-06-22 NOTE — Assessment & Plan Note (Signed)
Looking better than prior. Reassured.  Recommended start warm compresses and not pick scab off if reforms. If not better with this, consider topical steroid course.  Pt agrees with plan.

## 2012-06-22 NOTE — Progress Notes (Signed)
  Subjective:    Patient ID: Aaron Stafford, male    DOB: Aug 28, 1934, 78 y.o.   MRN: [id number]  HPI CC: f/u tick bite  See prior note for details.  Ended up filling doxy unclear why. Last week area was more itchy and more swollen,red.  Over weekend has actually improved. States looking better today.  Past Medical History  Diagnosis Date  . CAD (coronary artery disease)     with stents  . HTN (hypertension)   . HLD (hyperlipidemia)   . Diabetes mellitus type II   . GERD (gastroesophageal reflux disease)   . Chronic rhinitis     ENT [place]  . Ischemic heart disease   . MI (myocardial infarction) 1995  . History of BPH   . Fatty liver 06/2011    by CT  . Diverticulosis 06/2011    by CT and colonoscopy    Review of Systems Per HPI    Objective:   Physical Exam NAD, WDWN CM. Left lateral popliteal area with small indurated scab without residual foreign object, minimal surrounding erythema     Assessment & Plan:

## 2012-07-03 ENCOUNTER — Other Ambulatory Visit: Payer: Self-pay

## 2012-07-03 MED ORDER — FUROSEMIDE 20 MG PO TABS: 20.0000 mg | ORAL_TABLET | Freq: Every day | ORAL | Status: DC

## 2012-07-03 MED ORDER — AMLODIPINE BESYLATE 5 MG PO TABS: 5.0000 mg | ORAL_TABLET | Freq: Every day | ORAL | Status: DC

## 2012-07-03 NOTE — Telephone Encounter (Signed)
Refill sent for amlodipine.  

## 2012-07-10 ENCOUNTER — Other Ambulatory Visit: Payer: Self-pay | Admitting: *Deleted

## 2012-07-10 MED ORDER — PRAVASTATIN SODIUM 40 MG PO TABS: 40.0000 mg | ORAL_TABLET | Freq: Every day | ORAL | Status: DC

## 2012-07-10 NOTE — Telephone Encounter (Signed)
Refilled Pravastatin sent to [facility].

## 2012-07-27 ENCOUNTER — Encounter: Payer: Self-pay | Admitting: Family Medicine

## 2012-07-27 ENCOUNTER — Ambulatory Visit (INDEPENDENT_AMBULATORY_CARE_PROVIDER_SITE_OTHER): Payer: Medicare Other | Admitting: Family Medicine

## 2012-07-27 VITALS — BP 130/72 | HR 64 | Temp 98.0°F | Wt 204.8 lb

## 2012-07-27 DIAGNOSIS — R21 Rash and other nonspecific skin eruption: Secondary | ICD-10-CM | POA: Diagnosis not present

## 2012-07-27 DIAGNOSIS — T148 Other injury of unspecified body region: Secondary | ICD-10-CM | POA: Diagnosis not present

## 2012-07-27 DIAGNOSIS — S80862S Insect bite (nonvenomous), left lower leg, sequela: Secondary | ICD-10-CM

## 2012-07-27 DIAGNOSIS — IMO0002 Reserved for concepts with insufficient information to code with codable children: Secondary | ICD-10-CM

## 2012-07-27 MED ORDER — FUROSEMIDE 20 MG PO TABS: 20.0000 mg | ORAL_TABLET | Freq: Every day | ORAL | Status: DC

## 2012-07-27 MED ORDER — SILDENAFIL CITRATE 100 MG PO TABS: 100.0000 mg | ORAL_TABLET | Freq: Every day | ORAL | Status: DC | PRN

## 2012-07-27 MED ORDER — EZETIMIBE 10 MG PO TABS: 10.0000 mg | ORAL_TABLET | Freq: Every day | ORAL | Status: DC

## 2012-07-27 MED ORDER — METOPROLOL TARTRATE 25 MG PO TABS: 12.5000 mg | ORAL_TABLET | Freq: Two times a day (BID) | ORAL | Status: DC

## 2012-07-27 MED ORDER — TRAZODONE HCL 100 MG PO TABS: 100.0000 mg | ORAL_TABLET | Freq: Every day | ORAL | Status: DC

## 2012-07-27 MED ORDER — HYDROCHLOROTHIAZIDE 25 MG PO TABS: 25.0000 mg | ORAL_TABLET | Freq: Every day | ORAL | Status: DC

## 2012-07-27 MED ORDER — FINASTERIDE 5 MG PO TABS: 5.0000 mg | ORAL_TABLET | Freq: Every day | ORAL | Status: DC

## 2012-07-27 MED ORDER — AMLODIPINE BESYLATE 5 MG PO TABS: 5.0000 mg | ORAL_TABLET | Freq: Every day | ORAL | Status: DC

## 2012-07-27 MED ORDER — METFORMIN HCL 500 MG PO TABS: 500.0000 mg | ORAL_TABLET | Freq: Two times a day (BID) | ORAL | Status: DC

## 2012-07-27 MED ORDER — TAMSULOSIN HCL 0.4 MG PO CAPS: 0.4000 mg | ORAL_CAPSULE | Freq: Every day | ORAL | Status: DC

## 2012-07-27 MED ORDER — PRAVASTATIN SODIUM 40 MG PO TABS: 40.0000 mg | ORAL_TABLET | Freq: Every day | ORAL | Status: DC

## 2012-07-27 NOTE — Progress Notes (Signed)
  Subjective:    Patient ID: Aaron Stafford, male    DOB: [birth year], 78 y.o.   MRN: [id number]  HPI CC: L leg tick bite recheck  See prior notes for details. Tick bite occurred 05/31/2012, exposure on skin <24 hours. Completed 10d course of doxy mid April.  L popliteal rash after tick bite - rash has spread.  Has scratched rash.  Very itchy.  Has used OTC cortisone salve. Last night also used insect bite lotion which helps with itch.  No new rash, fever, joint pain, or pain at site. Has derm appt at 1:30pm for eval.  Past Medical History  Diagnosis Date  . CAD (coronary artery disease)     with stents  . HTN (hypertension)   . HLD (hyperlipidemia)   . Diabetes mellitus type II   . GERD (gastroesophageal reflux disease)   . Chronic rhinitis     ENT [place]  . Ischemic heart disease   . MI (myocardial infarction) 1995  . History of BPH   . Fatty liver 06/2011    by CT  . Diverticulosis 06/2011    by CT and colonoscopy     Review of Systems Per HPI     Objective:   Physical Exam NAD, WDWN CM.  Left lateral popliteal area with induration now surrounding prior tick bite, along with raised erythema, blancheable, as well as papules around lesion site.    Assessment & Plan:

## 2012-07-27 NOTE — Assessment & Plan Note (Signed)
With anticipated residual post-inflammatory skin changes around site - not consistent with EM, and regardless has completed 10d course doxy.  No other systemic sxs. rec keep appt with derm - and will no charge today's visit as no changes done to treatment regimen today. Offered borrelia burgdorferi titers - will defer for now.

## 2012-07-27 NOTE — Patient Instructions (Signed)
No charge today. keep appointment with Dr. Gwen Pounds. Good to see you, call us with questions.

## 2012-08-10 ENCOUNTER — Other Ambulatory Visit: Payer: Self-pay | Admitting: *Deleted

## 2012-08-10 MED ORDER — METOPROLOL TARTRATE 25 MG PO TABS: 12.5000 mg | ORAL_TABLET | Freq: Two times a day (BID) | ORAL | Status: DC

## 2012-08-10 MED ORDER — TAMSULOSIN HCL 0.4 MG PO CAPS: 0.4000 mg | ORAL_CAPSULE | Freq: Every day | ORAL | Status: DC

## 2012-08-10 MED ORDER — FUROSEMIDE 20 MG PO TABS: 20.0000 mg | ORAL_TABLET | Freq: Every day | ORAL | Status: DC

## 2012-08-10 MED ORDER — TRAZODONE HCL 100 MG PO TABS: 100.0000 mg | ORAL_TABLET | Freq: Every day | ORAL | Status: DC

## 2012-08-10 MED ORDER — FINASTERIDE 5 MG PO TABS: 5.0000 mg | ORAL_TABLET | Freq: Every day | ORAL | Status: DC

## 2012-08-10 MED ORDER — EZETIMIBE 10 MG PO TABS: 10.0000 mg | ORAL_TABLET | Freq: Every day | ORAL | Status: DC

## 2012-08-10 MED ORDER — PRAVASTATIN SODIUM 40 MG PO TABS: 40.0000 mg | ORAL_TABLET | Freq: Every day | ORAL | Status: DC

## 2012-08-10 MED ORDER — AMLODIPINE BESYLATE 5 MG PO TABS: 5.0000 mg | ORAL_TABLET | Freq: Every day | ORAL | Status: DC

## 2012-08-10 MED ORDER — HYDROCHLOROTHIAZIDE 25 MG PO TABS: 25.0000 mg | ORAL_TABLET | Freq: Every day | ORAL | Status: DC

## 2012-08-10 MED ORDER — SILDENAFIL CITRATE 100 MG PO TABS: 100.0000 mg | ORAL_TABLET | Freq: Every day | ORAL | Status: DC | PRN

## 2012-08-10 MED ORDER — METFORMIN HCL 500 MG PO TABS: 500.0000 mg | ORAL_TABLET | Freq: Two times a day (BID) | ORAL | Status: DC

## 2012-08-10 NOTE — Telephone Encounter (Signed)
Resent all meds due to pharmacy saying they did not receive refills on 07-27-12 for any medication.

## 2012-08-27 ENCOUNTER — Other Ambulatory Visit: Payer: Self-pay

## 2012-08-31 DIAGNOSIS — L299 Pruritus, unspecified: Secondary | ICD-10-CM | POA: Diagnosis not present

## 2012-08-31 DIAGNOSIS — R209 Unspecified disturbances of skin sensation: Secondary | ICD-10-CM | POA: Diagnosis not present

## 2012-08-31 DIAGNOSIS — W57XXXA Bitten or stung by nonvenomous insect and other nonvenomous arthropods, initial encounter: Secondary | ICD-10-CM | POA: Diagnosis not present

## 2012-09-22 ENCOUNTER — Ambulatory Visit: Payer: Medicare Other | Admitting: Family Medicine

## 2012-09-26 ENCOUNTER — Ambulatory Visit: Payer: Medicare Other | Admitting: Family Medicine

## 2012-09-26 ENCOUNTER — Encounter: Payer: Self-pay | Admitting: Family Medicine

## 2012-09-26 ENCOUNTER — Ambulatory Visit (INDEPENDENT_AMBULATORY_CARE_PROVIDER_SITE_OTHER): Payer: Medicare Other | Admitting: Family Medicine

## 2012-09-26 VITALS — BP 174/82 | HR 56 | Temp 97.5°F | Ht 67.5 in | Wt 203.1 lb

## 2012-09-26 DIAGNOSIS — L0291 Cutaneous abscess, unspecified: Secondary | ICD-10-CM | POA: Diagnosis not present

## 2012-09-26 DIAGNOSIS — I10 Essential (primary) hypertension: Secondary | ICD-10-CM

## 2012-09-26 DIAGNOSIS — L0390 Cellulitis, unspecified: Secondary | ICD-10-CM

## 2012-09-26 DIAGNOSIS — B029 Zoster without complications: Secondary | ICD-10-CM

## 2012-09-26 HISTORY — DX: Zoster without complications: B02.9

## 2012-09-26 MED ORDER — CEPHALEXIN 500 MG PO CAPS: 500.0000 mg | ORAL_CAPSULE | Freq: Three times a day (TID) | ORAL | Status: DC

## 2012-09-26 MED ORDER — TRAMADOL HCL 50 MG PO TABS: 50.0000 mg | ORAL_TABLET | Freq: Three times a day (TID) | ORAL | Status: DC | PRN

## 2012-09-26 MED ORDER — ACYCLOVIR 400 MG PO TABS: 400.0000 mg | ORAL_TABLET | Freq: Every day | ORAL | Status: DC

## 2012-09-26 NOTE — Assessment & Plan Note (Signed)
Elevated with rushing in here and in pain. Will not change meds today

## 2012-09-26 NOTE — Progress Notes (Signed)
Patient ID: Aaron Stafford, male   DOB: 14-Sep-1934, 78 y.o.   MRN: [id number] Aaron Stafford [id number] [birth year] 09/26/2012      Progress Note-Follow Up  Subjective  Chief Complaint  Chief Complaint  Patient presents with  . sore on foot    sore on top of Left foot- pt concerned due to being a diabetic- noticed 2 days ago    HPI  Patient is a 35 rolled Caucasian male with a history of diabetes and profound neuropathy. He complains of a 2 day history of rash on the top of his left foot. It is increasing in size and discomfort over the last 24 hours. Initially was just a red spot and now has blisters on top. He describes the pain as burning and electrical. Shooting from her rash up into his great toe. No fevers or chills. He denies any trauma. He questioned whether it may have caused irritation on his skin but he has when he would many times before without any abrasion. No complaint of headache, chest pain or palpitations, malaise or fevers. Has been cleaning the area with soap and water and applying antibiotic ointment unfortunately pain continues to worsen  Past Medical History  Diagnosis Date  . CAD (coronary artery disease)     with stents  . HTN (hypertension)   . HLD (hyperlipidemia)   . Diabetes mellitus type II   . GERD (gastroesophageal reflux disease)   . Chronic rhinitis     ENT [place]  . Ischemic heart disease   . MI (myocardial infarction) 1995  . History of BPH   . Fatty liver 06/2011    by CT  . Diverticulosis 06/2011    by CT and colonoscopy  . Shingles rash 09/26/2012    Past Surgical History  Procedure Laterality Date  . Cholecystectomy  1985  . Coronary artery bypass graft  05/1993    x6 MI Attemped angioplasty  . Inguinal hernia repair  11/2000    and ventral hernia repair  . Cystoscopy  10/03/08    Normal-Dr. Lonna Cobb  . Ankle surgery  03/2002    Left ankle fracture  . Carotid stent  07/2009    RCA stent  . Colonoscopy  03/2005    diverticulosis,  tortuous colon with looping Marva Panda)    Family History  Problem Relation Age of Onset  . Kidney failure Mother   . Coronary artery disease Mother   . Heart failure Mother   . Hypertension Brother   . Heart attack Brother   . Alcohol abuse Brother   . Hypertension Brother   . Hypertension Sister     History   Social History  . Marital Status: Married    Spouse Name: N/A    Number of Children: 5  . Years of Education: N/A   Occupational History  . [employer]    Social History Main Topics  . Smoking status: Never Smoker   . Smokeless tobacco: Never Used  . Alcohol Use: No  . Drug Use: No  . Sexually Active: Not on file   Other Topics Concern  . 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 amLODipine (NORVASC) 5 MG tablet Take 1 tablet (5 mg total) by mouth daily.  90 tablet  3  . aspirin 81 MG tablet Take 81 mg by mouth daily.        Marland Kitchen ezetimibe (ZETIA) 10 MG tablet Take 1 tablet (10 mg total) by mouth daily.  90 tablet  3  . finasteride (PROSCAR) 5 MG tablet Take 1 tablet (5 mg total) by mouth daily.  90 tablet  3  . furosemide (LASIX) 20 MG tablet Take 1 tablet (20 mg total) by mouth daily.  90 tablet  3  . hydrochlorothiazide (HYDRODIURIL) 25 MG tablet Take 1 tablet (25 mg total) by mouth daily.  90 tablet  3  . metFORMIN (GLUCOPHAGE) 500 MG tablet Take 1 tablet (500 mg total) by mouth 2 (two) times daily with a meal.  180 tablet  3  . metoprolol tartrate (LOPRESSOR) 25 MG tablet Take 0.5 tablets (12.5 mg total) by mouth 2 (two) times daily.  90 tablet  3  . pravastatin (PRAVACHOL) 40 MG tablet Take 1 tablet (40 mg total) by mouth at bedtime.  90 tablet  3   . sildenafil (VIAGRA) 100 MG tablet Take 1 tablet (100 mg total) by mouth daily as needed.  10 tablet  5  . tamsulosin (FLOMAX) 0.4 MG CAPS Take 1 capsule (0.4 mg total) by mouth daily.  90 capsule  3  . traZODone (DESYREL) 100 MG tablet Take 1 tablet (100 mg total) by mouth at bedtime.  90 tablet  3   No current facility-administered medications on file prior to visit.    Allergies  Allergen Reactions  . Morphine Sulfate     REACTION: Hallucinations  . Nitroglycerin     REACTION: Decreased BP  . Contrast Media (Iodinated Diagnostic Agents) Rash    Severe hives    Review of Systems  Review of Systems  Constitutional: Positive for malaise/fatigue. Negative for fever.  HENT: Negative for congestion.   Eyes: Negative for discharge.  Respiratory: Negative for shortness of breath.   Cardiovascular: Negative for chest pain, palpitations and leg swelling.  Gastrointestinal: Negative for nausea, abdominal pain and diarrhea.  Genitourinary: Negative for dysuria.  Musculoskeletal: Positive for joint pain. Negative for falls.       Right foot pain, burning pain shoots from rash up toe, kept him up last night  Skin: Positive for rash.       Left foot x 2 days  Neurological: Negative for loss of consciousness and headaches.  Endo/Heme/Allergies: Negative for polydipsia.  Psychiatric/Behavioral: Negative for depression and suicidal ideas. The patient is not nervous/anxious and does not have insomnia.     Objective  BP 174/82  Pulse 56  Temp(Src) 97.5 F (36.4 C) (Oral)  Ht 5' 7.5" (1.715 m)  Wt 203 lb 1.3 oz (92.116 kg)  BMI 31.32 kg/m2  SpO2 95%  Physical Exam  Physical Exam  Constitutional: He is oriented to person, place, and time and well-developed, well-nourished, and in no distress. No distress.  HENT:  Head: Normocephalic and atraumatic.  Eyes: Conjunctivae are normal.  Neck: Neck supple. No thyromegaly present.  Cardiovascular: Normal rate, regular rhythm and normal  heart sounds.   No murmur heard. Pulmonary/Chest: Effort normal and breath sounds normal. No respiratory distress.  Abdominal: He exhibits no distension and no mass. There is no tenderness.  Musculoskeletal: He exhibits no edema.  Neurological: He is alert and oriented to person, place, and time.  Skin: Skin is warm.  Rash noted. There is erythema.  Left foot, top is a cluster of vesicles with an erythematous base overlying 1st and 2nd metatarsals.  Psychiatric: Memory, affect and judgment normal.    Lab Results  Component Value Date   TSH 2.143 04/06/2010   Lab Results  Component Value Date   WBC 7.3 07/16/2011   HGB 15.7 07/16/2011   HCT 46.3 07/16/2011   MCV 92.0 07/16/2011   PLT 211.0 07/16/2011   Lab Results  Component Value Date   CREATININE 0.9 03/19/2012   BUN 17 03/19/2012   NA 138 03/19/2012   K 4.2 03/19/2012   CL 99 03/19/2012   CO2 31 03/19/2012   Lab Results  Component Value Date   ALT 41 07/16/2011   AST 29 07/16/2011   ALKPHOS 52 07/16/2011   BILITOT 1.0 07/16/2011   Lab Results  Component Value Date   CHOL 120 03/19/2012   Lab Results  Component Value Date   HDL 34.10* 03/19/2012   Lab Results  Component Value Date   LDLCALC 37 05/23/2010   Lab Results  Component Value Date   TRIG 253.0* 03/19/2012   Lab Results  Component Value Date   CHOLHDL 4 03/19/2012     Assessment & Plan  Shingles rash Started on Acyclovir, due to high risk of secondary infection from diabetes and peripheral neuropathy. Is given a prescription for Keflex to start if secondary cellulitis develops or spreads. Given Tramadol for pain and patient has some Lyrica at home he was using to treat peripheral neuropathy but stopped. He is to start 1 at bedtime and titrate up to 2 qhs and one in am as needed or as tolerated. Return ot see PMD if symptoms worse  HTN (hypertension) Elevated with rushing in here and in pain. Will not change meds today

## 2012-09-26 NOTE — Assessment & Plan Note (Signed)
Started on Acyclovir, due to high risk of secondary infection from diabetes and peripheral neuropathy. Is given a prescription for Keflex to start if secondary cellulitis develops or spreads. Given Tramadol for pain and patient has some Lyrica at home he was using to treat peripheral neuropathy but stopped. He is to start 1 at bedtime and titrate up to 2 qhs and one in am as needed or as tolerated. Return ot see PMD if symptoms worse

## 2012-09-26 NOTE — Patient Instructions (Addendum)
Use the Lyrica you have at home already but are not taking, start with 1 tab at bedtime, then increase to 1 tab twice a day and then to 2 tabs at bed and one in am if tolerated but pain not controlled    Shingles Shingles (herpes zoster) is an infection that is caused by the same virus that causes chickenpox (varicella). The infection causes a painful skin rash and fluid-filled blisters, which eventually break open, crust over, and heal. It may occur in any area of the body, but it usually affects only one side of the body or face. The pain of shingles usually lasts about 1 month. However, some people with shingles may develop long-term (chronic) pain in the affected area of the body. Shingles often occurs many years after the person had chickenpox. It is more common:  In people older than 50 years.  In people with weakened immune systems, such as those with HIV, AIDS, or cancer.  In people taking medicines that weaken the immune system, such as transplant medicines.  In people under great stress. CAUSES  Shingles is caused by the varicella zoster virus (VZV), which also causes chickenpox. After a person is infected with the virus, it can remain in the person's body for years in an inactive state (dormant). To cause shingles, the virus reactivates and breaks out as an infection in a nerve root. The virus can be spread from person to person (contagious) through contact with open blisters of the shingles rash. It will only spread to people who have not had chickenpox. When these people are exposed to the virus, they may develop chickenpox. They will not develop shingles. Once the blisters scab over, the person is no longer contagious and cannot spread the virus to others. SYMPTOMS  Shingles shows up in stages. The initial symptoms may be pain, itching, and tingling in an area of the skin. This pain is usually described as burning, stabbing, or throbbing.In a few days or weeks, a painful red rash  will appear in the area where the pain, itching, and tingling were felt. The rash is usually on one side of the body in a band or belt-like pattern. Then, the rash usually turns into fluid-filled blisters. They will scab over and dry up in approximately 2 3 weeks. Flu-like symptoms may also occur with the initial symptoms, the rash, or the blisters. These may include:  Fever.  Chills.  Headache.  Upset stomach. DIAGNOSIS  Your caregiver will perform a skin exam to diagnose shingles. Skin scrapings or fluid samples may also be taken from the blisters. This sample will be examined under a microscope or sent to a lab for further testing. TREATMENT  There is no specific cure for shingles. Your caregiver will likely prescribe medicines to help you manage the pain, recover faster, and avoid long-term problems. This may include antiviral drugs, anti-inflammatory drugs, and pain medicines. HOME CARE INSTRUCTIONS   Take a cool bath or apply cool compresses to the area of the rash or blisters as directed. This may help with the pain and itching.   Only take over-the-counter or prescription medicines as directed by your caregiver.   Rest as directed by your caregiver.  Keep your rash and blisters clean with mild soap and cool water or as directed by your caregiver.  Do not pick your blisters or scratch your rash. Apply an anti-itch cream or numbing creams to the affected area as directed by your caregiver.  Keep your shingles rash covered  with a loose bandage (dressing).  Avoid skin contact with:  Babies.   Pregnant women.   Children with eczema.   Elderly people with transplants.   People with chronic illnesses, such as leukemia or AIDS.   Wear loose-fitting clothing to help ease the pain of material rubbing against the rash.  Keep all follow-up appointments with your caregiver.If the area involved is on your face, you may receive a referral for follow-up to a specialist,  such as an eye doctor (ophthalmologist) or an ear, nose, and throat (ENT) doctor. Keeping all follow-up appointments will help you avoid eye complications, chronic pain, or disability.  SEEK IMMEDIATE MEDICAL CARE IF:   You have facial pain, pain around the eye area, or loss of feeling on one side of your face.  You have ear pain or ringing in your ear.  You have loss of taste.  Your pain is not relieved with prescribed medicines.   Your redness or swelling spreads.   You have more pain and swelling.  Your condition is worsening or has changed.   You have a feveror persistent symptoms for more than 2 3 days.  You have a fever and your symptoms suddenly get worse. MAKE SURE YOU:  Understand these instructions.  Will watch your condition.  Will get help right away if you are not doing well or get worse. Document Released: 02/04/2005 Document Revised: 10/30/2011 Document Reviewed: 09/19/2011 [facility] Patient Information 2014 [place], [place].

## 2012-10-26 DIAGNOSIS — B359 Dermatophytosis, unspecified: Secondary | ICD-10-CM | POA: Diagnosis not present

## 2012-10-26 DIAGNOSIS — L608 Other nail disorders: Secondary | ICD-10-CM | POA: Diagnosis not present

## 2012-10-26 DIAGNOSIS — L578 Other skin changes due to chronic exposure to nonionizing radiation: Secondary | ICD-10-CM | POA: Diagnosis not present

## 2012-10-26 DIAGNOSIS — L821 Other seborrheic keratosis: Secondary | ICD-10-CM | POA: Diagnosis not present

## 2012-10-26 DIAGNOSIS — L820 Inflamed seborrheic keratosis: Secondary | ICD-10-CM | POA: Diagnosis not present

## 2012-10-26 DIAGNOSIS — L570 Actinic keratosis: Secondary | ICD-10-CM | POA: Diagnosis not present

## 2012-10-26 DIAGNOSIS — Z85820 Personal history of malignant melanoma of skin: Secondary | ICD-10-CM | POA: Diagnosis not present

## 2012-10-26 DIAGNOSIS — Z85828 Personal history of other malignant neoplasm of skin: Secondary | ICD-10-CM | POA: Diagnosis not present

## 2012-11-11 ENCOUNTER — Telehealth: Payer: Self-pay | Admitting: *Deleted

## 2012-11-11 NOTE — Telephone Encounter (Signed)
Form for diabetic testing supplies in your IN box for completion. 

## 2012-11-16 NOTE — Telephone Encounter (Signed)
Signed and placed in my out box Lab Results  Component Value Date   HGBA1C 7.0* 03/19/2012

## 2012-11-23 ENCOUNTER — Ambulatory Visit (INDEPENDENT_AMBULATORY_CARE_PROVIDER_SITE_OTHER): Payer: Medicare Other

## 2012-11-23 DIAGNOSIS — Z23 Encounter for immunization: Secondary | ICD-10-CM | POA: Diagnosis not present

## 2012-12-04 ENCOUNTER — Encounter: Payer: Self-pay | Admitting: Cardiovascular Disease

## 2012-12-04 ENCOUNTER — Ambulatory Visit (INDEPENDENT_AMBULATORY_CARE_PROVIDER_SITE_OTHER): Payer: Medicare Other | Admitting: Cardiovascular Disease

## 2012-12-04 VITALS — BP 148/88 | HR 57 | Ht 67.5 in | Wt 206.8 lb

## 2012-12-04 DIAGNOSIS — E669 Obesity, unspecified: Secondary | ICD-10-CM | POA: Diagnosis not present

## 2012-12-04 DIAGNOSIS — I2510 Atherosclerotic heart disease of native coronary artery without angina pectoris: Secondary | ICD-10-CM | POA: Diagnosis not present

## 2012-12-04 DIAGNOSIS — Z951 Presence of aortocoronary bypass graft: Secondary | ICD-10-CM | POA: Diagnosis not present

## 2012-12-04 DIAGNOSIS — E119 Type 2 diabetes mellitus without complications: Secondary | ICD-10-CM

## 2012-12-04 DIAGNOSIS — I10 Essential (primary) hypertension: Secondary | ICD-10-CM | POA: Diagnosis not present

## 2012-12-04 DIAGNOSIS — E785 Hyperlipidemia, unspecified: Secondary | ICD-10-CM

## 2012-12-04 NOTE — Assessment & Plan Note (Signed)
Cholesterol is at goal on the current lipid regimen. No changes to the medications were made.  

## 2012-12-04 NOTE — Assessment & Plan Note (Signed)
Currently with no symptoms of angina. No further workup at this time. Continue current medication regimen. 

## 2012-12-04 NOTE — Progress Notes (Signed)
Patient ID: Aaron Stafford, male    DOB: May 24, 1934, 78 y.o.   MRN: [id number]  HPI Comments: Aaron Stafford is a very pleasant 78 year old gentleman with a history of coronary artery disease,  bypass surgery, With LIMA graft to the LAD, vein graft to the RCA, vein graft to the OM with previous stent placed to the vein graft to the RCA in June 2011, hyperlipidemia, hypertension who presents for routine follow up. 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    he has been doing well.  He was very active, less exercise in the past 2 months as he has been very busy . He denies having any significant chest pain or shortness of breath with exertion.  He does report having some swelling in his lower extremities at the end of the day, mild. He is on metoprolol tartrate to 12.5 mg b.i.d. and reports his fatigue is better .  He continues to be very active, working in [employer] and otherwise feels well.    EKG shows normal sinus rhythm with rate 57 beats per minute with interventricular conduction delay, left anterior fascicular block   Outpatient Encounter Prescriptions as of 12/04/2012  Medication Sig Dispense Refill  . amLODipine (NORVASC) 5 MG tablet Take 1 tablet (5 mg total) by mouth daily.  90 tablet  3  . aspirin 81 MG tablet Take 81 mg by mouth daily.        Marland Kitchen CINNAMON PO Take 2,000 mg by mouth daily.      . Cranberry 500 MG CAPS Take by mouth daily.      Marland Kitchen ezetimibe (ZETIA) 10 MG tablet Take 1 tablet (10 mg total) by mouth daily.  90 tablet  3  . finasteride (PROSCAR) 5 MG tablet Take 1 tablet (5 mg total) by mouth daily.  90 tablet  3  . furosemide (LASIX) 20 MG tablet Take 1 tablet (20 mg total) by mouth daily.  90 tablet  3  . hydrochlorothiazide (HYDRODIURIL) 25 MG tablet Take 1 tablet (25 mg total) by mouth daily.  90 tablet  3  . metFORMIN (GLUCOPHAGE) 500 MG tablet Take 1 tablet (500 mg total) by mouth 2 (two) times daily with a meal.  180 tablet  3  . metoprolol tartrate (LOPRESSOR) 25 MG tablet  Take 0.5 tablets (12.5 mg total) by mouth 2 (two) times daily.  90 tablet  3  . pravastatin (PRAVACHOL) 40 MG tablet Take 1 tablet (40 mg total) by mouth at bedtime.  90 tablet  3  . sildenafil (VIAGRA) 100 MG tablet Take 1 tablet (100 mg total) by mouth daily as needed.  10 tablet  5  . traZODone (DESYREL) 100 MG tablet Take 1 tablet (100 mg total) by mouth at bedtime.  90 tablet  3    Review of Systems  Constitutional: Negative.   HENT: Negative.   Eyes: Negative.   Respiratory: Negative.   Cardiovascular: Negative.   Gastrointestinal: Negative.   Endocrine: Negative.   Musculoskeletal: Negative.   Skin: Negative.   Allergic/Immunologic: Negative.   Neurological: Negative.   Hematological: Negative.   Psychiatric/Behavioral: Negative.   All other systems reviewed and are negative.    BP 148/88  Pulse 57  Ht 5' 7.5" (1.715 m)  Wt 206 lb 12 oz (93.781 kg)  BMI 31.89 kg/m2  Physical Exam  Nursing note and vitals reviewed. Constitutional: He is oriented to person, place, and time. He appears well-developed and well-nourished.  HENT:  Head:  Normocephalic.  Nose: Nose normal.  Mouth/Throat: Oropharynx is clear and moist.  Eyes: Conjunctivae are normal. Pupils are equal, round, and reactive to light.  Neck: Normal range of motion. Neck supple. No JVD present.  Cardiovascular: Normal rate, regular rhythm, S1 normal, S2 normal, normal heart sounds and intact distal pulses.  Exam reveals no gallop and no friction rub.   No murmur heard. Pulmonary/Chest: Effort normal and breath sounds normal. No respiratory distress. He has no wheezes. He has no rales. He exhibits no tenderness.  Abdominal: Soft. Bowel sounds are normal. He exhibits no distension. There is no tenderness.  Musculoskeletal: Normal range of motion. He exhibits no edema and no tenderness.  Lymphadenopathy:    He has no cervical adenopathy.  Neurological: He is alert and oriented to person, place, and time.  Coordination normal.  Skin: Skin is warm and dry. No rash noted. No erythema.  Psychiatric: He has a normal mood and affect. His behavior is normal. Judgment and thought content normal.      Assessment and Plan

## 2012-12-04 NOTE — Assessment & Plan Note (Signed)
We have encouraged continued exercise, careful diet management in an effort to lose weight. 

## 2012-12-04 NOTE — Assessment & Plan Note (Signed)
Stable, history of stent

## 2012-12-04 NOTE — Patient Instructions (Signed)
You are doing well. No medication changes were made.  Please call us if you have new issues that need to be addressed before your next appt.  Your physician wants you to follow-up in: 6 months.  You will receive a reminder letter in the mail two months in advance. If you don't receive a letter, please call our office to schedule the follow-up appointment.   

## 2012-12-04 NOTE — Assessment & Plan Note (Signed)
Hemoglobin A1c 7 in January 2014. Encouraged him to watch his diet, increase his exercise again

## 2012-12-04 NOTE — Assessment & Plan Note (Signed)
Blood pressure is well controlled on today's visit. No changes made to the medications. 

## 2012-12-24 ENCOUNTER — Other Ambulatory Visit: Payer: Self-pay

## 2013-01-19 ENCOUNTER — Telehealth: Payer: Self-pay | Admitting: *Deleted

## 2013-01-19 ENCOUNTER — Encounter: Payer: Self-pay | Admitting: Cardiology

## 2013-01-19 NOTE — Telephone Encounter (Signed)
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 Please call when ready.

## 2013-01-21 NOTE — Telephone Encounter (Signed)
Left detailed message that I will leave letter at front desk for pt to pick up tomorrow.

## 2013-02-10 ENCOUNTER — Ambulatory Visit (INDEPENDENT_AMBULATORY_CARE_PROVIDER_SITE_OTHER): Payer: Medicare Other | Admitting: Family Medicine

## 2013-02-10 ENCOUNTER — Encounter: Payer: Self-pay | Admitting: Family Medicine

## 2013-02-10 VITALS — BP 140/82 | HR 82 | Temp 98.4°F | Ht 67.5 in | Wt 205.5 lb

## 2013-02-10 DIAGNOSIS — J069 Acute upper respiratory infection, unspecified: Secondary | ICD-10-CM | POA: Diagnosis not present

## 2013-02-10 DIAGNOSIS — R0789 Other chest pain: Secondary | ICD-10-CM

## 2013-02-10 MED ORDER — AZITHROMYCIN 250 MG PO TABS: ORAL_TABLET | ORAL | Status: DC

## 2013-02-10 NOTE — Progress Notes (Signed)
Pre-visit discussion using our clinic review tool. No additional management support is needed unless otherwise documented below in the visit note.  

## 2013-02-10 NOTE — Patient Instructions (Signed)
Drink lots of fluids  Take the zithromax as directed  Try delsym or mucinex (plain) - for cough as needed  chlorcedin hbp is fine Acetaminophen is fine for pain or sore throat or fever  Update if not starting to improve in a week or if worsening

## 2013-02-10 NOTE — Progress Notes (Signed)
Subjective:    Patient ID: Aaron Stafford, male    DOB: May 14, 1934, 78 y.o.   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 Shingles rash 09/26/2012  . Tick bite of lower leg 06/03/2012  . BPH with obstruction/lower urinary tract symptoms   . Medicare annual wellness visit, subsequent 03/25/2011  . Abdominal discomfort 03/25/2011  . S/P CABG (coronary artery bypass graft) 06/07/2010  . HTN (hypertension)   . HLD (hyperlipidemia)   . Diabetes mellitus type II, controlled   . GERD (gastroesophageal reflux disease)   . Chronic rhinitis   . Unspecified vitamin D deficiency 04/11/2008  . INSOMNIA 03/14/2008  . INCONTINENCE, URGE 07/07/2006  . Obesity 07/03/2006  . ERECTILE DYSFUNCTION 07/01/2006  . CORONARY ARTERY DISEASE 07/01/2006  . IRRITABLE BOWEL SYNDROME 07/01/2006   Past Medical History  Diagnosis Date  . CAD (coronary artery disease)     with stents  . HTN (hypertension)   . HLD (hyperlipidemia)   . Diabetes mellitus type II   . GERD (gastroesophageal reflux disease)   . Chronic rhinitis     ENT [place]  . Ischemic heart disease   . MI (myocardial infarction) 1995  . History of BPH   . Fatty liver 06/2011    by CT  . Diverticulosis 06/2011    by CT and colonoscopy  . Shingles rash 09/26/2012  . History of shingles    Past Surgical History  Procedure Laterality Date  . Cholecystectomy  1985  . Coronary artery bypass  graft  05/1993    x6 MI Attemped angioplasty  . Inguinal hernia repair  11/2000    and ventral hernia repair  . Cystoscopy  10/03/08    Normal-Dr. Lonna Cobb  . Ankle surgery  03/2002    Left ankle fracture  . Carotid stent  07/2009    RCA stent  . Colonoscopy  03/2005    diverticulosis, tortuous colon with looping Marva Panda)   History  Substance Use Topics  . Smoking status: Never Smoker   . Smokeless tobacco: Never Used  . Alcohol Use: No   Family History  Problem Relation Age of Onset  . Kidney failure Mother   . Coronary artery disease Mother   . Heart failure Mother   . Hypertension Brother   . Heart attack Brother   . Alcohol abuse Brother   . Hypertension Brother   . Hypertension Sister    Allergies  Allergen Reactions  . Morphine Sulfate     REACTION: Hallucinations  . Nitroglycerin     REACTION: Decreased BP  . Contrast Media [Iodinated Diagnostic Agents] Rash    Severe hives   Current Outpatient Prescriptions on File Prior to Visit  Medication Sig Dispense Refill  . amLODipine (NORVASC) 5 MG tablet Take 1 tablet (  5 mg total) by mouth daily.  90 tablet  3  . aspirin 81 MG tablet Take 81 mg by mouth daily.        Marland Kitchen CINNAMON PO Take 2,000 mg by mouth daily.      . Cranberry 500 MG CAPS Take by mouth daily.      Marland Kitchen ezetimibe (ZETIA) 10 MG tablet Take 1 tablet (10 mg total) by mouth daily.  90 tablet  3  . finasteride (PROSCAR) 5 MG tablet Take 1 tablet (5 mg total) by mouth daily.  90 tablet  3  . furosemide (LASIX) 20 MG tablet Take 1 tablet (20 mg total) by mouth daily.  90 tablet  3  . hydrochlorothiazide (HYDRODIURIL) 25 MG tablet Take 1 tablet (25 mg total) by mouth daily.  90 tablet  3  . metFORMIN (GLUCOPHAGE) 500 MG tablet Take 1 tablet (500 mg total) by mouth 2 (two) times daily with a meal.  180 tablet  3  . metoprolol tartrate (LOPRESSOR) 25 MG tablet Take 0.5 tablets (12.5 mg total) by mouth 2 (two) times daily.  90 tablet  3  . pravastatin (PRAVACHOL) 40  MG tablet Take 1 tablet (40 mg total) by mouth at bedtime.  90 tablet  3  . sildenafil (VIAGRA) 100 MG tablet Take 1 tablet (100 mg total) by mouth daily as needed.  10 tablet  5  . traZODone (DESYREL) 100 MG tablet Take 1 tablet (100 mg total) by mouth at bedtime.  90 tablet  3   No current facility-administered medications on file prior to visit.    Review of Systems Review of Systems  Constitutional: Negative for fever, appetite change,  and unexpected weight change. pos for fatigue  ENT pos for congestion /rhinorrhea/st and hoarseness Eyes: Negative for pain and visual disturbance.  Respiratory: Negative for  shortness of breath.   Cardiovascular: Negative for cp or palpitations    Gastrointestinal: Negative for nausea, diarrhea and constipation.  Genitourinary: Negative for urgency and frequency.  Skin: Negative for pallor or rash   Neurological: Negative for weakness, light-headedness, numbness and headaches.  Hematological: Negative for adenopathy. Does not bruise/bleed easily.  Psychiatric/Behavioral: Negative for dysphoric mood. The patient is not nervous/anxious.         Objective:   Physical Exam  Constitutional: He appears well-developed and well-nourished. No distress.  obese and well appearing   HENT:  Head: Normocephalic and atraumatic.  Right Ear: External ear normal.  Left Ear: External ear normal.  Mouth/Throat: No oropharyngeal exudate.  Nares are injected and congested  No sinus tenderness Throat-clear post nasal drip   Eyes: Conjunctivae and EOM are normal. Pupils are equal, round, and reactive to light. Right eye exhibits no discharge. Left eye exhibits no discharge.  Neck: Normal range of motion. Neck supple.  Cardiovascular: Normal rate and regular rhythm.   Pulmonary/Chest: Effort normal and breath sounds normal. No respiratory distress. He has no wheezes. He has no rales. He exhibits no tenderness.  Few scattered rhonchi No crackles  Musculoskeletal:  He exhibits no edema.  Lymphadenopathy:    He has no cervical adenopathy.  Neurological: He is alert.  Skin: Skin is warm and dry. No rash noted.  Psychiatric: He has a normal mood and affect.          Assessment & Plan:

## 2013-02-11 NOTE — Assessment & Plan Note (Signed)
Hoarseness/ cough and some features of bronchitis Disc symptomatic care - see instructions on AVS Cover with zithromax if worse/not imp Update if not starting to improve in a week or if worsening

## 2013-03-17 ENCOUNTER — Telehealth: Payer: Self-pay | Admitting: *Deleted

## 2013-03-17 NOTE — Telephone Encounter (Signed)
If zetia is expensive, Would be okay to hold as total cholesterol well below goal less than 150 His total cholesterol is 120 Okay to take fish oil for elevated triglycerides Buy over-the-counter fish oil and take 2, up to 3 fish oil per day

## 2013-03-17 NOTE — Telephone Encounter (Signed)
Patient wants a medication change from Zetia to a fish oil. Please advise

## 2013-03-18 NOTE — Telephone Encounter (Signed)
Spoke w/ pt.  He is agreeable to this and will hold his zetia for now.

## 2013-04-26 DIAGNOSIS — Z85820 Personal history of malignant melanoma of skin: Secondary | ICD-10-CM | POA: Diagnosis not present

## 2013-04-26 DIAGNOSIS — L578 Other skin changes due to chronic exposure to nonionizing radiation: Secondary | ICD-10-CM | POA: Diagnosis not present

## 2013-04-26 DIAGNOSIS — L219 Seborrheic dermatitis, unspecified: Secondary | ICD-10-CM | POA: Diagnosis not present

## 2013-04-26 DIAGNOSIS — L570 Actinic keratosis: Secondary | ICD-10-CM | POA: Diagnosis not present

## 2013-04-26 DIAGNOSIS — Z85828 Personal history of other malignant neoplasm of skin: Secondary | ICD-10-CM | POA: Diagnosis not present

## 2013-06-02 ENCOUNTER — Ambulatory Visit (INDEPENDENT_AMBULATORY_CARE_PROVIDER_SITE_OTHER): Payer: Medicare Other | Admitting: Cardiovascular Disease

## 2013-06-02 ENCOUNTER — Encounter: Payer: Self-pay | Admitting: Cardiovascular Disease

## 2013-06-02 VITALS — BP 140/72 | HR 54 | Ht 69.0 in | Wt 198.2 lb

## 2013-06-02 DIAGNOSIS — I2510 Atherosclerotic heart disease of native coronary artery without angina pectoris: Secondary | ICD-10-CM

## 2013-06-02 DIAGNOSIS — I10 Essential (primary) hypertension: Secondary | ICD-10-CM

## 2013-06-02 DIAGNOSIS — E119 Type 2 diabetes mellitus without complications: Secondary | ICD-10-CM

## 2013-06-02 NOTE — Assessment & Plan Note (Signed)
Currently with no symptoms of angina. No further workup at this time. Continue current medication regimen. 

## 2013-06-02 NOTE — Patient Instructions (Signed)
You are doing well. No medication changes were made.  Please call us if you have new issues that need to be addressed before your next appt.  Your physician wants you to follow-up in: 6 months.  You will receive a reminder letter in the mail two months in advance. If you don't receive a letter, please call our office to schedule the follow-up appointment.   

## 2013-06-02 NOTE — Assessment & Plan Note (Signed)
Blood pressure is well controlled on today's visit. No changes made to the medications. 

## 2013-06-02 NOTE — Assessment & Plan Note (Signed)
We have encouraged continued exercise, careful diet management in an effort to lose weight. 

## 2013-06-02 NOTE — Progress Notes (Signed)
Patient ID: Aaron Stafford, male    DOB: 02-16-1935, 79 y.o.   MRN: [id number]  HPI Comments: Aaron Stafford is a very pleasant 80 year old gentleman with a history of coronary artery disease,  bypass surgery, With LIMA graft to the LAD, vein graft to the RCA, vein graft to the OM with previous stent placed to the vein graft to the RCA in June 2011, hyperlipidemia, hypertension who presents for routine follow up. 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    he has been doing well.  He continues  to work as a Theme park manager, very active at baseline.Marland Kitchen He denies having any significant chest pain or shortness of breath with exertion.   Mild swelling if he is on his feet for long periods of time He is on metoprolol tartrate to 12.5 mg b.i.d. and reports his fatigue is better .   EKG shows normal sinus rhythm with rate 54 beats per minute with interventricular conduction delay, left anterior fascicular block   Outpatient Encounter Prescriptions as of 06/02/2013  Medication Sig  . amLODipine (NORVASC) 5 MG tablet Take 1 tablet (5 mg total) by mouth daily.  Marland Kitchen aspirin 81 MG tablet Take 81 mg by mouth daily.    Marland Kitchen CINNAMON PO Take 2,000 mg by mouth daily.  . Cranberry 500 MG CAPS Take by mouth daily.  . finasteride (PROSCAR) 5 MG tablet Take 1 tablet (5 mg total) by mouth daily.  . furosemide (LASIX) 20 MG tablet Take 1 tablet (20 mg total) by mouth daily.  . hydrochlorothiazide (HYDRODIURIL) 25 MG tablet Take 1 tablet (25 mg total) by mouth daily.  . metFORMIN (GLUCOPHAGE) 500 MG tablet Take 1 tablet (500 mg total) by mouth 2 (two) times daily with a meal.  . metoprolol tartrate (LOPRESSOR) 25 MG tablet Take 0.5 tablets (12.5 mg total) by mouth 2 (two) times daily.  . pravastatin (PRAVACHOL) 40 MG tablet Take 1 tablet (40 mg total) by mouth at bedtime.  . traZODone (DESYREL) 100 MG tablet Take 1 tablet (100 mg total) by mouth at bedtime.  . vitamin A 7500 UNIT capsule Take 7,500 Units by mouth daily.    Review of  Systems  Constitutional: Negative.   HENT: Negative.   Eyes: Negative.   Respiratory: Negative.   Cardiovascular: Negative.   Gastrointestinal: Negative.   Endocrine: Negative.   Musculoskeletal: Negative.   Skin: Negative.   Allergic/Immunologic: Negative.   Neurological: Negative.   Hematological: Negative.   Psychiatric/Behavioral: Negative.   All other systems reviewed and are negative.   BP 140/72  Pulse 54  Ht 5\' 9"  (1.753 m)  Wt 198 lb 4 oz (89.926 kg)  BMI 29.26 kg/m2  Physical Exam  Nursing note and vitals reviewed. Constitutional: He is oriented to person, place, and time. He appears well-developed and well-nourished.  HENT:  Head: Normocephalic.  Nose: Nose normal.  Mouth/Throat: Oropharynx is clear and moist.  Eyes: Conjunctivae are normal. Pupils are equal, round, and reactive to light.  Neck: Normal range of motion. Neck supple. No JVD present.  Cardiovascular: Normal rate, regular rhythm, S1 normal, S2 normal, normal heart sounds and intact distal pulses.  Exam reveals no gallop and no friction rub.   No murmur heard. Pulmonary/Chest: Effort normal and breath sounds normal. No respiratory distress. He has no wheezes. He has no rales. He exhibits no tenderness.  Abdominal: Soft. Bowel sounds are normal. He exhibits no distension. There is no tenderness.  Musculoskeletal: Normal range of motion. He  exhibits no edema and no tenderness.  Lymphadenopathy:    He has no cervical adenopathy.  Neurological: He is alert and oriented to person, place, and time. Coordination normal.  Skin: Skin is warm and dry. No rash noted. No erythema.  Psychiatric: He has a normal mood and affect. His behavior is normal. Judgment and thought content normal.      Assessment and Plan

## 2013-06-30 ENCOUNTER — Other Ambulatory Visit: Payer: Self-pay | Admitting: Family Medicine

## 2013-07-12 LAB — HM DIABETES EYE EXAM

## 2013-07-16 ENCOUNTER — Encounter: Payer: Self-pay | Admitting: Family Medicine

## 2013-07-23 ENCOUNTER — Other Ambulatory Visit: Payer: Self-pay

## 2013-07-23 MED ORDER — AMLODIPINE BESYLATE 5 MG PO TABS: ORAL_TABLET | ORAL | Status: DC

## 2013-07-23 NOTE — Telephone Encounter (Signed)
[facility] left v/m requesting refill amlodipine. Last seen by Dr Darnell Level on 07/27/12. No future appts scheduled.Please advise.

## 2013-07-23 NOTE — Telephone Encounter (Signed)
[facility] calling for refill on amlodipine; [facility] said pt requested refill from [place] and not to send refill to [facility] as previously requested. Called [facility] to let them know if amlodipine is filled refill will go to [place]. Please advise.

## 2013-08-02 ENCOUNTER — Other Ambulatory Visit: Payer: Self-pay | Admitting: Family Medicine

## 2013-08-03 NOTE — Telephone Encounter (Signed)
Pt wanting to ck on metformin;pt is out of med; advised pt metformin is ready for pick up at [place].pt voiced understanding.

## 2013-08-05 ENCOUNTER — Other Ambulatory Visit (INDEPENDENT_AMBULATORY_CARE_PROVIDER_SITE_OTHER): Payer: Medicare Other

## 2013-08-05 ENCOUNTER — Other Ambulatory Visit: Payer: Self-pay | Admitting: Family Medicine

## 2013-08-05 DIAGNOSIS — N401 Enlarged prostate with lower urinary tract symptoms: Secondary | ICD-10-CM

## 2013-08-05 DIAGNOSIS — E785 Hyperlipidemia, unspecified: Secondary | ICD-10-CM

## 2013-08-05 DIAGNOSIS — N138 Other obstructive and reflux uropathy: Secondary | ICD-10-CM | POA: Diagnosis not present

## 2013-08-05 DIAGNOSIS — E119 Type 2 diabetes mellitus without complications: Secondary | ICD-10-CM | POA: Diagnosis not present

## 2013-08-05 DIAGNOSIS — I10 Essential (primary) hypertension: Secondary | ICD-10-CM

## 2013-08-05 DIAGNOSIS — E669 Obesity, unspecified: Secondary | ICD-10-CM

## 2013-08-05 LAB — LIPID PANEL
CHOLESTEROL: 146 mg/dL (ref 0–200)
HDL: 37.2 mg/dL — ABNORMAL LOW (ref >39.00)
LDL Cholesterol: 49 mg/dL (ref 0–99)
NonHDL: 108.8
Total CHOL/HDL Ratio: 4
Triglycerides: 298 mg/dL — ABNORMAL HIGH (ref 0.0–149.0)
VLDL: 59.6 mg/dL — ABNORMAL HIGH (ref 0.0–40.0)

## 2013-08-05 LAB — PSA: PSA: 0.06 ng/mL — ABNORMAL LOW (ref 0.10–4.00)

## 2013-08-05 LAB — HEMOGLOBIN A1C: Hgb A1c MFr Bld: 6.9 % — ABNORMAL HIGH (ref 4.6–6.5)

## 2013-08-05 LAB — BASIC METABOLIC PANEL
BUN: 17 mg/dL (ref 6–23)
CALCIUM: 9.5 mg/dL (ref 8.4–10.5)
CO2: 31 meq/L (ref 19–32)
CREATININE: 1 mg/dL (ref 0.4–1.5)
Chloride: 101 mEq/L (ref 96–112)
GFR: 80.63 mL/min (ref >60.00)
Glucose, Bld: 164 mg/dL — ABNORMAL HIGH (ref 70–99)
Potassium: 4.3 mEq/L (ref 3.5–5.1)
Sodium: 137 mEq/L (ref 135–145)

## 2013-08-06 ENCOUNTER — Telehealth: Payer: Self-pay | Admitting: Family Medicine

## 2013-08-06 NOTE — Telephone Encounter (Signed)
Relevant patient education assigned to patient using Emmi. ° °

## 2013-08-09 ENCOUNTER — Encounter: Payer: Self-pay | Admitting: Family Medicine

## 2013-08-09 ENCOUNTER — Ambulatory Visit (INDEPENDENT_AMBULATORY_CARE_PROVIDER_SITE_OTHER): Payer: Medicare Other | Admitting: Family Medicine

## 2013-08-09 VITALS — BP 140/80 | HR 68 | Temp 97.9°F | Ht 67.5 in | Wt 201.5 lb

## 2013-08-09 DIAGNOSIS — E119 Type 2 diabetes mellitus without complications: Secondary | ICD-10-CM | POA: Diagnosis not present

## 2013-08-09 DIAGNOSIS — E785 Hyperlipidemia, unspecified: Secondary | ICD-10-CM | POA: Diagnosis not present

## 2013-08-09 DIAGNOSIS — N138 Other obstructive and reflux uropathy: Secondary | ICD-10-CM | POA: Diagnosis not present

## 2013-08-09 DIAGNOSIS — Z733 Stress, not elsewhere classified: Secondary | ICD-10-CM

## 2013-08-09 DIAGNOSIS — I10 Essential (primary) hypertension: Secondary | ICD-10-CM | POA: Diagnosis not present

## 2013-08-09 DIAGNOSIS — Z0000 Encounter for general adult medical examination without abnormal findings: Secondary | ICD-10-CM | POA: Diagnosis not present

## 2013-08-09 DIAGNOSIS — Z23 Encounter for immunization: Secondary | ICD-10-CM

## 2013-08-09 DIAGNOSIS — I2510 Atherosclerotic heart disease of native coronary artery without angina pectoris: Secondary | ICD-10-CM | POA: Diagnosis not present

## 2013-08-09 DIAGNOSIS — E669 Obesity, unspecified: Secondary | ICD-10-CM

## 2013-08-09 DIAGNOSIS — F439 Reaction to severe stress, unspecified: Secondary | ICD-10-CM | POA: Insufficient documentation

## 2013-08-09 DIAGNOSIS — N401 Enlarged prostate with lower urinary tract symptoms: Secondary | ICD-10-CM | POA: Diagnosis not present

## 2013-08-09 NOTE — Assessment & Plan Note (Signed)
bp stable today - no changes indicated.

## 2013-08-09 NOTE — Assessment & Plan Note (Addendum)
Reviewed #s with patient. Encouraged increased aerobic exercise as well as weight loss to help control triglycerides. Fatty fish handout provided to help lower trig.

## 2013-08-09 NOTE — Progress Notes (Signed)
BP 140/80  Pulse 68  Temp(Src) 97.9 F (36.6 C) (Oral)  Ht 5' 7.5" (1.715 m)  Wt 201 lb 8 oz (91.4 kg)  BMI 31.08 kg/m2   CC: medicare wellness visit  Subjective:    Patient ID: Aaron Stafford, male    DOB: June 02, 1934, 79 y.o.   MRN: [id number]  HPI: Aaron Stafford is a 79 y.o. male presenting on 08/09/2013 for Annual Exam   Nocturia/voiding stable. Not very active currently - significant amt stress recently. Friend arrested this weekend. Trouble with sons intermittently. Significant work related stress Administrator, [employer]).  Wt Readings from Last 3 Encounters:  08/09/13 201 lb 8 oz (91.4 kg)  06/02/13 198 lb 4 oz (89.926 kg)  02/10/13 205 lb 8 oz (93.214 kg)  Body mass index is 31.08 kg/(m^2).  Denies depression/anhedonia. No falls in last year.  Passes hearing screens today.  Denies trouble with hearing. Recent vision screen by eye doctor (3 wks ago).  Preventative: Prostate - h/o BPH, takes finasteride. No h/o prostate cancer.  COLONOSCOPY Date: 03/2005 diverticulosis, tortuous colon with looping [name]) Last tetanus 2011 Flu shot 11/2012 Pneumovax 2006. prevnar - today. zostavax 2010 Advanced directives. wife/son are pt's HCPOA, advanced directive is at home. Has not brought copy here.  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  Allergies and medications reviewed and updated. Current Outpatient Prescriptions on File Prior to Visit  Medication Sig  . aspirin 81 MG tablet Take 81 mg by mouth daily.    Marland Kitchen CINNAMON PO Take 2,000 mg by mouth daily.  . Cranberry 500 MG CAPS Take by mouth daily.  . finasteride (PROSCAR) 5 MG tablet Take 1 tablet (5 mg total) by mouth daily.  . furosemide (LASIX) 20 MG tablet  Take 1 tablet (20 mg total) by mouth daily.  . hydrochlorothiazide (HYDRODIURIL) 25 MG tablet Take 1 tablet (25 mg total) by mouth daily.  . metFORMIN (GLUCOPHAGE) 500 MG tablet TAKE 1 TABLET (500 MG TOTAL) BY MOUTH 2 (TWO) TIMES DAILY WITH A MEAL.  . metoprolol tartrate (LOPRESSOR) 25 MG tablet TAKE 0.5 TABLETS (12.5 MG TOTAL) BY MOUTH 2 (TWO) TIMES DAILY.  . pravastatin (PRAVACHOL) 40 MG tablet Take 1 tablet (40 mg total) by mouth at bedtime.  . traZODone (DESYREL) 100 MG tablet Take 1 tablet (100 mg total) by mouth at bedtime.  . vitamin A 7500 UNIT capsule Take 7,500 Units by mouth daily.   No current facility-administered medications on file prior to visit.    Review of Systems Per HPI unless specifically indicated above    Objective:    BP 140/80  Pulse 68  Temp(Src) 97.9 F (36.6 C) (Oral)  Ht 5' 7.5" (1.715 m)  Wt 201 lb 8 oz (91.4 kg)  BMI 31.08 kg/m2  Physical Exam  Nursing note and vitals reviewed. Constitutional: He is oriented to person, place, and time. He appears well-developed and well-nourished. No distress.  HENT:  Head: Normocephalic and atraumatic.  Right Ear: Hearing, tympanic membrane, external ear and ear canal normal.  Left Ear: Hearing, tympanic membrane, external ear and ear canal normal.  Nose: Nose normal.  Mouth/Throat: Uvula is midline, oropharynx is clear and moist and mucous membranes are normal. No oropharyngeal exudate, posterior  oropharyngeal edema or posterior oropharyngeal erythema.  Eyes: Conjunctivae and EOM are normal. Pupils are equal, round, and reactive to light. No scleral icterus.  Neck: Normal range of motion. Neck supple. Carotid bruit is not present. No thyromegaly present.  Cardiovascular: Normal rate, regular rhythm, normal heart sounds and intact distal pulses.   No murmur heard. Pulses:      Radial pulses are 2+ on the right side, and 2+ on the left side.  Pulmonary/Chest: Effort normal and breath sounds normal. No respiratory  distress. He has no wheezes. He has no rales.  Abdominal: Soft. Bowel sounds are normal. He exhibits no distension and no mass. There is no tenderness. There is no rebound and no guarding.  Musculoskeletal: Normal range of motion. He exhibits no edema.  Lymphadenopathy:    He has no cervical adenopathy.  Neurological: He is alert and oriented to person, place, and time.  CN grossly intact, station and gait intact 3/3 recall 3/5 calculation, serial 3s [id number]  Skin: Skin is warm and dry. No rash noted.  Psychiatric: He has a normal mood and affect. His behavior is normal. Judgment and thought content normal.   Results for orders placed in visit on 08/05/13  LIPID PANEL      Result Value Ref Range   Cholesterol 146  0 - 200 mg/dL   Triglycerides 298.0 (*) 0.0 - 149.0 mg/dL   HDL 37.20 (*) >39.00 mg/dL   VLDL 59.6 (*) 0.0 - 40.0 mg/dL   LDL Cholesterol 49  0 - 99 mg/dL   Total CHOL/HDL Ratio 4     NonHDL 108.80    HEMOGLOBIN A1C      Result Value Ref Range   Hemoglobin A1C 6.9 (*) 4.6 - 6.5 %  PSA      Result Value Ref Range   PSA 0.06 (*) 0.10 - 4.00 ng/mL  BASIC METABOLIC PANEL      Result Value Ref Range   Sodium 137  135 - 145 mEq/L   Potassium 4.3  3.5 - 5.1 mEq/L   Chloride 101  96 - 112 mEq/L   CO2 31  19 - 32 mEq/L   Glucose, Bld 164 (*) 70 - 99 mg/dL   BUN 17  6 - 23 mg/dL   Creatinine, Ser 1.0  0.4 - 1.5 mg/dL   Calcium 9.5  8.4 - 10.5 mg/dL   GFR 80.63  >60.00 mL/min      Assessment & Plan:   Problem List Items Addressed This Visit   Stress     Reviewed current stressors as well as stress relieving strategies. Support provided.    Obesity     Encouraged increase in regular exercise activity.    Medicare annual wellness visit, subsequent - Primary     I have personally reviewed the Medicare Annual Wellness questionnaire and have noted 1. The patient's medical and social history 2. Their use of alcohol, tobacco or illicit drugs 3. Their current  medications and supplements 4. The patient's functional ability including ADL's, fall risks, home safety risks and hearing or visual impairment. 5. Diet and physical activity 6. Evidence for depression or mood disorders The patients weight, height, BMI have been recorded in the chart.  Hearing and vision has been addressed. I have made referrals, counseling and provided education to the patient based review of the above and I have provided the pt with a written personalized care plan for preventive services. See scanned questionairre. Advanced directives discussed: pt will bring me copy -  currently working on this with wife.  Reviewed preventative protocols and updated unless pt declined.    HTN (hypertension)     bp stable today - no changes indicated.    Relevant Medications      amLODipine (NORVASC) 5 MG tablet   HLD (hyperlipidemia)     Reviewed #s with patient. Encouraged increased aerobic exercise as well as weight loss to help control triglycerides. Fatty fish handout provided to help lower trig.    Relevant Medications      amLODipine (NORVASC) 5 MG tablet   Diabetes mellitus type II, controlled     Chronically controlled on metformin 500mg  bid. Continue current regimen.    CORONARY ARTERY DISEASE     Asxs. Continue current treatment regimen.    Relevant Medications      amLODipine (NORVASC) 5 MG tablet   BPH with obstruction/lower urinary tract symptoms     Chronic, stable. Taking only finasteride. Continue this.        Follow up plan: Return in about 6 months (around 02/08/2014), or as needed, for follow up visit.

## 2013-08-09 NOTE — Progress Notes (Signed)
Pre visit review using our clinic review tool, if applicable. No additional management support is needed unless otherwise documented below in the visit note. 

## 2013-08-09 NOTE — Assessment & Plan Note (Signed)
Asxs. Continue current treatment regimen.

## 2013-08-09 NOTE — Assessment & Plan Note (Signed)
Chronically controlled on metformin 500mg  bid. Continue current regimen.

## 2013-08-09 NOTE — Patient Instructions (Addendum)
Prevnar today. Bring me a copy of your living will. Good to see you today, call us with questions. Return as needed or in 6 months for follow up, fasting prior for blood work.

## 2013-08-09 NOTE — Addendum Note (Signed)
Addended by: Royann Shivers A on: 08/09/2013 12:55 PM   Modules accepted: Orders

## 2013-08-09 NOTE — Assessment & Plan Note (Signed)
Reviewed current stressors as well as stress relieving strategies. Support provided.

## 2013-08-09 NOTE — Assessment & Plan Note (Signed)
I have personally reviewed the Medicare Annual Wellness questionnaire and have noted 1. The patient's medical and social history 2. Their use of alcohol, tobacco or illicit drugs 3. Their current medications and supplements 4. The patient's functional ability including ADL's, fall risks, home safety risks and hearing or visual impairment. 5. Diet and physical activity 6. Evidence for depression or mood disorders The patients weight, height, BMI have been recorded in the chart.  Hearing and vision has been addressed. I have made referrals, counseling and provided education to the patient based review of the above and I have provided the pt with a written personalized care plan for preventive services. See scanned questionairre. Advanced directives discussed: pt will bring me copy - currently working on this with wife.  Reviewed preventative protocols and updated unless pt declined.

## 2013-08-09 NOTE — Assessment & Plan Note (Signed)
Encouraged increase in regular exercise activity.

## 2013-08-09 NOTE — Assessment & Plan Note (Signed)
Chronic, stable. Taking only finasteride. Continue this.

## 2013-08-25 ENCOUNTER — Other Ambulatory Visit: Payer: Self-pay | Admitting: Family Medicine

## 2013-10-21 ENCOUNTER — Other Ambulatory Visit: Payer: Self-pay | Admitting: Family Medicine

## 2013-10-25 ENCOUNTER — Other Ambulatory Visit: Payer: Self-pay | Admitting: Family Medicine

## 2013-10-28 DIAGNOSIS — Z1283 Encounter for screening for malignant neoplasm of skin: Secondary | ICD-10-CM | POA: Diagnosis not present

## 2013-10-28 DIAGNOSIS — Z85828 Personal history of other malignant neoplasm of skin: Secondary | ICD-10-CM | POA: Diagnosis not present

## 2013-10-28 DIAGNOSIS — R21 Rash and other nonspecific skin eruption: Secondary | ICD-10-CM | POA: Diagnosis not present

## 2013-10-28 DIAGNOSIS — Z85820 Personal history of malignant melanoma of skin: Secondary | ICD-10-CM | POA: Diagnosis not present

## 2013-10-28 DIAGNOSIS — L219 Seborrheic dermatitis, unspecified: Secondary | ICD-10-CM | POA: Diagnosis not present

## 2013-10-28 DIAGNOSIS — L538 Other specified erythematous conditions: Secondary | ICD-10-CM | POA: Diagnosis not present

## 2013-10-28 DIAGNOSIS — L820 Inflamed seborrheic keratosis: Secondary | ICD-10-CM | POA: Diagnosis not present

## 2013-10-28 DIAGNOSIS — L578 Other skin changes due to chronic exposure to nonionizing radiation: Secondary | ICD-10-CM | POA: Diagnosis not present

## 2013-11-03 ENCOUNTER — Other Ambulatory Visit: Payer: Self-pay

## 2013-11-03 MED ORDER — TRAZODONE HCL 100 MG PO TABS: 100.0000 mg | ORAL_TABLET | Freq: Every day | ORAL | Status: DC

## 2013-11-03 NOTE — Telephone Encounter (Signed)
Sent. Thanks.  Routed to PCP as FYI.  

## 2013-11-03 NOTE — Telephone Encounter (Signed)
Pt is at [facility] and request refill trazodone to [place].Please advise.

## 2013-12-09 DIAGNOSIS — I519 Heart disease, unspecified: Secondary | ICD-10-CM | POA: Diagnosis not present

## 2013-12-09 DIAGNOSIS — R070 Pain in throat: Secondary | ICD-10-CM | POA: Diagnosis not present

## 2013-12-09 DIAGNOSIS — J40 Bronchitis, not specified as acute or chronic: Secondary | ICD-10-CM | POA: Diagnosis not present

## 2013-12-27 ENCOUNTER — Other Ambulatory Visit: Payer: Self-pay | Admitting: Family Medicine

## 2014-01-31 ENCOUNTER — Ambulatory Visit (INDEPENDENT_AMBULATORY_CARE_PROVIDER_SITE_OTHER): Payer: Medicare Other | Admitting: Cardiovascular Disease

## 2014-01-31 ENCOUNTER — Encounter: Payer: Self-pay | Admitting: Cardiovascular Disease

## 2014-01-31 VITALS — BP 150/70 | HR 56 | Ht 69.0 in | Wt 196.5 lb

## 2014-01-31 DIAGNOSIS — I2510 Atherosclerotic heart disease of native coronary artery without angina pectoris: Secondary | ICD-10-CM | POA: Diagnosis not present

## 2014-01-31 DIAGNOSIS — E119 Type 2 diabetes mellitus without complications: Secondary | ICD-10-CM

## 2014-01-31 DIAGNOSIS — I10 Essential (primary) hypertension: Secondary | ICD-10-CM | POA: Diagnosis not present

## 2014-01-31 DIAGNOSIS — Z951 Presence of aortocoronary bypass graft: Secondary | ICD-10-CM

## 2014-01-31 NOTE — Assessment & Plan Note (Signed)
We have encouraged continued exercise, careful diet management in an effort to lose weight. 

## 2014-01-31 NOTE — Assessment & Plan Note (Signed)
Currently with no symptoms of angina. No further testing ordered at this time

## 2014-01-31 NOTE — Assessment & Plan Note (Addendum)
Blood pressure elevated today even on recheck. Recommended he closely monitor his blood pressure at home. If this runs high, would increase the amlodipine up to 10 mg daily

## 2014-01-31 NOTE — Assessment & Plan Note (Signed)
Currently with no symptoms of angina. No further workup at this time. Continue current medication regimen. 

## 2014-01-31 NOTE — Progress Notes (Signed)
Patient ID: Aaron Stafford, male    DOB: [birth year], 79 y.o.   MRN: [id number]  HPI Comments: Aaron Stafford is a very pleasant 79 year old gentleman with a history of coronary artery disease,  bypass surgery, With LIMA graft to the LAD, vein graft to the RCA, vein graft to the OM with previous stent placed to the vein graft to the RCA in June 2011, hyperlipidemia, hypertension who presents for routine follow up of his coronary artery disease. 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   At baseline, he is very active. He walks long distances typically daily. He denies any significant chest pain or shortness of breath with exertion. No significant leg swelling. Doing well on his current medication regimen. Has not been checking his blood pressure at home. Elevated on today's visit. Our office visits was reasonably well controlled  EKG shows normal sinus rhythm with rate 56 beats per minute with interventricular conduction delay, left anterior fascicular block, nonspecific ST abnormality   Allergies  Allergen Reactions  . Morphine Sulfate     REACTION: Hallucinations  . Nitroglycerin     REACTION: Decreased BP  . Contrast Media [Iodinated Diagnostic Agents] Rash    Severe hives    Outpatient Encounter Prescriptions as of 01/31/2014  Medication Sig  . amLODipine (NORVASC) 5 MG tablet Take one tablet daily  . aspirin 81 MG tablet Take 81 mg by mouth daily.    Marland Kitchen CINNAMON PO Take 2,000 mg by mouth daily.  . Cranberry 500 MG CAPS Take by mouth daily.  . finasteride (PROSCAR) 5 MG tablet TAKE 1 TABLET (5 MG TOTAL) BY MOUTH DAILY.  . furosemide (LASIX) 20 MG tablet TAKE 1 TABLET (20 MG TOTAL) BY MOUTH DAILY.  . hydrochlorothiazide (HYDRODIURIL) 25 MG tablet TAKE 1 TABLET (25 MG TOTAL) BY MOUTH DAILY.  . metFORMIN (GLUCOPHAGE) 500 MG tablet TAKE 1 TABLET (500 MG TOTAL) BY MOUTH 2 (TWO) TIMES DAILY WITH A MEAL.  . metoprolol tartrate (LOPRESSOR) 25 MG tablet TAKE 0.5 TABLETS (12.5 MG TOTAL) BY MOUTH 2  (TWO) TIMES DAILY.  . pravastatin (PRAVACHOL) 40 MG tablet TAKE 1 TABLET (40 MG TOTAL) BY MOUTH AT BEDTIME.  . traZODone (DESYREL) 100 MG tablet Take 1 tablet (100 mg total) by mouth at bedtime.  . vitamin A 7500 UNIT capsule Take 7,500 Units by mouth daily.  . vitamin B-12 (CYANOCOBALAMIN) 500 MCG tablet Take 1,000 mcg by mouth daily.  . [DISCONTINUED] amLODipine (NORVASC) 5 MG tablet Take one tablet daily (Patient not taking: Reported on 01/31/2014)    Past Medical History  Diagnosis Date  . CAD (coronary artery disease)     with stents  . HTN (hypertension)   . HLD (hyperlipidemia)   . Diabetes mellitus type II   . GERD (gastroesophageal reflux disease)   . Chronic rhinitis     [facility]  . Ischemic heart disease   . MI (myocardial infarction) 1995  . History of BPH   . Fatty liver 06/2011    by CT  . Diverticulosis 06/2011    by CT and colonoscopy  . Shingles rash 09/26/2012  . History of shingles     Past Surgical History  Procedure Laterality Date  . Cholecystectomy  1985  . Coronary artery bypass graft  05/1993    x6 MI Attemped angioplasty  . Inguinal hernia repair  11/2000    and ventral hernia repair  . Cystoscopy  10/03/08    Normal-Dr. Bernardo Heater  . Ankle surgery  03/2002    Left ankle fracture  . Carotid stent  07/2009    RCA stent  . Colonoscopy  03/2005    diverticulosis, tortuous colon with looping [name])    Social History  reports that he has never smoked. He has never used smokeless tobacco. He reports that he does not drink alcohol or use illicit drugs.  Family History family history includes Alcohol abuse in his brother; Coronary artery disease in his mother; Heart attack in his brother; Heart failure in his mother; Hypertension in his brother, brother, and sister; Kidney failure in his mother.      Review of Systems  Constitutional: Negative.   Respiratory: Negative.   Cardiovascular: Negative.   Gastrointestinal: Negative.    Musculoskeletal: Negative.   Neurological: Negative.   Hematological: Negative.   Psychiatric/Behavioral: Negative.   All other systems reviewed and are negative.   BP 150/70 mmHg  Pulse 56  Ht 5\' 9"  (1.753 m)  Wt 196 lb 8 oz (89.132 kg)  BMI 29.00 kg/m2  Physical Exam  Constitutional: He is oriented to person, place, and time. He appears well-developed and well-nourished.  HENT:  Head: Normocephalic.  Nose: Nose normal.  Mouth/Throat: Oropharynx is clear and moist.  Eyes: Conjunctivae are normal. Pupils are equal, round, and reactive to light.  Neck: Normal range of motion. Neck supple. No JVD present.  Cardiovascular: Normal rate, regular rhythm, S1 normal, S2 normal, normal heart sounds and intact distal pulses.  Exam reveals no gallop and no friction rub.   No murmur heard. Pulmonary/Chest: Effort normal and breath sounds normal. No respiratory distress. He has no wheezes. He has no rales. He exhibits no tenderness.  Abdominal: Soft. Bowel sounds are normal. He exhibits no distension. There is no tenderness.  Musculoskeletal: Normal range of motion. He exhibits no edema or tenderness.  Lymphadenopathy:    He has no cervical adenopathy.  Neurological: He is alert and oriented to person, place, and time. Coordination normal.  Skin: Skin is warm and dry. No rash noted. No erythema.  Psychiatric: He has a normal mood and affect. His behavior is normal. Judgment and thought content normal.      Assessment and Plan   Nursing note and vitals reviewed.

## 2014-01-31 NOTE — Patient Instructions (Signed)
You are doing well. No medication changes were made.  If blood pressure continues to run in the high 140s to  150 range Call the office, We would increase the amlodipine up to 10 mg daily  Please call us if you have new issues that need to be addressed before your next appt.  Your physician wants you to follow-up in: 6 months.  You will receive a reminder letter in the mail two months in advance. If you don't receive a letter, please call our office to schedule the follow-up appointment.

## 2014-02-08 ENCOUNTER — Other Ambulatory Visit: Payer: Self-pay

## 2014-02-08 NOTE — Telephone Encounter (Signed)
[facility] called requesting refill trazodone.Please advise. Pt last seen 08/09/13 and last trazodone refill done 11/03/13.

## 2014-02-09 ENCOUNTER — Ambulatory Visit: Payer: Medicare Other | Admitting: Family Medicine

## 2014-02-09 MED ORDER — TRAZODONE HCL 100 MG PO TABS: 100.0000 mg | ORAL_TABLET | Freq: Every day | ORAL | Status: DC

## 2014-02-09 NOTE — Telephone Encounter (Signed)
Sent!

## 2014-02-15 ENCOUNTER — Encounter: Payer: Self-pay | Admitting: Family Medicine

## 2014-02-15 ENCOUNTER — Ambulatory Visit (INDEPENDENT_AMBULATORY_CARE_PROVIDER_SITE_OTHER): Payer: Medicare Other | Admitting: Family Medicine

## 2014-02-15 VITALS — BP 140/76 | HR 64 | Temp 97.6°F | Wt 196.5 lb

## 2014-02-15 DIAGNOSIS — Z23 Encounter for immunization: Secondary | ICD-10-CM

## 2014-02-15 DIAGNOSIS — I2510 Atherosclerotic heart disease of native coronary artery without angina pectoris: Secondary | ICD-10-CM

## 2014-02-15 DIAGNOSIS — M6530 Trigger finger, unspecified finger: Secondary | ICD-10-CM

## 2014-02-15 DIAGNOSIS — R202 Paresthesia of skin: Secondary | ICD-10-CM | POA: Diagnosis not present

## 2014-02-15 DIAGNOSIS — E1140 Type 2 diabetes mellitus with diabetic neuropathy, unspecified: Secondary | ICD-10-CM | POA: Diagnosis not present

## 2014-02-15 DIAGNOSIS — E669 Obesity, unspecified: Secondary | ICD-10-CM | POA: Diagnosis not present

## 2014-02-15 DIAGNOSIS — E785 Hyperlipidemia, unspecified: Secondary | ICD-10-CM | POA: Diagnosis not present

## 2014-02-15 DIAGNOSIS — I10 Essential (primary) hypertension: Secondary | ICD-10-CM | POA: Diagnosis not present

## 2014-02-15 DIAGNOSIS — Z951 Presence of aortocoronary bypass graft: Secondary | ICD-10-CM | POA: Diagnosis not present

## 2014-02-15 DIAGNOSIS — E1142 Type 2 diabetes mellitus with diabetic polyneuropathy: Secondary | ICD-10-CM

## 2014-02-15 MED ORDER — LOSARTAN POTASSIUM 25 MG PO TABS: 25.0000 mg | ORAL_TABLET | Freq: Every day | ORAL | Status: DC

## 2014-02-15 NOTE — Progress Notes (Signed)
Pre visit review using our clinic review tool, if applicable. No additional management support is needed unless otherwise documented below in the visit note. 

## 2014-02-15 NOTE — Assessment & Plan Note (Signed)
Encouraged weight loss through healthy diet choices. 

## 2014-02-15 NOTE — Addendum Note (Signed)
Addended by: Royann Shivers A on: 02/15/2014 09:02 AM   Modules accepted: Orders

## 2014-02-15 NOTE — Progress Notes (Addendum)
BP 140/76 mmHg  Pulse 64  Temp(Src) 97.6 F (36.4 C) (Oral)  Wt 196 lb 8 oz (89.132 kg)   CC: 6 mo f/u visit  Subjective:    Patient ID: Aaron Stafford, male    DOB: [birth year], 79 y.o.   MRN: [id number]  HPI: Aaron Stafford is a 79 y.o. male presenting on 02/15/2014 for Follow-up   DM - regularly does not check sugars, highest 215.  Compliant with antihyperglycemic regimen which includes: metformin 500mg  bid.  Denies low sugars or hypoglycemic symptoms.  + paresthesias of feet over last few months. Last diabetic eye exam 06/2013.  Pneumovax: 2006.  Prevnar: 07/2013. Possible dysphagia to ACEI in past. Not regular with B12 supplementation. Denies EtOH use. Lab Results  Component Value Date   HGBA1C 6.9* 08/05/2013   Diabetic Foot Exam - Simple   Simple Foot Form  Diabetic Foot exam was performed with the following findings:  Yes 02/15/2014  8:29 AM  Visual Inspection  No deformities, no ulcerations, no other skin breakdown bilaterally:  Yes  Sensation Testing  Intact to touch and monofilament testing bilaterally:  Yes  Pulse Check  Posterior Tibialis and Dorsalis pulse intact bilaterally:  Yes  Comments  Onychomycosis bilaterally     HTN - Compliant with current antihypertensive regimen of amlodipine 5mg  daily, lasix 20mg  daily, hctz 25mg  daily, and metoprolol 12.5mg  bid.  Does check blood pressures at home: running well per patient. No low blood pressure readings or symptoms of dizziness/syncope. Denies HA, vision changes, CP/tightness, SOB, leg swelling.  HLD - compliant with pravastatin without myalgias. Lab Results  Component Value Date   LDLCALC 49 08/05/2013    L ring trigger finger present for last year. H/o same  Denies chest pain, pedal edema, dyspnea. Walks regularly.  Relevant past medical, surgical, family and social history reviewed and updated as indicated. Interim medical history since our last visit reviewed. Allergies and medications reviewed and  updated.  Current Outpatient Prescriptions on File Prior to Visit  Medication Sig  . amLODipine (NORVASC) 5 MG tablet Take one tablet daily  . aspirin 81 MG tablet Take 81 mg by mouth daily.    Marland Kitchen CINNAMON PO Take 2,000 mg by mouth daily.  . Cranberry 500 MG CAPS Take by mouth daily.  . finasteride (PROSCAR) 5 MG tablet TAKE 1 TABLET (5 MG TOTAL) BY MOUTH DAILY.  . furosemide (LASIX) 20 MG tablet TAKE 1 TABLET (20 MG TOTAL) BY MOUTH DAILY.  . metFORMIN (GLUCOPHAGE) 500 MG tablet TAKE 1 TABLET (500 MG TOTAL) BY MOUTH 2 (TWO) TIMES DAILY WITH A MEAL.  . metoprolol tartrate (LOPRESSOR) 25 MG tablet TAKE 0.5 TABLETS (12.5 MG TOTAL) BY MOUTH 2 (TWO) TIMES DAILY.  . pravastatin (PRAVACHOL) 40 MG tablet TAKE 1 TABLET (40 MG TOTAL) BY MOUTH AT BEDTIME.  . traZODone (DESYREL) 100 MG tablet Take 1 tablet (100 mg total) by mouth at bedtime.  . vitamin A 7500 UNIT capsule Take 7,500 Units by mouth daily.  . vitamin B-12 (CYANOCOBALAMIN) 500 MCG tablet Take 1,000 mcg by mouth daily.   No current facility-administered medications on file prior to visit.    Review of Systems Per HPI unless specifically indicated above     Objective:    BP 140/76 mmHg  Pulse 64  Temp(Src) 97.6 F (36.4 C) (Oral)  Wt 196 lb 8 oz (89.132 kg)  Wt Readings from Last 3 Encounters:  02/15/14 196 lb 8 oz (89.132 kg)  01/31/14 196 lb 8  oz (89.132 kg)  08/09/13 201 lb 8 oz (91.4 kg)    Physical Exam  Constitutional: He appears well-developed and well-nourished. No distress.  HENT:  Head: Normocephalic and atraumatic.  Right Ear: External ear normal.  Left Ear: External ear normal.  Nose: Nose normal.  Mouth/Throat: Oropharynx is clear and moist. No oropharyngeal exudate.  Eyes: Conjunctivae and EOM are normal. Pupils are equal, round, and reactive to light. No scleral icterus.  Neck: Normal range of motion. Neck supple.  Cardiovascular: Normal rate, regular rhythm, normal heart sounds and intact distal pulses.     No murmur heard. Pulmonary/Chest: Effort normal and breath sounds normal. No respiratory distress. He has no wheezes. He has no rales.  Musculoskeletal: He exhibits no edema.  See HPI for foot exam if done L ring finger trigger finger  Lymphadenopathy:    He has no cervical adenopathy.  Skin: Skin is warm and dry. No rash noted.  Psychiatric: He has a normal mood and affect.  Nursing note and vitals reviewed.  Results for orders placed or performed in visit on 08/05/13  Lipid panel  Result Value Ref Range   Cholesterol 146 0 - 200 mg/dL   Triglycerides 298.0 (H) 0.0 - 149.0 mg/dL   HDL 37.20 (L) >39.00 mg/dL   VLDL 59.6 (H) 0.0 - 40.0 mg/dL   LDL Cholesterol 49 0 - 99 mg/dL   Total CHOL/HDL Ratio 4    NonHDL 108.80   Hemoglobin A1c  Result Value Ref Range   Hgb A1c MFr Bld 6.9 (H) 4.6 - 6.5 %  PSA  Result Value Ref Range   PSA 0.06 (L) 0.10 - 4.00 ng/mL  Basic metabolic panel  Result Value Ref Range   Sodium 137 135 - 145 mEq/L   Potassium 4.3 3.5 - 5.1 mEq/L   Chloride 101 96 - 112 mEq/L   CO2 31 19 - 32 mEq/L   Glucose, Bld 164 (H) 70 - 99 mg/dL   BUN 17 6 - 23 mg/dL   Creatinine, Ser 1.0 0.4 - 1.5 mg/dL   Calcium 9.5 8.4 - 10.5 mg/dL   GFR 80.63 >60.00 mL/min      Assessment & Plan:   Problem List Items Addressed This Visit    Type 2 diabetes, controlled, with peripheral neuropathy - Primary    Chronic, stable. Continue metformin regimen. Foot exam today. Check labs in 1 week. UTD pneumonia shots and eye exam. Check B12 and if normal, will start gabapentin for diabetic neuropathy.    Relevant Medications      losartan (COZAAR) tablet   Other Relevant Orders      Hemoglobin A1c      Basic metabolic panel      Microalbumin / creatinine urine ratio   Trigger finger, acquired    Declines hand referral currently    S/P CABG (coronary artery bypass graft)    asxs.    Obesity    Encouraged weight loss through healthy diet choices.    HTN (hypertension)     Chronic, stable. Stop hctz as already on lasix, start losartan 25mg  daily. Monitor bp and update me if persistently elevated for change in regimen.    Relevant Medications      losartan (COZAAR) tablet   Other Relevant Orders      Basic metabolic panel   HLD (hyperlipidemia)    Continue pravastatin. LDL at goal.    Relevant Medications      losartan (COZAAR) tablet    Other Visit Diagnoses  Paresthesias        Relevant Orders       Vitamin B12        Follow up plan: Return in about 6 months (around 08/17/2014), or as needed, for medicare wellness.

## 2014-02-15 NOTE — Assessment & Plan Note (Signed)
Declines hand referral currently

## 2014-02-15 NOTE — Assessment & Plan Note (Signed)
Continue pravastatin. LDL at goal.  

## 2014-02-15 NOTE — Assessment & Plan Note (Addendum)
Chronic, stable. Continue metformin regimen. Foot exam today. Check labs in 1 week. UTD pneumonia shots and eye exam. Check B12 and if normal, will start gabapentin for diabetic neuropathy.

## 2014-02-15 NOTE — Patient Instructions (Addendum)
Flu shot today. Return in 6 mo for medicare wellness visit Stop hydrochlorothiazide, start losartan 25mg  daily for blood pressure and diabetes. Return in 1 week for fasting labs. We will also check b12 level for pain in feet - if normal, I will recommend starting gabapentin for this pain. Continue working on sugars.  Monitor blood pressure at home and let me know if persistently >140/90.

## 2014-02-15 NOTE — Assessment & Plan Note (Signed)
Chronic, stable. Stop hctz as already on lasix, start losartan 25mg  daily. Monitor bp and update me if persistently elevated for change in regimen.

## 2014-02-15 NOTE — Assessment & Plan Note (Signed)
asxs. 

## 2014-02-16 ENCOUNTER — Telehealth: Payer: Self-pay | Admitting: Family Medicine

## 2014-02-16 NOTE — Telephone Encounter (Signed)
emmi emailed °

## 2014-02-21 ENCOUNTER — Other Ambulatory Visit (INDEPENDENT_AMBULATORY_CARE_PROVIDER_SITE_OTHER): Payer: Medicare Other

## 2014-02-21 DIAGNOSIS — E1140 Type 2 diabetes mellitus with diabetic neuropathy, unspecified: Secondary | ICD-10-CM | POA: Diagnosis not present

## 2014-02-21 DIAGNOSIS — R202 Paresthesia of skin: Secondary | ICD-10-CM

## 2014-02-21 DIAGNOSIS — I10 Essential (primary) hypertension: Secondary | ICD-10-CM | POA: Diagnosis not present

## 2014-02-21 DIAGNOSIS — E1142 Type 2 diabetes mellitus with diabetic polyneuropathy: Secondary | ICD-10-CM

## 2014-02-21 LAB — BASIC METABOLIC PANEL
BUN: 16 mg/dL (ref 6–23)
CALCIUM: 9.2 mg/dL (ref 8.4–10.5)
CO2: 27 meq/L (ref 19–32)
CREATININE: 0.9 mg/dL (ref 0.4–1.5)
Chloride: 103 mEq/L (ref 96–112)
GFR: 82.49 mL/min (ref >60.00)
Glucose, Bld: 131 mg/dL — ABNORMAL HIGH (ref 70–99)
Potassium: 4 mEq/L (ref 3.5–5.1)
SODIUM: 138 meq/L (ref 135–145)

## 2014-02-21 LAB — VITAMIN B12: Vitamin B-12: 605 pg/mL (ref 211–911)

## 2014-02-21 LAB — HEMOGLOBIN A1C: Hgb A1c MFr Bld: 6.8 % — ABNORMAL HIGH (ref 4.6–6.5)

## 2014-02-21 LAB — MICROALBUMIN / CREATININE URINE RATIO
Creatinine,U: 132.1 mg/dL
Microalb Creat Ratio: 2.6 mg/g (ref 0.0–30.0)
Microalb, Ur: 3.4 mg/dL — ABNORMAL HIGH (ref 0.0–1.9)

## 2014-02-28 ENCOUNTER — Encounter: Payer: Self-pay | Admitting: *Deleted

## 2014-03-20 ENCOUNTER — Other Ambulatory Visit: Payer: Self-pay | Admitting: Family Medicine

## 2014-03-23 ENCOUNTER — Other Ambulatory Visit: Payer: Self-pay | Admitting: *Deleted

## 2014-03-23 MED ORDER — FINASTERIDE 5 MG PO TABS: ORAL_TABLET | ORAL | Status: DC

## 2014-03-31 ENCOUNTER — Other Ambulatory Visit: Payer: Self-pay | Admitting: Family Medicine

## 2014-04-02 ENCOUNTER — Other Ambulatory Visit: Payer: Self-pay | Admitting: Family Medicine

## 2014-04-20 ENCOUNTER — Other Ambulatory Visit: Payer: Self-pay | Admitting: Family Medicine

## 2014-05-03 ENCOUNTER — Telehealth: Payer: Self-pay | Admitting: Family Medicine

## 2014-05-03 NOTE — Telephone Encounter (Signed)
Pt is getting a bill and his insurance company told him that they would waive the bill if he got a letter from his PCP stating he has cardiovascular disease.  He needs it to be added that he has had surgery but no dates must be included for the surgeries. Please call pt with questions and/or when it is ready to pick up

## 2014-05-04 ENCOUNTER — Other Ambulatory Visit: Payer: Self-pay | Admitting: Family Medicine

## 2014-05-04 NOTE — Telephone Encounter (Signed)
Patient notified and placed up front for pick up.

## 2014-05-04 NOTE — Telephone Encounter (Signed)
Printed and in Kim's box 

## 2014-05-04 NOTE — Telephone Encounter (Signed)
Ok to refill 

## 2014-05-11 DIAGNOSIS — L821 Other seborrheic keratosis: Secondary | ICD-10-CM | POA: Diagnosis not present

## 2014-05-11 DIAGNOSIS — Z85820 Personal history of malignant melanoma of skin: Secondary | ICD-10-CM | POA: Diagnosis not present

## 2014-05-11 DIAGNOSIS — Z85828 Personal history of other malignant neoplasm of skin: Secondary | ICD-10-CM | POA: Diagnosis not present

## 2014-05-11 DIAGNOSIS — L820 Inflamed seborrheic keratosis: Secondary | ICD-10-CM | POA: Diagnosis not present

## 2014-05-11 DIAGNOSIS — L578 Other skin changes due to chronic exposure to nonionizing radiation: Secondary | ICD-10-CM | POA: Diagnosis not present

## 2014-06-26 ENCOUNTER — Encounter: Payer: Self-pay | Admitting: Family Medicine

## 2014-06-26 DIAGNOSIS — Z7189 Other specified counseling: Secondary | ICD-10-CM | POA: Insufficient documentation

## 2014-06-29 DIAGNOSIS — E119 Type 2 diabetes mellitus without complications: Secondary | ICD-10-CM | POA: Diagnosis not present

## 2014-06-30 LAB — HM DIABETES EYE EXAM

## 2014-07-01 ENCOUNTER — Encounter: Payer: Self-pay | Admitting: Family Medicine

## 2014-08-01 ENCOUNTER — Other Ambulatory Visit: Payer: Self-pay | Admitting: Family Medicine

## 2014-09-04 ENCOUNTER — Ambulatory Visit
Admission: EM | Admit: 2014-09-04 | Discharge: 2014-09-04 | Disposition: A | Discharge status: Home / Self Care | Payer: Medicare Other | Attending: Internal Medicine | Admitting: Internal Medicine

## 2014-09-04 DIAGNOSIS — J029 Acute pharyngitis, unspecified: Secondary | ICD-10-CM | POA: Diagnosis present

## 2014-09-04 DIAGNOSIS — H6983 Other specified disorders of Eustachian tube, bilateral: Secondary | ICD-10-CM | POA: Diagnosis not present

## 2014-09-04 DIAGNOSIS — R05 Cough: Secondary | ICD-10-CM | POA: Insufficient documentation

## 2014-09-04 DIAGNOSIS — R52 Pain, unspecified: Secondary | ICD-10-CM | POA: Diagnosis present

## 2014-09-04 DIAGNOSIS — J40 Bronchitis, not specified as acute or chronic: Secondary | ICD-10-CM

## 2014-09-04 DIAGNOSIS — D649 Anemia, unspecified: Secondary | ICD-10-CM | POA: Insufficient documentation

## 2014-09-04 DIAGNOSIS — I259 Chronic ischemic heart disease, unspecified: Secondary | ICD-10-CM | POA: Insufficient documentation

## 2014-09-04 DIAGNOSIS — E119 Type 2 diabetes mellitus without complications: Secondary | ICD-10-CM | POA: Insufficient documentation

## 2014-09-04 DIAGNOSIS — Z7982 Long term (current) use of aspirin: Secondary | ICD-10-CM | POA: Insufficient documentation

## 2014-09-04 DIAGNOSIS — I10 Essential (primary) hypertension: Secondary | ICD-10-CM | POA: Insufficient documentation

## 2014-09-04 DIAGNOSIS — R059 Cough, unspecified: Secondary | ICD-10-CM

## 2014-09-04 DIAGNOSIS — J302 Other seasonal allergic rhinitis: Secondary | ICD-10-CM

## 2014-09-04 DIAGNOSIS — I252 Old myocardial infarction: Secondary | ICD-10-CM | POA: Diagnosis not present

## 2014-09-04 DIAGNOSIS — J310 Chronic rhinitis: Secondary | ICD-10-CM | POA: Insufficient documentation

## 2014-09-04 LAB — RAPID STREP SCREEN (MED CTR MEBANE ONLY): Streptococcus, Group A Screen (Direct): NEGATIVE

## 2014-09-04 MED ORDER — AZITHROMYCIN 250 MG PO TABS: 250.0000 mg | ORAL_TABLET | Freq: Every day | ORAL | Status: DC

## 2014-09-04 MED ORDER — FLUTICASONE PROPIONATE 50 MCG/ACT NA SUSP: 2.0000 | Freq: Every day | NASAL | Status: DC

## 2014-09-04 MED ORDER — BENZONATATE 100 MG PO CAPS: 100.0000 mg | ORAL_CAPSULE | Freq: Three times a day (TID) | ORAL | Status: DC | PRN

## 2014-09-04 MED ORDER — IPRATROPIUM-ALBUTEROL 0.5-2.5 (3) MG/3ML IN SOLN
3.0000 mL | Freq: Once | RESPIRATORY_TRACT | Status: AC
  Administered 2014-09-04: 3 mL via RESPIRATORY_TRACT

## 2014-09-04 NOTE — ED Notes (Signed)
Patient started with sore throat this week, thought he was gettting better, sore throat back, with chest congestion, cough and body aches.

## 2014-09-04 NOTE — Discharge Instructions (Signed)
Seasonal allergies that are being under treated resulting in post nasal drip, sinus pressure,cough, ...  Rx with lots of water to drink -steamy showers- add Robitussin DM 1 -2 tsp every 4 hours to liquefy mucous and reduce cough.  Add Flonase 2 spray AM and 2 sprays PM to open up sinuses and encourage drainage-also to decrease the body reaction to the pollen.  With the heavy discharge and cough will add a Zpak which is antibiotic coverage  Because you have so much public exposure and must visit the  ill--  Also add Benzonatate for cough Start Zyrtec 1 tablet daily...stay on it until [place] has a hard freeze.!!    Acute Bronchitis Bronchitis is inflammation of the airways that extend from the windpipe into the lungs (bronchi). The inflammation often causes mucus to develop. This leads to a cough, which is the most common symptom of bronchitis.  In acute bronchitis, the condition usually develops suddenly and goes away over time, usually in a couple weeks. Smoking, allergies, and asthma can make bronchitis worse. Repeated episodes of bronchitis may cause further lung problems.  CAUSES Acute bronchitis is most often caused by the same virus that causes a cold. The virus can spread from person to person (contagious) through coughing, sneezing, and touching contaminated objects. SIGNS AND SYMPTOMS   Cough.   Fever.   Coughing up mucus.   Body aches.   Chest congestion.   Chills.   Shortness of breath.   Sore throat.  DIAGNOSIS  Acute bronchitis is usually diagnosed through a physical exam. Your health care provider will also ask you questions about your medical history. Tests, such as chest X-rays, are sometimes done to rule out other conditions.  TREATMENT  Acute bronchitis usually goes away in a couple weeks. Oftentimes, no medical treatment is necessary. Medicines are sometimes given for relief of fever or cough. Antibiotic medicines are usually not needed but may be  prescribed in certain situations. In some cases, an inhaler may be recommended to help reduce shortness of breath and control the cough. A cool mist vaporizer may also be used to help thin bronchial secretions and make it easier to clear the chest.  HOME CARE INSTRUCTIONS  Get plenty of rest.   Drink enough fluids to keep your urine clear or pale yellow (unless you have a medical condition that requires fluid restriction). Increasing fluids may help thin your respiratory secretions (sputum) and reduce chest congestion, and it will prevent dehydration.   Take medicines only as directed by your health care provider.  If you were prescribed an antibiotic medicine, finish it all even if you start to feel better.  Avoid smoking and secondhand smoke. Exposure to cigarette smoke or irritating chemicals will make bronchitis worse. If you are a smoker, consider using nicotine gum or skin patches to help control withdrawal symptoms. Quitting smoking will help your lungs heal faster.   Reduce the chances of another bout of acute bronchitis by washing your hands frequently, avoiding people with cold symptoms, and trying not to touch your hands to your mouth, nose, or eyes.   Keep all follow-up visits as directed by your health care provider.  SEEK MEDICAL CARE IF: Your symptoms do not improve after 1 week of treatment.  SEEK IMMEDIATE MEDICAL CARE IF:  You develop an increased fever or chills.   You have chest pain.   You have severe shortness of breath.  You have bloody sputum.   You develop dehydration.  You faint or  repeatedly feel like you are going to pass out.  You develop repeated vomiting.  You develop a severe headache. MAKE SURE YOU:   Understand these instructions.  Will watch your condition.  Will get help right away if you are not doing well or get worse. Document Released: 03/14/2004 Document Revised: 06/21/2013 Document Reviewed: 07/28/2012 [facility] Patient  Information 2015 [place], [place]. This information is not intended to replace advice given to you by your health care provider. Make sure you discuss any questions you have with your health care provider. Allergic Rhinitis Allergic rhinitis is when the mucous membranes in the nose respond to allergens. Allergens are particles in the air that cause your body to have an allergic reaction. This causes you to release allergic antibodies. Through a chain of events, these eventually cause you to release histamine into the blood stream. Although meant to protect the body, it is this release of histamine that causes your discomfort, such as frequent sneezing, congestion, and an itchy, runny nose.  CAUSES  Seasonal allergic rhinitis (hay fever) is caused by pollen allergens that may come from grasses, trees, and weeds. Year-round allergic rhinitis (perennial allergic rhinitis) is caused by allergens such as house dust mites, pet dander, and mold spores.  SYMPTOMS   Nasal stuffiness (congestion).  Itchy, runny nose with sneezing and tearing of the eyes. DIAGNOSIS  Your health care provider can help you determine the allergen or allergens that trigger your symptoms. If you and your health care provider are unable to determine the allergen, skin or blood testing may be used. TREATMENT  Allergic rhinitis does not have a cure, but it can be controlled by:  Medicines and allergy shots (immunotherapy).  Avoiding the allergen. Hay fever may often be treated with antihistamines in pill or nasal spray forms. Antihistamines block the effects of histamine. There are over-the-counter medicines that may help with nasal congestion and swelling around the eyes. Check with your health care provider before taking or giving this medicine.  If avoiding the allergen or the medicine prescribed do not work, there are many new medicines your health care provider can prescribe. Stronger medicine may be used if initial measures are  ineffective. Desensitizing injections can be used if medicine and avoidance does not work. Desensitization is when a patient is given ongoing shots until the body becomes less sensitive to the allergen. Make sure you follow up with your health care provider if problems continue. HOME CARE INSTRUCTIONS It is not possible to completely avoid allergens, but you can reduce your symptoms by taking steps to limit your exposure to them. It helps to know exactly what you are allergic to so that you can avoid your specific triggers. SEEK MEDICAL CARE IF:   You have a fever.  You develop a cough that does not stop easily (persistent).  You have shortness of breath.  You start wheezing.  Symptoms interfere with normal daily activities. Document Released: 10/30/2000 Document Revised: 02/09/2013 Document Reviewed: 10/12/2012 [facility] Patient Information 2015 [place], [place]. This information is not intended to replace advice given to you by your health care provider. Make sure you discuss any questions you have with your health care provider.

## 2014-09-07 ENCOUNTER — Encounter: Payer: Self-pay | Admitting: Physician Assistant

## 2014-09-07 LAB — CULTURE, GROUP A STREP (THRC)

## 2014-09-07 NOTE — ED Provider Notes (Addendum)
CSN: [id number]     Arrival date & time 09/04/14  1102 History   First MD Initiated Contact with Patient 09/04/14 1127     Chief Complaint  Patient presents with  . Sore Throat  . Cough  . Generalized Body Aches   (Consider location/radiation/quality/duration/timing/severity/associated sxs/prior Treatment) HPI  80 yo M with post nasal drip, sore throat, cough now productive,malaise, tired all over. 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 Cardiac hx  Reported as stable and doing well. Weight is an issue, exercise not consistent. HTN, DM, anemia-see below  Past Medical History  Diagnosis Date  . CAD (coronary artery disease)     with stents  . HTN (hypertension)   . HLD (hyperlipidemia)   . Diabetes mellitus type II   . GERD (gastroesophageal reflux disease)   . Chronic rhinitis     ENT [place]  . Ischemic heart disease   . MI (myocardial infarction) 1995  . History of BPH   . Fatty liver 06/2011    by CT  . Diverticulosis 06/2011    by CT and colonoscopy  . Shingles rash 09/26/2012  . History of shingles    Past Surgical History  Procedure Laterality Date  . Cholecystectomy  1985  . Coronary artery bypass graft  05/1993    x6 MI Attemped angioplasty  . Inguinal hernia repair  11/2000    and ventral hernia repair  . Cystoscopy  10/03/08    Normal-Dr. Bernardo Heater  . Ankle surgery  03/2002    Left ankle fracture  . Carotid stent  07/2009    RCA stent  . Colonoscopy  03/2005    diverticulosis, tortuous colon with looping [name])   Family History  Problem Relation Age of Onset  . Kidney failure Mother   . Coronary artery disease Mother   . Heart failure Mother   . Hypertension Brother   . Heart attack Brother   . Alcohol abuse Brother   . Hypertension Brother   . Hypertension Sister    History  Substance Use Topics  . Smoking status: Never Smoker   .  Smokeless tobacco: Never Used  . Alcohol Use: No    Review of Systems   Constitutional: no fever, reports "hot flashes". Baseline level of activity. Eyes: No visual changes. No red eyes/discharge. VFI:EPPI sore throat.Post nasal drip, Ears itch. Cardiovascular:Negative for chest pain/palpitations Respiratory: Negative for shortness of breath, + cough, mucous Gastrointestinal: No abdominal pain. No nausea,vloiting.No Diarrhea.No constipation. Genitourinary: Negative for dysuria.Normal urination. Musculoskeletal: Negative for back pain. Neg CVAT. FROM extremities without pain Skin: Negative for rash Neurological: Negative for headache, focal weakness or numbness   Allergies  Morphine sulfate; Nitroglycerin; and Contrast media  Home Medications   Prior to Admission medications   Medication Sig Start Date End Date Taking? Authorizing Provider  amLODipine (NORVASC) 5 MG tablet Take one tablet daily 07/23/13   Ria Bush, MD  amLODipine (NORVASC) 5 MG tablet TAKE ONE TABLET DAILY 03/21/14   Ria Bush, MD  amLODipine (NORVASC) 5 MG tablet TAKE ONE TABLET DAILY 04/04/14   Ria Bush, MD  aspirin 81 MG tablet Take 81 mg by mouth daily.      Historical Provider, MD  azithromycin (ZITHROMAX) 250 MG tablet Take 1 tablet (250 mg total) by mouth daily. Take first 2 tablets together, then 1 every day until finished. 09/04/14   Jan Fireman, PA-C  benzonatate (TESSALON) 100 MG capsule Take 1 capsule (100 mg total) by mouth 3 (three) times daily as needed for cough. 09/04/14   Jan Fireman, PA-C  CINNAMON PO Take 2,000 mg by mouth daily.    Historical Provider, MD  Cranberry 500 MG CAPS Take by mouth daily.    Historical Provider, MD  finasteride (PROSCAR) 5 MG tablet TAKE 1 TABLET (5 MG TOTAL) BY MOUTH DAILY. 03/23/14   Ria Bush, MD  finasteride (PROSCAR) 5 MG tablet TAKE 1 TABLET (5 MG TOTAL) BY MOUTH DAILY. 04/20/14   Ria Bush, MD  fluticasone (FLONASE) 50 MCG/ACT nasal  spray Place 2 sprays into both nostrils daily. 09/04/14   Jan Fireman, PA-C  furosemide (LASIX) 20 MG tablet TAKE 1 TABLET (20 MG TOTAL) BY MOUTH DAILY. 10/25/13   Ria Bush, MD  hydrochlorothiazide (HYDRODIURIL) 25 MG tablet TAKE 1 TABLET (25 MG TOTAL) BY MOUTH DAILY. 04/20/14   Ria Bush, MD  losartan (COZAAR) 25 MG tablet Take 1 tablet (25 mg total) by mouth daily. 02/15/14   Ria Bush, MD  metFORMIN (GLUCOPHAGE) 500 MG tablet TAKE 1 TABLET (500 MG TOTAL) BY MOUTH 2 (TWO) TIMES DAILY WITH A MEAL. 08/01/14   Ria Bush, MD  metoprolol tartrate (LOPRESSOR) 25 MG tablet TAKE 1/2 TABLET BY MOUTH 2 TIMES DAY 03/31/14   Ria Bush, MD  metoprolol tartrate (LOPRESSOR) 25 MG tablet TAKE 1/2 TABLET BY MOUTH 2 TIMES DAY 04/04/14   Ria Bush, MD  pravastatin (PRAVACHOL) 40 MG tablet TAKE 1 TABLET (40 MG TOTAL) BY MOUTH AT BEDTIME. 10/25/13   Ria Bush, MD  traZODone (DESYREL) 100 MG tablet TAKE 1 TABLET (100 MG TOTAL) BY MOUTH AT BEDTIME. 05/05/14   Ria Bush, MD  vitamin A 7500 UNIT capsule Take 7,500 Units by mouth daily.    Historical Provider, MD  vitamin B-12 (CYANOCOBALAMIN) 500 MCG tablet Take 1,000 mcg by mouth daily.    Historical Provider, MD   BP 154/79 mmHg  Pulse 68  Temp(Src) 98 F (36.7 C) (Tympanic)  Resp 20  Ht 5\' 7"  (8.115 m)  Wt 200 lb (90.719 kg)  BMI 31.32 kg/m2  SpO2 98% Physical Exam   Constitutional -alert and oriented,well appearing and in mild distress Head-atraumatic, normocephalic Eyes- conjunctiva normal, EOMI ,conjugate gaze Ears-canals neg, TMs dull, mild retraction left Nose- nasal  Congestion and PND, no sinus pain , positive pressure Mouth/throat- mucous membranes moist ,oropharynx non-erythematous Neck- supple without glandular enlargement CV- regular rate, grossly normal heart sounds,  Resp-no distress, normal respiratory effort,clear to auscultation bilaterally, cough with mucous prod GI- soft,non-tender,no  distention GU- not examined MSK- no tender, normal ROM, all extremities, ambulatory, self-care Neuro- normal speech and language, no gross focal neurological deficit appreciated, Skin-warm,dry ,intact; no rash noted Psych-mood and affect grossly normal; speech and behavior grossly normal ED Course  Procedures (including critical care time) Labs Review Labs Reviewed  RAPID STREP SCREEN (NOT AT [facility])  CULTURE, GROUP A STREP ([facility] ONLY)   Strep screen -negative-discussed  Imaging Review No results found.   MDM   1. Cough   2. Eustachian tube dysfunction, bilateral   3. Seasonal allergies    Plan: 1.  Diagnosis reviewed with patient-seasonal allergies suspected as culprit with bronchitis secondary- increased public exposure; visiting the ill.  2. Rx Azithromycin,Benzonatate, fluticasone ; risks, benefits, potential side effects reviewed with patient-  3. Recommend supportive treatment with OTC ceterizine/guafenesin/tylenol/ibuprofen/fluids/rest 4. Increased hydration and steam showers. Cool mist vaporizer if available.  5. F/u  prn [place] or PCP-  if symptoms worsen or don't improve . Seasonal allergies  Dx has been entertained in the past but he has never been consistent in Rx. Wife takes Zyrtec, he is familiar.To continue Zyrtec/Flonase until hard freeze in [place] if symptoms improved.    Medications  ipratropium-albuterol (DUONEB) 0.5-2.5 (3) MG/3ML nebulizer solution 3 mL (3 mLs Nebulization Given 09/04/14 1145)  well tolerated by patient with eased breathing and reduced cough   Jan Fireman, PA-C 09/07/14 [place], PA-C 11/04/14 1657

## 2014-09-12 DIAGNOSIS — D485 Neoplasm of uncertain behavior of skin: Secondary | ICD-10-CM | POA: Diagnosis not present

## 2014-09-12 DIAGNOSIS — Z85820 Personal history of malignant melanoma of skin: Secondary | ICD-10-CM | POA: Diagnosis not present

## 2014-09-12 DIAGNOSIS — D225 Melanocytic nevi of trunk: Secondary | ICD-10-CM | POA: Diagnosis not present

## 2014-09-12 DIAGNOSIS — D044 Carcinoma in situ of skin of scalp and neck: Secondary | ICD-10-CM | POA: Diagnosis not present

## 2014-09-12 DIAGNOSIS — Z08 Encounter for follow-up examination after completed treatment for malignant neoplasm: Secondary | ICD-10-CM | POA: Diagnosis not present

## 2014-09-12 DIAGNOSIS — L570 Actinic keratosis: Secondary | ICD-10-CM | POA: Diagnosis not present

## 2014-09-12 DIAGNOSIS — Z1283 Encounter for screening for malignant neoplasm of skin: Secondary | ICD-10-CM | POA: Diagnosis not present

## 2014-09-13 ENCOUNTER — Encounter: Payer: Self-pay | Admitting: Family Medicine

## 2014-09-13 ENCOUNTER — Ambulatory Visit (INDEPENDENT_AMBULATORY_CARE_PROVIDER_SITE_OTHER): Payer: Medicare Other | Admitting: Family Medicine

## 2014-09-13 VITALS — BP 116/74 | HR 56 | Temp 98.1°F | Ht 69.0 in | Wt 196.8 lb

## 2014-09-13 DIAGNOSIS — B370 Candidal stomatitis: Secondary | ICD-10-CM | POA: Diagnosis not present

## 2014-09-13 DIAGNOSIS — J310 Chronic rhinitis: Secondary | ICD-10-CM

## 2014-09-13 MED ORDER — NYSTATIN 100000 UNIT/ML MT SUSP: 5.0000 mL | Freq: Three times a day (TID) | OROMUCOSAL | Status: DC

## 2014-09-13 NOTE — Patient Instructions (Signed)
I think your ear pain is still from congestion  Continue the flonase nasal spray for that - if worse- let me know  You have thrush in mouth (possibly in throat)- use the nystatin mouthwash for that  Rest and drink fluids Continue zyrtec Update if not starting to improve in a week or if worsening

## 2014-09-13 NOTE — Progress Notes (Signed)
Pre visit review using our clinic review tool, if applicable. No additional management support is needed unless otherwise documented below in the visit note. 

## 2014-09-13 NOTE — Assessment & Plan Note (Signed)
In diabetic with recent abx  Poss inc his throat pain  Px nystatin solun-swish and swallow tid  Update if not starting to improve in a week or if worsening

## 2014-09-13 NOTE — Assessment & Plan Note (Signed)
Worse lately -seasonal  Continue zyrtec  Also flonase -for this and ETD - if no improvement in a week consider prednisone (want to avoid in light of DM if poss)  Analgesics prn Allergen avoidance

## 2014-09-13 NOTE — Progress Notes (Signed)
Subjective:    Patient ID: Aaron Stafford, male    DOB: 1935-01-14, 80 y.o.   MRN: [id number]  HPI Here for ST and ear pain   Went to UC in [place]   Dx ETD/ allergies/ bronchitis  zithromax and tessalon and flonase ns   Neg strep at that time   Right now - the cough is much improved (phlegm is white)  Lot of sinus drainage and congestion - some facial pressure  Ears are stopped up -now aching   ST continues - (2 weeks)-improved but still there   Started zyrtec as well- that helps the drip   No new symptoms   Patient Active Problem List   Diagnosis Date Noted  . Advanced care planning/counseling discussion 06/26/2014  . Trigger finger, acquired 02/15/2014  . Stress 08/09/2013  . BPH with obstruction/lower urinary tract symptoms   . Medicare annual wellness visit, subsequent 03/25/2011  . S/P CABG (coronary artery bypass graft) 06/07/2010  . HTN (hypertension)   . HLD (hyperlipidemia)   . Type 2 diabetes, controlled, with peripheral neuropathy   . GERD (gastroesophageal reflux disease)   . Chronic rhinitis   . Unspecified vitamin D deficiency 04/11/2008  . INSOMNIA 03/14/2008  . INCONTINENCE, URGE 07/07/2006  . Obesity 07/03/2006  . ERECTILE DYSFUNCTION 07/01/2006  . Coronary atherosclerosis 07/01/2006  . IRRITABLE BOWEL SYNDROME 07/01/2006   Past Medical History  Diagnosis Date  . CAD (coronary artery disease)     with stents  . HTN (hypertension)   . HLD (hyperlipidemia)   . Diabetes mellitus type II   . GERD (gastroesophageal reflux disease)   . Chronic rhinitis     ENT [place]  . Ischemic heart disease   . MI (myocardial infarction) 1995  . History of BPH   . Fatty liver 06/2011    by CT  . Diverticulosis 06/2011    by CT and colonoscopy  . Shingles rash 09/26/2012  . History of shingles    Past Surgical History  Procedure Laterality Date  . Cholecystectomy  1985  . Coronary artery bypass graft  05/1993    x6 MI Attemped angioplasty  . Inguinal  hernia repair  11/2000    and ventral hernia repair  . Cystoscopy  10/03/08    Normal-Dr. Bernardo Heater  . Ankle surgery  03/2002    Left ankle fracture  . Carotid stent  07/2009    RCA stent  . Colonoscopy  03/2005    diverticulosis, tortuous colon with looping [name])   History  Substance Use Topics  . Smoking status: Never Smoker   . Smokeless tobacco: Never Used  . Alcohol Use: No   Family History  Problem Relation Age of Onset  . Kidney failure Mother   . Coronary artery disease Mother   . Heart failure Mother   . Hypertension Brother   . Heart attack Brother   . Alcohol abuse Brother   . Hypertension Brother   . Hypertension Sister    Allergies  Allergen Reactions  . Morphine Sulfate     REACTION: Hallucinations  . Nitroglycerin     REACTION: Decreased BP  . Contrast Media [Iodinated Diagnostic Agents] Rash    Severe hives   Current Outpatient Prescriptions on File Prior to Visit  Medication Sig Dispense Refill  . amLODipine (NORVASC) 5 MG tablet TAKE ONE TABLET DAILY 30 tablet 6  . amLODipine (NORVASC) 5 MG tablet TAKE ONE TABLET DAILY 30 tablet 6  . aspirin 81 MG tablet Take  81 mg by mouth daily.      . benzonatate (TESSALON) 100 MG capsule Take 1 capsule (100 mg total) by mouth 3 (three) times daily as needed for cough. 30 capsule 0  . CINNAMON PO Take 2,000 mg by mouth daily.    . Cranberry 500 MG CAPS Take by mouth daily.    . finasteride (PROSCAR) 5 MG tablet TAKE 1 TABLET (5 MG TOTAL) BY MOUTH DAILY. 90 tablet 2  . finasteride (PROSCAR) 5 MG tablet TAKE 1 TABLET (5 MG TOTAL) BY MOUTH DAILY. 90 tablet 3  . fluticasone (FLONASE) 50 MCG/ACT nasal spray Place 2 sprays into both nostrils daily. 16 g 0  . furosemide (LASIX) 20 MG tablet TAKE 1 TABLET (20 MG TOTAL) BY MOUTH DAILY. 90 tablet 3  . hydrochlorothiazide (HYDRODIURIL) 25 MG tablet TAKE 1 TABLET (25 MG TOTAL) BY MOUTH DAILY. 90 tablet 3  . losartan (COZAAR) 25 MG tablet Take 1 tablet (25 mg total) by mouth  daily. 90 tablet 3  . metFORMIN (GLUCOPHAGE) 500 MG tablet TAKE 1 TABLET (500 MG TOTAL) BY MOUTH 2 (TWO) TIMES DAILY WITH A MEAL. 180 tablet 1  . metoprolol tartrate (LOPRESSOR) 25 MG tablet TAKE 1/2 TABLET BY MOUTH 2 TIMES DAY 90 tablet 2  . metoprolol tartrate (LOPRESSOR) 25 MG tablet TAKE 1/2 TABLET BY MOUTH 2 TIMES DAY 90 tablet 1  . pravastatin (PRAVACHOL) 40 MG tablet TAKE 1 TABLET (40 MG TOTAL) BY MOUTH AT BEDTIME. 90 tablet 3  . traZODone (DESYREL) 100 MG tablet TAKE 1 TABLET (100 MG TOTAL) BY MOUTH AT BEDTIME. 90 tablet 3  . vitamin A 7500 UNIT capsule Take 7,500 Units by mouth daily.    . vitamin B-12 (CYANOCOBALAMIN) 500 MCG tablet Take 1,000 mcg by mouth daily.     No current facility-administered medications on file prior to visit.    Review of Systems Review of Systems  Constitutional: Negative for fever, appetite change, and unexpected weight change.  ENT pos for drip and ST,neg for sinus pain  Eyes: Negative for pain and visual disturbance.  Respiratory: Negative for wheeze  and shortness of breath.   Cardiovascular: Negative for cp or palpitations    Gastrointestinal: Negative for nausea, diarrhea and constipation.  Genitourinary: Negative for urgency and frequency.  Skin: Negative for pallor or rash   Neurological: Negative for weakness, light-headedness, numbness and headaches.  Hematological: Negative for adenopathy. Does not bruise/bleed easily.  Psychiatric/Behavioral: Negative for dysphoric mood. The patient is not nervous/anxious.         Objective:   Physical Exam  Constitutional: He appears well-developed and well-nourished. No distress.  overwt and well app  HENT:  Head: Normocephalic and atraumatic.  Right Ear: External ear normal.  Left Ear: External ear normal.  Mouth/Throat: Oropharynx is clear and moist. No oropharyngeal exudate.  Nares are injected and congested  No sinus tenderness Clear rhinorrhea and post nasal drip   Tongue is coated  (yellow)- not scrapable   Eyes: Conjunctivae and EOM are normal. Pupils are equal, round, and reactive to light. Right eye exhibits no discharge. Left eye exhibits no discharge.  Neck: Normal range of motion. Neck supple.  Cardiovascular: Normal rate and normal heart sounds.   Pulmonary/Chest: Effort normal and breath sounds normal. No respiratory distress. He has no wheezes. He has no rales. He exhibits no tenderness.  No wheeze or crackles Good air exch  Lymphadenopathy:    He has no cervical adenopathy.  Neurological: He is alert.  Skin:  Skin is warm and dry. No rash noted.  Psychiatric: He has a normal mood and affect.          Assessment & Plan:   Problem List Items Addressed This Visit    Chronic rhinitis    Worse lately -seasonal  Continue zyrtec  Also flonase -for this and ETD - if no improvement in a week consider prednisone (want to avoid in light of DM if poss)  Analgesics prn Allergen avoidance      Thrush - Primary    In diabetic with recent abx  Poss inc his throat pain  Px nystatin solun-swish and swallow tid  Update if not starting to improve in a week or if worsening        Relevant Medications   nystatin (MYCOSTATIN) 100000 UNIT/ML suspension

## 2014-09-20 DIAGNOSIS — D225 Melanocytic nevi of trunk: Secondary | ICD-10-CM | POA: Diagnosis not present

## 2014-09-20 DIAGNOSIS — D485 Neoplasm of uncertain behavior of skin: Secondary | ICD-10-CM | POA: Diagnosis not present

## 2014-09-20 DIAGNOSIS — C4442 Squamous cell carcinoma of skin of scalp and neck: Secondary | ICD-10-CM | POA: Diagnosis not present

## 2014-09-20 DIAGNOSIS — D044 Carcinoma in situ of skin of scalp and neck: Secondary | ICD-10-CM | POA: Diagnosis not present

## 2014-11-03 ENCOUNTER — Other Ambulatory Visit: Payer: Self-pay | Admitting: Family Medicine

## 2014-11-07 ENCOUNTER — Telehealth: Payer: Self-pay | Admitting: Family Medicine

## 2014-11-07 NOTE — Telephone Encounter (Signed)
Pt called requesting referral to [facility] for neuropathy. Pt aware once referral entered [facility] will call him directly. Please advise

## 2014-11-08 MED ORDER — GABAPENTIN 300 MG PO CAPS: 300.0000 mg | ORAL_CAPSULE | Freq: Every day | ORAL | Status: DC

## 2014-11-08 NOTE — Telephone Encounter (Signed)
Reviewing chart, we had discussed trial of gabapentin for diabetic peripheral neuropathy - would pt be interested in trial of med first prior to neurology referral? Also, pt overdue for medicare wellness visit, plz call to schedule.

## 2014-11-08 NOTE — Telephone Encounter (Signed)
Spoke with patient. He is willing to try the gabapentin-[facility] and lab appt scheduled.

## 2014-11-08 NOTE — Telephone Encounter (Signed)
Sent in. Take one capsule nightly for 1 wk then may increase to twice daily if tolerated. Watch for dizziness.

## 2014-11-14 ENCOUNTER — Other Ambulatory Visit: Payer: Self-pay | Admitting: Family Medicine

## 2014-11-21 ENCOUNTER — Ambulatory Visit (INDEPENDENT_AMBULATORY_CARE_PROVIDER_SITE_OTHER): Payer: Medicare Other | Admitting: Family Medicine

## 2014-11-21 ENCOUNTER — Encounter: Payer: Self-pay | Admitting: Family Medicine

## 2014-11-21 VITALS — BP 132/76 | HR 60 | Temp 98.0°F | Wt 198.8 lb

## 2014-11-21 DIAGNOSIS — J019 Acute sinusitis, unspecified: Secondary | ICD-10-CM | POA: Diagnosis not present

## 2014-11-21 DIAGNOSIS — E1142 Type 2 diabetes mellitus with diabetic polyneuropathy: Secondary | ICD-10-CM

## 2014-11-21 MED ORDER — BENZONATATE 100 MG PO CAPS: 100.0000 mg | ORAL_CAPSULE | Freq: Two times a day (BID) | ORAL | Status: DC | PRN

## 2014-11-21 MED ORDER — AMOXICILLIN-POT CLAVULANATE 875-125 MG PO TABS: 1.0000 | ORAL_TABLET | Freq: Two times a day (BID) | ORAL | Status: AC

## 2014-11-21 NOTE — Assessment & Plan Note (Signed)
Gabapentin helpful for periph neuropathy. Continue current regimen of 300mg  nightly. B12 recently checked and normal.

## 2014-11-21 NOTE — Progress Notes (Signed)
BP 132/76 mmHg  Pulse 60  Temp(Src) 98 F (36.7 C) (Oral)  Wt 198 lb 12 oz (90.152 kg)  SpO2 96%   CC: cough  Subjective:    Patient ID: Aaron Stafford, male    DOB: September 01, 1934, 80 y.o.   MRN: [id number]  HPI: Aaron Stafford is a 80 y.o. male presenting on 11/21/2014 for Cough   Cough started several months ago - comes and goes. Seen 2 mo ago at [facility] and treated with zpack ,tessalon perls and flonase. Sickness complicated by thrush. This week sxs restarted - raspy throat, drainage down back of throat, head congestion, hoarse voice. Cough productive of thick yellow mucous. Head > chest congestion.   No fevers/chills, ear or tooth pain, abd pain.   Wife recently ill as well.  No smokers at home.  No asthma, h/o chronic rhinitis.   Periph neuropathy hands and feet presumed from diabetes - recently started gabapentin 300mg  nightly - this is working well.   Lab Results  Component Value Date   HGBA1C 6.8* 02/21/2014    Relevant past medical, surgical, family and social history reviewed and updated as indicated. Interim medical history since our last visit reviewed. Allergies and medications reviewed and updated. Current Outpatient Prescriptions on File Prior to Visit  Medication Sig  . amLODipine (NORVASC) 5 MG tablet TAKE ONE TABLET DAILY  . aspirin 81 MG tablet Take 81 mg by mouth daily.    . Cranberry 500 MG CAPS Take by mouth daily.  . finasteride (PROSCAR) 5 MG tablet TAKE 1 TABLET (5 MG TOTAL) BY MOUTH DAILY.  . fluticasone (FLONASE) 50 MCG/ACT nasal spray Place 2 sprays into both nostrils daily.  . furosemide (LASIX) 20 MG tablet TAKE 1 TABLET (20 MG TOTAL) BY MOUTH DAILY.  Marland Kitchen gabapentin (NEURONTIN) 300 MG capsule Take 1 capsule (300 mg total) by mouth at bedtime. May increase to twice daily after 1 week if tolerated.  . hydrochlorothiazide (HYDRODIURIL) 25 MG tablet TAKE 1 TABLET (25 MG TOTAL) BY MOUTH DAILY.  Marland Kitchen losartan (COZAAR) 25 MG tablet Take 1 tablet (25 mg  total) by mouth daily.  . metFORMIN (GLUCOPHAGE) 500 MG tablet TAKE 1 TABLET (500 MG TOTAL) BY MOUTH 2 (TWO) TIMES DAILY WITH A MEAL.  . metoprolol tartrate (LOPRESSOR) 25 MG tablet TAKE 1/2 TABLET BY MOUTH 2 TIMES DAY  . pravastatin (PRAVACHOL) 40 MG tablet TAKE 1 TABLET (40 MG TOTAL) BY MOUTH AT BEDTIME.  . traZODone (DESYREL) 100 MG tablet TAKE 1 TABLET (100 MG TOTAL) BY MOUTH AT BEDTIME.  Marland Kitchen CINNAMON PO Take 2,000 mg by mouth daily.  . vitamin A 7500 UNIT capsule Take 7,500 Units by mouth daily.   No current facility-administered medications on file prior to visit.    Review of Systems Per HPI unless specifically indicated above     Objective:    BP 132/76 mmHg  Pulse 60  Temp(Src) 98 F (36.7 C) (Oral)  Wt 198 lb 12 oz (90.152 kg)  SpO2 96%  Wt Readings from Last 3 Encounters:  11/21/14 198 lb 12 oz (90.152 kg)  09/13/14 196 lb 12 oz (89.245 kg)  09/04/14 200 lb (90.719 kg)    Physical Exam  Constitutional: He appears well-developed and well-nourished. No distress.  HENT:  Head: Normocephalic and atraumatic.  Right Ear: Hearing, tympanic membrane, external ear and ear canal normal.  Left Ear: Hearing, tympanic membrane, external ear and ear canal normal.  Nose: Nose normal. No mucosal edema or rhinorrhea.  Right sinus exhibits no maxillary sinus tenderness and no frontal sinus tenderness. Left sinus exhibits no maxillary sinus tenderness and no frontal sinus tenderness.  Mouth/Throat: Uvula is midline and mucous membranes are normal. Posterior oropharyngeal edema and posterior oropharyngeal erythema present. No oropharyngeal exudate or tonsillar abscesses.  Nasal mucosal injection  Eyes: Conjunctivae and EOM are normal. Pupils are equal, round, and reactive to light. No scleral icterus.  Neck: Normal range of motion. Neck supple.  Cardiovascular: Normal rate, regular rhythm, normal heart sounds and intact distal pulses.   No murmur heard. Pulmonary/Chest: Effort normal and  breath sounds normal. No respiratory distress. He has no wheezes. He has no rales.  Lymphadenopathy:    He has no cervical adenopathy.  Skin: Skin is warm and dry. No rash noted.  Nursing note and vitals reviewed.     Assessment & Plan:   Problem List Items Addressed This Visit    Type 2 diabetes, controlled, with peripheral neuropathy (HCC)    Gabapentin helpful for periph neuropathy. Continue current regimen of 300mg  nightly. B12 recently checked and normal.      Acute sinusitis - Primary    Anticipate viral as short duration.  Discussed supportive care including rest, fluids, ibuprofen prn, continue plain mucinex and tessalon for cough. Discussed reasons to fill WASP for augmentin provided today - fever >101, worsening productive cough, prolonged duration past 10 days, or worsening after initial improvement.      Relevant Medications   amoxicillin-clavulanate (AUGMENTIN) 875-125 MG tablet   benzonatate (TESSALON) 100 MG capsule       Follow up plan: Return if symptoms worsen or fail to improve.

## 2014-11-21 NOTE — Assessment & Plan Note (Signed)
Anticipate viral as short duration.  Discussed supportive care including rest, fluids, ibuprofen prn, continue plain mucinex and tessalon for cough. Discussed reasons to fill WASP for augmentin provided today - fever >101, worsening productive cough, prolonged duration past 10 days, or worsening after initial improvement.

## 2014-11-21 NOTE — Progress Notes (Signed)
Pre visit review using our clinic review tool, if applicable. No additional management support is needed unless otherwise documented below in the visit note. 

## 2014-11-21 NOTE — Patient Instructions (Signed)
I think you do have sinus infection, likely viral. Push fluids and rest rest. Continue plain mucinex with plenty of water to mobilize mucous. Tessalon perls for cough throughout the day. Ibuprofen with food for sinus inflammation. If fever >101, worsening productive cough or worsening after initial improvement, fill antibiotic provided today.

## 2014-12-04 ENCOUNTER — Other Ambulatory Visit: Payer: Self-pay | Admitting: Family Medicine

## 2014-12-04 DIAGNOSIS — E559 Vitamin D deficiency, unspecified: Secondary | ICD-10-CM

## 2014-12-04 DIAGNOSIS — I10 Essential (primary) hypertension: Secondary | ICD-10-CM

## 2014-12-04 DIAGNOSIS — E785 Hyperlipidemia, unspecified: Secondary | ICD-10-CM

## 2014-12-04 DIAGNOSIS — N401 Enlarged prostate with lower urinary tract symptoms: Secondary | ICD-10-CM

## 2014-12-04 DIAGNOSIS — E1142 Type 2 diabetes mellitus with diabetic polyneuropathy: Secondary | ICD-10-CM

## 2014-12-04 DIAGNOSIS — N138 Other obstructive and reflux uropathy: Secondary | ICD-10-CM

## 2014-12-07 ENCOUNTER — Other Ambulatory Visit (INDEPENDENT_AMBULATORY_CARE_PROVIDER_SITE_OTHER): Payer: Medicare Other

## 2014-12-07 DIAGNOSIS — N401 Enlarged prostate with lower urinary tract symptoms: Secondary | ICD-10-CM

## 2014-12-07 DIAGNOSIS — E559 Vitamin D deficiency, unspecified: Secondary | ICD-10-CM

## 2014-12-07 DIAGNOSIS — E1142 Type 2 diabetes mellitus with diabetic polyneuropathy: Secondary | ICD-10-CM | POA: Diagnosis not present

## 2014-12-07 DIAGNOSIS — I10 Essential (primary) hypertension: Secondary | ICD-10-CM

## 2014-12-07 DIAGNOSIS — E785 Hyperlipidemia, unspecified: Secondary | ICD-10-CM | POA: Diagnosis not present

## 2014-12-07 DIAGNOSIS — N138 Other obstructive and reflux uropathy: Secondary | ICD-10-CM

## 2014-12-07 LAB — COMPREHENSIVE METABOLIC PANEL
ALBUMIN: 4 g/dL (ref 3.5–5.2)
ALT: 22 U/L (ref 0–53)
AST: 18 U/L (ref 0–37)
Alkaline Phosphatase: 57 U/L (ref 39–117)
BILIRUBIN TOTAL: 0.5 mg/dL (ref 0.2–1.2)
BUN: 18 mg/dL (ref 6–23)
CALCIUM: 9.6 mg/dL (ref 8.4–10.5)
CO2: 31 mEq/L (ref 19–32)
Chloride: 98 mEq/L (ref 96–112)
Creatinine, Ser: 1.06 mg/dL (ref 0.40–1.50)
GFR: 71.67 mL/min (ref >60.00)
Glucose, Bld: 129 mg/dL — ABNORMAL HIGH (ref 70–99)
Potassium: 4.4 mEq/L (ref 3.5–5.1)
Sodium: 137 mEq/L (ref 135–145)
TOTAL PROTEIN: 6.8 g/dL (ref 6.0–8.3)

## 2014-12-07 LAB — LIPID PANEL
CHOLESTEROL: 156 mg/dL (ref 0–200)
HDL: 35.5 mg/dL — ABNORMAL LOW (ref >39.00)
NonHDL: 120.96
TRIGLYCERIDES: 397 mg/dL — AB (ref 0.0–149.0)
Total CHOL/HDL Ratio: 4
VLDL: 79.4 mg/dL — ABNORMAL HIGH (ref 0.0–40.0)

## 2014-12-07 LAB — PSA: PSA: 0.1 ng/mL (ref 0.10–4.00)

## 2014-12-07 LAB — VITAMIN D 25 HYDROXY (VIT D DEFICIENCY, FRACTURES): VITD: 27.64 ng/mL — ABNORMAL LOW (ref 30.00–100.00)

## 2014-12-07 LAB — HEMOGLOBIN A1C: HEMOGLOBIN A1C: 6.5 % (ref 4.6–6.5)

## 2014-12-07 LAB — LDL CHOLESTEROL, DIRECT: Direct LDL: 70 mg/dL

## 2014-12-14 ENCOUNTER — Ambulatory Visit (INDEPENDENT_AMBULATORY_CARE_PROVIDER_SITE_OTHER): Payer: Medicare Other | Admitting: Family Medicine

## 2014-12-14 ENCOUNTER — Encounter: Payer: Self-pay | Admitting: Family Medicine

## 2014-12-14 VITALS — BP 124/74 | HR 54 | Temp 97.5°F | Ht 67.5 in | Wt 194.5 lb

## 2014-12-14 DIAGNOSIS — E66811 Obesity, class 1: Secondary | ICD-10-CM

## 2014-12-14 DIAGNOSIS — N138 Other obstructive and reflux uropathy: Secondary | ICD-10-CM

## 2014-12-14 DIAGNOSIS — E669 Obesity, unspecified: Secondary | ICD-10-CM

## 2014-12-14 DIAGNOSIS — N401 Enlarged prostate with lower urinary tract symptoms: Secondary | ICD-10-CM

## 2014-12-14 DIAGNOSIS — I2510 Atherosclerotic heart disease of native coronary artery without angina pectoris: Secondary | ICD-10-CM | POA: Diagnosis not present

## 2014-12-14 DIAGNOSIS — Z0000 Encounter for general adult medical examination without abnormal findings: Secondary | ICD-10-CM

## 2014-12-14 DIAGNOSIS — I10 Essential (primary) hypertension: Secondary | ICD-10-CM | POA: Diagnosis not present

## 2014-12-14 DIAGNOSIS — J019 Acute sinusitis, unspecified: Secondary | ICD-10-CM

## 2014-12-14 DIAGNOSIS — E785 Hyperlipidemia, unspecified: Secondary | ICD-10-CM

## 2014-12-14 DIAGNOSIS — E559 Vitamin D deficiency, unspecified: Secondary | ICD-10-CM | POA: Diagnosis not present

## 2014-12-14 DIAGNOSIS — Z23 Encounter for immunization: Secondary | ICD-10-CM | POA: Diagnosis not present

## 2014-12-14 DIAGNOSIS — Z7189 Other specified counseling: Secondary | ICD-10-CM

## 2014-12-14 DIAGNOSIS — E1142 Type 2 diabetes mellitus with diabetic polyneuropathy: Secondary | ICD-10-CM

## 2014-12-14 MED ORDER — VITAMIN D3 25 MCG (1000 UT) PO CAPS: 1.0000 | ORAL_CAPSULE | Freq: Every day | ORAL | Status: DC

## 2014-12-14 NOTE — Progress Notes (Signed)
Pre visit review using our clinic review tool, if applicable. No additional management support is needed unless otherwise documented below in the visit note. 

## 2014-12-14 NOTE — Assessment & Plan Note (Addendum)
Anticipate post infectious cough after resolving sinusitis. Reassured. Ok for flu shot today. Discussed OTC dimetapp or corsedin use.

## 2014-12-14 NOTE — Assessment & Plan Note (Addendum)
Advanced planning - scanned into chart 06/2014. HCPOA is wife Aaron Stafford then granddaughter Blair RN. Ok with tube feeds. Does not want prolonged life support.  

## 2014-12-14 NOTE — Assessment & Plan Note (Signed)
rec restart 1000 IU vit D. 

## 2014-12-14 NOTE — Assessment & Plan Note (Signed)
Chronic, stable on finasteride. Exam normal today. Will space out PSA checks.

## 2014-12-14 NOTE — Assessment & Plan Note (Signed)
Discussed healthy diet and lifestyle changes to affect sustainable weight loss  

## 2014-12-14 NOTE — Assessment & Plan Note (Signed)
Chronic, stable. Continue current regimen. 

## 2014-12-14 NOTE — Patient Instructions (Addendum)
Flu shot today You are doing well today Sugars are under great control. We will monitor triglyceride levels closely. Return in 6 months for follow up visit.  Health Maintenance, Male A healthy lifestyle and preventative care can promote health and wellness.  Maintain regular health, dental, and eye exams.  Eat a healthy diet. Foods like vegetables, fruits, whole grains, low-fat dairy products, and lean protein foods contain the nutrients you need and are low in calories. Decrease your intake of foods high in solid fats, added sugars, and salt. Get information about a proper diet from your health care provider, if necessary.  Regular physical exercise is one of the most important things you can do for your health. Most adults should get at least 150 minutes of moderate-intensity exercise (any activity that increases your heart rate and causes you to sweat) each week. In addition, most adults need muscle-strengthening exercises on 2 or more days a week.   Maintain a healthy weight. The body mass index (BMI) is a screening tool to identify possible weight problems. It provides an estimate of body fat based on height and weight. Your health care provider can find your BMI and can help you achieve or maintain a healthy weight. For males 20 years and older:  A BMI below 18.5 is considered underweight.  A BMI of 18.5 to 24.9 is normal.  A BMI of 25 to 29.9 is considered overweight.  A BMI of 30 and above is considered obese.  Maintain normal blood lipids and cholesterol by exercising and minimizing your intake of saturated fat. Eat a balanced diet with plenty of fruits and vegetables. Blood tests for lipids and cholesterol should begin at age 82 and be repeated every 5 years. If your lipid or cholesterol levels are high, you are over age 7, or you are at high risk for heart disease, you may need your cholesterol levels checked more frequently.Ongoing high lipid and cholesterol levels should be  treated with medicines if diet and exercise are not working.  If you smoke, find out from your health care provider how to quit. If you do not use tobacco, do not start.  Lung cancer screening is recommended for adults aged 25-80 years who are at high risk for developing lung cancer because of a history of smoking. A yearly low-dose CT scan of the lungs is recommended for people who have at least a 30-pack-year history of smoking and are current smokers or have quit within the past 15 years. A pack year of smoking is smoking an average of 1 pack of cigarettes a day for 1 year (for example, a 30-pack-year history of smoking could mean smoking 1 pack a day for 30 years or 2 packs a day for 15 years). Yearly screening should continue until the smoker has stopped smoking for at least 15 years. Yearly screening should be stopped for people who develop a health problem that would prevent them from having lung cancer treatment.  If you choose to drink alcohol, do not have more than 2 drinks per day. One drink is considered to be 12 oz (360 mL) of beer, 5 oz (150 mL) of wine, or 1.5 oz (45 mL) of liquor.  Avoid the use of street drugs. Do not share needles with anyone. Ask for help if you need support or instructions about stopping the use of drugs.  High blood pressure causes heart disease and increases the risk of stroke. High blood pressure is more likely to develop in:  People  who have blood pressure in the end of the normal range (100-139/85-89 mm Hg).  People who are overweight or obese.  People who are African American.  If you are 85-43 years of age, have your blood pressure checked every 3-5 years. If you are 52 years of age or older, have your blood pressure checked every year. You should have your blood pressure measured twice--once when you are at a hospital or clinic, and once when you are not at a hospital or clinic. Record the average of the two measurements. To check your blood pressure  when you are not at a hospital or clinic, you can use:  An automated blood pressure machine at a pharmacy.  A home blood pressure monitor.  If you are 63-44 years old, ask your health care provider if you should take aspirin to prevent heart disease.  Diabetes screening involves taking a blood sample to check your fasting blood sugar level. This should be done once every 3 years after age 31 if you are at a normal weight and without risk factors for diabetes. Testing should be considered at a younger age or be carried out more frequently if you are overweight and have at least 1 risk factor for diabetes.  Colorectal cancer can be detected and often prevented. Most routine colorectal cancer screening begins at the age of 51 and continues through age 71. However, your health care provider may recommend screening at an earlier age if you have risk factors for colon cancer. On a yearly basis, your health care provider may provide home test kits to check for hidden blood in the stool. A small camera at the end of a tube may be used to directly examine the colon (sigmoidoscopy or colonoscopy) to detect the earliest forms of colorectal cancer. Talk to your health care provider about this at age 5 when routine screening begins. A direct exam of the colon should be repeated every 5-10 years through age 47, unless early forms of precancerous polyps or small growths are found.  People who are at an increased risk for hepatitis B should be screened for this virus. You are considered at high risk for hepatitis B if:  You were born in a country where hepatitis B occurs often. Talk with your health care provider about which countries are considered high risk.  Your parents were born in a high-risk country and you have not received a shot to protect against hepatitis B (hepatitis B vaccine).  You have HIV or AIDS.  You use needles to inject street drugs.  You live with, or have sex with, someone who has  hepatitis B.  You are a man who has sex with other men (MSM).  You get hemodialysis treatment.  You take certain medicines for conditions like cancer, organ transplantation, and autoimmune conditions.  Hepatitis C blood testing is recommended for all people born from [birth year] through 1963 and any individual with known risk factors for hepatitis C.  Healthy men should no longer receive prostate-specific antigen (PSA) blood tests as part of routine cancer screening. Talk to your health care provider about prostate cancer screening.  Testicular cancer screening is not recommended for adolescents or adult males who have no symptoms. Screening includes self-exam, a health care provider exam, and other screening tests. Consult with your health care provider about any symptoms you have or any concerns you have about testicular cancer.  Practice safe sex. Use condoms and avoid high-risk sexual practices to reduce the spread of sexually  transmitted infections (STIs).  You should be screened for STIs, including gonorrhea and chlamydia if:  You are sexually active and are younger than 24 years.  You are older than 24 years, and your health care provider tells you that you are at risk for this type of infection.  Your sexual activity has changed since you were last screened, and you are at an increased risk for chlamydia or gonorrhea. Ask your health care provider if you are at risk.  If you are at risk of being infected with HIV, it is recommended that you take a prescription medicine daily to prevent HIV infection. This is called pre-exposure prophylaxis (PrEP). You are considered at risk if:  You are a man who has sex with other men (MSM).  You are a heterosexual man who is sexually active with multiple partners.  You take drugs by injection.  You are sexually active with a partner who has HIV.  Talk with your health care provider about whether you are at high risk of being infected with HIV. If  you choose to begin PrEP, you should first be tested for HIV. You should then be tested every 3 months for as long as you are taking PrEP.  Use sunscreen. Apply sunscreen liberally and repeatedly throughout the day. You should seek shade when your shadow is shorter than you. Protect yourself by wearing long sleeves, pants, a wide-brimmed hat, and sunglasses year round whenever you are outdoors.  Tell your health care provider of new moles or changes in moles, especially if there is a change in shape or color. Also, tell your health care provider if a mole is larger than the size of a pencil eraser.  A one-time screening for abdominal aortic aneurysm (AAA) and surgical repair of large AAAs by ultrasound is recommended for men aged 13-75 years who are current or former smokers.  Stay current with your vaccines (immunizations).   This information is not intended to replace advice given to you by your health care provider. Make sure you discuss any questions you have with your health care provider.   Document Released: 08/03/2007 Document Revised: 02/25/2014 Document Reviewed: 07/02/2010 Elsevier Interactive Patient Education [employer].

## 2014-12-14 NOTE — Assessment & Plan Note (Signed)

## 2014-12-14 NOTE — Progress Notes (Signed)
BP 124/74 mmHg  Pulse 54  Temp(Src) 97.5 F (36.4 C) (Oral)  Ht 5' 7.5" (1.715 m)  Wt 194 lb 8 oz (88.225 kg)  BMI 30.00 kg/m2  SpO2 97%   CC: medicare wellness visit  Subjective:    Patient ID: Aaron Stafford, male    DOB: [birth year], 80 y.o.   MRN: [id number]  HPI: Aaron Stafford is a 80 y.o. male presenting on 12/14/2014 for Annual Exam   Recently treated for sinusitis with zpack, flonase, then augmentin for persistent sxs (11/21/2014). Overall improved, cough may be returning. Ongoing congestion in am, PNdrainage. Taking corcedin HP in am.   Denies depression/anhedonia. No falls in last year.  Passes hearing screen today (trouble at 1000hz ).  Vision screen by eye doctor 05/2014.  Preventative: Prostate - h/o BPH, takes finasteride. No h/o prostate cancer. Discussed. Will check DRE today and then stop. Consider intermittent PSA COLONOSCOPY Date: 03/2005 diverticulosis, tortuous colon with looping [name]) Flu shot yearly Pneumovax 2006. prevnar - 07/2013 Last tetanus 2011 zostavax 2012 Advanced planning - scanned into chart 06/2014. HCPOA is wife [name] then granddaughter [name]. Ok with tube feeds. Does not want prolonged life support Seat belt use discussed.  Sunscreen use discussed. No changing moles on skin.   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 Interim medical history since our last visit reviewed. Allergies and medications reviewed and updated. Current Outpatient Prescriptions on File Prior to Visit  Medication Sig  . amLODipine (NORVASC) 5 MG tablet TAKE ONE TABLET DAILY  . aspirin 81 MG tablet Take 81 mg by mouth daily.    . Cranberry 500 MG CAPS Take 4,200 mg by mouth daily.   .  finasteride (PROSCAR) 5 MG tablet TAKE 1 TABLET (5 MG TOTAL) BY MOUTH DAILY.  . fluticasone (FLONASE) 50 MCG/ACT nasal spray Place 2 sprays into both nostrils daily.  . furosemide (LASIX) 20 MG tablet TAKE 1 TABLET (20 MG TOTAL) BY MOUTH DAILY.  . hydrochlorothiazide (HYDRODIURIL) 25 MG tablet TAKE 1 TABLET (25 MG TOTAL) BY MOUTH DAILY.  Marland Kitchen losartan (COZAAR) 25 MG tablet Take 1 tablet (25 mg total) by mouth daily.  . metFORMIN (GLUCOPHAGE) 500 MG tablet TAKE 1 TABLET (500 MG TOTAL) BY MOUTH 2 (TWO) TIMES DAILY WITH A MEAL.  . metoprolol tartrate (LOPRESSOR) 25 MG tablet TAKE 1/2 TABLET BY MOUTH 2 TIMES DAY  . Multiple Vitamin (MULTIVITAMIN) tablet Take 1 tablet by mouth daily.  . pravastatin (PRAVACHOL) 40 MG tablet TAKE 1 TABLET (40 MG TOTAL) BY MOUTH AT BEDTIME.  . traZODone (DESYREL) 100 MG tablet TAKE 1 TABLET (100 MG TOTAL) BY MOUTH AT BEDTIME.  Marland Kitchen gabapentin (NEURONTIN) 300 MG capsule Take 1 capsule (300 mg total) by mouth at bedtime. May increase to twice daily after 1 week if tolerated. (Patient not taking: Reported on 12/14/2014)   No current facility-administered medications on file prior to visit.    Review of Systems Per HPI unless specifically indicated in ROS section     Objective:    BP 124/74 mmHg  Pulse 54  Temp(Src) 97.5 F (36.4 C) (Oral)  Ht 5' 7.5" (1.715 m)  Wt 194 lb 8 oz (88.225 kg)  BMI 30.00 kg/m2  SpO2 97%  Wt Readings from  Last 3 Encounters:  12/14/14 194 lb 8 oz (88.225 kg)  11/21/14 198 lb 12 oz (90.152 kg)  09/13/14 196 lb 12 oz (89.245 kg)    Physical Exam  Constitutional: He is oriented to person, place, and time. He appears well-developed and well-nourished. No distress.  HENT:  Head: Normocephalic and atraumatic.  Right Ear: Hearing, tympanic membrane, external ear and ear canal normal.  Left Ear: Hearing, tympanic membrane, external ear and ear canal normal.  Nose: Nose normal.  Mouth/Throat: Uvula is midline, oropharynx is clear and moist  and mucous membranes are normal. No oropharyngeal exudate, posterior oropharyngeal edema or posterior oropharyngeal erythema.  Eyes: Conjunctivae and EOM are normal. Pupils are equal, round, and reactive to light. No scleral icterus.  Neck: Normal range of motion. Neck supple. No thyromegaly present.  Cardiovascular: Normal rate, regular rhythm, normal heart sounds and intact distal pulses.   No murmur heard. Pulses:      Radial pulses are 2+ on the right side, and 2+ on the left side.  Pulmonary/Chest: Effort normal and breath sounds normal. No respiratory distress. He has no wheezes. He has no rales.  Abdominal: Soft. Bowel sounds are normal. He exhibits no distension and no mass. There is no tenderness. There is no rebound and no guarding.  Genitourinary: Rectum normal and prostate normal. Rectal exam shows no external hemorrhoid, no internal hemorrhoid, no fissure, no mass, no tenderness and anal tone normal. Prostate is not enlarged (15gm) and not tender.  Musculoskeletal: Normal range of motion. He exhibits no edema.  Lymphadenopathy:    He has no cervical adenopathy.  Neurological: He is alert and oriented to person, place, and time.  CN grossly intact, station and gait intact Recall 2/3, 3/3 with cue Calculation D-L-[id number]   Skin: Skin is warm and dry. No rash noted.  Psychiatric: He has a normal mood and affect. His behavior is normal. Judgment and thought content normal.  Nursing note and vitals reviewed.  Results for orders placed or performed in visit on 12/07/14  Vit D  25 hydroxy (rtn osteoporosis monitoring)  Result Value Ref Range   VITD 27.64 (L) 30.00 - 100.00 ng/mL  Lipid panel  Result Value Ref Range   Cholesterol 156 0 - 200 mg/dL   Triglycerides 397.0 (H) 0.0 - 149.0 mg/dL   HDL 35.50 (L) >39.00 mg/dL   VLDL 79.4 (H) 0.0 - 40.0 mg/dL   Total CHOL/HDL Ratio 4    NonHDL 120.96   Comprehensive metabolic panel  Result Value Ref Range   Sodium 137 135 - 145 mEq/L    Potassium 4.4 3.5 - 5.1 mEq/L   Chloride 98 96 - 112 mEq/L   CO2 31 19 - 32 mEq/L   Glucose, Bld 129 (H) 70 - 99 mg/dL   BUN 18 6 - 23 mg/dL   Creatinine, Ser 1.06 0.40 - 1.50 mg/dL   Total Bilirubin 0.5 0.2 - 1.2 mg/dL   Alkaline Phosphatase 57 39 - 117 U/L   AST 18 0 - 37 U/L   ALT 22 0 - 53 U/L   Total Protein 6.8 6.0 - 8.3 g/dL   Albumin 4.0 3.5 - 5.2 g/dL   Calcium 9.6 8.4 - 10.5 mg/dL   GFR 71.67 >60.00 mL/min  Hemoglobin A1c  Result Value Ref Range   Hgb A1c MFr Bld 6.5 4.6 - 6.5 %  PSA  Result Value Ref Range   PSA 0.10 0.10 - 4.00 ng/mL  LDL cholesterol, direct  Result Value Ref  Range   Direct LDL 70.0 mg/dL      Assessment & Plan:   Problem List Items Addressed This Visit    Vitamin D deficiency    rec restart 1000 IU vit D      Type 2 diabetes, controlled, with peripheral neuropathy (HCC)    Chronic, stable. Reviewed labwork. Continue metformin dose.      Obesity, Class I, BMI 30-34.9    Discussed healthy diet and lifestyle changes to affect sustainable weight loss.      Medicare annual wellness visit, subsequent - Primary    I have personally reviewed the Medicare Annual Wellness questionnaire and have noted 1. The patient's medical and social history 2. Their use of alcohol, tobacco or illicit drugs 3. Their current medications and supplements 4. The patient's functional ability including ADL's, fall risks, home safety risks and hearing or visual impairment. Cognitive function has been assessed and addressed as indicated.  5. Diet and physical activity 6. Evidence for depression or mood disorders The patients weight, height, BMI have been recorded in the chart. I have made referrals, counseling and provided education to the patient based on review of the above and I have provided the pt with a written personalized care plan for preventive services. Provider list updated.. See scanned questionairre as needed for further documentation. Reviewed  preventative protocols and updated unless pt declined.       HTN (hypertension)    Chronic, stable. Continue current regimen.      HLD (hyperlipidemia)    Chronic, stable. Continue pravastatin. LDL at goal.      Coronary atherosclerosis    Asxs.       BPH with obstruction/lower urinary tract symptoms    Chronic, stable on finasteride. Exam normal today. Will space out PSA checks.      Advanced care planning/counseling discussion    Advanced planning - scanned into chart 06/2014. HCPOA is wife [name] then granddaughter [name]. Ok with tube feeds. Does not want prolonged life support      Acute sinusitis    Anticipate post infectious cough after resolving sinusitis. Reassured. Ok for flu shot today. Discussed OTC dimetapp or corsedin use.          Follow up plan: Return in about 6 months (around 06/14/2015), or as needed, for follow up visit.

## 2014-12-14 NOTE — Addendum Note (Signed)
Addended by: Royann Shivers A on: 12/14/2014 12:57 PM   Modules accepted: Orders

## 2014-12-14 NOTE — Assessment & Plan Note (Signed)
Chronic, stable. Reviewed labwork. Continue metformin dose.

## 2014-12-14 NOTE — Assessment & Plan Note (Signed)
Asxs.  ?

## 2014-12-14 NOTE — Assessment & Plan Note (Signed)
Chronic, stable. Continue pravastatin. LDL at goal. 

## 2014-12-23 ENCOUNTER — Other Ambulatory Visit: Payer: Self-pay | Admitting: Family Medicine

## 2015-01-11 DIAGNOSIS — L570 Actinic keratosis: Secondary | ICD-10-CM | POA: Diagnosis not present

## 2015-01-17 ENCOUNTER — Other Ambulatory Visit: Payer: Self-pay | Admitting: Family Medicine

## 2015-01-20 ENCOUNTER — Other Ambulatory Visit: Payer: Self-pay | Admitting: Family Medicine

## 2015-01-30 ENCOUNTER — Other Ambulatory Visit: Payer: Self-pay | Admitting: Family Medicine

## 2015-02-16 ENCOUNTER — Encounter: Payer: Self-pay | Admitting: *Deleted

## 2015-02-21 DIAGNOSIS — Z08 Encounter for follow-up examination after completed treatment for malignant neoplasm: Secondary | ICD-10-CM | POA: Diagnosis not present

## 2015-02-21 DIAGNOSIS — L570 Actinic keratosis: Secondary | ICD-10-CM | POA: Diagnosis not present

## 2015-02-21 DIAGNOSIS — Z85820 Personal history of malignant melanoma of skin: Secondary | ICD-10-CM | POA: Diagnosis not present

## 2015-02-21 DIAGNOSIS — Z872 Personal history of diseases of the skin and subcutaneous tissue: Secondary | ICD-10-CM | POA: Diagnosis not present

## 2015-02-21 DIAGNOSIS — Z1283 Encounter for screening for malignant neoplasm of skin: Secondary | ICD-10-CM | POA: Diagnosis not present

## 2015-02-21 DIAGNOSIS — Z85828 Personal history of other malignant neoplasm of skin: Secondary | ICD-10-CM | POA: Diagnosis not present

## 2015-02-21 DIAGNOSIS — L821 Other seborrheic keratosis: Secondary | ICD-10-CM | POA: Diagnosis not present

## 2015-02-21 DIAGNOSIS — D485 Neoplasm of uncertain behavior of skin: Secondary | ICD-10-CM | POA: Diagnosis not present

## 2015-03-22 ENCOUNTER — Other Ambulatory Visit: Payer: Self-pay | Admitting: Family Medicine

## 2015-03-23 ENCOUNTER — Other Ambulatory Visit: Payer: Self-pay

## 2015-03-23 MED ORDER — ONETOUCH VERIO FLEX SYSTEM W/DEVICE KIT: 1.0000 | PACK | Freq: Every day | Status: DC

## 2015-03-23 MED ORDER — GLUCOSE BLOOD VI STRP: ORAL_STRIP | Status: DC

## 2015-03-23 MED ORDER — ONETOUCH LANCETS MISC: Status: DC

## 2015-03-23 NOTE — Telephone Encounter (Signed)
[facility] no longer is diabetic supply co and pt request onetouch verio flex meter, onetouch verio test strips and onetouch lancets sent to [facility]. Last annual exam 12/14/14. Notified pt done.

## 2015-03-30 ENCOUNTER — Encounter: Payer: Self-pay | Admitting: Internal Medicine

## 2015-03-30 ENCOUNTER — Telehealth: Payer: Self-pay | Admitting: Internal Medicine

## 2015-03-30 ENCOUNTER — Ambulatory Visit (INDEPENDENT_AMBULATORY_CARE_PROVIDER_SITE_OTHER): Payer: Medicare Other | Admitting: Internal Medicine

## 2015-03-30 VITALS — BP 146/80 | HR 70 | Temp 97.8°F | Wt 199.0 lb

## 2015-03-30 DIAGNOSIS — J069 Acute upper respiratory infection, unspecified: Secondary | ICD-10-CM | POA: Diagnosis not present

## 2015-03-30 DIAGNOSIS — B9789 Other viral agents as the cause of diseases classified elsewhere: Principal | ICD-10-CM

## 2015-03-30 MED ORDER — HYDROCODONE-HOMATROPINE 5-1.5 MG/5ML PO SYRP: 5.0000 mL | ORAL_SOLUTION | Freq: Three times a day (TID) | ORAL | Status: DC | PRN

## 2015-03-30 NOTE — Progress Notes (Signed)
Pre visit review using our clinic review tool, if applicable. No additional management support is needed unless otherwise documented below in the visit note. 

## 2015-03-30 NOTE — Progress Notes (Signed)
HPI  Pt presents to the clinic today with c/o ear fullness, runny nose, sore throat and cough. This started last night. He denies ear pain, ringing in the ears or decreased hearing. He is blowing clear mucous out of his nose. The cough is productive of grey "foamy" mucous. He denies fever, chills or body aches. He has used Flonase without any relief. He does have a history of chronic rhinitis. He does have DM2, last A1C was 6.5%. He is UTD on his flu and pneumonia vaccines. He has had sick contacts.  Review of Systems      Past Medical History  Diagnosis Date  . CAD (coronary artery disease)     with stents  . HTN (hypertension)   . HLD (hyperlipidemia)   . Diabetes mellitus type II   . GERD (gastroesophageal reflux disease)   . Chronic rhinitis     ENT [place]  . Ischemic heart disease   . MI (myocardial infarction) ([place]) 1995  . History of BPH   . Fatty liver 06/2011    by CT  . Diverticulosis 06/2011    by CT and colonoscopy  . Shingles rash 09/26/2012  . History of shingles     Family History  Problem Relation Age of Onset  . Kidney failure Mother   . Coronary artery disease Mother   . Heart failure Mother   . Hypertension Brother   . Heart attack Brother   . Alcohol abuse Brother   . Hypertension Brother   . Hypertension Sister     Social History   Social History  . Marital Status: Married    Spouse Name: N/A  . Number of Children: 5  . Years of Education: N/A   Occupational History  . Pastor    Social History Main Topics  . Smoking status: Never Smoker   . Smokeless tobacco: Never Used  . Alcohol Use: No  . Drug Use: No  . Sexual Activity: Not on file   Other Topics Concern  . 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 Morphine Sulfate     REACTION: Hallucinations  . Nitroglycerin     REACTION: Decreased BP  . Contrast Media [Iodinated Diagnostic Agents] Rash    Severe hives     Constitutional:  Denies headache, fatigue, fever or abrupt weight changes.  HEENT:  Positive ear fullness, runny nose, sore throat. Denies eye redness, eye pain, pressure behind the eyes, facial pain, nasal congestion, ear pain, ringing in the ears, wax buildup, or bloody nose. Respiratory: Positive cough. Denies difficulty breathing or shortness of breath.  Cardiovascular: Denies chest pain, chest tightness, palpitations or swelling in the hands or feet.   No other specific complaints in a complete review of systems (except as listed in HPI above).  Objective:   BP 146/80 mmHg  Pulse 70  Temp(Src) 97.8 F (36.6 C) (Oral)  Wt 199 lb (90.266 kg)  SpO2 98%  Wt Readings from Last 3 Encounters:  03/30/15 199 lb (90.266 kg)  12/14/14 194 lb 8 oz (88.225 kg)  11/21/14 198 lb 12 oz (90.152 kg)     General: Appears his stated age, in NAD. HEENT: Head: normal shape  and size, no sinus tenderness noted; Eyes: sclera white, no icterus, conjunctiva pink; Ears: Tm's gray and intact, normal light reflex; Nose: mucosa pink and moist, septum midline; Throat/Mouth: + PND. Teeth present, mucosa erythematous and moist, no exudate noted, no lesions or ulcerations noted.  Neck: No cervical lymphadenopathy.  Cardiovascular: Normal rate and rhythm. S1,S2 noted.  No murmur, rubs or gallops noted.  Pulmonary/Chest: Normal effort and positive vesicular breath sounds. No respiratory distress. No wheezes, rales or ronchi noted.      Assessment & Plan:   Upper Respiratory Infection:  Viral Get some rest and drink plenty of water Do salt water gargles for the sore throat Continue Flonase, add Allegra Rx for Hycodan cough syrup  RTC as needed or if symptoms persist.

## 2015-03-30 NOTE — Telephone Encounter (Signed)
Patient was seen today.  Patient's wife takes care of their 46 month old great grandchild and he wants to make sure that he can be around the baby while he's sick.

## 2015-03-30 NOTE — Telephone Encounter (Signed)
Pt is aware.  

## 2015-03-30 NOTE — Patient Instructions (Signed)
Upper Respiratory Infection, Adult Most upper respiratory infections (URIs) are a viral infection of the air passages leading to the lungs. A URI affects the nose, throat, and upper air passages. The most common type of URI is nasopharyngitis and is typically referred to as "the common cold." URIs run their course and usually go away on their own. Most of the time, a URI does not require medical attention, but sometimes a bacterial infection in the upper airways can follow a viral infection. This is called a secondary infection. Sinus and middle ear infections are common types of secondary upper respiratory infections. Bacterial pneumonia can also complicate a URI. A URI can worsen asthma and chronic obstructive pulmonary disease (COPD). Sometimes, these complications can require emergency medical care and may be life threatening.  CAUSES Almost all URIs are caused by viruses. A virus is a type of germ and can spread from one person to another.  RISKS FACTORS You may be at risk for a URI if:   You smoke.   You have chronic heart or lung disease.  You have a weakened defense (immune) system.   You are very young or very old.   You have nasal allergies or asthma.  You work in crowded or poorly ventilated areas.  You work in health care facilities or schools. SIGNS AND SYMPTOMS  Symptoms typically develop 2-3 days after you come in contact with a cold virus. Most viral URIs last 7-10 days. However, viral URIs from the influenza virus (flu virus) can last 14-18 days and are typically more severe. Symptoms may include:   Runny or stuffy (congested) nose.   Sneezing.   Cough.   Sore throat.   Headache.   Fatigue.   Fever.   Loss of appetite.   Pain in your forehead, behind your eyes, and over your cheekbones (sinus pain).  Muscle aches.  DIAGNOSIS  Your health care provider may diagnose a URI by:  Physical exam.  Tests to check that your symptoms are not due to  another condition such as:  Strep throat.  Sinusitis.  Pneumonia.  Asthma. TREATMENT  A URI goes away on its own with time. It cannot be cured with medicines, but medicines may be prescribed or recommended to relieve symptoms. Medicines may help:  Reduce your fever.  Reduce your cough.  Relieve nasal congestion. HOME CARE INSTRUCTIONS   Take medicines only as directed by your health care provider.   Gargle warm saltwater or take cough drops to comfort your throat as directed by your health care provider.  Use a warm mist humidifier or inhale steam from a shower to increase air moisture. This may make it easier to breathe.  Drink enough fluid to keep your urine clear or pale yellow.   Eat soups and other clear broths and maintain good nutrition.   Rest as needed.   Return to work when your temperature has returned to normal or as your health care provider advises. You may need to stay home longer to avoid infecting others. You can also use a face mask and careful hand washing to prevent spread of the virus.  Increase the usage of your inhaler if you have asthma.   Do not use any tobacco products, including cigarettes, chewing tobacco, or electronic cigarettes. If you need help quitting, ask your health care provider. PREVENTION  The best way to protect yourself from getting a cold is to practice good hygiene.   Avoid oral or hand contact with people with cold   symptoms.   Wash your hands often if contact occurs.  There is no clear evidence that vitamin C, vitamin E, echinacea, or exercise reduces the chance of developing a cold. However, it is always recommended to get plenty of rest, exercise, and practice good nutrition.  SEEK MEDICAL CARE IF:   You are getting worse rather than better.   Your symptoms are not controlled by medicine.   You have chills.  You have worsening shortness of breath.  You have brown or red mucus.  You have yellow or brown nasal  discharge.  You have pain in your face, especially when you bend forward.  You have a fever.  You have swollen neck glands.  You have pain while swallowing.  You have white areas in the back of your throat. SEEK IMMEDIATE MEDICAL CARE IF:   You have severe or persistent:  Headache.  Ear pain.  Sinus pain.  Chest pain.  You have chronic lung disease and any of the following:  Wheezing.  Prolonged cough.  Coughing up blood.  A change in your usual mucus.  You have a stiff neck.  You have changes in your:  Vision.  Hearing.  Thinking.  Mood. MAKE SURE YOU:   Understand these instructions.  Will watch your condition.  Will get help right away if you are not doing well or get worse.   This information is not intended to replace advice given to you by your health care provider. Make sure you discuss any questions you have with your health care provider.   Document Released: 07/31/2000 Document Revised: 06/21/2014 Document Reviewed: 05/12/2013 Elsevier Interactive Patient Education 2016 [employer]  

## 2015-03-30 NOTE — Telephone Encounter (Signed)
I thinks its okay. Just wash his hands before touching the baby and avoid coughing/sneezing around the baby if possible.

## 2015-04-01 ENCOUNTER — Ambulatory Visit
Admission: EM | Admit: 2015-04-01 | Discharge: 2015-04-01 | Disposition: A | Discharge status: Home / Self Care | Payer: Medicare Other | Attending: Family Medicine | Admitting: Family Medicine

## 2015-04-01 DIAGNOSIS — I2510 Atherosclerotic heart disease of native coronary artery without angina pectoris: Secondary | ICD-10-CM | POA: Diagnosis not present

## 2015-04-01 DIAGNOSIS — J40 Bronchitis, not specified as acute or chronic: Secondary | ICD-10-CM | POA: Diagnosis not present

## 2015-04-01 DIAGNOSIS — N400 Enlarged prostate without lower urinary tract symptoms: Secondary | ICD-10-CM | POA: Insufficient documentation

## 2015-04-01 DIAGNOSIS — K219 Gastro-esophageal reflux disease without esophagitis: Secondary | ICD-10-CM | POA: Insufficient documentation

## 2015-04-01 DIAGNOSIS — Z7984 Long term (current) use of oral hypoglycemic drugs: Secondary | ICD-10-CM | POA: Insufficient documentation

## 2015-04-01 DIAGNOSIS — E119 Type 2 diabetes mellitus without complications: Secondary | ICD-10-CM | POA: Insufficient documentation

## 2015-04-01 DIAGNOSIS — I10 Essential (primary) hypertension: Secondary | ICD-10-CM | POA: Diagnosis not present

## 2015-04-01 DIAGNOSIS — R05 Cough: Secondary | ICD-10-CM | POA: Insufficient documentation

## 2015-04-01 DIAGNOSIS — J0100 Acute maxillary sinusitis, unspecified: Secondary | ICD-10-CM | POA: Insufficient documentation

## 2015-04-01 DIAGNOSIS — Z7982 Long term (current) use of aspirin: Secondary | ICD-10-CM | POA: Diagnosis not present

## 2015-04-01 DIAGNOSIS — E785 Hyperlipidemia, unspecified: Secondary | ICD-10-CM | POA: Insufficient documentation

## 2015-04-01 DIAGNOSIS — I252 Old myocardial infarction: Secondary | ICD-10-CM | POA: Insufficient documentation

## 2015-04-01 DIAGNOSIS — R059 Cough, unspecified: Secondary | ICD-10-CM

## 2015-04-01 DIAGNOSIS — J069 Acute upper respiratory infection, unspecified: Secondary | ICD-10-CM | POA: Diagnosis present

## 2015-04-01 LAB — RAPID STREP SCREEN (MED CTR MEBANE ONLY): Streptococcus, Group A Screen (Direct): NEGATIVE

## 2015-04-01 MED ORDER — AMOXICILLIN-POT CLAVULANATE 500-125 MG PO TABS
1.0000 | ORAL_TABLET | Freq: Three times a day (TID) | ORAL | Status: DC
Start: 2015-04-01 — End: 2015-10-05

## 2015-04-01 NOTE — ED Provider Notes (Signed)
CSN: [id number]     Arrival date & time 04/01/15  1035 History   None    Chief Complaint  Patient presents with  . URI    Pt reports sx started on Monday. Cough, sore throat, very congested. Grey phlegm. Went to PCP and diagnosed with viral illness but no better. Denies fever. Denies pain other than sore throat.    (Consider location/radiation/quality/duration/timing/severity/associated sxs/prior Treatment) Patient is a 81 y.o. male presenting with URI.  URI Presenting symptoms: congestion, cough, fatigue, rhinorrhea and sore throat   Severity:  Moderate Onset quality:  Sudden Duration:  1 week Timing:  Constant Progression:  Worsening Chronicity:  New Relieved by:  Nothing Ineffective treatments:  OTC medications Associated symptoms: sinus pain   Associated symptoms: no neck pain and no wheezing   Risk factors: chronic cardiac disease and diabetes mellitus     Past Medical History  Diagnosis Date  . CAD (coronary artery disease)     with stents  . HTN (hypertension)   . HLD (hyperlipidemia)   . Diabetes mellitus type II   . GERD (gastroesophageal reflux disease)   . Chronic rhinitis     ENT [place]  . Ischemic heart disease   . MI (myocardial infarction) ([place]) 1995  . History of BPH   . Fatty liver 06/2011    by CT  . Diverticulosis 06/2011    by CT and colonoscopy  . Shingles rash 09/26/2012  . History of shingles    Past Surgical History  Procedure Laterality Date  . Cholecystectomy  1985  . Coronary artery bypass graft  05/1993    x6 MI Attemped angioplasty  . Inguinal hernia repair  11/2000    and ventral hernia repair  . Cystoscopy  10/03/08    Normal-Dr. Bernardo Heater  . Ankle surgery  03/2002    Left ankle fracture  . Carotid stent  07/2009    RCA stent  . Colonoscopy  03/2005    diverticulosis, tortuous colon with looping [name])   Family History  Problem Relation Age of Onset  . Kidney failure Mother   . Coronary artery disease Mother   . Heart  failure Mother   . Hypertension Brother   . Heart attack Brother   . Alcohol abuse Brother   . Hypertension Brother   . Hypertension Sister    Social History  Substance Use Topics  . Smoking status: Never Smoker   . Smokeless tobacco: Never Used  . Alcohol Use: No    Review of Systems  Constitutional: Positive for fatigue.  HENT: Positive for congestion, rhinorrhea and sore throat.   Respiratory: Positive for cough. Negative for wheezing.   Musculoskeletal: Negative for neck pain.    Allergies  Morphine sulfate; Nitroglycerin; and Contrast media  Home Medications   Prior to Admission medications   Medication Sig Start Date End Date Taking? Authorizing Provider  amLODipine (NORVASC) 5 MG tablet TAKE ONE TABLET DAILY 03/21/14  Yes Ria Bush, MD  aspirin 81 MG tablet Take 81 mg by mouth daily.     Yes Historical Provider, MD  Blood Glucose Monitoring Suppl ([facility]) w/Device KIT 1 Device by Does not apply route daily. Test blood sugar once daily and as directed. Dx L27.51 03/23/15  Yes Ria Bush, MD  Cholecalciferol (VITAMIN D3) 1000 UNITS CAPS Take 1 capsule (1,000 Units total) by mouth daily. 12/14/14  Yes Ria Bush, MD  Cranberry 500 MG CAPS Take 4,200 mg by mouth daily.    Yes  Historical Provider, MD  finasteride (PROSCAR) 5 MG tablet TAKE 1 TABLET BY MOUTH DAILY 12/23/14  Yes Ria Bush, MD  fluticasone [facility]) 50 MCG/ACT nasal spray Place 2 sprays into both nostrils daily. 09/04/14  Yes Jan Fireman, PA-C  furosemide (LASIX) 20 MG tablet TAKE 1 TABLET (20 MG TOTAL) BY MOUTH DAILY. 11/15/14  Yes Ria Bush, MD  gabapentin (NEURONTIN) 300 MG capsule Take 1 capsule (300 mg total) by mouth at bedtime. May increase to twice daily after 1 week if tolerated. 11/08/14  Yes Ria Bush, MD  glucose blood (ONETOUCH VERIO) test strip Check blood sugar once daily and as instructed. Dx F63.84 03/23/15  Yes Ria Bush, MD   hydrochlorothiazide (HYDRODIURIL) 25 MG tablet TAKE 1 TABLET (25 MG TOTAL) BY MOUTH DAILY. 03/23/15  Yes Ria Bush, MD  HYDROcodone-homatropine [facility]) 5-1.5 MG/5ML syrup Take 5 mLs by mouth every 8 (eight) hours as needed for cough. 03/30/15  Yes Jearld Fenton, NP  losartan (COZAAR) 25 MG tablet TAKE 1 TABLET (25 MG TOTAL) BY MOUTH DAILY. 01/17/15  Yes Ria Bush, MD  metFORMIN (GLUCOPHAGE) 500 MG tablet TAKE 1 TABLET (500 MG TOTAL) BY MOUTH 2 (TWO) TIMES DAILY WITH A MEAL. 01/20/15  Yes Ria Bush, MD  metoprolol tartrate (LOPRESSOR) 25 MG tablet TAKE 1/2 TABLET BY MOUTH 2 TIMES DAY 03/22/15  Yes Ria Bush, MD  Multiple Vitamin (MULTIVITAMIN) tablet Take 1 tablet by mouth daily.   Yes Historical Provider, MD  ONE TOUCH LANCETS MISC Check blood sugar once daily and as instructed. Dx Y65.99 03/23/15  Yes Ria Bush, MD  pravastatin (PRAVACHOL) 40 MG tablet TAKE 1 TABLET (40 MG TOTAL) BY MOUTH AT BEDTIME. 01/30/15  Yes Ria Bush, MD  traZODone (DESYREL) 100 MG tablet TAKE 1 TABLET (100 MG TOTAL) BY MOUTH AT BEDTIME. 05/05/14  Yes Ria Bush, MD  vitamin B-12 (CYANOCOBALAMIN) 250 MCG tablet Take 250 mcg by mouth daily.   Yes Historical Provider, MD  amoxicillin-clavulanate (AUGMENTIN) 500-125 MG tablet Take 1 tablet (500 mg total) by mouth every 8 (eight) hours. 04/01/15   Norval Gable, MD   Meds Ordered and Administered this Visit  Medications - No data to display  BP 132/79 mmHg  Pulse 79  Temp(Src) 99.9 F (37.7 C) (Tympanic)  Resp 20  Ht '5\' 8"'$  (1.727 m)  Wt 197 lb (89.359 kg)  BMI 29.96 kg/m2  SpO2 100% No data found.   Physical Exam  Constitutional: He appears well-developed and well-nourished. No distress.  HENT:  Head: Normocephalic and atraumatic.  Right Ear: Tympanic membrane, external ear and ear canal normal.  Left Ear: Tympanic membrane, external ear and ear canal normal.  Nose: Mucosal edema and rhinorrhea present. Right sinus  exhibits maxillary sinus tenderness and frontal sinus tenderness. Left sinus exhibits maxillary sinus tenderness and frontal sinus tenderness.  Mouth/Throat: Uvula is midline, oropharynx is clear and moist and mucous membranes are normal. No oropharyngeal exudate or tonsillar abscesses.  Eyes: Conjunctivae and EOM are normal. Pupils are equal, round, and reactive to light. Right eye exhibits no discharge. Left eye exhibits no discharge. No scleral icterus.  Neck: Normal range of motion. Neck supple. No tracheal deviation present. No thyromegaly present.  Cardiovascular: Normal rate, regular rhythm and normal heart sounds.   Pulmonary/Chest: Effort normal and breath sounds normal. No stridor. No respiratory distress. He has no wheezes. He has no rales. He exhibits no tenderness.  Lymphadenopathy:    He has no cervical adenopathy.  Neurological: He is alert.  Skin: Skin is warm and dry. No rash noted. He is not diaphoretic.  Nursing note and vitals reviewed.   ED Course  Procedures (including critical care time)  Labs Review Labs Reviewed  RAPID STREP SCREEN (NOT AT [facility])  CULTURE, GROUP A STREP [facility])    Imaging Review No results found.   Visual Acuity Review  Right Eye Distance:   Left Eye Distance:   Bilateral Distance:    Right Eye Near:   Left Eye Near:    Bilateral Near:         MDM   1. Acute maxillary sinusitis, recurrence not specified   2. Cough   3. Bronchitis    Discharge Medication List as of 04/01/2015  1:42 PM    START taking these medications   Details  amoxicillin-clavulanate (AUGMENTIN) 500-125 MG tablet Take 1 tablet (500 mg total) by mouth every 8 (eight) hours., Starting 04/01/2015, Until Discontinued, Normal       1. diagnosis reviewed with patient 2. rx as per orders above; reviewed possible side effects, interactions, risks and benefits  3. Recommend supportive treatment with otc flonase, increased fluids, otc analgesics 4. Follow-up prn if  symptoms worsen or don't improve    Norval Gable, MD 04/01/15 1346

## 2015-04-03 DIAGNOSIS — L570 Actinic keratosis: Secondary | ICD-10-CM | POA: Diagnosis not present

## 2015-04-03 LAB — CULTURE, GROUP A STREP (THRC)

## 2015-06-10 ENCOUNTER — Other Ambulatory Visit: Payer: Self-pay | Admitting: Family Medicine

## 2015-06-22 ENCOUNTER — Ambulatory Visit
Admission: EM | Admit: 2015-06-22 | Discharge: 2015-06-22 | Disposition: A | Discharge status: Home / Self Care | Payer: Medicare Other | Attending: Family Medicine | Admitting: Family Medicine

## 2015-06-22 ENCOUNTER — Encounter: Payer: Self-pay | Admitting: *Deleted

## 2015-06-22 ENCOUNTER — Other Ambulatory Visit: Payer: Self-pay | Admitting: Family Medicine

## 2015-06-22 DIAGNOSIS — R590 Localized enlarged lymph nodes: Secondary | ICD-10-CM

## 2015-06-22 DIAGNOSIS — L0232 Furuncle of buttock: Secondary | ICD-10-CM

## 2015-06-22 DIAGNOSIS — R599 Enlarged lymph nodes, unspecified: Secondary | ICD-10-CM

## 2015-06-22 MED ORDER — MUPIROCIN 2 % EX OINT: 1.0000 "application " | TOPICAL_OINTMENT | Freq: Three times a day (TID) | CUTANEOUS | Status: DC

## 2015-06-22 MED ORDER — SULFAMETHOXAZOLE-TRIMETHOPRIM 800-160 MG PO TABS: 1.0000 | ORAL_TABLET | Freq: Two times a day (BID) | ORAL | Status: DC

## 2015-06-22 NOTE — ED Notes (Signed)
Patient started noticing a abscess forming on his right lower buttock 3 days ago. Patient also reports a possible right inguinal hernia.

## 2015-06-22 NOTE — Telephone Encounter (Signed)
Ok to refill 

## 2015-06-22 NOTE — ED Provider Notes (Signed)
CSN: [id number]     Arrival date & time 06/22/15  0900 History   First MD Initiated Contact with Patient 06/22/15 1029    Nurses notes were reviewed.  Chief Complaint  Patient presents with  . Abscess     Patient reports last 3 or 4 days dosing lesion on his right lower buttocks. States his wife reports that his symptoms of his Larena Glassman or not to it. He was aware of 15 her having drainage earlier.  History of coronary artery disease hypertension hyperlipidemia and diabetes and GERD. He is allergic to morphine, nitroglycerin contrast dye. He's had multiple surgeries based ever smoked before. Nothing pertinent family history pertaining to this visit.       (Consider location/radiation/quality/duration/timing/severity/associated sxs/prior Treatment) Patient is a 81 y.o. male presenting with abscess. The history is provided by the patient. No language interpreter was used.  Abscess Location:  Leg Leg abscess location:  R upper leg Abscess quality: induration, redness, warmth and weeping   Progression:  Worsening Chronicity:  New Context: diabetes   Context: not insect bite/sting and not skin injury   Relieved by:  Nothing Worsened by:  Nothing tried Risk factors: no family hx of MRSA, no hx of MRSA and no prior abscess     Past Medical History  Diagnosis Date  . CAD (coronary artery disease)     with stents  . HTN (hypertension)   . HLD (hyperlipidemia)   . Diabetes mellitus type II   . GERD (gastroesophageal reflux disease)   . Chronic rhinitis     ENT [place]  . Ischemic heart disease   . MI (myocardial infarction) ([place]) 1995  . History of BPH   . Fatty liver 06/2011    by CT  . Diverticulosis 06/2011    by CT and colonoscopy  . Shingles rash 09/26/2012  . History of shingles    Past Surgical History  Procedure Laterality Date  . Cholecystectomy  1985  . Coronary artery bypass graft  05/1993    x6 MI Attemped angioplasty  . Inguinal hernia repair  11/2000    and  ventral hernia repair  . Cystoscopy  10/03/08    Normal-Dr. Bernardo Heater  . Ankle surgery  03/2002    Left ankle fracture  . Carotid stent  07/2009    RCA stent  . Colonoscopy  03/2005    diverticulosis, tortuous colon with looping [name])   Family History  Problem Relation Age of Onset  . Kidney failure Mother   . Coronary artery disease Mother   . Heart failure Mother   . Hypertension Brother   . Heart attack Brother   . Alcohol abuse Brother   . Hypertension Brother   . Hypertension Sister    Social History  Substance Use Topics  . Smoking status: Never Smoker   . Smokeless tobacco: Never Used  . Alcohol Use: No    Review of Systems  All other systems reviewed and are negative.   Allergies  Morphine sulfate; Nitroglycerin; and Contrast media  Home Medications   Prior to Admission medications   Medication Sig Start Date End Date Taking? Authorizing Provider  amLODipine (NORVASC) 5 MG tablet TAKE ONE TABLET DAILY 03/21/14  Yes Ria Bush, MD  aspirin 81 MG tablet Take 81 mg by mouth daily.     Yes Historical Provider, MD  Blood Glucose Monitoring Suppl ([place]) w/Device KIT 1 Device by Does not apply route daily. Test blood sugar once daily and as directed.  Dx K46.28 03/23/15  Yes Ria Bush, MD  Cholecalciferol (VITAMIN D3) 1000 UNITS CAPS Take 1 capsule (1,000 Units total) by mouth daily. 12/14/14  Yes Ria Bush, MD  Cranberry 500 MG CAPS Take 4,200 mg by mouth daily.    Yes Historical Provider, MD  finasteride (PROSCAR) 5 MG tablet TAKE 1 TABLET BY MOUTH DAILY 12/23/14  Yes Ria Bush, MD  furosemide (LASIX) 20 MG tablet TAKE 1 TABLET (20 MG TOTAL) BY MOUTH DAILY. 11/15/14  Yes Ria Bush, MD  gabapentin (NEURONTIN) 300 MG capsule Take 1 capsule (300 mg total) by mouth 2 (two) times daily. 06/10/15  Yes Ria Bush, MD  glucose blood (ONETOUCH VERIO) test strip Check blood sugar once daily and as instructed. Dx M38.17  03/23/15  Yes Ria Bush, MD  hydrochlorothiazide (HYDRODIURIL) 25 MG tablet TAKE 1 TABLET (25 MG TOTAL) BY MOUTH DAILY. 03/23/15  Yes Ria Bush, MD  losartan (COZAAR) 25 MG tablet TAKE 1 TABLET (25 MG TOTAL) BY MOUTH DAILY. 01/17/15  Yes Ria Bush, MD  metFORMIN (GLUCOPHAGE) 500 MG tablet TAKE 1 TABLET (500 MG TOTAL) BY MOUTH 2 (TWO) TIMES DAILY WITH A MEAL. 01/20/15  Yes Ria Bush, MD  metoprolol tartrate (LOPRESSOR) 25 MG tablet TAKE 1/2 TABLET BY MOUTH 2 TIMES DAY 03/22/15  Yes Ria Bush, MD  Multiple Vitamin (MULTIVITAMIN) tablet Take 1 tablet by mouth daily.   Yes Historical Provider, MD  ONE TOUCH LANCETS MISC Check blood sugar once daily and as instructed. Dx R11.65 03/23/15  Yes Ria Bush, MD  pravastatin (PRAVACHOL) 40 MG tablet TAKE 1 TABLET (40 MG TOTAL) BY MOUTH AT BEDTIME. 01/30/15  Yes Ria Bush, MD  traZODone (DESYREL) 100 MG tablet TAKE 1 TABLET (100 MG TOTAL) BY MOUTH AT BEDTIME. 05/05/14  Yes Ria Bush, MD  amoxicillin-clavulanate (AUGMENTIN) 500-125 MG tablet Take 1 tablet (500 mg total) by mouth every 8 (eight) hours. 04/01/15   Norval Gable, MD  fluticasone (FLONASE) 50 MCG/ACT nasal spray Place 2 sprays into both nostrils daily. 09/04/14   Jan Fireman, PA-C  HYDROcodone-homatropine [facility]) 5-1.5 MG/5ML syrup Take 5 mLs by mouth every 8 (eight) hours as needed for cough. 03/30/15   Jearld Fenton, NP  mupirocin ointment (BACTROBAN) 2 % Apply 1 application topically 3 (three) times daily. 06/22/15   Frederich Cha, MD  sulfamethoxazole-trimethoprim (BACTRIM DS,SEPTRA DS) 800-160 MG tablet Take 1 tablet by mouth 2 (two) times daily. 06/22/15   Frederich Cha, MD  vitamin B-12 (CYANOCOBALAMIN) 250 MCG tablet Take 250 mcg by mouth daily.    Historical Provider, MD   Meds Ordered and Administered this Visit  Medications - No data to display  BP 174/65 mmHg  Pulse 56  Temp(Src) 97.8 F (36.6 C) (Oral)  Resp 18  Ht _0  (1.727 m)  Wt 195 lb  (88.451 kg)  BMI 29.66 kg/m2  SpO2 100% No data found.   Physical Exam  Constitutional: He is oriented to person, place, and time. He appears well-developed and well-nourished.  HENT:  Head: Normocephalic and atraumatic.  Genitourinary:     He has 2 lesions/abscesses say the lower right buttocks away from the left. Both were indurated but not fluctuant or ready for opening. He also has an area of soreness on the right inguinal area which may be a lymph node reacting to the abscess or an early abscess there as well.  Musculoskeletal: Normal range of motion. He exhibits tenderness.  Neurological: He is alert and oriented to person, place, and time.  Skin: Rash noted.  Psychiatric: He has a normal mood and affect.  Vitals reviewed.   ED Course  Procedures (including critical care time)  Labs Review Labs Reviewed - No data to display  Imaging Review No results found.   Visual Acuity Review  Right Eye Distance:   Left Eye Distance:   Bilateral Distance:    Right Eye Near:   Left Eye Near:    Bilateral Near:         MDM   1. Boil of buttock   2. Inguinal adenopathy    At this time it looks as if there was some drainage coming from both of the abscess on his buttocks. This may have kept these areas from becoming fluctuant to the point of requiring incision and drainage fluctuant. Would not recommend excision and drainage this time but we'll place him on Septra DS 1 tablet twice a day and Bactrim and ointment 3 times a day. Explained to him that if this gets worse over the next 48-72 hours he may need to return for incision and drainage at that time. He is agreeable to this treatment plan. If the inguinal tenderness continues elevated, that but I think it health will also respond to the antibiotic he is going to be placed on. He was examined for right inguinal hernia. This is not. Be hernia at this time and with a history of it only occurring once he developed the abscesses  is doubtful that this is a hernia..      Note: This dictation was prepared with Dragon dictation along with smaller phrase technology. Any transcriptional errors that result from this process are unintentional.   Frederich Cha, MD 06/22/15 1114

## 2015-06-22 NOTE — Discharge Instructions (Signed)
Abscess An abscess (boil or furuncle) is an infected area on or under the skin. This area is filled with yellowish-white fluid (pus) and other material (debris). HOME CARE   Only take medicines as told by your doctor.  If you were given antibiotic medicine, take it as directed. Finish the medicine even if you start to feel better.  If gauze is used, follow your doctor's directions for changing the gauze.  To avoid spreading the infection:  Keep your abscess covered with a bandage.  Wash your hands well.  Do not share personal care items, towels, or whirlpools with others.  Avoid skin contact with others.  Keep your skin and clothes clean around the abscess.  Keep all doctor visits as told. GET HELP RIGHT AWAY IF:   You have more pain, puffiness (swelling), or redness in the wound site.  You have more fluid or blood coming from the wound site.  You have muscle aches, chills, or you feel sick.  You have a fever. MAKE SURE YOU:   Understand these instructions.  Will watch your condition.  Will get help right away if you are not doing well or get worse.   This information is not intended to replace advice given to you by your health care provider. Make sure you discuss any questions you have with your health care provider.   Document Released: 07/24/2007 Document Revised: 08/06/2011 Document Reviewed: 04/20/2011 Elsevier Interactive Patient Education 2016 Elsevier [employer]  Lymphadenopathy Lymphadenopathy refers to swollen or enlarged lymph glands, also called lymph nodes. Lymph glands are part of your body's defense (immune) system, which protects the body from infections, germs, and diseases. Lymph glands are found in many locations in your body, including the neck, underarm, and groin.  Many things can cause lymph glands to become enlarged. When your immune system responds to germs, such as viruses or bacteria, infection-fighting cells and fluid build up. This causes the  glands to grow in size. Usually, this is not something to worry about. The swelling and any soreness often go away without treatment. However, swollen lymph glands can also be caused by a number of diseases. Your health care provider may do various tests to help determine the cause. If the cause of your swollen lymph glands cannot be found, it is important to monitor your condition to make sure the swelling goes away. HOME CARE INSTRUCTIONS Watch your condition for any changes. The following actions may help to lessen any discomfort you are feeling:  Get plenty of rest.  Take medicines only as directed by your health care provider. Your health care provider may recommend over-the-counter medicines for pain.  Apply moist heat compresses to the site of swollen lymph nodes as directed by your health care provider. This can help reduce any pain.  Check your lymph nodes daily for any changes.  Keep all follow-up visits as directed by your health care provider. This is important. SEEK MEDICAL CARE IF:  Your lymph nodes are still swollen after 2 weeks.  Your swelling increases or spreads to other areas.  Your lymph nodes are hard, seem fixed to the skin, or are growing rapidly.  Your skin over the lymph nodes is red and inflamed.  You have a fever.  You have chills.  You have fatigue.  You develop a sore throat.  You have abdominal pain.  You have weight loss.  You have night sweats. SEEK IMMEDIATE MEDICAL CARE IF:  You notice fluid leaking from the area of the enlarged  lymph node.  You have severe pain in any area of your body.  You have chest pain.  You have shortness of breath.   This information is not intended to replace advice given to you by your health care provider. Make sure you discuss any questions you have with your health care provider.   Document Released: 11/14/2007 Document Revised: 02/25/2014 Document Reviewed: 09/09/2013 Elsevier Interactive Patient  Education [employer].

## 2015-07-11 DIAGNOSIS — E119 Type 2 diabetes mellitus without complications: Secondary | ICD-10-CM | POA: Diagnosis not present

## 2015-07-11 LAB — HM DIABETES EYE EXAM

## 2015-07-13 ENCOUNTER — Encounter: Payer: Self-pay | Admitting: Family Medicine

## 2015-09-27 DIAGNOSIS — Z872 Personal history of diseases of the skin and subcutaneous tissue: Secondary | ICD-10-CM | POA: Diagnosis not present

## 2015-09-27 DIAGNOSIS — D485 Neoplasm of uncertain behavior of skin: Secondary | ICD-10-CM | POA: Diagnosis not present

## 2015-09-27 DIAGNOSIS — L570 Actinic keratosis: Secondary | ICD-10-CM | POA: Diagnosis not present

## 2015-09-27 DIAGNOSIS — Z08 Encounter for follow-up examination after completed treatment for malignant neoplasm: Secondary | ICD-10-CM | POA: Diagnosis not present

## 2015-09-27 DIAGNOSIS — Z85820 Personal history of malignant melanoma of skin: Secondary | ICD-10-CM | POA: Diagnosis not present

## 2015-09-27 DIAGNOSIS — Z85828 Personal history of other malignant neoplasm of skin: Secondary | ICD-10-CM | POA: Diagnosis not present

## 2015-09-27 DIAGNOSIS — L720 Epidermal cyst: Secondary | ICD-10-CM | POA: Diagnosis not present

## 2015-09-27 DIAGNOSIS — Z1283 Encounter for screening for malignant neoplasm of skin: Secondary | ICD-10-CM | POA: Diagnosis not present

## 2015-10-02 ENCOUNTER — Ambulatory Visit: Payer: Medicare Other | Admitting: Family Medicine

## 2015-10-05 ENCOUNTER — Encounter: Payer: Self-pay | Admitting: Family Medicine

## 2015-10-05 ENCOUNTER — Ambulatory Visit (INDEPENDENT_AMBULATORY_CARE_PROVIDER_SITE_OTHER): Payer: Medicare Other | Admitting: Family Medicine

## 2015-10-05 ENCOUNTER — Ambulatory Visit: Payer: Medicare Other | Admitting: Family Medicine

## 2015-10-05 VITALS — BP 146/82 | HR 56 | Temp 97.9°F | Wt 196.5 lb

## 2015-10-05 DIAGNOSIS — E1142 Type 2 diabetes mellitus with diabetic polyneuropathy: Secondary | ICD-10-CM | POA: Diagnosis not present

## 2015-10-05 DIAGNOSIS — G4700 Insomnia, unspecified: Secondary | ICD-10-CM

## 2015-10-05 DIAGNOSIS — G629 Polyneuropathy, unspecified: Secondary | ICD-10-CM | POA: Insufficient documentation

## 2015-10-05 DIAGNOSIS — G6289 Other specified polyneuropathies: Secondary | ICD-10-CM

## 2015-10-05 DIAGNOSIS — E1140 Type 2 diabetes mellitus with diabetic neuropathy, unspecified: Secondary | ICD-10-CM | POA: Insufficient documentation

## 2015-10-05 MED ORDER — TRAZODONE HCL 50 MG PO TABS: 50.0000 mg | ORAL_TABLET | Freq: Every evening | ORAL | 1 refills | Status: DC | PRN

## 2015-10-05 NOTE — Patient Instructions (Signed)
One option would be to perform nerve conduction study to further evaluation peripheral neuropathy. Lab work today to rule out other causes of neuropathy. Change gabapentin to 300mg  at lunch and dinner Cut trazodone down to 50mg  nightly to help sleep.

## 2015-10-05 NOTE — Progress Notes (Signed)
BP (!) 146/82   Pulse (!) 56   Temp 97.9 F (36.6 C) (Oral)   Wt 196 lb 8 oz (89.1 kg)   BMI 29.88 kg/m    CC: discuss periph neuropathy Subjective:    Patient ID: Aaron Stafford, male    DOB: March 24, 1934, 81 y.o.   MRN: [id number]  HPI: EDMUND HOLCOMB is a 81 y.o. male presenting on 10/05/2015 for Peripheral Neuropathy   1-2 mo h/o worsening pain in feet. When he removes shoes, feet feel like "they're on fire" and toes feel swollen "ballooning like they're going to explode" along with tingling/burning and electrical shock. Currently having shooting pain down toes. Taking gabapentin '300mg'$  twice daily. Predominant pain is at night time when trying to sleep. Now also noticing finger numbness. No calf pain, no pain with walking. No back pain. No pedal edema.   Trazodone '100mg'$  overly sedating so stopped over the past week.  Last night took tylenol as well.  Denies alcohol use. Non smoker.   Lab Results  Component Value Date   HGBA1C 6.5 12/07/2014   Diabetic Foot Exam - Simple   Simple Foot Form Diabetic Foot exam was performed with the following findings:  Yes 10/05/2015  3:27 PM  Visual Inspection No deformities, no ulcerations, no other skin breakdown bilaterally:  Yes See comments:  Yes Sensation Testing See comments:  Yes Pulse Check See comments:  Yes Comments Diminished DP bilaterally 1+ Diminished sensation to monofilament testing Onychomycotic nails.      Relevant past medical, surgical, family and social history reviewed and updated as indicated. Interim medical history since our last visit reviewed. Allergies and medications reviewed and updated. Current Outpatient Prescriptions on File Prior to Visit  Medication Sig  . amLODipine (NORVASC) 5 MG tablet TAKE ONE TABLET DAILY  . aspirin 81 MG tablet Take 81 mg by mouth daily.    . Blood Glucose Monitoring Suppl ([place]) w/Device KIT 1 Device by Does not apply route daily. Test blood sugar  once daily and as directed. Dx T51.76  . Cholecalciferol (VITAMIN D3) 1000 UNITS CAPS Take 1 capsule (1,000 Units total) by mouth daily.  . Cranberry 500 MG CAPS Take 4,200 mg by mouth daily.   . finasteride (PROSCAR) 5 MG tablet TAKE 1 TABLET BY MOUTH DAILY  . fluticasone (FLONASE) 50 MCG/ACT nasal spray Place 2 sprays into both nostrils daily.  . furosemide (LASIX) 20 MG tablet TAKE 1 TABLET (20 MG TOTAL) BY MOUTH DAILY.  Marland Kitchen gabapentin (NEURONTIN) 300 MG capsule Take 1 capsule (300 mg total) by mouth 2 (two) times daily.  Marland Kitchen glucose blood (ONETOUCH VERIO) test strip Check blood sugar once daily and as instructed. Dx H60.73  . hydrochlorothiazide (HYDRODIURIL) 25 MG tablet TAKE 1 TABLET (25 MG TOTAL) BY MOUTH DAILY.  Marland Kitchen losartan (COZAAR) 25 MG tablet TAKE 1 TABLET (25 MG TOTAL) BY MOUTH DAILY.  . metFORMIN (GLUCOPHAGE) 500 MG tablet TAKE 1 TABLET (500 MG TOTAL) BY MOUTH 2 (TWO) TIMES DAILY WITH A MEAL.  . metoprolol tartrate (LOPRESSOR) 25 MG tablet TAKE 1/2 TABLET BY MOUTH 2 TIMES DAY  . Multiple Vitamin (MULTIVITAMIN) tablet Take 1 tablet by mouth daily.  . ONE TOUCH LANCETS MISC Check blood sugar once daily and as instructed. Dx E11.42  . pravastatin (PRAVACHOL) 40 MG tablet TAKE 1 TABLET (40 MG TOTAL) BY MOUTH AT BEDTIME.  . vitamin B-12 (CYANOCOBALAMIN) 250 MCG tablet Take 250 mcg by mouth daily.   No current facility-administered  medications on file prior to visit.     Review of Systems Per HPI unless specifically indicated in ROS section     Objective:    BP (!) 146/82   Pulse (!) 56   Temp 97.9 F (36.6 C) (Oral)   Wt 196 lb 8 oz (89.1 kg)   BMI 29.88 kg/m   Wt Readings from Last 3 Encounters:  10/05/15 196 lb 8 oz (89.1 kg)  06/22/15 195 lb (88.5 kg)  04/01/15 197 lb (89.4 kg)    Physical Exam  Constitutional: He is oriented to person, place, and time. He appears well-developed and well-nourished. No distress.  Musculoskeletal: He exhibits no edema.  See HPI for  diabetic foot exam 1+ DP bliaterally  Neurological: He is alert and oriented to person, place, and time.  Skin: Skin is warm and dry. No rash noted.  Vitals reviewed.  Results for orders placed or performed in visit on 07/13/15  HM DIABETES EYE EXAM  Result Value Ref Range   HM Diabetic Eye Exam No Retinopathy No Retinopathy      Assessment & Plan:   Problem List Items Addressed This Visit    INSOMNIA    Decrease trazodone to '50mg'$  nightly given persistent effects the next morning.       Peripheral neuropathy (HCC) - Primary    Worsening neuropathic pain endorsed over last 1-2 months. No EtOH use. Check labs including B12, TSH, CBC, CMP, and SPEP. If unrevealing, likely diabetic neuropathy. Discussed gabapentin use, recommended change to earlier in the day to avoid next AM sedation.  Discussed possible NCS.  Diminished distal pulses, consider arterial circulation evaluation.       Relevant Medications   traZODone (DESYREL) 50 MG tablet   Other Relevant Orders   Vitamin B12   Folate   Comprehensive metabolic panel   TSH   CBC with Differential/Platelet   Serum protein electrophoresis with reflex   Type 2 diabetes, controlled, with peripheral neuropathy ([place])    See above. Check A1c today.      Relevant Medications   traZODone (DESYREL) 50 MG tablet   Other Relevant Orders   TSH   Hemoglobin A1c    Other Visit Diagnoses   None.      Follow up plan: No Follow-up on file.  Ria Bush, MD

## 2015-10-05 NOTE — Assessment & Plan Note (Signed)
Decrease trazodone to 50mg  nightly given persistent effects the next morning.

## 2015-10-05 NOTE — Assessment & Plan Note (Signed)
See above. Check A1c today.

## 2015-10-05 NOTE — Assessment & Plan Note (Signed)
Worsening neuropathic pain endorsed over last 1-2 months. No EtOH use. Check labs including B12, TSH, CBC, CMP, and SPEP. If unrevealing, likely diabetic neuropathy. Discussed gabapentin use, recommended change to earlier in the day to avoid next AM sedation.  Discussed possible NCS.  Diminished distal pulses, consider arterial circulation evaluation.

## 2015-10-05 NOTE — Progress Notes (Signed)
Pre visit review using our clinic review tool, if applicable. No additional management support is needed unless otherwise documented below in the visit note. 

## 2015-10-06 LAB — CBC WITH DIFFERENTIAL/PLATELET
BASOS PCT: 0.6 % (ref 0.0–3.0)
Basophils Absolute: 0 10*3/uL (ref 0.0–0.1)
EOS PCT: 2.4 % (ref 0.0–5.0)
Eosinophils Absolute: 0.2 10*3/uL (ref 0.0–0.7)
HEMATOCRIT: 44.3 % (ref 39.0–52.0)
Hemoglobin: 15.2 g/dL (ref 13.0–17.0)
LYMPHS PCT: 25 % (ref 12.0–46.0)
Lymphs Abs: 2 10*3/uL (ref 0.7–4.0)
MCHC: 34.3 g/dL (ref 30.0–36.0)
MCV: 90 fl (ref 78.0–100.0)
MONOS PCT: 8.1 % (ref 3.0–12.0)
Monocytes Absolute: 0.6 10*3/uL (ref 0.1–1.0)
NEUTROS ABS: 5.1 10*3/uL (ref 1.4–7.7)
Neutrophils Relative %: 63.9 % (ref 43.0–77.0)
PLATELETS: 216 10*3/uL (ref 150.0–400.0)
RBC: 4.92 Mil/uL (ref 4.22–5.81)
RDW: 13.5 % (ref 11.5–15.5)
WBC: 8 10*3/uL (ref 4.0–10.5)

## 2015-10-06 LAB — COMPREHENSIVE METABOLIC PANEL
ALK PHOS: 50 U/L (ref 39–117)
ALT: 21 U/L (ref 0–53)
AST: 20 U/L (ref 0–37)
Albumin: 4.4 g/dL (ref 3.5–5.2)
BUN: 19 mg/dL (ref 6–23)
CHLORIDE: 100 meq/L (ref 96–112)
CO2: 33 mEq/L — ABNORMAL HIGH (ref 19–32)
Calcium: 10.2 mg/dL (ref 8.4–10.5)
Creatinine, Ser: 1.12 mg/dL (ref 0.40–1.50)
GFR: 67.11 mL/min (ref >60.00)
GLUCOSE: 105 mg/dL — AB (ref 70–99)
POTASSIUM: 4 meq/L (ref 3.5–5.1)
SODIUM: 139 meq/L (ref 135–145)
TOTAL PROTEIN: 7.1 g/dL (ref 6.0–8.3)
Total Bilirubin: 0.6 mg/dL (ref 0.2–1.2)

## 2015-10-06 LAB — FOLATE: FOLATE: 21.2 ng/mL (ref >5.9)

## 2015-10-06 LAB — TSH: TSH: 1.61 u[IU]/mL (ref 0.35–4.50)

## 2015-10-06 LAB — HEMOGLOBIN A1C: Hgb A1c MFr Bld: 6.5 % (ref 4.6–6.5)

## 2015-10-06 LAB — VITAMIN B12: Vitamin B-12: 754 pg/mL (ref 211–911)

## 2015-10-09 LAB — PROTEIN ELECTROPHORESIS, SERUM, WITH REFLEX
ALBUMIN ELP: 4.2 g/dL (ref 3.8–4.8)
ALPHA-1-GLOBULIN: 0.3 g/dL (ref 0.2–0.3)
Alpha-2-Globulin: 0.8 g/dL (ref 0.5–0.9)
BETA GLOBULIN: 0.4 g/dL (ref 0.4–0.6)
Beta 2: 0.3 g/dL (ref 0.2–0.5)
Gamma Globulin: 1 g/dL (ref 0.8–1.7)
TOTAL PROTEIN, SERUM ELECTROPHOR: 7 g/dL (ref 6.1–8.1)

## 2015-10-10 ENCOUNTER — Encounter: Payer: Self-pay | Admitting: *Deleted

## 2015-10-18 DIAGNOSIS — D485 Neoplasm of uncertain behavior of skin: Secondary | ICD-10-CM | POA: Diagnosis not present

## 2015-10-18 DIAGNOSIS — D225 Melanocytic nevi of trunk: Secondary | ICD-10-CM | POA: Diagnosis not present

## 2015-11-08 ENCOUNTER — Other Ambulatory Visit: Payer: Self-pay | Admitting: Family Medicine

## 2015-12-13 ENCOUNTER — Other Ambulatory Visit: Payer: Self-pay | Admitting: Family Medicine

## 2015-12-20 DIAGNOSIS — C439 Malignant melanoma of skin, unspecified: Secondary | ICD-10-CM

## 2015-12-20 HISTORY — DX: Malignant melanoma of skin, unspecified: C43.9

## 2015-12-21 ENCOUNTER — Ambulatory Visit (INDEPENDENT_AMBULATORY_CARE_PROVIDER_SITE_OTHER): Payer: Medicare Other

## 2015-12-21 DIAGNOSIS — Z23 Encounter for immunization: Secondary | ICD-10-CM | POA: Diagnosis not present

## 2015-12-21 NOTE — Progress Notes (Signed)
Pt requested a Flu vaccine. Vaccine administered.

## 2015-12-22 ENCOUNTER — Other Ambulatory Visit: Payer: Self-pay | Admitting: Family Medicine

## 2015-12-25 ENCOUNTER — Other Ambulatory Visit: Payer: Self-pay | Admitting: Family Medicine

## 2015-12-25 DIAGNOSIS — E785 Hyperlipidemia, unspecified: Secondary | ICD-10-CM

## 2015-12-25 DIAGNOSIS — E559 Vitamin D deficiency, unspecified: Secondary | ICD-10-CM

## 2015-12-26 ENCOUNTER — Ambulatory Visit (INDEPENDENT_AMBULATORY_CARE_PROVIDER_SITE_OTHER): Payer: Medicare Other

## 2015-12-26 VITALS — BP 130/78 | HR 49 | Temp 97.6°F | Ht 66.0 in | Wt 192.2 lb

## 2015-12-26 DIAGNOSIS — E559 Vitamin D deficiency, unspecified: Secondary | ICD-10-CM

## 2015-12-26 DIAGNOSIS — Z0000 Encounter for general adult medical examination without abnormal findings: Secondary | ICD-10-CM | POA: Diagnosis not present

## 2015-12-26 DIAGNOSIS — Z125 Encounter for screening for malignant neoplasm of prostate: Secondary | ICD-10-CM | POA: Diagnosis not present

## 2015-12-26 DIAGNOSIS — E785 Hyperlipidemia, unspecified: Secondary | ICD-10-CM | POA: Diagnosis not present

## 2015-12-26 LAB — BASIC METABOLIC PANEL
BUN: 15 mg/dL (ref 6–23)
CHLORIDE: 101 meq/L (ref 96–112)
CO2: 32 mEq/L (ref 19–32)
CREATININE: 0.96 mg/dL (ref 0.40–1.50)
Calcium: 9.9 mg/dL (ref 8.4–10.5)
GFR: 80.13 mL/min (ref >60.00)
GLUCOSE: 107 mg/dL — AB (ref 70–99)
Potassium: 4.4 mEq/L (ref 3.5–5.1)
Sodium: 140 mEq/L (ref 135–145)

## 2015-12-26 LAB — VITAMIN D 25 HYDROXY (VIT D DEFICIENCY, FRACTURES): VITD: 30.01 ng/mL (ref 30.00–100.00)

## 2015-12-26 LAB — LIPID PANEL
CHOL/HDL RATIO: 4
CHOLESTEROL: 140 mg/dL (ref 0–200)
HDL: 37.9 mg/dL — ABNORMAL LOW (ref >39.00)
NonHDL: 101.61
TRIGLYCERIDES: 242 mg/dL — AB (ref 0.0–149.0)
VLDL: 48.4 mg/dL — AB (ref 0.0–40.0)

## 2015-12-26 LAB — PSA, MEDICARE: PSA: 0.07 ng/mL — AB (ref 0.10–4.00)

## 2015-12-26 LAB — LDL CHOLESTEROL, DIRECT: Direct LDL: 74 mg/dL

## 2015-12-26 NOTE — Progress Notes (Signed)
Pre visit review using our clinic review tool, if applicable. No additional management support is needed unless otherwise documented below in the visit note. 

## 2015-12-26 NOTE — Progress Notes (Signed)
Subjective:   Aaron Stafford is a 81 y.o. male who presents for Medicare Annual (Subsequent) preventive examination.  Review of Systems:  N/A Cardiac Risk Factors include: advanced age (>45mn, >>79women);male gender;diabetes mellitus;dyslipidemia;hypertension     Objective:     Vitals: BP 130/78 (BP Location: Right Arm, Patient Position: Sitting, Cuff Size: Normal)   Pulse (!) 49   Temp 97.6 F (36.4 C) (Oral)   Ht '5\' 6"'$  (1.676 m) Comment: no shoes  Wt 192 lb 4 oz (87.2 kg)   SpO2 98%   BMI 31.03 kg/m   Body mass index is 31.03 kg/m.   Tobacco History  Smoking Status  . Never Smoker  Smokeless Tobacco  . Never Used     Counseling given: No   Past Medical History:  Diagnosis Date  . CAD (coronary artery disease)    with stents  . [facility] type [facility]   . Diverticulosis 06/2011   by CT and colonoscopy  . Fatty liver 06/2011   by CT  . GERD (gastroesophageal reflux disease)   . History of BPH   . History of shingles   . HLD (hyperlipidemia)   . HTN (hypertension)   . Ischemic heart disease   . MI (myocardial infarction) 1995  . Shingles rash 09/26/2012   Past Surgical History:  Procedure Laterality Date  . ANKLE SURGERY  03/2002   Left ankle fracture  . CAROTID STENT  07/2009   RCA stent  . CHOLECYSTECTOMY  1985  . COLONOSCOPY  03/2005   diverticulosis, tortuous colon with looping ([name]  . CORONARY ARTERY BYPASS GRAFT  05/1993   x6 MI Attemped angioplasty  . CYSTOSCOPY  10/03/08   Normal-Dr. SBernardo Heater . INGUINAL HERNIA REPAIR  11/2000   and ventral hernia repair   Family History  Problem Relation Age of Onset  . Kidney failure Mother   . Coronary artery disease Mother   . Heart failure Mother   . Hypertension Brother   . Heart attack Brother   . Alcohol abuse Brother   . Hypertension Brother   . Hypertension Sister    History  Sexual Activity  . Sexual activity: Not on file    Outpatient  Encounter Prescriptions as of 12/26/2015  Medication Sig  . amLODipine (NORVASC) 5 MG tablet TAKE ONE TABLET DAILY  . aspirin 81 MG tablet Take 81 mg by mouth daily.    . Blood Glucose Monitoring Suppl ([place] w/Device KIT 1 Device by Does not apply route daily. Test blood sugar once daily and as directed. Dx EX10.62 . Cholecalciferol (VITAMIN D3) 1000 UNITS CAPS Take 1 capsule (1,000 Units total) by mouth daily.  . Cranberry 500 MG CAPS Take 4,200 mg by mouth daily.   . finasteride (PROSCAR) 5 MG tablet TAKE 1 TABLET BY MOUTH DAILY  . fluticasone (FLONASE) 50 MCG/ACT nasal spray Place 2 sprays into both nostrils daily.  . furosemide (LASIX) 20 MG tablet TAKE 1 TABLET (20 MG TOTAL) BY MOUTH DAILY.  .Marland Kitchengabapentin (NEURONTIN) 300 MG capsule Take 1 capsule (300 mg total) by mouth 2 (two) times daily.  .Marland Kitchenglucose blood (ONETOUCH VERIO) test strip Check blood sugar once daily and as instructed. Dx EI94.85 . hydrochlorothiazide (HYDRODIURIL) 25 MG tablet TAKE 1 TABLET (25 MG TOTAL) BY MOUTH DAILY.  .Marland Kitchenlosartan (COZAAR) 25 MG tablet TAKE 1 TABLET (25 MG TOTAL) BY MOUTH DAILY.  . metFORMIN (GLUCOPHAGE)  500 MG tablet TAKE 1 TABLET (500 MG TOTAL) BY MOUTH 2 (TWO) TIMES DAILY WITH A MEAL.  . metoprolol tartrate (LOPRESSOR) 25 MG tablet TAKE 1/2 TABLET BY MOUTH 2 TIMES DAY  . Multiple Vitamin (MULTIVITAMIN) tablet Take 1 tablet by mouth daily.  . ONE TOUCH LANCETS MISC Check blood sugar once daily and as instructed. Dx E11.42  . pravastatin (PRAVACHOL) 40 MG tablet TAKE 1 TABLET (40 MG TOTAL) BY MOUTH AT BEDTIME.  . traZODone (DESYREL) 50 MG tablet Take 1-1.5 tablets (50-75 mg total) by mouth at bedtime as needed for sleep.  . vitamin B-12 (CYANOCOBALAMIN) 250 MCG tablet Take 250 mcg by mouth daily.   No facility-administered encounter medications on file as of 12/26/2015.     Activities of Daily Living In your present state of health, do you have any difficulty performing the following  activities: 12/26/2015  Hearing? N  Vision? N  Difficulty concentrating or making decisions? N  Walking or climbing stairs? N  Dressing or bathing? N  Doing errands, shopping? N  Preparing Food and eating ? N  Using the Toilet? N  In the past six months, have you accidently leaked urine? N  Do you have problems with loss of bowel control? N  Managing your Medications? N  Managing your Finances? N  Housekeeping or managing your Housekeeping? N  Some recent data might be hidden    Patient Care Team: Ria Bush, MD as PCP - General (Family Medicine) Kem Parkinson, MD as Consulting Physician (Ophthalmology) Minna Merritts, MD as Consulting Physician (Cardiology)    Assessment:     Hearing Screening   '125Hz'$  '250Hz'$  '500Hz'$  '1000Hz'$  '2000Hz'$  '3000Hz'$  '4000Hz'$  '6000Hz'$  '8000Hz'$   Right ear:   40 40 40  0    Left ear:   40 40 40  40    Vision Screening Comments: Last vision exam approx. 6 mths ago with Dr. Paulla Fore   Exercise Activities and Dietary recommendations Current Exercise Habits: The patient does not participate in regular exercise at present, Exercise limited by: None identified  Goals    . Weight (lb) < 176 lb (79.8 kg) (pt-stated)          Starting 01/03/2016, I will begin walking for 30 min 3 days a week to reach target weight of 175 lbs.       Fall Risk Fall Risk  12/26/2015 12/14/2014 08/09/2013 03/25/2012  Falls in the past year? No No No No   Depression Screen PHQ 2/9 Scores 12/26/2015 12/14/2014 08/09/2013 03/25/2012  PHQ - 2 Score 0 0 0 0     Cognitive Function MMSE - Mini Mental State Exam 12/26/2015  Orientation to time 5  Orientation to Place 5  Registration 3  Attention/ Calculation 0  Recall 3  Language- name 2 objects 0  Language- repeat 1  Language- follow 3 step command 3  Language- read & follow direction 0  Write a sentence 0  Copy design 0  Total score 20     PLEASE NOTE: A Mini-Cog screen was completed. Maximum score is 20. A value of 0 denotes  this part of [place] MMSE was not completed or the patient failed this part of the Mini-Cog screening.   Mini-Cog Screening Orientation to Time - Max 5 pts Orientation to Place - Max 5 pts Registration - Max 3 pts Recall - Max 3 pts Language Repeat - Max 1 pts Language Follow 3 Step Command - Max 3 pts     Immunization History  Administered  Date(s) Administered  . Influenza Split 11/21/2011  . Influenza Whole 11/19/2003, 03/01/2009  . Influenza,inj,Quad PF,36+ Mos 11/23/2012, 02/15/2014, 12/14/2014, 12/21/2015  . Pneumococcal Conjugate-13 08/09/2013  . Pneumococcal Polysaccharide-23 05/10/2004  . Td 12/26/1997, 04/12/2009  . Zoster 11/15/2010   Screening Tests Health Maintenance  Topic Date Due  . DTaP/Tdap/Td (1 - Tdap) 04/13/2019 (Originally 04/13/2009)  . HEMOGLOBIN A1C  04/06/2016  . OPHTHALMOLOGY EXAM  07/10/2016  . FOOT EXAM  10/04/2016  . TETANUS/TDAP  04/13/2019  . INFLUENZA VACCINE  Addressed  . ZOSTAVAX  Completed  . PNA vac Low Risk Adult  Completed      Plan:     I have personally reviewed and addressed the Medicare Annual Wellness questionnaire and have noted the following in the patient's chart:  A. Medical and social history B. Use of alcohol, tobacco or illicit drugs  C. Current medications and supplements D. Functional ability and status E.  Nutritional status F.  Physical activity G. Advance directives H. List of other physicians I.  Hospitalizations, surgeries, and ER visits in previous 12 months J.  [place] to include hearing, vision, cognitive, depression L. Referrals and appointments - none  In addition, I have reviewed and discussed with patient certain preventive protocols, quality metrics, and best practice recommendations. A written personalized care plan for preventive services as well as general preventive health recommendations were provided to patient.  See attached scanned questionnaire for additional information.    Signed,   Lindell Noe, MHA, BS, LPN Health Coach

## 2015-12-26 NOTE — Progress Notes (Signed)
PCP notes:   Health maintenance:  No gaps identified. Health maintenance schedule reviewed.   Abnormal screenings:   Hearing - failed  Patient concerns:   Pt is still having concerns with neuropathy. Additionally, pt reported he has a malignant melanoma removed from his scalp.   Nurse concerns:  None  Next PCP appt:   12/28/15 @ 1030

## 2015-12-26 NOTE — Patient Instructions (Signed)
Mr. Majkut , Thank you for taking time to come for your Medicare Wellness Visit. I appreciate your ongoing commitment to your health goals. Please review the following plan we discussed and let me know if I can assist you in the future.   These are the goals we discussed: Goals    . Weight (lb) < 176 lb (79.8 kg) (pt-stated)          Starting 01/03/2016, I will begin walking for 30 min 3 days a week to reach target weight of 175 lbs.        This is a list of the screening recommended for you and due dates:  Health Maintenance  Topic Date Due  . DTaP/Tdap/Td vaccine (1 - Tdap) 04/13/2019*  . Hemoglobin A1C  04/06/2016  . Eye exam for diabetics  07/10/2016  . Complete foot exam   10/04/2016  . Tetanus Vaccine  04/13/2019  . Flu Shot  Addressed  . Shingles Vaccine  Completed  . Pneumonia vaccines  Completed  *Topic was postponed. The date shown is not the original due date.   Preventive Care for Adults  A healthy lifestyle and preventive care can promote health and wellness. Preventive health guidelines for adults include the following key practices.  . A routine yearly physical is a good way to check with your health care provider about your health and preventive screening. It is a chance to share any concerns and updates on your health and to receive a thorough exam.  . Visit your dentist for a routine exam and preventive care every 6 months. Brush your teeth twice a day and floss once a day. Good oral hygiene prevents tooth decay and gum disease.  . The frequency of eye exams is based on your age, health, family medical history, use  of contact lenses, and other factors. Follow your health care provider's ecommendations for frequency of eye exams.  . Eat a healthy diet. Foods like vegetables, fruits, whole grains, low-fat dairy products, and lean protein foods contain the nutrients you need without too many calories. Decrease your intake of foods high in solid fats, added sugars,  and salt. Eat the right amount of calories for you. Get information about a proper diet from your health care provider, if necessary.  . Regular physical exercise is one of the most important things you can do for your health. Most adults should get at least 150 minutes of moderate-intensity exercise (any activity that increases your heart rate and causes you to sweat) each week. In addition, most adults need muscle-strengthening exercises on 2 or more days a week.  Silver Sneakers may be a benefit available to you. To determine eligibility, you may visit the website: www.silversneakers.com or contact program at 205-684-8669 Mon-Fri between 8AM-8PM.   . Maintain a healthy weight. The body mass index (BMI) is a screening tool to identify possible weight problems. It provides an estimate of body fat based on height and weight. Your health care provider can find your BMI and can help you achieve or maintain a healthy weight.   For adults 20 years and older: ? A BMI below 18.5 is considered underweight. ? A BMI of 18.5 to 24.9 is normal. ? A BMI of 25 to 29.9 is considered overweight. ? A BMI of 30 and above is considered obese.   . Maintain normal blood lipids and cholesterol levels by exercising and minimizing your intake of saturated fat. Eat a balanced diet with plenty of fruit and vegetables. Blood  tests for lipids and cholesterol should begin at age 76 and be repeated every 5 years. If your lipid or cholesterol levels are high, you are over 50, or you are at high risk for heart disease, you may need your cholesterol levels checked more frequently. Ongoing high lipid and cholesterol levels should be treated with medicines if diet and exercise are not working.  . If you smoke, find out from your health care provider how to quit. If you do not use tobacco, please do not start.  . If you choose to drink alcohol, please do not consume more than 2 drinks per day. One drink is considered to be 12  ounces (355 mL) of beer, 5 ounces (148 mL) of wine, or 1.5 ounces (44 mL) of liquor.  . If you are 23-70 years old, ask your health care provider if you should take aspirin to prevent strokes.  . Use sunscreen. Apply sunscreen liberally and repeatedly throughout the day. You should seek shade when your shadow is shorter than you. Protect yourself by wearing long sleeves, pants, a wide-brimmed hat, and sunglasses year round, whenever you are outdoors.  . Once a month, do a whole body skin exam, using a mirror to look at the skin on your back. Tell your health care provider of new moles, moles that have irregular borders, moles that are larger than a pencil eraser, or moles that have changed in shape or color.

## 2015-12-28 ENCOUNTER — Ambulatory Visit: Payer: Medicare Other

## 2015-12-28 ENCOUNTER — Encounter: Payer: Self-pay | Admitting: Family Medicine

## 2015-12-28 ENCOUNTER — Ambulatory Visit (INDEPENDENT_AMBULATORY_CARE_PROVIDER_SITE_OTHER): Payer: Medicare Other | Admitting: Family Medicine

## 2015-12-28 VITALS — BP 140/70 | HR 64 | Temp 98.1°F | Wt 192.0 lb

## 2015-12-28 DIAGNOSIS — E559 Vitamin D deficiency, unspecified: Secondary | ICD-10-CM | POA: Diagnosis not present

## 2015-12-28 DIAGNOSIS — Z7189 Other specified counseling: Secondary | ICD-10-CM

## 2015-12-28 DIAGNOSIS — I10 Essential (primary) hypertension: Secondary | ICD-10-CM

## 2015-12-28 DIAGNOSIS — G6289 Other specified polyneuropathies: Secondary | ICD-10-CM

## 2015-12-28 DIAGNOSIS — E1142 Type 2 diabetes mellitus with diabetic polyneuropathy: Secondary | ICD-10-CM

## 2015-12-28 DIAGNOSIS — E785 Hyperlipidemia, unspecified: Secondary | ICD-10-CM

## 2015-12-28 DIAGNOSIS — G4700 Insomnia, unspecified: Secondary | ICD-10-CM

## 2015-12-28 DIAGNOSIS — E669 Obesity, unspecified: Secondary | ICD-10-CM

## 2015-12-28 DIAGNOSIS — R0989 Other specified symptoms and signs involving the circulatory and respiratory systems: Secondary | ICD-10-CM

## 2015-12-28 MED ORDER — NORTRIPTYLINE HCL 25 MG PO CAPS: 25.0000 mg | ORAL_CAPSULE | Freq: Every day | ORAL | 3 refills | Status: DC

## 2015-12-28 NOTE — Assessment & Plan Note (Signed)
Improving on replacement.

## 2015-12-28 NOTE — Progress Notes (Signed)
Pre visit review using our clinic review tool, if applicable. No additional management support is needed unless otherwise documented below in the visit note. 

## 2015-12-28 NOTE — Assessment & Plan Note (Signed)
Continue to encourage active lifestyle for goal weight loss.

## 2015-12-28 NOTE — Assessment & Plan Note (Signed)
Chronic, stable. Continue current regimen. 

## 2015-12-28 NOTE — Assessment & Plan Note (Signed)
Hold trazodone while trialing pamelor - see below.

## 2015-12-28 NOTE — Assessment & Plan Note (Signed)
Chronic. Continue pravastatin. Encouraged increased fatty fish in diet to lower triglyceride levels.

## 2015-12-28 NOTE — Assessment & Plan Note (Signed)
Advanced planning - scanned into chart 06/2014. HCPOA is wife Judy then granddaughter Blair RN. Ok with tube feeds. Does not want prolonged life support.  

## 2015-12-28 NOTE — Assessment & Plan Note (Signed)
Anticipate diabetic. Lab work unrevealing. Trial pamelor + gabapentin, hold trazodone for now. Pt will let me know if he desires to pursue NCS for further evaluation.  With diminished pulses, check ABI.

## 2015-12-28 NOTE — Assessment & Plan Note (Signed)
Chronic, stable. Continue metformin.  

## 2015-12-28 NOTE — Patient Instructions (Addendum)
We will sign you up for cologuard.  Trial nortriptyline in place of trazodone for sleep and for neuropathy.  Let me know if you'd like to undergo nerve conduction study.  Let's check arterial circulation of the legs.

## 2015-12-28 NOTE — Progress Notes (Signed)
BP 140/70   Pulse 64   Temp 98.1 F (36.7 C) (Oral)   Wt 192 lb (87.1 kg)   BMI 30.99 kg/m    CC: f/u visit Subjective:    Patient ID: Aaron Stafford, male    DOB: Feb 11, 1935, 81 y.o.   MRN: [id number]  HPI: Aaron Stafford is a 81 y.o. male presenting on 12/28/2015 for Annual Exam   Saw Katha Cabal last week for medicare wellness visit, note reviewed.   Ongoing neuropathy - see recent office note for details. Labs unrevealing - thought diabetic retinopathy.  Recent melanoma removed from scalp - margins clear.   Preventative: Prostate - h/o BPH, takes finasteride. No h/o prostate cancer. Stopped DRE, intermittent PSA.  COLONOSCOPY Date: 03/2005 diverticulosis, tortuous colon with looping [name]). Would like cologuard.  Flu shot yearly Pneumovax 2006. prevnar - 07/2013 Last tetanus 2011 zostavax 2012 Advanced planning - scanned into chart 06/2014. HCPOA is wife Aaron Stafford then granddaughter [name]. Ok with tube feeds. Does not want prolonged life support Seat belt use discussed.  Sunscreen use discussed. 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 Interim medical history since our last visit reviewed. Allergies and medications reviewed and updated. Current Outpatient Prescriptions on File Prior to Visit  Medication Sig  . amLODipine (NORVASC) 5 MG tablet TAKE ONE TABLET DAILY  . aspirin 81 MG tablet Take 81 mg by mouth daily.    . Blood Glucose Monitoring Suppl ([place]) w/Device KIT 1 Device by Does not apply route daily. Test blood sugar once daily and as directed. Dx T02.11  . Cholecalciferol (VITAMIN D3) 1000 UNITS CAPS Take 1 capsule (1,000 Units total) by mouth  daily.  . Cranberry 500 MG CAPS Take 4,200 mg by mouth daily.   . finasteride (PROSCAR) 5 MG tablet TAKE 1 TABLET BY MOUTH DAILY  . fluticasone (FLONASE) 50 MCG/ACT nasal spray Place 2 sprays into both nostrils daily.  . furosemide (LASIX) 20 MG tablet TAKE 1 TABLET (20 MG TOTAL) BY MOUTH DAILY.  Marland Kitchen gabapentin (NEURONTIN) 300 MG capsule Take 1 capsule (300 mg total) by mouth 2 (two) times daily.  Marland Kitchen glucose blood (ONETOUCH VERIO) test strip Check blood sugar once daily and as instructed. Dx Z73.56  . hydrochlorothiazide (HYDRODIURIL) 25 MG tablet TAKE 1 TABLET (25 MG TOTAL) BY MOUTH DAILY.  Marland Kitchen losartan (COZAAR) 25 MG tablet TAKE 1 TABLET (25 MG TOTAL) BY MOUTH DAILY.  . metFORMIN (GLUCOPHAGE) 500 MG tablet TAKE 1 TABLET (500 MG TOTAL) BY MOUTH 2 (TWO) TIMES DAILY WITH A MEAL.  . metoprolol tartrate (LOPRESSOR) 25 MG tablet TAKE 1/2 TABLET BY MOUTH 2 TIMES DAY  . Multiple Vitamin (MULTIVITAMIN) tablet Take 1 tablet by mouth daily.  . ONE TOUCH LANCETS MISC Check blood sugar once daily and as instructed. Dx E11.42  . pravastatin (PRAVACHOL) 40 MG tablet TAKE 1 TABLET (40 MG TOTAL) BY MOUTH AT BEDTIME.  . traZODone (DESYREL) 50 MG tablet Take 1-1.5 tablets (50-75 mg total) by mouth at bedtime as needed for sleep.  . vitamin B-12 (CYANOCOBALAMIN) 250 MCG tablet Take 250 mcg by mouth daily.   No current facility-administered medications on file prior to visit.     Review  of Systems Per HPI unless specifically indicated in ROS section     Objective:    BP 140/70   Pulse 64   Temp 98.1 F (36.7 C) (Oral)   Wt 192 lb (87.1 kg)   BMI 30.99 kg/m   Wt Readings from Last 3 Encounters:  12/28/15 192 lb (87.1 kg)  12/26/15 192 lb 4 oz (87.2 kg)  10/05/15 196 lb 8 oz (89.1 kg)    Physical Exam  Constitutional: He appears well-developed and well-nourished. No distress.  HENT:  Head: Normocephalic and atraumatic.  Right Ear: Hearing, tympanic membrane, external ear and ear canal normal.  Left  Ear: Hearing, tympanic membrane, external ear and ear canal normal.  Nose: Nose normal.  Mouth/Throat: Oropharynx is clear and moist. No oropharyngeal exudate.  Eyes: Conjunctivae and EOM are normal. Pupils are equal, round, and reactive to light. No scleral icterus.  Neck: Normal range of motion. Neck supple. Carotid bruit is not present. No thyromegaly present.  Cardiovascular: Normal rate, regular rhythm, normal heart sounds and intact distal pulses.   No murmur heard. Pulmonary/Chest: Effort normal and breath sounds normal. No respiratory distress. He has no wheezes. He has no rales.  Musculoskeletal: He exhibits no edema.  See HPI for foot exam if done  Lymphadenopathy:    He has no cervical adenopathy.  Skin: Skin is warm and dry. No rash noted.  Psychiatric: He has a normal mood and affect. His behavior is normal. Judgment and thought content normal.  Nursing note and vitals reviewed.  Results for orders placed or performed in visit on 12/26/15  PSA, Medicare  Result Value Ref Range   PSA 0.07 (L) 0.10 - 4.00 ng/ml  Lipid panel  Result Value Ref Range   Cholesterol 140 0 - 200 mg/dL   Triglycerides 242.0 (H) 0.0 - 149.0 mg/dL   HDL 37.90 (L) >39.00 mg/dL   VLDL 48.4 (H) 0.0 - 40.0 mg/dL   Total CHOL/HDL Ratio 4    NonHDL 423.53   Basic metabolic panel  Result Value Ref Range   Sodium 140 135 - 145 mEq/L   Potassium 4.4 3.5 - 5.1 mEq/L   Chloride 101 96 - 112 mEq/L   CO2 32 19 - 32 mEq/L   Glucose, Bld 107 (H) 70 - 99 mg/dL   BUN 15 6 - 23 mg/dL   Creatinine, Ser 0.96 0.40 - 1.50 mg/dL   Calcium 9.9 8.4 - 10.5 mg/dL   GFR 80.13 >60.00 mL/min  VITAMIN D 25 Hydroxy (Vit-D Deficiency, Fractures)  Result Value Ref Range   VITD 30.01 30.00 - 100.00 ng/mL  LDL cholesterol, direct  Result Value Ref Range   Direct LDL 74.0 mg/dL      Assessment & Plan:   Problem List Items Addressed This Visit    Advanced care planning/counseling discussion    Advanced planning -  scanned into chart 06/2014. HCPOA is wife Aaron Stafford then granddaughter [name]. Ok with tube feeds. Does not want prolonged life support       HLD (hyperlipidemia)    Chronic. Continue pravastatin. Encouraged increased fatty fish in diet to lower triglyceride levels.       HTN (hypertension)    Chronic, stable. Continue current regimen.      Insomnia    Hold trazodone while trialing pamelor - see below.       Obesity, Class I, BMI 30-34.9    Continue to encourage active lifestyle for goal weight loss.      Peripheral neuropathy (  [place]) - Primary    Anticipate diabetic. Lab work unrevealing. Trial pamelor + gabapentin, hold trazodone for now. Pt will let me know if he desires to pursue NCS for further evaluation.  With diminished pulses, check ABI.      Relevant Medications   nortriptyline (PAMELOR) 25 MG capsule   Type 2 diabetes, controlled, with peripheral neuropathy (HCC)    Chronic, stable. Continue metformin.      Relevant Medications   nortriptyline (PAMELOR) 25 MG capsule   Other Relevant Orders   VAS [place] LE ART SEG MULTI (Segm&LE [place]aud[place])   Vitamin D deficiency    Improving on replacement.        Other Visit Diagnoses    Diminished pulses in lower extremity       Relevant Orders   VAS [place] LE ART SEG MULTI (Segm&LE [place]aud[place])       Follow up plan: Return in about 6 months (around 06/26/2016), or if symptoms worsen or fail to improve, for follow up visit.  Ria Bush, MD

## 2015-12-29 NOTE — Progress Notes (Signed)
I reviewed health advisor's note, was available for consultation, and agree with documentation and plan.  

## 2016-01-01 ENCOUNTER — Other Ambulatory Visit: Payer: Self-pay | Admitting: Family Medicine

## 2016-01-15 ENCOUNTER — Other Ambulatory Visit: Payer: Self-pay | Admitting: Family Medicine

## 2016-01-15 DIAGNOSIS — R0989 Other specified symptoms and signs involving the circulatory and respiratory systems: Secondary | ICD-10-CM

## 2016-01-16 DIAGNOSIS — Z1211 Encounter for screening for malignant neoplasm of colon: Secondary | ICD-10-CM | POA: Diagnosis not present

## 2016-01-16 DIAGNOSIS — Z1212 Encounter for screening for malignant neoplasm of rectum: Secondary | ICD-10-CM | POA: Diagnosis not present

## 2016-01-18 LAB — COLOGUARD: COLOGUARD: NEGATIVE

## 2016-01-26 ENCOUNTER — Ambulatory Visit: Payer: Medicare Other

## 2016-01-26 DIAGNOSIS — R0989 Other specified symptoms and signs involving the circulatory and respiratory systems: Secondary | ICD-10-CM | POA: Diagnosis not present

## 2016-01-26 DIAGNOSIS — E1142 Type 2 diabetes mellitus with diabetic polyneuropathy: Secondary | ICD-10-CM

## 2016-01-30 ENCOUNTER — Other Ambulatory Visit: Payer: Self-pay | Admitting: Family Medicine

## 2016-01-30 NOTE — Telephone Encounter (Signed)
k to refill in Dr. Synthia Innocent absence? Last filled 10/05/15 #90 1RF.

## 2016-02-14 ENCOUNTER — Encounter: Payer: Self-pay | Admitting: *Deleted

## 2016-02-26 ENCOUNTER — Other Ambulatory Visit: Payer: Self-pay | Admitting: Family Medicine

## 2016-03-16 ENCOUNTER — Other Ambulatory Visit: Payer: Self-pay | Admitting: Family Medicine

## 2016-04-02 DIAGNOSIS — Z872 Personal history of diseases of the skin and subcutaneous tissue: Secondary | ICD-10-CM | POA: Diagnosis not present

## 2016-04-02 DIAGNOSIS — L570 Actinic keratosis: Secondary | ICD-10-CM | POA: Diagnosis not present

## 2016-04-02 DIAGNOSIS — Z85820 Personal history of malignant melanoma of skin: Secondary | ICD-10-CM | POA: Diagnosis not present

## 2016-04-02 DIAGNOSIS — D485 Neoplasm of uncertain behavior of skin: Secondary | ICD-10-CM | POA: Diagnosis not present

## 2016-04-02 DIAGNOSIS — Z859 Personal history of malignant neoplasm, unspecified: Secondary | ICD-10-CM | POA: Diagnosis not present

## 2016-04-02 DIAGNOSIS — L821 Other seborrheic keratosis: Secondary | ICD-10-CM | POA: Diagnosis not present

## 2016-04-02 DIAGNOSIS — L218 Other seborrheic dermatitis: Secondary | ICD-10-CM | POA: Diagnosis not present

## 2016-04-02 DIAGNOSIS — Z86018 Personal history of other benign neoplasm: Secondary | ICD-10-CM | POA: Diagnosis not present

## 2016-04-23 ENCOUNTER — Other Ambulatory Visit: Payer: Self-pay | Admitting: Family Medicine

## 2016-04-30 ENCOUNTER — Other Ambulatory Visit: Payer: Self-pay | Admitting: Family Medicine

## 2016-05-26 ENCOUNTER — Other Ambulatory Visit: Payer: Self-pay | Admitting: Family Medicine

## 2016-05-27 ENCOUNTER — Other Ambulatory Visit: Payer: Self-pay | Admitting: Family Medicine

## 2016-05-27 NOTE — Telephone Encounter (Signed)
Received refill request electronically Last refill 01/30/16 #90/1 Last office visit 12/28/15

## 2016-05-27 NOTE — Telephone Encounter (Signed)
Received refill electronically Last office visit 12/28/15 Gabapentin last refill 06/10/15 #60/11 Nortriptyline last refill 12/28/15 #30/3

## 2016-06-12 ENCOUNTER — Other Ambulatory Visit: Payer: Self-pay | Admitting: Family Medicine

## 2016-06-26 ENCOUNTER — Ambulatory Visit (INDEPENDENT_AMBULATORY_CARE_PROVIDER_SITE_OTHER): Payer: Medicare Other | Admitting: Family Medicine

## 2016-06-26 ENCOUNTER — Ambulatory Visit: Payer: Medicare Other | Admitting: Family Medicine

## 2016-06-26 ENCOUNTER — Encounter (INDEPENDENT_AMBULATORY_CARE_PROVIDER_SITE_OTHER): Payer: Self-pay

## 2016-06-26 ENCOUNTER — Encounter: Payer: Self-pay | Admitting: Family Medicine

## 2016-06-26 VITALS — BP 140/82 | Wt 198.0 lb

## 2016-06-26 DIAGNOSIS — I10 Essential (primary) hypertension: Secondary | ICD-10-CM

## 2016-06-26 DIAGNOSIS — E1142 Type 2 diabetes mellitus with diabetic polyneuropathy: Secondary | ICD-10-CM | POA: Diagnosis not present

## 2016-06-26 DIAGNOSIS — G4700 Insomnia, unspecified: Secondary | ICD-10-CM | POA: Diagnosis not present

## 2016-06-26 DIAGNOSIS — G6289 Other specified polyneuropathies: Secondary | ICD-10-CM | POA: Diagnosis not present

## 2016-06-26 LAB — HEMOGLOBIN A1C: Hgb A1c MFr Bld: 6.9 % — ABNORMAL HIGH (ref 4.6–6.5)

## 2016-06-26 MED ORDER — AMLODIPINE BESYLATE 5 MG PO TABS: 5.0000 mg | ORAL_TABLET | Freq: Every day | ORAL | 3 refills | Status: DC

## 2016-06-26 MED ORDER — GABAPENTIN 300 MG PO CAPS: 600.0000 mg | ORAL_CAPSULE | Freq: Two times a day (BID) | ORAL | 1 refills | Status: DC

## 2016-06-26 MED ORDER — GABAPENTIN 300 MG PO CAPS: 600.0000 mg | ORAL_CAPSULE | Freq: Two times a day (BID) | ORAL | 6 refills | Status: DC

## 2016-06-26 MED ORDER — ALPHA-LIPOIC ACID 600 MG PO CAPS: 600.0000 mg | ORAL_CAPSULE | Freq: Two times a day (BID) | ORAL | Status: DC

## 2016-06-26 NOTE — Progress Notes (Signed)
Pre visit review using our clinic review tool, if applicable. No additional management support is needed unless otherwise documented below in the visit note. 

## 2016-06-26 NOTE — Progress Notes (Signed)
BP 140/82   Wt 198 lb (89.8 kg)   BMI 31.96 kg/m    CC: f/u visit Subjective:    Patient ID: Aaron Stafford, male    DOB: 28-Oct-1934, 82 y.o.   MRN: [id number]  HPI: Aaron Stafford is a 82 y.o. male presenting on 06/26/2016 for Follow-up; Medication Management; changes in skin; and Peripheral Neuropathy (feet/trouble sleeping at night)   Discussed friend Wendelyn Breslow passing.   Ongoing and worsening foot pain attributed to peripheral neuropathy "burning pain" - see last few office notes for details. Describes typical stocking and glove distribution. Labs unrevealing including SPEP - thought diabetic neuropathy. Last visit started pamelor + gabapentin. He never tried Teacher, music. We discussed NCS for further evaluation.   He did go to [facility] clinic and had neuropathy evaluation.   Trazodone helps sleep as long as feet don't hurt.   ABIs were WNL.   Diabetic Foot Exam - Simple   Simple Foot Form Diabetic Foot exam was performed with the following findings:  Yes 06/26/2016 10:50 AM  Visual Inspection No deformities, no ulcerations, no other skin breakdown bilaterally:  Yes Sensation Testing Intact to touch and monofilament testing bilaterally:  Yes Pulse Check See comments:  Yes Comments Diminished pulses bilaterally Sensation intact to light touch and monofilament testing Some scaling of soles Some maceration between digits L>R      Relevant past medical, surgical, family and social history reviewed and updated as indicated. Interim medical history since our last visit reviewed. Allergies and medications reviewed and updated. Outpatient Medications Prior to Visit  Medication Sig Dispense Refill  . aspirin 81 MG tablet Take 81 mg by mouth daily.      . Blood Glucose Monitoring Suppl ([place]) w/Device KIT 1 Device by Does not apply route daily. Test blood sugar once daily and as directed. Dx E11.42 1 kit 0  . Cholecalciferol (VITAMIN D3) 1000  UNITS CAPS Take 1 capsule (1,000 Units total) by mouth daily. 30 capsule   . Cranberry 500 MG CAPS Take 4,200 mg by mouth daily.     . finasteride (PROSCAR) 5 MG tablet TAKE 1 TABLET BY MOUTH DAILY 90 tablet 3  . fluticasone (FLONASE) 50 MCG/ACT nasal spray Place 2 sprays into both nostrils daily. 16 g 0  . furosemide (LASIX) 20 MG tablet TAKE 1 TABLET (20 MG TOTAL) BY MOUTH DAILY. 90 tablet 3  . hydrochlorothiazide (HYDRODIURIL) 25 MG tablet TAKE 1 TABLET (25 MG TOTAL) BY MOUTH DAILY. 90 tablet 3  . losartan (COZAAR) 25 MG tablet TAKE 1 TABLET (25 MG TOTAL) BY MOUTH DAILY. 90 tablet 3  . metFORMIN (GLUCOPHAGE) 500 MG tablet TAKE 1 TABLET (500 MG TOTAL) BY MOUTH 2 (TWO) TIMES DAILY WITH A MEAL. 180 tablet 3  . metoprolol tartrate (LOPRESSOR) 25 MG tablet TAKE 1/2 TABLET BY MOUTH 2 TIMES DAY 90 tablet 3  . Multiple Vitamin (MULTIVITAMIN) tablet Take 1 tablet by mouth daily.    . nortriptyline (PAMELOR) 25 MG capsule TAKE 1 CAPSULE (25 MG TOTAL) BY MOUTH AT BEDTIME. 30 capsule 6  . ONE TOUCH ULTRA TEST test strip CHECK BLOOD SUGAR ONCE DAILY AND AS INSTRUCTED. DX E11.42 100 each 3  . ONETOUCH DELICA LANCETS 02I MISC CHECK BLOOD SUGAR ONCE DAILY AND AS INSTRUCTED. DX E11.42 200 each 0  . pravastatin (PRAVACHOL) 40 MG tablet TAKE 1 TABLET (40 MG TOTAL) BY MOUTH AT BEDTIME. 90 tablet 3  . traZODone (DESYREL) 50 MG tablet TAKE 1-1.5  TABLETS (50-75 MG TOTAL) BY MOUTH AT BEDTIME AS NEEDED FOR SLEEP. 90 tablet 1  . vitamin B-12 (CYANOCOBALAMIN) 250 MCG tablet Take 250 mcg by mouth once a week.     Marland Kitchen amLODipine (NORVASC) 5 MG tablet TAKE ONE TABLET DAILY 30 tablet 6  . gabapentin (NEURONTIN) 300 MG capsule TAKE 1 CAPSULE (300 MG TOTAL) BY MOUTH 2 (TWO) TIMES DAILY. 60 capsule 6   No facility-administered medications prior to visit.      Per HPI unless specifically indicated in ROS section below Review of Systems     Objective:    BP 140/82   Wt 198 lb (89.8 kg)   BMI 31.96 kg/m   Wt Readings  from Last 3 Encounters:  06/26/16 198 lb (89.8 kg)  12/28/15 192 lb (87.1 kg)  12/26/15 192 lb 4 oz (87.2 kg)    BP Readings from Last 3 Encounters:  06/26/16 140/82  12/28/15 140/70  12/26/15 130/78    Physical Exam  Constitutional: He appears well-developed and well-nourished. No distress.  HENT:  Head: Normocephalic and atraumatic.  Right Ear: External ear normal.  Left Ear: External ear normal.  Nose: Nose normal.  Mouth/Throat: Oropharynx is clear and moist. No oropharyngeal exudate.  Eyes: Conjunctivae and EOM are normal. Pupils are equal, round, and reactive to light. No scleral icterus.  Neck: Normal range of motion. Neck supple.  Cardiovascular: Normal rate, regular rhythm, normal heart sounds and intact distal pulses.   No murmur heard. Pulmonary/Chest: Effort normal and breath sounds normal. No respiratory distress. He has no wheezes. He has no rales.  Musculoskeletal: He exhibits no edema.  See HPI for foot exam if done  Lymphadenopathy:    He has no cervical adenopathy.  Skin: Skin is warm and dry. No rash noted.  Psychiatric: He has a normal mood and affect.  Nursing note and vitals reviewed.  Results for orders placed or performed in visit on 02/14/16  Cologuard  Result Value Ref Range   Cologuard Negative    Lab Results  Component Value Date   HGBA1C 6.5 10/05/2015       Assessment & Plan:   Problem List Items Addressed This Visit    HTN (hypertension)    Chronic, stable. Amlodipine refilled.       Relevant Medications   amLODipine (NORVASC) 5 MG tablet   Insomnia    Discussed trazodone vs pamelor       Peripheral neuropathy    Deteriorated. See above.  s/p benign lab workup.  Thought diabetic neuropathy.       Relevant Medications   gabapentin (NEURONTIN) 300 MG capsule   Type 2 diabetes, controlled, with peripheral neuropathy ([place]) - Primary    Chronic. Update A1c. On metformin. He did not try nortriptyline - confusion with trazodone  vs nortriptyline. Clarified to take one or the other. Given worsening neuropathy- will increase gabapentin to 656m BID, will start alpha lipoic acid 6036mbid.  Discussed NCS and neurology referral - pt opts to try above for next 2-4 wks and if no improvement will let us[place] for neurology referral.       Relevant Medications   gabapentin (NEURONTIN) 300 MG capsule   Other Relevant Orders   Hemoglobin A1c       Follow up plan: Return in about 6 months (around 12/27/2016) for annual exam, prior fasting for blood work, follow up visit.  JaRia BushMD

## 2016-06-26 NOTE — Assessment & Plan Note (Signed)
Chronic. Update A1c. On metformin. He did not try nortriptyline - confusion with trazodone vs nortriptyline. Clarified to take one or the other. Given worsening neuropathy- will increase gabapentin to 600mg  BID, will start alpha lipoic acid 600mg  bid.  Discussed NCS and neurology referral - pt opts to try above for next 2-4 wks and if no improvement will let us know for neurology referral.

## 2016-06-26 NOTE — Assessment & Plan Note (Signed)
Chronic, stable. Amlodipine refilled.

## 2016-06-26 NOTE — Patient Instructions (Addendum)
Labs today. New gabapentin dose will be 600mg  twice daily.  May take pamelor (nortriptyline) in place of trazodone for sleep and neuropathy pain.  Start alpha lipoic acid 600mg  twice daily (over the counter supplement).  Give above changes 2-4 weeks to assess effect.  If no better with above, let us know for neurologist referral and nerve conduction study.  Return in 6 months for medicare wellness visit with Aaron Stafford and f/u with me

## 2016-06-26 NOTE — Assessment & Plan Note (Signed)
Discussed trazodone vs pamelor

## 2016-06-26 NOTE — Assessment & Plan Note (Signed)
Deteriorated. See above.  s/p benign lab workup.  Thought diabetic neuropathy.

## 2016-06-27 ENCOUNTER — Telehealth: Payer: Self-pay | Admitting: Family Medicine

## 2016-06-27 NOTE — Telephone Encounter (Signed)
Returned patient's call and left v/m.

## 2016-06-27 NOTE — Telephone Encounter (Signed)
Pt returned missed call. Michela Pitcher you can contact him on [phone]

## 2016-08-08 ENCOUNTER — Encounter: Payer: Self-pay | Admitting: Family Medicine

## 2016-08-08 ENCOUNTER — Ambulatory Visit (INDEPENDENT_AMBULATORY_CARE_PROVIDER_SITE_OTHER): Payer: Medicare Other | Admitting: Family Medicine

## 2016-08-08 VITALS — BP 140/82 | HR 61 | Temp 98.3°F | Ht 66.0 in | Wt 192.0 lb

## 2016-08-08 DIAGNOSIS — J069 Acute upper respiratory infection, unspecified: Secondary | ICD-10-CM | POA: Diagnosis not present

## 2016-08-08 NOTE — Assessment & Plan Note (Addendum)
Anticipate viral URI with reactive LAD given short duration and recent sick contact Supportive care reviewed as per instructions.  rec short NSAID course.  Update if not improving with treatment.

## 2016-08-08 NOTE — Patient Instructions (Signed)
I think you have viral upper respiratory infection. Antibiotics are not needed for this.  Viral infections usually take 7-10 days to resolve.   May use ibuprofen 400-600mg  with meals for next several days.  Push fluids and plenty of rest. Please return if you are not improving as expected, or if you have high fevers (>101.5) or difficulty swallowing or worsening productive cough. Call clinic with questions.  Good to see you today. I hope you start feeling better soon.

## 2016-08-08 NOTE — Progress Notes (Addendum)
BP 140/82   Pulse 61   Temp 98.3 F (36.8 C)   Ht '5\' 6"'$  (1.676 m)   Wt 192 lb (87.1 kg)   SpO2 97%   BMI 30.99 kg/m    CC: swollen gland Subjective:    Patient ID: Aaron Stafford, male    DOB: [birth year], 82 y.o.   MRN: [id number]  HPI: Aaron Stafford is a 82 y.o. male presenting on 08/08/2016 for Acute Visit (Left swollen gland in throat-- sore hard place)   Yesterday noticed swollen gland on left side of neck. Today swelling has improved but area staying sore. Ears are itchy, noticing some sinus drainage. Mild cough and sore throat.   Denies fevers/chills, PNDrainage, tooth pain, headache.   Sick contact - great grandson sick earlier this week.   Relevant past medical, surgical, family and social history reviewed and updated as indicated. Interim medical history since our last visit reviewed. Allergies and medications reviewed and updated. Outpatient Medications Prior to Visit  Medication Sig Dispense Refill  . Alpha-Lipoic Acid 600 MG CAPS Take 1 capsule (600 mg total) by mouth 2 (two) times daily.    Marland Kitchen amLODipine (NORVASC) 5 MG tablet Take 1 tablet (5 mg total) by mouth daily. 90 tablet 3  . aspirin 81 MG tablet Take 81 mg by mouth daily.      . Blood Glucose Monitoring Suppl ([place]) w/Device KIT 1 Device by Does not apply route daily. Test blood sugar once daily and as directed. Dx E11.42 1 kit 0  . Cholecalciferol (VITAMIN D3) 1000 UNITS CAPS Take 1 capsule (1,000 Units total) by mouth daily. 30 capsule   . Cranberry 500 MG CAPS Take 4,200 mg by mouth daily.     . finasteride (PROSCAR) 5 MG tablet TAKE 1 TABLET BY MOUTH DAILY 90 tablet 3  . fluticasone (FLONASE) 50 MCG/ACT nasal spray Place 2 sprays into both nostrils daily. 16 g 0  . furosemide (LASIX) 20 MG tablet TAKE 1 TABLET (20 MG TOTAL) BY MOUTH DAILY. 90 tablet 3  . gabapentin (NEURONTIN) 300 MG capsule Take 2 capsules (600 mg total) by mouth 2 (two) times daily. 360 capsule 1  .  hydrochlorothiazide (HYDRODIURIL) 25 MG tablet TAKE 1 TABLET (25 MG TOTAL) BY MOUTH DAILY. 90 tablet 3  . losartan (COZAAR) 25 MG tablet TAKE 1 TABLET (25 MG TOTAL) BY MOUTH DAILY. 90 tablet 3  . metFORMIN (GLUCOPHAGE) 500 MG tablet TAKE 1 TABLET (500 MG TOTAL) BY MOUTH 2 (TWO) TIMES DAILY WITH A MEAL. 180 tablet 3  . metoprolol tartrate (LOPRESSOR) 25 MG tablet TAKE 1/2 TABLET BY MOUTH 2 TIMES DAY 90 tablet 3  . Multiple Vitamin (MULTIVITAMIN) tablet Take 1 tablet by mouth daily.    . nortriptyline (PAMELOR) 25 MG capsule TAKE 1 CAPSULE (25 MG TOTAL) BY MOUTH AT BEDTIME. 30 capsule 6  . ONE TOUCH ULTRA TEST test strip CHECK BLOOD SUGAR ONCE DAILY AND AS INSTRUCTED. DX E11.42 100 each 3  . ONETOUCH DELICA LANCETS 15Q MISC CHECK BLOOD SUGAR ONCE DAILY AND AS INSTRUCTED. DX E11.42 200 each 0  . pravastatin (PRAVACHOL) 40 MG tablet TAKE 1 TABLET (40 MG TOTAL) BY MOUTH AT BEDTIME. 90 tablet 3  . traZODone (DESYREL) 50 MG tablet TAKE 1-1.5 TABLETS (50-75 MG TOTAL) BY MOUTH AT BEDTIME AS NEEDED FOR SLEEP. 90 tablet 1  . vitamin B-12 (CYANOCOBALAMIN) 250 MCG tablet Take 250 mcg by mouth once a week.      No  facility-administered medications prior to visit.      Per HPI unless specifically indicated in ROS section below Review of Systems     Objective:    BP 140/82   Pulse 61   Temp 98.3 F (36.8 C)   Ht '5\' 6"'$  (1.676 m)   Wt 192 lb (87.1 kg)   SpO2 97%   BMI 30.99 kg/m   Wt Readings from Last 3 Encounters:  08/08/16 192 lb (87.1 kg)  06/26/16 198 lb (89.8 kg)  12/28/15 192 lb (87.1 kg)    Physical Exam  Constitutional: He appears well-developed and well-nourished. No distress.  HENT:  Head: Normocephalic and atraumatic.  Right Ear: Hearing, tympanic membrane, external ear and ear canal normal.  Left Ear: Hearing, tympanic membrane, external ear and ear canal normal.  Nose: Nose normal. No mucosal edema or rhinorrhea. Right sinus exhibits no maxillary sinus tenderness and no frontal  sinus tenderness. Left sinus exhibits no maxillary sinus tenderness and no frontal sinus tenderness.  Mouth/Throat: Uvula is midline, oropharynx is clear and moist and mucous membranes are normal. No oropharyngeal exudate, posterior oropharyngeal edema, posterior oropharyngeal erythema or tonsillar abscesses.  Eyes: Conjunctivae and EOM are normal. Pupils are equal, round, and reactive to light. No scleral icterus.  Neck: Normal range of motion. Neck supple.  Cardiovascular: Normal rate, regular rhythm and intact distal pulses.   Murmur (3/6 SEM RUSB) heard. Pulmonary/Chest: Effort normal and breath sounds normal. No respiratory distress. He has no wheezes. He has no rales.  Lymphadenopathy:    He has cervical adenopathy (L AC LAD).  Skin: Skin is warm and dry. No rash noted.  Nursing note and vitals reviewed.  Results for orders placed or performed in visit on 06/26/16  Hemoglobin A1c  Result Value Ref Range   Hgb A1c MFr Bld 6.9 (H) 4.6 - 6.5 %   Lab Results  Component Value Date   CREATININE 0.96 12/26/2015       Assessment & Plan:   Problem List Items Addressed This Visit    Viral URI - Primary    Anticipate viral URI with reactive LAD given short duration and recent sick contact Supportive care reviewed as per instructions.  rec short NSAID course.  Update if not improving with treatment.           Follow up plan: Return if symptoms worsen or fail to improve.  Ria Bush, MD

## 2016-09-09 DIAGNOSIS — E119 Type 2 diabetes mellitus without complications: Secondary | ICD-10-CM | POA: Diagnosis not present

## 2016-09-09 LAB — HM DIABETES EYE EXAM

## 2016-09-17 ENCOUNTER — Encounter: Payer: Self-pay | Admitting: Family Medicine

## 2016-09-18 ENCOUNTER — Other Ambulatory Visit: Payer: Self-pay | Admitting: Family Medicine

## 2016-09-24 DIAGNOSIS — Z872 Personal history of diseases of the skin and subcutaneous tissue: Secondary | ICD-10-CM | POA: Diagnosis not present

## 2016-09-24 DIAGNOSIS — L570 Actinic keratosis: Secondary | ICD-10-CM | POA: Diagnosis not present

## 2016-09-24 DIAGNOSIS — Z86018 Personal history of other benign neoplasm: Secondary | ICD-10-CM | POA: Diagnosis not present

## 2016-09-24 DIAGNOSIS — B001 Herpesviral vesicular dermatitis: Secondary | ICD-10-CM | POA: Diagnosis not present

## 2016-09-24 DIAGNOSIS — Z85820 Personal history of malignant melanoma of skin: Secondary | ICD-10-CM | POA: Diagnosis not present

## 2016-09-24 DIAGNOSIS — L821 Other seborrheic keratosis: Secondary | ICD-10-CM | POA: Diagnosis not present

## 2016-09-24 DIAGNOSIS — Z859 Personal history of malignant neoplasm, unspecified: Secondary | ICD-10-CM | POA: Diagnosis not present

## 2016-09-24 DIAGNOSIS — D485 Neoplasm of uncertain behavior of skin: Secondary | ICD-10-CM | POA: Diagnosis not present

## 2016-10-03 DIAGNOSIS — Z23 Encounter for immunization: Secondary | ICD-10-CM | POA: Diagnosis not present

## 2016-11-13 DIAGNOSIS — B001 Herpesviral vesicular dermatitis: Secondary | ICD-10-CM | POA: Diagnosis not present

## 2016-11-13 DIAGNOSIS — L570 Actinic keratosis: Secondary | ICD-10-CM | POA: Diagnosis not present

## 2016-11-14 ENCOUNTER — Other Ambulatory Visit: Payer: Self-pay | Admitting: Family Medicine

## 2016-12-09 ENCOUNTER — Other Ambulatory Visit: Payer: Self-pay | Admitting: Family Medicine

## 2016-12-28 ENCOUNTER — Other Ambulatory Visit: Payer: Self-pay | Admitting: Family Medicine

## 2017-01-27 ENCOUNTER — Other Ambulatory Visit: Payer: Self-pay | Admitting: Family Medicine

## 2017-02-09 ENCOUNTER — Other Ambulatory Visit: Payer: Self-pay | Admitting: Family Medicine

## 2017-02-26 ENCOUNTER — Other Ambulatory Visit: Payer: Self-pay | Admitting: Family Medicine

## 2017-03-02 ENCOUNTER — Other Ambulatory Visit: Payer: Self-pay | Admitting: Family Medicine

## 2017-03-02 DIAGNOSIS — G6289 Other specified polyneuropathies: Secondary | ICD-10-CM

## 2017-03-02 DIAGNOSIS — E559 Vitamin D deficiency, unspecified: Secondary | ICD-10-CM

## 2017-03-02 DIAGNOSIS — E1142 Type 2 diabetes mellitus with diabetic polyneuropathy: Secondary | ICD-10-CM

## 2017-03-02 DIAGNOSIS — E785 Hyperlipidemia, unspecified: Secondary | ICD-10-CM

## 2017-03-04 ENCOUNTER — Ambulatory Visit (INDEPENDENT_AMBULATORY_CARE_PROVIDER_SITE_OTHER): Payer: Medicare Other

## 2017-03-04 VITALS — BP 130/84 | HR 55 | Temp 97.8°F | Ht 66.0 in | Wt 184.5 lb

## 2017-03-04 DIAGNOSIS — Z0000 Encounter for general adult medical examination without abnormal findings: Secondary | ICD-10-CM

## 2017-03-04 DIAGNOSIS — E559 Vitamin D deficiency, unspecified: Secondary | ICD-10-CM

## 2017-03-04 DIAGNOSIS — E785 Hyperlipidemia, unspecified: Secondary | ICD-10-CM

## 2017-03-04 DIAGNOSIS — E1142 Type 2 diabetes mellitus with diabetic polyneuropathy: Secondary | ICD-10-CM

## 2017-03-04 LAB — COMPREHENSIVE METABOLIC PANEL
ALT: 22 U/L (ref 0–53)
AST: 18 U/L (ref 0–37)
Albumin: 4.3 g/dL (ref 3.5–5.2)
Alkaline Phosphatase: 57 U/L (ref 39–117)
BUN: 16 mg/dL (ref 6–23)
CHLORIDE: 99 meq/L (ref 96–112)
CO2: 30 meq/L (ref 19–32)
Calcium: 9.7 mg/dL (ref 8.4–10.5)
Creatinine, Ser: 0.91 mg/dL (ref 0.40–1.50)
GFR: 84.98 mL/min (ref >60.00)
GLUCOSE: 137 mg/dL — AB (ref 70–99)
POTASSIUM: 3.7 meq/L (ref 3.5–5.1)
SODIUM: 137 meq/L (ref 135–145)
TOTAL PROTEIN: 7.3 g/dL (ref 6.0–8.3)
Total Bilirubin: 0.9 mg/dL (ref 0.2–1.2)

## 2017-03-04 LAB — VITAMIN D 25 HYDROXY (VIT D DEFICIENCY, FRACTURES): VITD: 34.26 ng/mL (ref 30.00–100.00)

## 2017-03-04 LAB — LIPID PANEL
CHOL/HDL RATIO: 4
Cholesterol: 151 mg/dL (ref 0–200)
HDL: 38.8 mg/dL — ABNORMAL LOW (ref >39.00)
NONHDL: 112.21
Triglycerides: 278 mg/dL — ABNORMAL HIGH (ref 0.0–149.0)
VLDL: 55.6 mg/dL — AB (ref 0.0–40.0)

## 2017-03-04 LAB — HEMOGLOBIN A1C: HEMOGLOBIN A1C: 6.8 % — AB (ref 4.6–6.5)

## 2017-03-04 LAB — LDL CHOLESTEROL, DIRECT: LDL DIRECT: 81 mg/dL

## 2017-03-04 NOTE — Progress Notes (Signed)
Pre visit review using our clinic review tool, if applicable. No additional management support is needed unless otherwise documented below in the visit note. 

## 2017-03-04 NOTE — Patient Instructions (Addendum)
Mr. Bisig , Thank you for taking time to come for your Medicare Wellness Visit. I appreciate your ongoing commitment to your health goals. Please review the following plan we discussed and let me know if I can assist you in the future.   These are the goals we discussed: Goals      Patient Stated   . Weight (lb) < 176 lb (79.8 kg) (pt-stated)     Starting 01/03/2016, I will begin walking for 30 min 3 days a week to reach target weight of 175 lbs.       Other   . Follow up with Primary Care Provider     Starting 03/04/2017, I will continue to take medications as prescribed and to keep appointments with PCP as scheduled.        This is a list of the screening recommended for you and due dates:  Health Maintenance  Topic Date Due  . DTaP/Tdap/Td vaccine (1 - Tdap) 04/13/2019*  . Complete foot exam   06/26/2017  . Hemoglobin A1C  09/01/2017  . Eye exam for diabetics  09/09/2017  . Tetanus Vaccine  04/13/2019  . Flu Shot  Completed  . Pneumonia vaccines  Completed  *Topic was postponed. The date shown is not the original due date.   Preventive Care for Adults  A healthy lifestyle and preventive care can promote health and wellness. Preventive health guidelines for adults include the following key practices.  . A routine yearly physical is a good way to check with your health care provider about your health and preventive screening. It is a chance to share any concerns and updates on your health and to receive a thorough exam.  . Visit your dentist for a routine exam and preventive care every 6 months. Brush your teeth twice a day and floss once a day. Good oral hygiene prevents tooth decay and gum disease.  . The frequency of eye exams is based on your age, health, family medical history, use  of contact lenses, and other factors. Follow your health care provider's recommendations for frequency of eye exams.  . Eat a healthy diet. Foods like vegetables, fruits, whole grains,  low-fat dairy products, and lean protein foods contain the nutrients you need without too many calories. Decrease your intake of foods high in solid fats, added sugars, and salt. Eat the right amount of calories for you. Get information about a proper diet from your health care provider, if necessary.  . Regular physical exercise is one of the most important things you can do for your health. Most adults should get at least 150 minutes of moderate-intensity exercise (any activity that increases your heart rate and causes you to sweat) each week. In addition, most adults need muscle-strengthening exercises on 2 or more days a week.  Silver Sneakers may be a benefit available to you. To determine eligibility, you may visit the website: www.silversneakers.com or contact program at (214) [phone] Mon-Fri between 8AM-8PM.   . Maintain a healthy weight. The body mass index (BMI) is a screening tool to identify possible weight problems. It provides an estimate of body fat based on height and weight. Your health care provider can find your BMI and can help you achieve or maintain a healthy weight.   For adults 20 years and older: ? A BMI below 18.5 is considered underweight. ? A BMI of 18.5 to 24.9 is normal. ? A BMI of 25 to 29.9 is considered overweight. ? A BMI of 30 and above  is considered obese.   . Maintain normal blood lipids and cholesterol levels by exercising and minimizing your intake of saturated fat. Eat a balanced diet with plenty of fruit and vegetables. Blood tests for lipids and cholesterol should begin at age 5 and be repeated every 5 years. If your lipid or cholesterol levels are high, you are over 50, or you are at high risk for heart disease, you may need your cholesterol levels checked more frequently. Ongoing high lipid and cholesterol levels should be treated with medicines if diet and exercise are not working.  . If you smoke, find out from your health care provider how to quit. If  you do not use tobacco, please do not start.  . If you choose to drink alcohol, please do not consume more than 2 drinks per day. One drink is considered to be 12 ounces (355 mL) of beer, 5 ounces (148 mL) of wine, or 1.5 ounces (44 mL) of liquor.  . If you are 100-57 years old, ask your health care provider if you should take aspirin to prevent strokes.  . Use sunscreen. Apply sunscreen liberally and repeatedly throughout the day. You should seek shade when your shadow is shorter than you. Protect yourself by wearing long sleeves, pants, a wide-brimmed hat, and sunglasses year round, whenever you are outdoors.  . Once a month, do a whole body skin exam, using a mirror to look at the skin on your back. Tell your health care provider of new moles, moles that have irregular borders, moles that are larger than a pencil eraser, or moles that have changed in shape or color.

## 2017-03-04 NOTE — Progress Notes (Signed)
PCP notes:   Health maintenance:  A1C - completed  Abnormal screenings:   Fall risk - hx of fall with injury and no medical treatment Hearing - failed  Hearing Screening   125Hz  250Hz  500Hz  1000Hz  2000Hz  3000Hz  4000Hz  6000Hz  8000Hz   Right ear:   40 40 40  40    Left ear:   40 0 40  40      Patient concerns:   None  Nurse concerns:  None  Next PCP appt:   03/11/17 @ 1030

## 2017-03-04 NOTE — Progress Notes (Signed)
Subjective:   CORD WILCZYNSKI is a 83 y.o. male who presents for Medicare Annual/Subsequent preventive examination.  Review of Systems:  N/A Cardiac Risk Factors include: advanced age (>88mn, >>47women);male gender;diabetes mellitus;dyslipidemia;hypertension     Objective:    Vitals: BP 130/84 (BP Location: Left Arm, Patient Position: Sitting, Cuff Size: Normal)   Pulse (!) 55   Temp 97.8 F (36.6 C) (Oral)   Ht _0  (1.676 m) Comment: no shoes  Wt 184 lb 8 oz (83.7 kg)   SpO2 94%   BMI 29.78 kg/m   Body mass index is 29.78 kg/m.  Advanced Directives 03/04/2017 12/26/2015  Does Patient Have a Medical Advance Directive? Yes Yes  Type of AParamed[place]of [place] will [place]  Does patient want to make changes to medical advance directive? - No - Patient declined  Copy of [place] Chart? Yes No - copy requested    Tobacco Social History   Tobacco Use  Smoking Status Never Smoker  Smokeless Tobacco Never Used     Counseling given: No   Clinical Intake:  Pre-visit preparation completed: Yes  Pain : No/denies pain Pain Score: 0-No pain     Nutritional Status: BMI > 30  Obese Nutritional Risks: None Diabetes: Yes CBG done?: No Did pt. bring in CBG monitor from home?: No  How often do you need to have someone help you when you read instructions, pamphlets, or other written materials from your doctor or pharmacy?: 1 - Never What is the last grade level you completed in school?: Masters in [employer] CorporationNeeded?: No  Comments: pt lives with spouse Information entered by :: LPinson, LPN  Past Medical History:  Diagnosis Date  . CAD (coronary artery disease)    with stents  . Chronic rhinitis    [facility] [place] Diabetes mellitus type II   . Diverticulosis 06/2011   by CT and colonoscopy  . Fatty liver 06/2011   by CT  . GERD (gastroesophageal reflux disease)   .  History of BPH   . History of shingles   . HLD (hyperlipidemia)   . HTN (hypertension)   . Ischemic heart disease   . [place] 12[place]   L forearm, scalp  . [place] ([place]ocardial infarction) [place]  . Shingles rash 09/26/2012   Past Surgical History:  Procedure Laterality Date  . ANKLE SURGERY  03/2002   Left ankle fracture  . CAROTID STENT  07/2009   RCA stent  . CHOLECYSTECTOMY  1985  . COLONOSCOPY  03/2005   diverticulosis, tortuous colon with looping ([name]  . CORONARY ARTERY BYPASS GRAFT  05/1993   x6 MI Attemped angioplasty  . CYSTOSCOPY  10/03/08   Normal-Dr. SBernardo Heater . INGUINAL HERNIA REPAIR  11/2000   and ventral hernia repair   Family History  Problem Relation Age of Onset  . Kidney failure Mother   . Coronary artery disease Mother   . Heart failure Mother   . Hypertension Brother   . Heart attack Brother   . Alcohol abuse Brother   . Hypertension Brother   . Hypertension Sister    Social History   Socioeconomic History  . Marital status: Married    Spouse name: None  . Number of children: 5  . Years of education: None  . Highest education level: None  Social Needs  . Financial resource strain: None  . Food insecurity - worry: None  .  Food insecurity - inability: None  . Transportation needs - medical: None  . Transportation needs - non-medical: None  Occupational History  . Occupation: Pastor  Tobacco Use  . Smoking status: Never Smoker  . Smokeless tobacco: Never Used  Substance and Sexual Activity  . Alcohol use: No    Alcohol/week: 0.0 oz  . Drug use: No  . Sexual activity: Not Currently  Other Topics Concern  . 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 Alpha-Lipoic Acid 600 MG CAPS Take 1 capsule (600 mg total) by mouth 2 (two) times daily.  Marland Kitchen amLODipine (NORVASC) 5 MG tablet Take 1 tablet (5 mg total) by mouth daily.  Marland Kitchen aspirin 81 MG tablet Take 81 mg by mouth daily.    . Blood Glucose Monitoring Suppl ([place]) w/Device KIT 1 Device by Does not apply route daily. Test blood sugar once daily and as directed. Dx Y18.56  . Cholecalciferol (VITAMIN D3) 1000 UNITS CAPS Take 1 capsule (1,000 Units total) by mouth daily. (Patient taking differently: Take 1 capsule by mouth as needed. )  . Cranberry 500 MG CAPS Take 4,200 mg by mouth daily.   . finasteride (PROSCAR) 5 MG tablet TAKE 1 TABLET BY MOUTH DAILY  . fluticasone (FLONASE) 50 MCG/ACT nasal spray Place 2 sprays into both nostrils daily.  . furosemide (LASIX) 20 MG tablet TAKE 1 TABLET (20 MG TOTAL) BY MOUTH DAILY.  Marland Kitchen gabapentin (NEURONTIN) 300 MG capsule Take 2 capsules (600 mg total) by mouth 2 (two) times daily.  . hydrochlorothiazide (HYDRODIURIL) 25 MG tablet TAKE 1 TABLET (25 MG TOTAL) BY MOUTH DAILY.  Marland Kitchen losartan (COZAAR) 25 MG tablet TAKE 1 TABLET (25 MG TOTAL) BY MOUTH DAILY.  . metFORMIN (GLUCOPHAGE) 500 MG tablet TAKE 1 TABLET (500 MG TOTAL) BY MOUTH 2 (TWO) TIMES DAILY WITH A MEAL.  . metoprolol tartrate (LOPRESSOR) 25 MG tablet TAKE 1/2 TABLET BY MOUTH 2 TIMES DAY  . Multiple Vitamin (MULTIVITAMIN) tablet Take 1 tablet by mouth daily.  . ONE TOUCH ULTRA TEST test strip CHECK BLOOD SUGAR ONCE DAILY AND AS INSTRUCTED. DX E11.42  . ONETOUCH DELICA LANCETS 31S MISC CHECK BLOOD SUGAR ONCE DAILY AND AS INSTRUCTED. DX E11.42  . pravastatin (PRAVACHOL) 40 MG tablet TAKE 1 TABLET (40 MG TOTAL) BY MOUTH AT BEDTIME.  . traZODone (DESYREL) 50 MG tablet TAKE 1-1.5 TABLETS (50-75 MG TOTAL) BY MOUTH AT BEDTIME AS NEEDED FOR SLEEP.  Marland Kitchen nortriptyline (PAMELOR) 25 MG capsule TAKE 1 CAPSULE (25 MG TOTAL) BY MOUTH AT BEDTIME. (Patient not taking: Reported on 03/04/2017)  . vitamin  B-12 (CYANOCOBALAMIN) 250 MCG tablet Take 250 mcg by mouth once a week.    No facility-administered encounter medications on file as of 03/04/2017.     Activities of Daily Living In your present state of health, do you have any difficulty performing the following activities: 03/04/2017  Hearing? N  Vision? N  Difficulty concentrating or making decisions? N  Walking or climbing stairs? N  Dressing or bathing? N  Doing errands, shopping? N  Preparing Food and eating ? N  Using the Toilet? N  In the past six months, have you  accidently leaked urine? N  Do you have problems with loss of bowel control? N  Managing your Medications? N  Managing your Finances? N  Housekeeping or managing your Housekeeping? N  Some recent data might be hidden    Patient Care Team: Ria Bush, MD as PCP - General (Family Medicine) Kem Parkinson, MD as Consulting Physician (Ophthalmology) Minna Merritts, MD as Consulting Physician (Cardiology)   Assessment:   This is a routine wellness examination for [facility].   Hearing Screening   _0  _1  _2  _3  _4  _5  _6  _7  _8   Right ear:   40 40 40  40    Left ear:   40 0 40  40    Vision Screening Comments: Last vision exam in 2018 @ [facility] [place] and Dietary recommendations Current Exercise Habits: The patient does not participate in regular exercise at present, Exercise limited by: None identified  Goals      Patient Stated   . Weight (lb) < 176 lb (79.8 kg) (pt-stated)     Starting 01/03/2016, I will begin walking for 30 min 3 days a week to reach target weight of 175 lbs.       Other   . Follow up with Primary Care Provider     Starting 03/04/2017, I will continue to take medications as prescribed and to keep appointments with PCP as scheduled.        Fall Risk Fall Risk  03/04/2017 12/26/2015 12/14/2014 08/09/2013 03/25/2012  Falls in the past year? Yes No No No No  Comment fell  off a chair and hit back of head; no medical treatment - - - -  Number falls in past yr: 1 - - - -  Injury with Fall? Yes - - - -   Depression Screen PHQ 2/9 Scores 03/04/2017 12/26/2015 12/14/2014 08/09/2013  PHQ - 2 Score 0 0 0 0  PHQ- 9 Score 0 - - -    Cognitive Function MMSE - Mini Mental State Exam 03/04/2017 12/26/2015  Orientation to time 5 5  Orientation to Place 5 5  Registration 3 3  Attention/ Calculation 0 0  Recall 3 3  Language- name 2 objects 0 0  Language- repeat 1 1  Language- follow 3 step command 3 3  Language- read & follow direction 0 0  Write a sentence 0 0  Copy design 0 0  Total score 20 20       PLEASE NOTE: A Mini-Cog screen was completed. Maximum score is 20. A value of 0 denotes this part of Folstein MMSE was not completed or the patient failed this part of the Mini-Cog screening.   Mini-Cog Screening Orientation to Time - Max 5 pts Orientation to Place - Max 5 pts Registration - Max 3 pts Recall - Max 3 pts Language Repeat - Max 1 pts Language Follow 3 Step Command - Max 3 pts   Immunization History  Administered Date(s) Administered  . Influenza Split 11/21/2011  . Influenza Whole 11/19/2003, 03/01/2009  . Influenza, High Dose Seasonal PF 10/03/2016  . Influenza,inj,Quad PF,6+ Mos 11/23/2012, 02/15/2014, 12/14/2014, 12/21/2015  . Pneumococcal Conjugate-13 08/09/2013  . Pneumococcal Polysaccharide-23 05/10/2004  . Td 12/26/1997, 04/12/2009  . Zoster 11/15/2010    Screening Tests Health Maintenance  Topic Date Due  . DTaP/Tdap/Td (1 - Tdap) 04/13/2019 (Originally 04/13/2009)  . FOOT EXAM  06/26/2017  . HEMOGLOBIN A1C  09/01/2017  . OPHTHALMOLOGY EXAM  09/09/2017  . TETANUS/TDAP  04/13/2019  .  INFLUENZA VACCINE  Completed  . PNA vac Low Risk Adult  Completed   Plan:     I have personally reviewed, addressed, and noted the following in the patient's chart:  A. Medical and social history B. Use of alcohol, tobacco or illicit drugs    C. Current medications and supplements D. Functional ability and status E.  Nutritional status F.  Physical activity G. Advance directives H. List of other physicians I.  Hospitalizations, surgeries, and ER visits in previous 12 months J.  [place] to include hearing, vision, cognitive, depression L. Referrals and appointments - none  In addition, I have reviewed and discussed with patient certain preventive protocols, quality metrics, and best practice recommendations. A written personalized care plan for preventive services as well as general preventive health recommendations were provided to patient.  See attached scanned questionnaire for additional information.   Signed,   Lindell Noe, MHA, BS, LPN Health Coach

## 2017-03-04 NOTE — Progress Notes (Signed)
I reviewed health advisor's note, was available for consultation, and agree with documentation and plan.  

## 2017-03-10 DIAGNOSIS — H35341 Macular cyst, hole, or pseudohole, right eye: Secondary | ICD-10-CM | POA: Diagnosis not present

## 2017-03-10 DIAGNOSIS — H2513 Age-related nuclear cataract, bilateral: Secondary | ICD-10-CM | POA: Diagnosis not present

## 2017-03-11 ENCOUNTER — Ambulatory Visit (INDEPENDENT_AMBULATORY_CARE_PROVIDER_SITE_OTHER): Payer: Medicare Other | Admitting: Family Medicine

## 2017-03-11 ENCOUNTER — Encounter: Payer: Self-pay | Admitting: Family Medicine

## 2017-03-11 VITALS — BP 132/70 | HR 68 | Temp 98.1°F | Ht 66.0 in | Wt 187.0 lb

## 2017-03-11 DIAGNOSIS — E669 Obesity, unspecified: Secondary | ICD-10-CM | POA: Diagnosis not present

## 2017-03-11 DIAGNOSIS — G4700 Insomnia, unspecified: Secondary | ICD-10-CM

## 2017-03-11 DIAGNOSIS — Z7189 Other specified counseling: Secondary | ICD-10-CM

## 2017-03-11 DIAGNOSIS — E785 Hyperlipidemia, unspecified: Secondary | ICD-10-CM

## 2017-03-11 DIAGNOSIS — I10 Essential (primary) hypertension: Secondary | ICD-10-CM

## 2017-03-11 DIAGNOSIS — E1142 Type 2 diabetes mellitus with diabetic polyneuropathy: Secondary | ICD-10-CM | POA: Diagnosis not present

## 2017-03-11 DIAGNOSIS — Z951 Presence of aortocoronary bypass graft: Secondary | ICD-10-CM

## 2017-03-11 DIAGNOSIS — R3915 Urgency of urination: Secondary | ICD-10-CM

## 2017-03-11 DIAGNOSIS — E559 Vitamin D deficiency, unspecified: Secondary | ICD-10-CM | POA: Diagnosis not present

## 2017-03-11 DIAGNOSIS — E66811 Obesity, class 1: Secondary | ICD-10-CM

## 2017-03-11 DIAGNOSIS — G6289 Other specified polyneuropathies: Secondary | ICD-10-CM | POA: Diagnosis not present

## 2017-03-11 LAB — HM DIABETES EYE EXAM

## 2017-03-11 LAB — POC URINALSYSI DIPSTICK (AUTOMATED)
BILIRUBIN UA: NEGATIVE
Glucose, UA: NEGATIVE
KETONES UA: NEGATIVE
LEUKOCYTES UA: NEGATIVE
NITRITE UA: NEGATIVE
PH UA: 6 (ref 5.0–8.0)
Protein, UA: NEGATIVE
RBC UA: NEGATIVE
Spec Grav, UA: 1.015 (ref 1.010–1.025)
Urobilinogen, UA: 0.2 E.U./dL

## 2017-03-11 MED ORDER — NORTRIPTYLINE HCL 25 MG PO CAPS: 25.0000 mg | ORAL_CAPSULE | Freq: Every day | ORAL | 6 refills | Status: DC

## 2017-03-11 NOTE — Assessment & Plan Note (Signed)
Chronic, stable. Continue current regimen. 

## 2017-03-11 NOTE — Assessment & Plan Note (Signed)
Discussed healthy diet and lifestyle changes to affect sustainable weight loss. He is motivated for healthy diet changes.

## 2017-03-11 NOTE — Assessment & Plan Note (Signed)
Chronic, continue statin. Trig levels high - reviewed diet choices to improve control.  The ASCVD Risk score Mikey Bussing DC Jr., et al., 2013) failed to calculate for the following reasons:   The 2013 ASCVD risk score is only valid for ages 69 to 89

## 2017-03-11 NOTE — ACP (Advance Care Planning) (Signed)
Advanced planning - scanned into chart 06/2014. HCPOA is wife Aaron Stafford then granddaughter Blair RN. Ok with tube feeds. Does not want prolonged life support.  

## 2017-03-11 NOTE — Assessment & Plan Note (Addendum)
Chronic, stable. Continue current regimen of metformin 500mg  bid. Has not tried alpha lipoic acid yet.

## 2017-03-11 NOTE — Assessment & Plan Note (Signed)
Discussed transition from trazodone to pamelor.

## 2017-03-11 NOTE — Assessment & Plan Note (Signed)
Continue aspirin, statin.  

## 2017-03-11 NOTE — Patient Instructions (Addendum)
Urinalysis today.  Try some alpha lipoic acid supplements 600mg  twice daily for neuropathy. Restart nortriptyline (pamelor) for neuropathic pain, continue gabapentin.  Decrease trazodone to 1/2-1 tablet at bedtime then try to taper off. If interested, check with pharmacy about new 2 shot shingles series (shingrix).  You are doing well today Return as needed or in 6 months for follow up visit.   Health Maintenance, Male A healthy lifestyle and preventive care is important for your health and wellness. Ask your health care provider about what schedule of regular examinations is right for you. What should I know about weight and diet? Eat a Healthy Diet  Eat plenty of vegetables, fruits, whole grains, low-fat dairy products, and lean protein.  Do not eat a lot of foods high in solid fats, added sugars, or salt.  Maintain a Healthy Weight Regular exercise can help you achieve or maintain a healthy weight. You should:  Do at least 150 minutes of exercise each week. The exercise should increase your heart rate and make you sweat (moderate-intensity exercise).  Do strength-training exercises at least twice a week.  Watch Your Levels of Cholesterol and Blood Lipids  Have your blood tested for lipids and cholesterol every 5 years starting at 81 years of age. If you are at high risk for heart disease, you should start having your blood tested when you are 81 years old. You may need to have your cholesterol levels checked more often if: ? Your lipid or cholesterol levels are high. ? You are older than 81 years of age. ? You are at high risk for heart disease.  What should I know about cancer screening? Many types of cancers can be detected early and may often be prevented. Lung Cancer  You should be screened every year for lung cancer if: ? You are a current smoker who has smoked for at least 30 years. ? You are a former smoker who has quit within the past 15 years.  Talk to your health  care provider about your screening options, when you should start screening, and how often you should be screened.  Colorectal Cancer  Routine colorectal cancer screening usually begins at 81 years of age and should be repeated every 5-10 years until you are 81 years old. You may need to be screened more often if early forms of precancerous polyps or small growths are found. Your health care provider may recommend screening at an earlier age if you have risk factors for colon cancer.  Your health care provider may recommend using home test kits to check for hidden blood in the stool.  A small camera at the end of a tube can be used to examine your colon (sigmoidoscopy or colonoscopy). This checks for the earliest forms of colorectal cancer.  Prostate and Testicular Cancer  Depending on your age and overall health, your health care provider may do certain tests to screen for prostate and testicular cancer.  Talk to your health care provider about any symptoms or concerns you have about testicular or prostate cancer.  Skin Cancer  Check your skin from head to toe regularly.  Tell your health care provider about any new moles or changes in moles, especially if: ? There is a change in a mole's size, shape, or color. ? You have a mole that is larger than a pencil eraser.  Always use sunscreen. Apply sunscreen liberally and repeat throughout the day.  Protect yourself by wearing long sleeves, pants, a wide-brimmed hat, and sunglasses  when outside.  What should I know about heart disease, diabetes, and high blood pressure?  If you are 66-37 years of age, have your blood pressure checked every 3-5 years. If you are 24 years of age or older, have your blood pressure checked every year. You should have your blood pressure measured twice-once when you are at a hospital or clinic, and once when you are not at a hospital or clinic. Record the average of the two measurements. To check your blood  pressure when you are not at a hospital or clinic, you can use: ? An automated blood pressure machine at a pharmacy. ? A home blood pressure monitor.  Talk to your health care provider about your target blood pressure.  If you are between 90-87 years old, ask your health care provider if you should take aspirin to prevent heart disease.  Have regular diabetes screenings by checking your fasting blood sugar level. ? If you are at a normal weight and have a low risk for diabetes, have this test once every three years after the age of 32. ? If you are overweight and have a high risk for diabetes, consider being tested at a younger age or more often.  A one-time screening for abdominal aortic aneurysm (AAA) by ultrasound is recommended for men aged 23-75 years who are current or former smokers. What should I know about preventing infection? Hepatitis B If you have a higher risk for hepatitis B, you should be screened for this virus. Talk with your health care provider to find out if you are at risk for hepatitis B infection. Hepatitis C Blood testing is recommended for:  Everyone born from [birth year] through 1963.  Anyone with known risk factors for hepatitis C.  Sexually Transmitted Diseases (STDs)  You should be screened each year for STDs including gonorrhea and chlamydia if: ? You are sexually active and are younger than 81 years of age. ? You are older than 81 years of age and your health care provider tells you that you are at risk for this type of infection. ? Your sexual activity has changed since you were last screened and you are at an increased risk for chlamydia or gonorrhea. Ask your health care provider if you are at risk.  Talk with your health care provider about whether you are at high risk of being infected with HIV. Your health care provider may recommend a prescription medicine to help prevent HIV infection.  What else can I do?  Schedule regular health, dental, and eye  exams.  Stay current with your vaccines (immunizations).  Do not use any tobacco products, such as cigarettes, chewing tobacco, and e-cigarettes. If you need help quitting, ask your health care provider.  Limit alcohol intake to no more than 2 drinks per day. One drink equals 12 ounces of beer, 5 ounces of wine, or 1 ounces of hard liquor.  Do not use street drugs.  Do not share needles.  Ask your health care provider for help if you need support or information about quitting drugs.  Tell your health care provider if you often feel depressed.  Tell your health care provider if you have ever been abused or do not feel safe at home. This information is not intended to replace advice given to you by your health care provider. Make sure you discuss any questions you have with your health care provider. Document Released: 08/03/2007 Document Revised: 10/04/2015 Document Reviewed: 11/08/2014 Elsevier Interactive Patient Education  2018 Elsevier  Inc.  

## 2017-03-11 NOTE — Assessment & Plan Note (Addendum)
Deterioration. Has not tried alpha lipoic acid, he had stopped nortriptyline. Will restart both, taper off trazodone. Discussed neuro referral if no better, to consider NCS

## 2017-03-11 NOTE — Assessment & Plan Note (Signed)
Continue 1000 IU daily. 

## 2017-03-11 NOTE — Assessment & Plan Note (Addendum)
With endorsed foul smell, but no discharge, dysuria, or skin changes, and benign exam today. Will check UA today.

## 2017-03-11 NOTE — Assessment & Plan Note (Signed)
Advanced planning - scanned into chart 06/2014. HCPOA is wife Judy then granddaughter Blair RN. Ok with tube feeds. Does not want prolonged life support.  

## 2017-03-11 NOTE — Addendum Note (Signed)
Addended by: Brenton Grills on: 8/63/8177 11:65 AM   Modules accepted: Orders

## 2017-03-11 NOTE — Progress Notes (Signed)
BP 132/70 (BP Location: Left Arm, Patient Position: Sitting, Cuff Size: Normal)   Pulse 68   Temp 98.1 F (36.7 C) (Oral)   Ht '5\' 6"'$  (1.676 m)   Wt 187 lb (84.8 kg)   SpO2 96%   BMI 30.18 kg/m    CC: AMW f/u visit Subjective:    Patient ID: Aaron Stafford, male    DOB: May 27, 1934, 83 y.o.   MRN: [id number]  HPI: Aaron Stafford is a 83 y.o. male presenting on 03/11/2017 for Annual Exam (Pt 2)   Saw Katha Cabal last week for medicare wellness visit. Note reviewed. [place] off chair while doing work in [employer]. Injury - did not seek care. Now feeling better. Working on [employer].  Progressive neuropathy - oingoing trouble - takes gabapentin '900mg'$  at bedtime. Has not tried alpha lipoic acid.  Saw ophtho yesterday.   Noticing some foul smell at tip of penis without discharge or drainage or urinary symptoms (dysuria, frequency, hematuria, or incomplete emptying). Endorses strong stream. Increased urgency with some accidents noted.   Preventative: COLONOSCOPY Date: 03/2005 diverticulosis, tortuous colon with looping [name]). Cologuard negative 2017. Prostate - h/o BPH, takes finasteride. No h/o prostate cancer. Stopped DRE, intermittent PSA.  Flu shot yearly Pneumovax 2006. prevnar - 07/2013 Td 2011 zostavax 2012 shingrix - discussed Advanced planning - scanned into chart 06/2014. HCPOA is wife [name] then granddaughter [name]. Ok with tube feeds. Does not want prolonged life support Seat belt use discussed.  Sunscreen use discussed. H/o melanoma removed from scalp. Sees derm regularly.  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 Interim medical history since our last visit  reviewed. Allergies and medications reviewed and updated. Outpatient Medications Prior to Visit  Medication Sig Dispense Refill  . Alpha-Lipoic Acid 600 MG CAPS Take 1 capsule (600 mg total) by mouth 2 (two) times daily.    Marland Kitchen amLODipine (NORVASC) 5 MG tablet Take 1 tablet (5 mg total) by mouth daily. 90 tablet 3  . aspirin 81 MG tablet Take 81 mg by mouth daily.      . Blood Glucose Monitoring Suppl ([place]) w/Device KIT 1 Device by Does not apply route daily. Test blood sugar once daily and as directed. Dx E11.42 1 kit 0  . Cholecalciferol (VITAMIN D3) 1000 UNITS CAPS Take 1 capsule (1,000 Units total) by mouth daily. (Patient taking differently: Take 1 capsule by mouth as needed. ) 30 capsule   . Cranberry 500 MG CAPS Take 4,200 mg by mouth daily.     . finasteride (PROSCAR) 5 MG tablet TAKE 1 TABLET BY MOUTH DAILY 90 tablet 0  . fluticasone (FLONASE) 50 MCG/ACT nasal spray Place 2 sprays into both nostrils daily. 16 g 0  . furosemide (LASIX) 20 MG tablet TAKE 1 TABLET (20 MG TOTAL) BY MOUTH DAILY. 90 tablet 0  . gabapentin (NEURONTIN) 300 MG capsule Take 2 capsules (600 mg total) by mouth 2 (two) times daily. 360 capsule 1  . hydrochlorothiazide (HYDRODIURIL) 25 MG tablet TAKE 1 TABLET (25 MG TOTAL) BY MOUTH DAILY. 90 tablet 0  . losartan (COZAAR) 25 MG tablet TAKE 1 TABLET (25 MG TOTAL) BY MOUTH DAILY. 90 tablet 0  .  metFORMIN (GLUCOPHAGE) 500 MG tablet TAKE 1 TABLET (500 MG TOTAL) BY MOUTH 2 (TWO) TIMES DAILY WITH A MEAL. 180 tablet 0  . metoprolol tartrate (LOPRESSOR) 25 MG tablet TAKE 1/2 TABLET BY MOUTH 2 TIMES DAY 90 tablet 0  . Multiple Vitamin (MULTIVITAMIN) tablet Take 1 tablet by mouth daily.    . ONE TOUCH ULTRA TEST test strip CHECK BLOOD SUGAR ONCE DAILY AND AS INSTRUCTED. DX E11.42 100 each 3  . ONETOUCH DELICA LANCETS 73U MISC CHECK BLOOD SUGAR ONCE DAILY AND AS INSTRUCTED. DX E11.42 200 each 0  . pravastatin (PRAVACHOL) 40 MG tablet TAKE 1 TABLET (40 MG  TOTAL) BY MOUTH AT BEDTIME. 90 tablet 0  . traZODone (DESYREL) 50 MG tablet Take 0.5-1 tablets (25-50 mg total) by mouth at bedtime as needed for sleep.    . vitamin B-12 (CYANOCOBALAMIN) 250 MCG tablet Take 250 mcg by mouth once a week.     . nortriptyline (PAMELOR) 25 MG capsule TAKE 1 CAPSULE (25 MG TOTAL) BY MOUTH AT BEDTIME. 30 capsule 6  . traZODone (DESYREL) 50 MG tablet TAKE 1-1.5 TABLETS (50-75 MG TOTAL) BY MOUTH AT BEDTIME AS NEEDED FOR SLEEP. 90 tablet 0   No facility-administered medications prior to visit.      Per HPI unless specifically indicated in ROS section below Review of Systems     Objective:    BP 132/70 (BP Location: Left Arm, Patient Position: Sitting, Cuff Size: Normal)   Pulse 68   Temp 98.1 F (36.7 C) (Oral)   Ht '5\' 6"'$  (1.676 m)   Wt 187 lb (84.8 kg)   SpO2 96%   BMI 30.18 kg/m   Wt Readings from Last 3 Encounters:  03/11/17 187 lb (84.8 kg)  03/04/17 184 lb 8 oz (83.7 kg)  08/08/16 192 lb (87.1 kg)    Physical Exam  Constitutional: He is oriented to person, place, and time. He appears well-developed and well-nourished. No distress.  HENT:  Head: Normocephalic and atraumatic.  Right Ear: Hearing, tympanic membrane, external ear and ear canal normal.  Left Ear: Hearing, tympanic membrane, external ear and ear canal normal.  Nose: Nose normal.  Mouth/Throat: Uvula is midline, oropharynx is clear and moist and mucous membranes are normal. No oropharyngeal exudate, posterior oropharyngeal edema or posterior oropharyngeal erythema.  Eyes: Conjunctivae and EOM are normal. Pupils are equal, round, and reactive to light. No scleral icterus.  Neck: Normal range of motion. Neck supple. No thyromegaly present.  Cardiovascular: Normal rate, regular rhythm, normal heart sounds and intact distal pulses.  No murmur heard. Pulses:      Radial pulses are 2+ on the right side, and 2+ on the left side.  Pulmonary/Chest: Effort normal and breath sounds normal. No  respiratory distress. He has no wheezes. He has no rales.  Abdominal: Soft. Bowel sounds are normal. He exhibits no distension and no mass. There is no tenderness. There is no rebound and no guarding. A hernia is present. Hernia confirmed positive in the left inguinal area. Hernia confirmed negative in the right inguinal area.  Genitourinary: Penis normal. Right testis shows no mass, no swelling and no tenderness. Right testis is descended. Left testis shows no mass, no swelling and no tenderness. Left testis is descended. Uncircumcised. No phimosis, paraphimosis, penile erythema or penile tenderness. No discharge found.  Musculoskeletal: Normal range of motion. He exhibits no edema.  Lymphadenopathy:    He has no cervical adenopathy.       Right: No inguinal adenopathy present.  Left: No inguinal adenopathy present.  Neurological: He is alert and oriented to person, place, and time.  CN grossly intact, station and gait intact  Skin: Skin is warm and dry. No rash noted.  Psychiatric: He has a normal mood and affect. His behavior is normal. Judgment and thought content normal.  Nursing note and vitals reviewed.  Results for orders placed or performed in visit on 03/04/17  Hemoglobin A1c  Result Value Ref Range   Hgb A1c MFr Bld 6.8 (H) 4.6 - 6.5 %  Comprehensive metabolic panel  Result Value Ref Range   Sodium 137 135 - 145 mEq/L   Potassium 3.7 3.5 - 5.1 mEq/L   Chloride 99 96 - 112 mEq/L   CO2 30 19 - 32 mEq/L   Glucose, Bld 137 (H) 70 - 99 mg/dL   BUN 16 6 - 23 mg/dL   Creatinine, Ser 0.91 0.40 - 1.50 mg/dL   Total Bilirubin 0.9 0.2 - 1.2 mg/dL   Alkaline Phosphatase 57 39 - 117 U/L   AST 18 0 - 37 U/L   ALT 22 0 - 53 U/L   Total Protein 7.3 6.0 - 8.3 g/dL   Albumin 4.3 3.5 - 5.2 g/dL   Calcium 9.7 8.4 - 10.5 mg/dL   GFR 84.98 >60.00 mL/min  Lipid panel  Result Value Ref Range   Cholesterol 151 0 - 200 mg/dL   Triglycerides 278.0 (H) 0.0 - 149.0 mg/dL   HDL 38.80 (L)  >39.00 mg/dL   VLDL 55.6 (H) 0.0 - 40.0 mg/dL   Total CHOL/HDL Ratio 4    NonHDL 112.21   VITAMIN D 25 Hydroxy (Vit-D Deficiency, Fractures)  Result Value Ref Range   VITD 34.26 30.00 - 100.00 ng/mL  LDL cholesterol, direct  Result Value Ref Range   Direct LDL 81.0 mg/dL      Assessment & Plan:   Problem List Items Addressed This Visit    Advanced care planning/counseling discussion    Advanced planning - scanned into chart 06/2014. HCPOA is wife [name] then granddaughter [name]. Ok with tube feeds. Does not want prolonged life support      HLD (hyperlipidemia)    Chronic, continue statin. Trig levels high - reviewed diet choices to improve control.  The ASCVD Risk score Mikey Bussing DC Jr., et al., 2013) failed to calculate for the following reasons:   The 2013 ASCVD risk score is only valid for ages 21 to 71       HTN (hypertension)    Chronic, stable. Continue current regimen.      Insomnia    Discussed transition from trazodone to pamelor.      Obesity, Class I, BMI 30-34.9    Discussed healthy diet and lifestyle changes to affect sustainable weight loss. He is motivated for healthy diet changes.      Peripheral neuropathy    Deterioration. Has not tried alpha lipoic acid, he had stopped nortriptyline. Will restart both, taper off trazodone. Discussed neuro referral if no better, to consider NCS      Relevant Medications   nortriptyline (PAMELOR) 25 MG capsule   traZODone (DESYREL) 50 MG tablet   S/P CABG (coronary artery bypass graft)    Continue aspirin, statin.       Type 2 diabetes, controlled, with peripheral neuropathy (HCC)    Chronic, stable. Continue current regimen of metformin '500mg'$  bid. Has not tried alpha lipoic acid yet.       Relevant Medications   nortriptyline (PAMELOR) 25 MG  capsule   traZODone (DESYREL) 50 MG tablet   Urinary urgency - Primary    With endorsed foul smell, but no discharge, dysuria, or skin changes, and benign exam today. Will  check UA today.       Vitamin D deficiency    Continue 1000 IU daily.           Follow up plan: Return in about 6 months (around 09/08/2017) for follow up visit.  Ria Bush, MD

## 2017-03-12 ENCOUNTER — Encounter: Payer: Self-pay | Admitting: Family Medicine

## 2017-03-29 ENCOUNTER — Other Ambulatory Visit: Payer: Self-pay | Admitting: Family Medicine

## 2017-04-23 ENCOUNTER — Encounter: Payer: Self-pay | Admitting: Family Medicine

## 2017-04-23 ENCOUNTER — Ambulatory Visit (INDEPENDENT_AMBULATORY_CARE_PROVIDER_SITE_OTHER): Payer: Medicare Other | Admitting: Family Medicine

## 2017-04-23 VITALS — BP 132/64 | HR 61 | Temp 98.2°F | Ht 66.0 in | Wt 187.8 lb

## 2017-04-23 DIAGNOSIS — J069 Acute upper respiratory infection, unspecified: Secondary | ICD-10-CM | POA: Diagnosis not present

## 2017-04-23 DIAGNOSIS — B9789 Other viral agents as the cause of diseases classified elsewhere: Secondary | ICD-10-CM

## 2017-04-23 DIAGNOSIS — J019 Acute sinusitis, unspecified: Secondary | ICD-10-CM | POA: Insufficient documentation

## 2017-04-23 MED ORDER — BENZONATATE 200 MG PO CAPS: 200.0000 mg | ORAL_CAPSULE | Freq: Three times a day (TID) | ORAL | 1 refills | Status: DC | PRN

## 2017-04-23 NOTE — Progress Notes (Signed)
 Subjective:    Patient ID: Aaron Stafford, male    DOB: 11/14/1934, 83 y.o.   MRN: [id number]  HPI Here for uri symptoms incl cough/cong and ST  Started yesterday  HA ST - scratchy and raw -not severe  Raspy voice  Coughing - productive (phlegm is light grey)  No fever  Felt cold /no body aches   Took chlorcedin and that helped quite a bit  ? Sick exp   Is a pastor  Today is a bit better   Headache is better today    Temp: 98.2 F (36.8 C)    Patient Active Problem List   Diagnosis Date Noted  . Viral URI with cough 04/23/2017  . Urinary urgency 03/11/2017  . Peripheral neuropathy 10/05/2015  . Advanced care planning/counseling discussion 06/26/2014  . Trigger finger, acquired 02/15/2014  . Stress 08/09/2013  . BPH with obstruction/lower urinary tract symptoms   . Medicare annual wellness visit, subsequent 03/25/2011  . S/P CABG (coronary artery bypass graft) 06/07/2010  . HTN (hypertension)   . HLD (hyperlipidemia)   . Type 2 diabetes, controlled, with peripheral neuropathy (HCC)   . GERD (gastroesophageal reflux disease)   . Chronic rhinitis   . Vitamin D deficiency 04/11/2008  . Insomnia 03/14/2008  . INCONTINENCE, URGE 07/07/2006  . Obesity, Class I, BMI 30-34.9 07/03/2006  . ERECTILE DYSFUNCTION 07/01/2006  . Coronary atherosclerosis 07/01/2006  . IRRITABLE BOWEL SYNDROME 07/01/2006   Past Medical History:  Diagnosis Date  . CAD (coronary artery disease)    with stents  . Chronic rhinitis    ENT [place]  . Diabetes mellitus type II   . Diverticulosis 06/2011   by CT and colonoscopy  . Fatty liver 06/2011   by CT  . GERD (gastroesophageal reflux disease)   . History of BPH   . History of shingles   . HLD (hyperlipidemia)   . HTN (hypertension)   . Ischemic heart disease   . Melanoma (HCC) 12/2015   L forearm, scalp  . MI (myocardial infarction) (HCC) 1995  . Shingles rash 09/26/2012   Past Surgical History:  Procedure Laterality Date  .  ANKLE SURGERY  03/2002   Left ankle fracture  . CAROTID STENT  07/2009   RCA stent  . CHOLECYSTECTOMY  1985  . COLONOSCOPY  03/2005   diverticulosis, tortuous colon with looping ([place])  . CORONARY ARTERY BYPASS GRAFT  05/1993   x6 MI Attemped angioplasty  . CYSTOSCOPY  10/03/08   Normal-Dr. Stoioff  . INGUINAL HERNIA REPAIR  11/2000   and ventral hernia repair   Social History   Tobacco Use  . Smoking status: Never Smoker  . Smokeless tobacco: Never Used  Substance Use Topics  . Alcohol use: No    Alcohol/week: 0.0 oz  . Drug use: No   Family History  Problem Relation Age of Onset  . Kidney failure Mother   . Coronary artery disease Mother   . Heart failure Mother   . Hypertension Brother   . Heart attack Brother   . Alcohol abuse Brother   . Hypertension Brother   . Hypertension Sister    Allergies  Allergen Reactions  . Morphine Sulfate     REACTION: Hallucinations  . Nitroglycerin     REACTION: Decreased BP  . Contrast Media [Iodinated Diagnostic Agents] Rash    Severe hives   Current Outpatient Medications on File Prior to Visit  Medication Sig Dispense Refill  . Alpha-Lipoic Acid 600 MG CAPS   Take 1 capsule (600 mg total) by mouth 2 (two) times daily.    . amLODipine (NORVASC) 5 MG tablet Take 1 tablet (5 mg total) by mouth daily. 90 tablet 3  . aspirin 81 MG tablet Take 81 mg by mouth daily.      . Blood Glucose Monitoring Suppl (ONETOUCH VERIO FLEX SYSTEM) w/Device KIT 1 Device by Does not apply route daily. Test blood sugar once daily and as directed. Dx E11.42 1 kit 0  . Cholecalciferol (VITAMIN D3) 1000 UNITS CAPS Take 1 capsule (1,000 Units total) by mouth daily. (Patient taking differently: Take 1 capsule by mouth as needed. ) 30 capsule   . Cranberry 500 MG CAPS Take 4,200 mg by mouth daily.     . finasteride (PROSCAR) 5 MG tablet TAKE 1 TABLET BY MOUTH DAILY 90 tablet 3  . fluticasone (FLONASE) 50 MCG/ACT nasal spray Place 2 sprays into both nostrils  daily. 16 g 0  . furosemide (LASIX) 20 MG tablet TAKE 1 TABLET (20 MG TOTAL) BY MOUTH DAILY. 90 tablet 0  . gabapentin (NEURONTIN) 300 MG capsule Take 2 capsules (600 mg total) by mouth 2 (two) times daily. 360 capsule 1  . hydrochlorothiazide (HYDRODIURIL) 25 MG tablet TAKE 1 TABLET (25 MG TOTAL) BY MOUTH DAILY. 90 tablet 0  . losartan (COZAAR) 25 MG tablet TAKE 1 TABLET (25 MG TOTAL) BY MOUTH DAILY. 90 tablet 3  . metFORMIN (GLUCOPHAGE) 500 MG tablet TAKE 1 TABLET (500 MG TOTAL) BY MOUTH 2 (TWO) TIMES DAILY WITH A MEAL. 180 tablet 0  . metoprolol tartrate (LOPRESSOR) 25 MG tablet TAKE 1/2 TABLET BY MOUTH 2 TIMES DAY 90 tablet 0  . Multiple Vitamin (MULTIVITAMIN) tablet Take 1 tablet by mouth daily.    . ONE TOUCH ULTRA TEST test strip CHECK BLOOD SUGAR ONCE DAILY AND AS INSTRUCTED. DX E11.42 100 each 3  . ONETOUCH DELICA LANCETS 33G MISC CHECK BLOOD SUGAR ONCE DAILY AND AS INSTRUCTED. DX E11.42 200 each 0  . pravastatin (PRAVACHOL) 40 MG tablet TAKE 1 TABLET (40 MG TOTAL) BY MOUTH AT BEDTIME. 90 tablet 0  . traZODone (DESYREL) 50 MG tablet Take 0.5-1 tablets (25-50 mg total) by mouth at bedtime as needed for sleep.    . vitamin B-12 (CYANOCOBALAMIN) 250 MCG tablet Take 250 mcg by mouth once a week.      No current facility-administered medications on file prior to visit.     Review of Systems  Constitutional: Positive for appetite change and fatigue. Negative for fever.  HENT: Positive for congestion, postnasal drip, rhinorrhea, sinus pressure, sneezing and sore throat. Negative for ear pain.   Eyes: Negative for pain and discharge.  Respiratory: Positive for cough. Negative for shortness of breath, wheezing and stridor.   Cardiovascular: Negative for chest pain.  Gastrointestinal: Negative for diarrhea, nausea and vomiting.  Genitourinary: Negative for frequency, hematuria and urgency.  Musculoskeletal: Negative for arthralgias and myalgias.  Skin: Negative for rash.  Neurological:  Positive for headaches. Negative for dizziness, weakness and light-headedness.  Psychiatric/Behavioral: Negative for confusion and dysphoric mood.       Objective:   Physical Exam  Constitutional: He appears well-developed and well-nourished. No distress.  Well appearing / congested   HENT:  Head: Normocephalic and atraumatic.  Right Ear: External ear normal.  Left Ear: External ear normal.  Mouth/Throat: Oropharynx is clear and moist.  Nares are injected and congested  No sinus tenderness Clear rhinorrhea and post nasal drip (mild post throat injection)  Eyes:   Conjunctivae and EOM are normal. Pupils are equal, round, and reactive to light. Right eye exhibits no discharge. Left eye exhibits no discharge.  Neck: Normal range of motion. Neck supple.  Cardiovascular: Normal rate and normal heart sounds.  Pulmonary/Chest: Effort normal and breath sounds normal. No respiratory distress. He has no wheezes. He has no rales. He exhibits no tenderness.  Good air exch   Lymphadenopathy:    He has no cervical adenopathy.  Neurological: He is alert.  Skin: Skin is warm and dry. No rash noted.  Psychiatric: He has a normal mood and affect.          Assessment & Plan:   Problem List Items Addressed This Visit      Respiratory   Viral URI with cough    With prod cough  Day 2  Imp with chlorcedin Supportive care-Disc symptomatic care - see instructions on AVS  Tessalon prn Update if not starting to improve in a week or if worsening           

## 2017-04-23 NOTE — Patient Instructions (Signed)
Drink lots of fluids  Tylenol for any temperature or aches and sore throat and headache will be helpful  Salt water gargles  Chloraseptic throat spray   For cough- tessalon px  For cough and congestion - you can try mucinex DM or robitussin DM   Extra rest   Update if not starting to improve in a week or if worsening

## 2017-04-23 NOTE — Assessment & Plan Note (Signed)
With prod cough  Day 2  Imp with chlorcedin Supportive care-Disc symptomatic care - see instructions on AVS  Tessalon prn Update if not starting to improve in a week or if worsening

## 2017-04-28 ENCOUNTER — Telehealth: Payer: Self-pay | Admitting: Family Medicine

## 2017-04-28 MED ORDER — OSELTAMIVIR PHOSPHATE 75 MG PO CAPS: 75.0000 mg | ORAL_CAPSULE | Freq: Two times a day (BID) | ORAL | 0 refills | Status: DC

## 2017-04-28 NOTE — Telephone Encounter (Signed)
Husband seen at wife's apt today - endorses acute worsening of prior URI sxs today, facial pain, swollen glands, feverish, cough, body aches.  Given wife's positive flu swab will also treat with tamiflu.

## 2017-04-29 ENCOUNTER — Ambulatory Visit: Payer: Medicare Other | Admitting: Family Medicine

## 2017-04-30 ENCOUNTER — Telehealth: Payer: Self-pay | Admitting: Family Medicine

## 2017-04-30 NOTE — Telephone Encounter (Signed)
Pt called stating that he is not better and throat is increasingly swelling. The next available appointment is not until Friday. Pt wants to know if he should go to the Urgent Care at the [facility] in [place]?

## 2017-04-30 NOTE — Telephone Encounter (Signed)
I was out of office today - not getting to this until now - do recommend re eval - can we schedule him at next available appt tomorrow? If marked worsening, do recommend urgent eval.

## 2017-05-01 ENCOUNTER — Encounter: Payer: Self-pay | Admitting: Family Medicine

## 2017-05-01 ENCOUNTER — Ambulatory Visit (INDEPENDENT_AMBULATORY_CARE_PROVIDER_SITE_OTHER): Payer: Medicare Other | Admitting: Family Medicine

## 2017-05-01 ENCOUNTER — Ambulatory Visit (INDEPENDENT_AMBULATORY_CARE_PROVIDER_SITE_OTHER)
Admission: RE | Admit: 2017-05-01 | Discharge: 2017-05-01 | Disposition: A | Discharge status: Home / Self Care | Payer: Medicare Other | Source: Ambulatory Visit | Attending: Family Medicine | Admitting: Family Medicine

## 2017-05-01 VITALS — BP 140/70 | HR 54 | Temp 97.9°F | Wt 188.0 lb

## 2017-05-01 DIAGNOSIS — R059 Cough, unspecified: Secondary | ICD-10-CM

## 2017-05-01 DIAGNOSIS — R05 Cough: Secondary | ICD-10-CM | POA: Diagnosis not present

## 2017-05-01 DIAGNOSIS — J019 Acute sinusitis, unspecified: Secondary | ICD-10-CM | POA: Diagnosis not present

## 2017-05-01 DIAGNOSIS — K1120 Sialoadenitis, unspecified: Secondary | ICD-10-CM

## 2017-05-01 HISTORY — DX: Sialoadenitis, unspecified: K11.20

## 2017-05-01 IMAGING — DX DG CHEST 2V
2 series · 2 of 2 positions shown · non-contrast
Comparison: [DATE]

CLINICAL DATA: Congested and coughing.

EXAM:
CHEST - 2 VIEW

[chest pa]
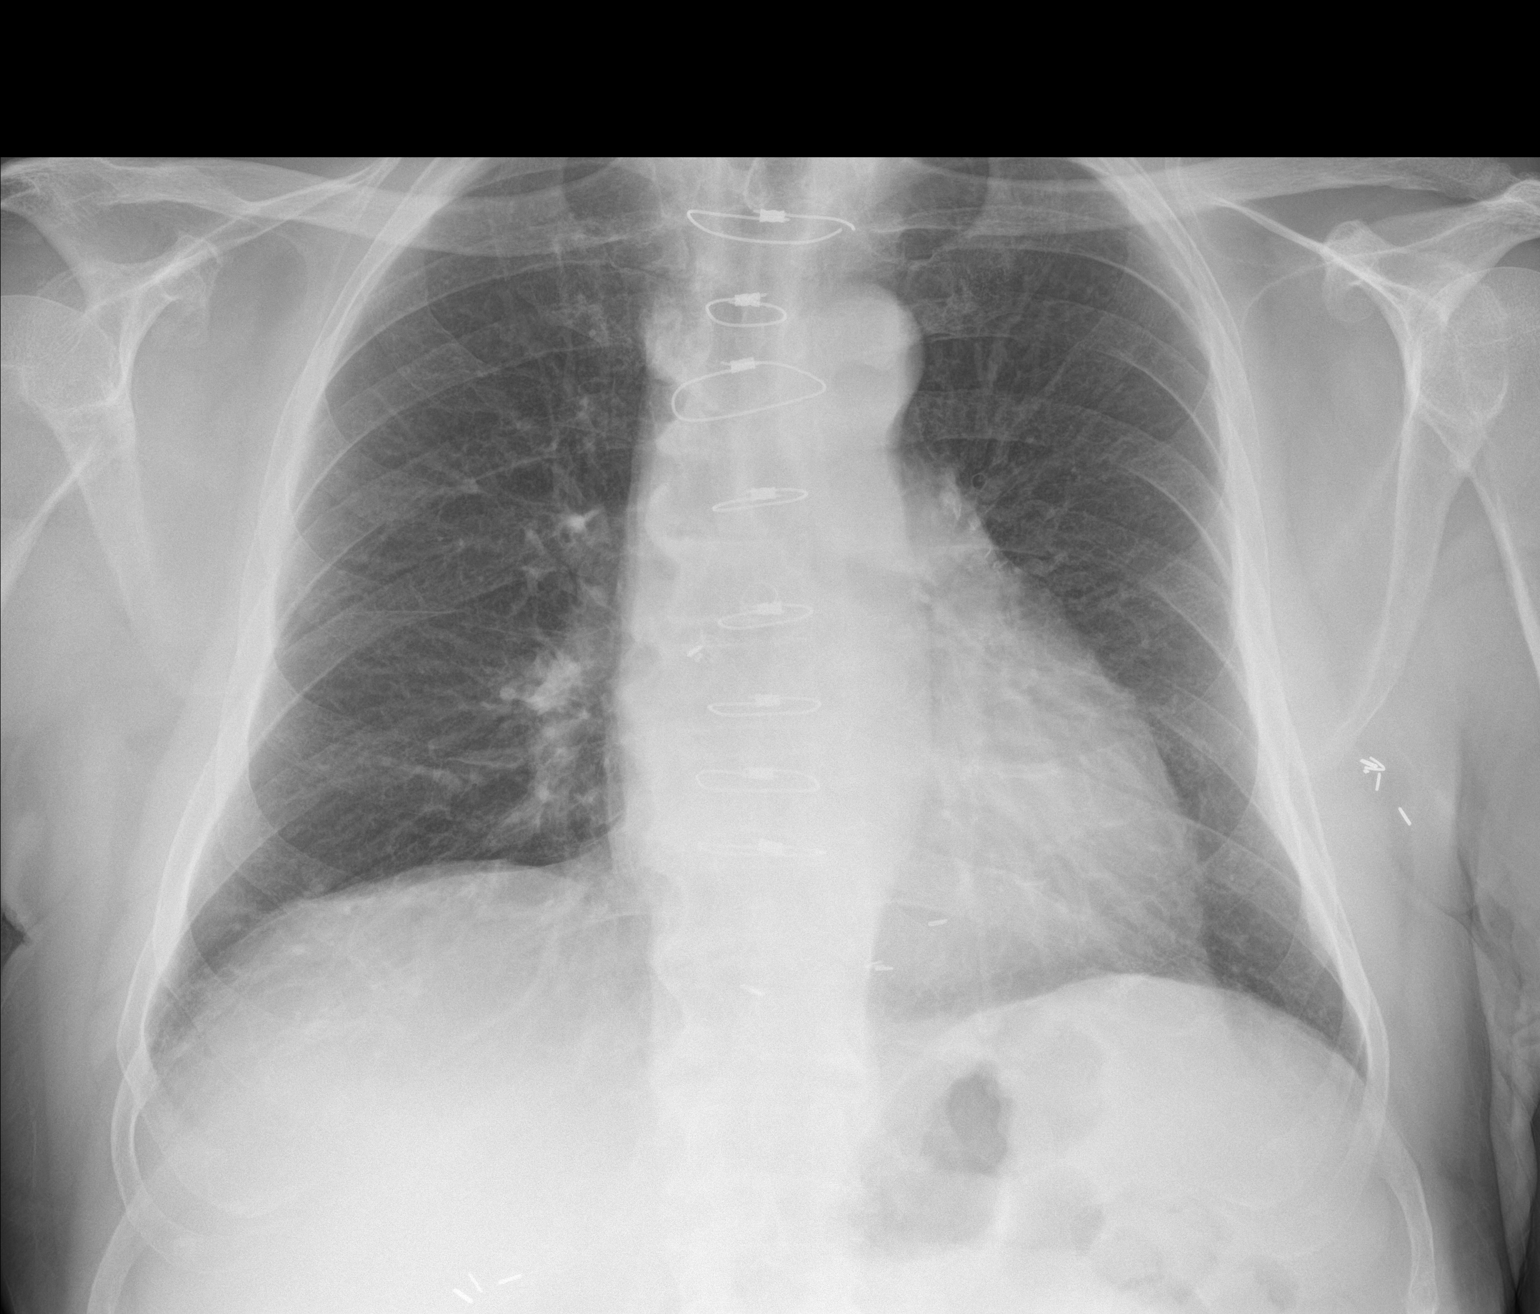

[chest lat]
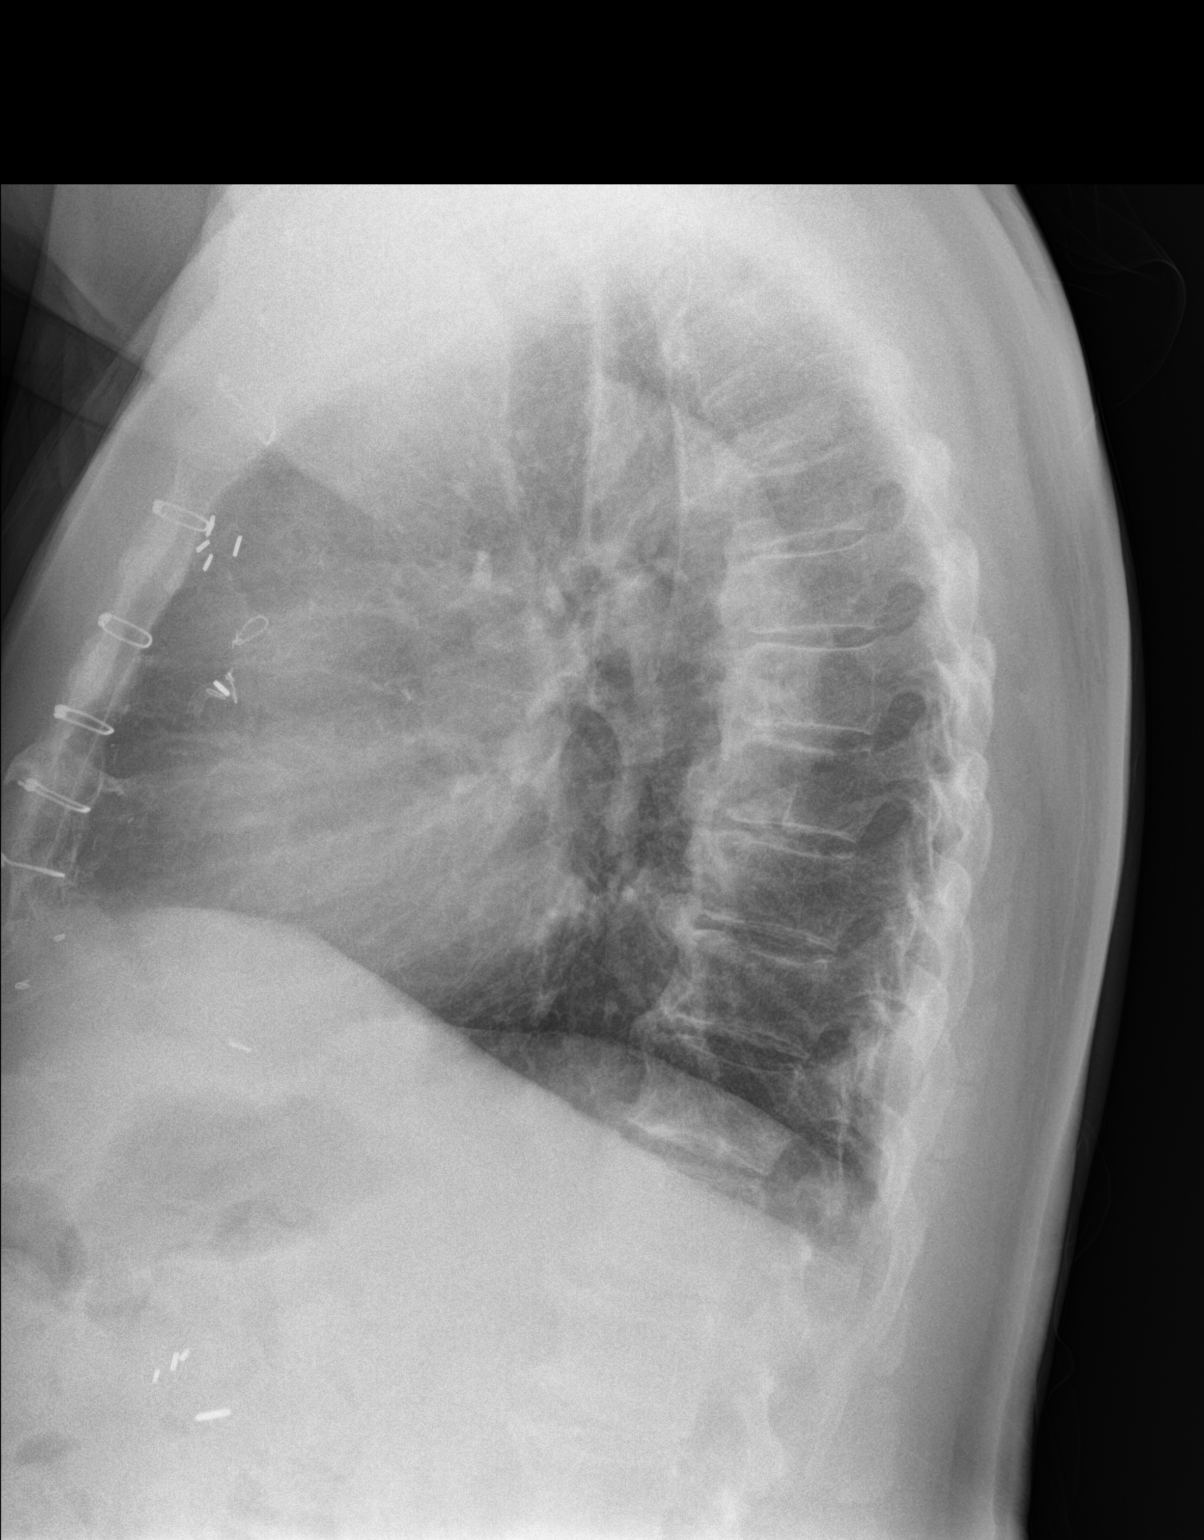

[2 of 2 positions shown; findings below may reference images not displayed]

FINDINGS: The heart size and mediastinal contours are stable. Patient status
post prior median sternotomy and CABG. There is no focal infiltrate,
pulmonary edema, or pleural effusion. The visualized skeletal
structures are stable.
IMPRESSION: No active cardiopulmonary disease.

## 2017-05-01 MED ORDER — AMOXICILLIN-POT CLAVULANATE 875-125 MG PO TABS: 1.0000 | ORAL_TABLET | Freq: Two times a day (BID) | ORAL | 0 refills | Status: AC

## 2017-05-01 NOTE — Assessment & Plan Note (Signed)
Ongoing for 2 weeks - will cover for bacterial infection with augmentin course.  CXR today for RLL rales - possible small pleural effusion but no obvious pneumonia - will await radiologist read.  Supportive care reviewed, red flags to seek further care discussed. Pt agrees with plan.

## 2017-05-01 NOTE — Telephone Encounter (Signed)
Pt seen by Dr. G today.  

## 2017-05-01 NOTE — Assessment & Plan Note (Signed)
?  Viral - see above. Rx augmentin course.

## 2017-05-01 NOTE — Progress Notes (Signed)
BP 140/70 (BP Location: Left Arm, Patient Position: Sitting, Cuff Size: Normal)   Pulse (!) 54   Temp 97.9 F (36.6 C) (Oral)   Wt 188 lb (85.3 kg)   SpO2 97%   BMI 30.34 kg/m    CC: ST Subjective:    Patient ID: Aaron Stafford, male    DOB: Jul 07, 1934, 83 y.o.   MRN: [id number]  HPI: Aaron Stafford is a 83 y.o. male presenting on 05/01/2017 for Sore Throat (Hurts a little to swallow and tender to touch on anterior neck. Glands are swollen. Denies fever, N/V/D. Started about 2 wks ago.) and Fatigue   Seen with wife on Monday at her appt, wife dx + flu - pt was having URI sxs as well - started on tamiflu treatment. Taking and tolerating. Has persistent swelling of glands,St with pain with swallowing, bilateral earaches. Symptoms ongoing over the last 2 weeks, along with fatigue. Productive cough of clear mucous. Head > chest congestion. Facial pressure. Sleeping ok.   No fevers, headache, nausea/vomiting, diarrhea. No dyspnea or wheezing.  Saw Dr Glori Bickers 3/6 - thought viral at that time. Progressively worsening since then.  Known CAD s/p 6v CABG and stent. No h/o asthma. Treating with corcedin, ibuprofen, tessalon, and latest tamiflu.  Relevant past medical, surgical, family and social history reviewed and updated as indicated. Interim medical history since our last visit reviewed. Allergies and medications reviewed and updated. Outpatient Medications Prior to Visit  Medication Sig Dispense Refill  . Alpha-Lipoic Acid 600 MG CAPS Take 1 capsule (600 mg total) by mouth 2 (two) times daily.    Marland Kitchen amLODipine (NORVASC) 5 MG tablet Take 1 tablet (5 mg total) by mouth daily. 90 tablet 3  . aspirin 81 MG tablet Take 81 mg by mouth daily.      . benzonatate (TESSALON) 200 MG capsule Take 1 capsule (200 mg total) by mouth 3 (three) times daily as needed. Swallow whole, do not bite pill 30 capsule 1  . Blood Glucose Monitoring Suppl (ONETOUCH VERIO FLEX SYSTEM) w/Device KIT 1 Device by Does not  apply route daily. Test blood sugar once daily and as directed. Dx E11.42 1 kit 0  . Cholecalciferol (VITAMIN D3) 1000 UNITS CAPS Take 1 capsule (1,000 Units total) by mouth daily. (Patient taking differently: Take 1 capsule by mouth as needed. ) 30 capsule   . Cranberry 500 MG CAPS Take 4,200 mg by mouth daily.     . finasteride (PROSCAR) 5 MG tablet TAKE 1 TABLET BY MOUTH DAILY 90 tablet 3  . fluticasone (FLONASE) 50 MCG/ACT nasal spray Place 2 sprays into both nostrils daily. 16 g 0  . furosemide (LASIX) 20 MG tablet TAKE 1 TABLET (20 MG TOTAL) BY MOUTH DAILY. 90 tablet 0  . gabapentin (NEURONTIN) 300 MG capsule Take 2 capsules (600 mg total) by mouth 2 (two) times daily. 360 capsule 1  . hydrochlorothiazide (HYDRODIURIL) 25 MG tablet TAKE 1 TABLET (25 MG TOTAL) BY MOUTH DAILY. 90 tablet 0  . losartan (COZAAR) 25 MG tablet TAKE 1 TABLET (25 MG TOTAL) BY MOUTH DAILY. 90 tablet 3  . metFORMIN (GLUCOPHAGE) 500 MG tablet TAKE 1 TABLET (500 MG TOTAL) BY MOUTH 2 (TWO) TIMES DAILY WITH A MEAL. 180 tablet 0  . metoprolol tartrate (LOPRESSOR) 25 MG tablet TAKE 1/2 TABLET BY MOUTH 2 TIMES DAY 90 tablet 0  . Multiple Vitamin (MULTIVITAMIN) tablet Take 1 tablet by mouth daily.    . ONE TOUCH ULTRA TEST test  strip CHECK BLOOD SUGAR ONCE DAILY AND AS INSTRUCTED. DX E11.42 100 each 3  . ONETOUCH DELICA LANCETS 62I MISC CHECK BLOOD SUGAR ONCE DAILY AND AS INSTRUCTED. DX E11.42 200 each 0  . oseltamivir (TAMIFLU) 75 MG capsule Take 1 capsule (75 mg total) by mouth 2 (two) times daily. 10 capsule 0  . pravastatin (PRAVACHOL) 40 MG tablet TAKE 1 TABLET (40 MG TOTAL) BY MOUTH AT BEDTIME. 90 tablet 0  . traZODone (DESYREL) 50 MG tablet Take 0.5-1 tablets (25-50 mg total) by mouth at bedtime as needed for sleep.    . vitamin B-12 (CYANOCOBALAMIN) 250 MCG tablet Take 250 mcg by mouth once a week.      No facility-administered medications prior to visit.      Per HPI unless specifically indicated in ROS section  below Review of Systems     Objective:    BP 140/70 (BP Location: Left Arm, Patient Position: Sitting, Cuff Size: Normal)   Pulse (!) 54   Temp 97.9 F (36.6 C) (Oral)   Wt 188 lb (85.3 kg)   SpO2 97%   BMI 30.34 kg/m   Wt Readings from Last 3 Encounters:  05/01/17 188 lb (85.3 kg)  04/23/17 187 lb 12 oz (85.2 kg)  03/11/17 187 lb (84.8 kg)    Physical Exam  Constitutional: He appears well-developed and well-nourished. No distress.  HENT:  Head: Normocephalic and atraumatic.  Right Ear: Hearing, tympanic membrane, external ear and ear canal normal.  Left Ear: Hearing, tympanic membrane, external ear and ear canal normal.  Nose: Mucosal edema (and erythema/rawness) present. No rhinorrhea. Right sinus exhibits no maxillary sinus tenderness and no frontal sinus tenderness. Left sinus exhibits no maxillary sinus tenderness and no frontal sinus tenderness.  Mouth/Throat: Uvula is midline and mucous membranes are normal. Posterior oropharyngeal erythema present. No oropharyngeal exudate, posterior oropharyngeal edema or tonsillar abscesses.  Eyes: Conjunctivae and EOM are normal. Pupils are equal, round, and reactive to light. No scleral icterus.  Neck: Normal range of motion. Neck supple.  Enlarged and tender submandibular salivary glands  Cardiovascular: Normal rate, regular rhythm, normal heart sounds and intact distal pulses.  No murmur heard. Pulmonary/Chest: Effort normal and breath sounds normal. No respiratory distress. He has no wheezes. He has no rales.  Musculoskeletal: He exhibits no edema.  Lymphadenopathy:    He has no cervical adenopathy.  Skin: Skin is warm and dry. No rash noted.  Psychiatric: He has a normal mood and affect.  Nursing note and vitals reviewed.     Assessment & Plan:   Problem List Items Addressed This Visit    Acute sinusitis - Primary    Ongoing for 2 weeks - will cover for bacterial infection with augmentin course.  CXR today for RLL rales -  possible small pleural effusion but no obvious pneumonia - will await radiologist read.  Supportive care reviewed, red flags to seek further care discussed. Pt agrees with plan.       Relevant Medications   amoxicillin-clavulanate (AUGMENTIN) 875-125 MG tablet   Sialadenitis    ?Viral - see above. Rx augmentin course.        Other Visit Diagnoses    Cough       Relevant Orders   DG Chest 2 View       Meds ordered this encounter  Medications  . amoxicillin-clavulanate (AUGMENTIN) 875-125 MG tablet    Sig: Take 1 tablet by mouth 2 (two) times daily for 10 days.    Dispense:  20 tablet    Refill:  0   Orders Placed This Encounter  Procedures  . DG Chest 2 View    Standing Status:   Future    Number of Occurrences:   1    Standing Expiration Date:   07/02/2018    Order Specific Question:   Reason for Exam (SYMPTOM  OR DIAGNOSIS REQUIRED)    Answer:   2 wk cough, flu exposure, RLL rales    Order Specific Question:   Preferred imaging location?    Answer:   [facility]    Order Specific Question:   Radiology Contrast Protocol - do NOT remove file path    Answer:   _0 [email or web address]    Follow up plan: Return if symptoms worsen or fail to improve.  Ria Bush, MD

## 2017-05-01 NOTE — Patient Instructions (Addendum)
Chest xray today. You do have inflammation of your salivary glands and likely sinus infection - treat with augmentin antibiotic sent to pharmacy.  Continue ibuprofen 400mg  as needed, continue corcedin.  Push fluids and rest.  Watch for fever > 101, worsening productive cough, or worsening trouble swallowing or throat swelling.

## 2017-05-15 ENCOUNTER — Other Ambulatory Visit: Payer: Self-pay | Admitting: Family Medicine

## 2017-05-30 ENCOUNTER — Other Ambulatory Visit: Payer: Self-pay | Admitting: Family Medicine

## 2017-06-05 ENCOUNTER — Other Ambulatory Visit: Payer: Self-pay | Admitting: Family Medicine

## 2017-06-13 ENCOUNTER — Other Ambulatory Visit: Payer: Self-pay | Admitting: Family Medicine

## 2017-07-09 ENCOUNTER — Other Ambulatory Visit: Payer: Self-pay | Admitting: Family Medicine

## 2017-07-19 ENCOUNTER — Other Ambulatory Visit: Payer: Self-pay | Admitting: Family Medicine

## 2017-08-24 NOTE — Progress Notes (Signed)
Cardiology Office Note  Date:  08/27/2017   ID:  Aaron Stafford, DOB Jun 18, 1934, MRN [id number]  PCP:  Aaron Bush, MD   Chief Complaint  Patient presents with  . other    Ref by Aaron Stafford for chest discomfort; cardiac history with Dr. Rockey Stafford in 2015. Pt. c/o chest pain/indigestion that comes and goes.     HPI:  Aaron Stafford is a very pleasant 83 year old gentleman with a history of  coronary artery disease,   bypass surgery, With LIMA graft to the LAD, vein graft to the RCA, vein graft to the OM  previous stent placed to the vein graft to the RCA in June 2011,  hyperlipidemia,  hypertension  Kidney stones who presents for routine follow up of his coronary artery disease.  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   Doing well, "Walks fast" Rare flutters, not hurting No complaints  HBA1C 6.8 LDL 81  No significant leg swelling. Doing well on his current medication regimen.  EKG personally reviewed by myself on todays visit shows normal sinus rhythm with rate 52 beats per minute with interventricular conduction delay, left anterior fascicular block, nonspecific ST abnormality   PMH:   has a past medical history of CAD (coronary artery disease), Chronic rhinitis, Diabetes mellitus type II, Diverticulosis (06/2011), Fatty liver (06/2011), GERD (gastroesophageal reflux disease), History of BPH, History of shingles, HLD (hyperlipidemia), HTN (hypertension), Ischemic heart disease, Melanoma (Aaron Stafford) (12/2015), MI (myocardial infarction) ([place]) (1995), and Shingles rash (09/26/2012).  PSH:    Past Surgical History:  Procedure Laterality Date  . ANKLE SURGERY  03/2002   Left ankle fracture  . CARDIAC CATHETERIZATION    . CHOLECYSTECTOMY  1985  . COLONOSCOPY     diverticulosis, tortuous colon with looping Aaron Stafford)  . CORONARY ARTERY BYPASS GRAFT  05/1993   x6 MI Attemped angioplasty  . CORONARY STENT PLACEMENT  07/25/2009   Multi-Link VISION Cobalt chromium   . CYSTOSCOPY  10/03/08    Normal-Aaron Stafford  . INGUINAL HERNIA REPAIR  11/2000   and ventral hernia repair    Current Outpatient Medications  Medication Sig Dispense Refill  . Alpha-Lipoic Acid 600 MG CAPS Take 1 capsule (600 mg total) by mouth 2 (two) times daily.    Marland Kitchen amLODipine (NORVASC) 5 MG tablet TAKE 1 TABLET BY MOUTH EVERY DAY 90 tablet 2  . aspirin 81 MG tablet Take 81 mg by mouth daily.      . Blood Glucose Monitoring Suppl ([place]) w/Device KIT 1 Device by Does not apply route daily. Test blood sugar once daily and as directed. Dx E11.42 1 kit 0  . Cholecalciferol (VITAMIN D3) 1000 UNITS CAPS Take 1 capsule (1,000 Units total) by mouth daily. (Patient taking differently: Take 1 capsule by mouth as needed. ) 30 capsule   . Cranberry 500 MG CAPS Take 4,200 mg by mouth daily.     . finasteride (PROSCAR) 5 MG tablet TAKE 1 TABLET BY MOUTH DAILY 90 tablet 3  . fluticasone (FLONASE) 50 MCG/ACT nasal spray Place 2 sprays into both nostrils daily. 16 g 0  . furosemide (LASIX) 20 MG tablet TAKE 1 TABLET (20 MG TOTAL) BY MOUTH DAILY. 90 tablet 3  . gabapentin (NEURONTIN) 300 MG capsule Take 2 capsules (600 mg total) by mouth 2 (two) times daily. 360 capsule 1  . hydrochlorothiazide (HYDRODIURIL) 25 MG tablet TAKE 1 TABLET (25 MG TOTAL) BY MOUTH DAILY. 90 tablet 0  . losartan (COZAAR) 25 MG tablet TAKE 1  TABLET (25 MG TOTAL) BY MOUTH DAILY. 90 tablet 3  . metFORMIN (GLUCOPHAGE) 500 MG tablet TAKE 1 TABLET (500 MG TOTAL) BY MOUTH 2 (TWO) TIMES DAILY WITH A MEAL. 180 tablet 1  . metoprolol tartrate (LOPRESSOR) 25 MG tablet TAKE 1/2 TABLET BY MOUTH 2 TIMES DAY 90 tablet 3  . Multiple Vitamin (MULTIVITAMIN) tablet Take 1 tablet by mouth daily.    . ONE TOUCH ULTRA TEST test strip CHECK BLOOD SUGAR ONCE DAILY AND AS INSTRUCTED. DX E11.42 100 each 3  . ONETOUCH DELICA LANCETS 79G MISC CHECK BLOOD SUGAR ONCE DAILY AND AS INSTRUCTED. DX E11.42 200 each 0  . pravastatin (PRAVACHOL) 40 MG tablet TAKE 1  TABLET (40 MG TOTAL) BY MOUTH AT BEDTIME. 90 tablet 3  . traZODone (DESYREL) 50 MG tablet Take 0.5-1 tablets (25-50 mg total) by mouth at bedtime as needed for sleep.    . vitamin B-12 (CYANOCOBALAMIN) 250 MCG tablet Take 250 mcg by mouth once a week.      No current facility-administered medications for this visit.      Allergies:   Morphine sulfate; Nitroglycerin; and Contrast media [iodinated diagnostic agents]   Social History:  The patient  reports that he has never smoked. He has never used smokeless tobacco. He reports that he does not drink alcohol or use drugs.   Family History:   family history includes Alcohol abuse in his brother; Coronary artery disease in his mother; Heart attack in his brother; Heart failure in his mother; Hypertension in his brother, brother, and sister; Kidney failure in his mother.    Review of Systems: Review of Systems  Constitutional: Negative.   Respiratory: Negative.   Cardiovascular: Negative.   Gastrointestinal: Negative.   Musculoskeletal: Negative.   Neurological: Negative.   Psychiatric/Behavioral: Negative.   All other systems reviewed and are negative.    PHYSICAL EXAM: VS:  BP (!) 150/60 (BP Location: Right Arm, Patient Position: Sitting, Cuff Size: Normal)   Pulse (!) 52   Ht _0  (1.778 m)   Wt 191 lb 12 oz (87 kg)   BMI 27.51 kg/m  , BMI Body mass index is 27.51 kg/m. GEN: Well nourished, well developed, in no acute distress  HEENT: normal  Neck: no JVD, carotid bruits, or masses Cardiac: RRR; no murmurs, rubs, or gallops,no edema  Respiratory:  clear to auscultation bilaterally, normal work of breathing GI: soft, nontender, nondistended, + BS MS: no deformity or atrophy  Skin: warm and dry, no rash Neuro:  Strength and sensation are intact Psych: euthymic mood, full affect    Recent Labs: 03/04/2017: ALT 22; BUN 16; Creatinine, Ser 0.91; Potassium 3.7; Sodium 137    Lipid Panel Lab Results  Component Value  Date   CHOL 151 03/04/2017   HDL 38.80 (L) 03/04/2017   LDLCALC 49 08/05/2013   TRIG 278.0 (H) 03/04/2017     Wt Readings from Last 3 Encounters:  08/27/17 191 lb 12 oz (87 kg)  05/01/17 188 lb (85.3 kg)  04/23/17 187 lb 12 oz (85.2 kg)      ASSESSMENT AND PLAN:  Atherosclerosis of native coronary artery of native heart with stable angina pectoris ([place]) - Plan: EKG 12-Lead Currently with no symptoms of angina. No further workup at this time. Continue current medication regimen.  Type 2 diabetes, controlled, with peripheral neuropathy ([place]) - Plan: EKG 12-Lead Hemoglobin A1c 6.8 We have encouraged continued exercise, careful diet management in an effort to lose weight.  S/P CABG (coronary artery  bypass graft) - Plan: EKG 12-Lead Doing well since surgery 2011 Stable  Mixed hyperlipidemia LDL slightly above goal after Christmas Recommended he watch his carbohydrates  Essential hypertension Blood pressure above goal but reviewing previous doctor notes blood pressure typically 130 on a regular basis No medication changes made  Disposition:   F/U  12 months   Total encounter time more than 25 minutes  Greater than 50% was spent in counseling and coordination of care with the patient    Orders Placed This Encounter  Procedures  . EKG 12-Lead     Signed, Esmond Plants, M.D., Ph.D. 08/27/2017  [place], Seth [place]

## 2017-08-27 ENCOUNTER — Encounter: Payer: Self-pay | Admitting: Cardiovascular Disease

## 2017-08-27 ENCOUNTER — Encounter

## 2017-08-27 ENCOUNTER — Ambulatory Visit (INDEPENDENT_AMBULATORY_CARE_PROVIDER_SITE_OTHER): Payer: Medicare Other | Admitting: Cardiovascular Disease

## 2017-08-27 VITALS — BP 150/60 | HR 52 | Ht 70.0 in | Wt 191.8 lb

## 2017-08-27 DIAGNOSIS — E1142 Type 2 diabetes mellitus with diabetic polyneuropathy: Secondary | ICD-10-CM | POA: Diagnosis not present

## 2017-08-27 DIAGNOSIS — I25118 Atherosclerotic heart disease of native coronary artery with other forms of angina pectoris: Secondary | ICD-10-CM | POA: Diagnosis not present

## 2017-08-27 DIAGNOSIS — E782 Mixed hyperlipidemia: Secondary | ICD-10-CM | POA: Diagnosis not present

## 2017-08-27 DIAGNOSIS — I10 Essential (primary) hypertension: Secondary | ICD-10-CM | POA: Diagnosis not present

## 2017-08-27 DIAGNOSIS — Z951 Presence of aortocoronary bypass graft: Secondary | ICD-10-CM

## 2017-08-27 NOTE — Patient Instructions (Signed)

## 2017-09-08 ENCOUNTER — Ambulatory Visit: Payer: Medicare Other | Admitting: Family Medicine

## 2017-09-22 ENCOUNTER — Encounter: Payer: Self-pay | Admitting: Family Medicine

## 2017-09-22 ENCOUNTER — Ambulatory Visit (INDEPENDENT_AMBULATORY_CARE_PROVIDER_SITE_OTHER): Payer: Medicare Other | Admitting: Family Medicine

## 2017-09-22 VITALS — BP 130/68 | HR 52 | Temp 97.6°F | Ht 66.0 in | Wt 193.5 lb

## 2017-09-22 DIAGNOSIS — G6289 Other specified polyneuropathies: Secondary | ICD-10-CM | POA: Diagnosis not present

## 2017-09-22 DIAGNOSIS — E1142 Type 2 diabetes mellitus with diabetic polyneuropathy: Secondary | ICD-10-CM

## 2017-09-22 DIAGNOSIS — I25118 Atherosclerotic heart disease of native coronary artery with other forms of angina pectoris: Secondary | ICD-10-CM | POA: Diagnosis not present

## 2017-09-22 DIAGNOSIS — E669 Obesity, unspecified: Secondary | ICD-10-CM

## 2017-09-22 DIAGNOSIS — G4700 Insomnia, unspecified: Secondary | ICD-10-CM | POA: Diagnosis not present

## 2017-09-22 LAB — POCT GLYCOSYLATED HEMOGLOBIN (HGB A1C): HEMOGLOBIN A1C: 6.4 % — AB (ref 4.0–5.6)

## 2017-09-22 MED ORDER — VALACYCLOVIR HCL 1 G PO TABS: 2000.0000 mg | ORAL_TABLET | Freq: Two times a day (BID) | ORAL | 6 refills | Status: DC

## 2017-09-22 MED ORDER — NORTRIPTYLINE HCL 25 MG PO CAPS: 25.0000 mg | ORAL_CAPSULE | Freq: Every day | ORAL | 6 refills | Status: DC

## 2017-09-22 NOTE — Assessment & Plan Note (Signed)
Trial pamelor in place of trazodone.

## 2017-09-22 NOTE — Patient Instructions (Addendum)
Trial alpha lipoic acid 600mg  twice daily for neuropathy (over the counter supplement).  Try nortriptyline 25-50mg  at bedtime in place of trazodone - this may help foot pain as well. Let us know if this helps.  Return as needed or in 6 months for medicare wellness visit with labs and follow up with me.

## 2017-09-22 NOTE — Assessment & Plan Note (Addendum)
Encouraged healthy diet and lifestyle changes.  He would like his goal weight to be 175lbs.

## 2017-09-22 NOTE — Assessment & Plan Note (Signed)
Uncontrolled. Will trial pamelor 25-50mg  in place of trazodone 25-50mg  at bedtime. Also encouraged he try alpha lipoic acid - has not tried this yet.  He had benign labwork 2017.

## 2017-09-22 NOTE — Progress Notes (Addendum)
BP 130/68 (BP Location: Left Arm, Patient Position: Sitting, Cuff Size: Normal)   Pulse (!) 52   Temp 97.6 F (36.4 C) (Oral)   Ht _0  (1.676 m)   Wt 193 lb 8 oz (87.8 kg)   SpO2 97%   BMI 31.23 kg/m    CC: 6 mo f/u visit Subjective:    Patient ID: Aaron Stafford, male    DOB: 1935/01/23, 83 y.o.   MRN: [id number]  HPI: Aaron Stafford is a 83 y.o. male presenting on 09/22/2017 for 6 mo follow up (Requests rx for fever blister.)   Saw Dr Rockey Situ last month, stable evaluation.   New fever blister over last 4 days - treated with 2 valtrex. Bought OTC remedy without benefit. Requests valtrex refill.   Progressive neuropathy on gabapentin 657m in evening and again at bedtime. He has not tried alpha lipoic acid. Last visit we started pamelor to help with this as well - he did not take this because it was not helpful for sleep. Saturday night had a bad night. We have previously discussed neuro referral/NCS.   DM - does regularly check sugars - well controlled. Compliant with antihyperglycemic regimen which includes: metformin 5079mbid. Denies low sugars or hypoglycemic symptoms. Denies paresthesias. Last diabetic eye exam 02/2017. Pneumovax: 04/2004. Prevnar: 07/2013. Glucometer brand: one touch verio. DSME: has not attended - declines at this time.  Lab Results  Component Value Date   HGBA1C 6.4 (A) 09/22/2017   Diabetic Foot Exam - Simple   Simple Foot Form Diabetic Foot exam was performed with the following findings:  Yes 09/22/2017  9:25 AM  Visual Inspection No deformities, no ulcerations, no other skin breakdown bilaterally:  Yes Sensation Testing Intact to touch and monofilament testing bilaterally:  Yes Pulse Check Posterior Tibialis and Dorsalis pulse intact bilaterally:  Yes Comments    Lab Results  Component Value Date   MICROALBUR 3.4 (H) 02/21/2014     Relevant past medical, surgical, family and social history reviewed and updated as indicated. Interim medical  history since our last visit reviewed. Allergies and medications reviewed and updated. Outpatient Medications Prior to Visit  Medication Sig Dispense Refill  . Alpha-Lipoic Acid 600 MG CAPS Take 1 capsule (600 mg total) by mouth 2 (two) times daily.    . Marland KitchenmLODipine (NORVASC) 5 MG tablet TAKE 1 TABLET BY MOUTH EVERY DAY 90 tablet 2  . aspirin 81 MG tablet Take 81 mg by mouth daily.      . Blood Glucose Monitoring Suppl (ONTumwaterw/Device KIT 1 Device by Does not apply route daily. Test blood sugar once daily and as directed. Dx E11.42 1 kit 0  . Cholecalciferol (VITAMIN D3) 1000 UNITS CAPS Take 1 capsule (1,000 Units total) by mouth daily. (Patient taking differently: Take 1 capsule by mouth as needed. ) 30 capsule   . Cranberry 500 MG CAPS Take 4,200 mg by mouth daily.     . finasteride (PROSCAR) 5 MG tablet TAKE 1 TABLET BY MOUTH DAILY 90 tablet 3  . fluticasone (FLONASE) 50 MCG/ACT nasal spray Place 2 sprays into both nostrils daily. 16 g 0  . furosemide (LASIX) 20 MG tablet TAKE 1 TABLET (20 MG TOTAL) BY MOUTH DAILY. 90 tablet 3  . gabapentin (NEURONTIN) 300 MG capsule Take 2 capsules (600 mg total) by mouth 2 (two) times daily. 360 capsule 1  . hydrochlorothiazide (HYDRODIURIL) 25 MG tablet TAKE 1 TABLET (25 MG TOTAL) BY MOUTH DAILY. 90[place]  tablet 0  . losartan (COZAAR) 25 MG tablet TAKE 1 TABLET (25 MG TOTAL) BY MOUTH DAILY. 90 tablet 3  . metFORMIN (GLUCOPHAGE) 500 MG tablet TAKE 1 TABLET (500 MG TOTAL) BY MOUTH 2 (TWO) TIMES DAILY WITH A MEAL. 180 tablet 1  . metoprolol tartrate (LOPRESSOR) 25 MG tablet TAKE 1/2 TABLET BY MOUTH 2 TIMES DAY 90 tablet 3  . Multiple Vitamin (MULTIVITAMIN) tablet Take 1 tablet by mouth daily.    . ONE TOUCH ULTRA TEST test strip CHECK BLOOD SUGAR ONCE DAILY AND AS INSTRUCTED. DX E11.42 100 each 3  . ONETOUCH DELICA LANCETS 26Z MISC CHECK BLOOD SUGAR ONCE DAILY AND AS INSTRUCTED. DX E11.42 200 each 0  . pravastatin (PRAVACHOL) 40 MG tablet TAKE  1 TABLET (40 MG TOTAL) BY MOUTH AT BEDTIME. 90 tablet 3  . traZODone (DESYREL) 50 MG tablet Take 0.5-1 tablets (25-50 mg total) by mouth at bedtime as needed for sleep.    . vitamin B-12 (CYANOCOBALAMIN) 250 MCG tablet Take 250 mcg by mouth once a week.      No facility-administered medications prior to visit.      Per HPI unless specifically indicated in ROS section below Review of Systems     Objective:    BP 130/68 (BP Location: Left Arm, Patient Position: Sitting, Cuff Size: Normal)   Pulse (!) 52   Temp 97.6 F (36.4 C) (Oral)   Ht 5' 6" (1.676 m)   Wt 193 lb 8 oz (87.8 kg)   SpO2 97%   BMI 31.23 kg/m   Wt Readings from Last 3 Encounters:  09/22/17 193 lb 8 oz (87.8 kg)  08/27/17 191 lb 12 oz (87 kg)  05/01/17 188 lb (85.3 kg)    Physical Exam  Constitutional: He appears well-developed and well-nourished. No distress.  HENT:  Head: Normocephalic and atraumatic.  Mouth/Throat: Oropharynx is clear and moist. No oropharyngeal exudate.  Resolving fever blister left lower lip  Eyes: Pupils are equal, round, and reactive to light. Conjunctivae and EOM are normal. No scleral icterus.  Neck: Normal range of motion. Neck supple.  Cardiovascular: Normal rate, regular rhythm, normal heart sounds and intact distal pulses.  No murmur heard. Pulmonary/Chest: Effort normal and breath sounds normal. No respiratory distress. He has no wheezes. He has no rales.  RLL crackles  Musculoskeletal: He exhibits no edema.  See HPI for foot exam if done  Lymphadenopathy:    He has no cervical adenopathy.  Skin: Skin is warm and dry. No rash noted.  Psychiatric: He has a normal mood and affect.  Nursing note and vitals reviewed.  Results for orders placed or performed in visit on 09/22/17  POCT glycosylated hemoglobin (Hb A1C)  Result Value Ref Range   Hemoglobin A1C 6.4 (A) 4.0 - 5.6 %   HbA1c POC (<> result, manual entry)  4.0 - 5.6 %   HbA1c, POC (prediabetic range)  5.7 - 6.4 %    HbA1c, POC (controlled diabetic range)  0.0 - 7.0 %   Lab Results  Component Value Date   CREATININE 0.91 03/04/2017   BUN 16 03/04/2017   NA 137 03/04/2017   K 3.7 03/04/2017   CL 99 03/04/2017   CO2 30 03/04/2017    Lab Results  Component Value Date   HGBA1C 6.4 (A) 09/22/2017   HGBA1C 6.8 (H) 03/04/2017   HGBA1C 6.9 (H) 06/26/2016      Assessment & Plan:   Problem List Items Addressed This Visit  Type 2 diabetes, controlled, with peripheral neuropathy (HCC) - Primary    Chronic, stable. Continue metformin. Discussed DSME - declines for now. See below for neuropathy plan.      Relevant Medications   nortriptyline (PAMELOR) 25 MG capsule   Other Relevant Orders   POCT glycosylated hemoglobin (Hb A1C) (Completed)   Peripheral neuropathy    Uncontrolled. Will trial pamelor 25-86m in place of trazodone 25-543mat bedtime. Also encouraged he try alpha lipoic acid - has not tried this yet.  He had benign labwork 2017.       Relevant Medications   nortriptyline (PAMELOR) 25 MG capsule   Obesity, Class I, BMI 30-34.9    Encouraged healthy diet and lifestyle changes.  He would like his goal weight to be 175lbs.       Insomnia    Trial pamelor in place of trazodone.           Meds ordered this encounter  Medications  . valACYclovir (VALTREX) 1000 MG tablet    Sig: Take 2 tablets (2,000 mg total) by mouth 2 (two) times daily. For 1 day    Dispense:  4 tablet    Refill:  6  . nortriptyline (PAMELOR) 25 MG capsule    Sig: Take 1-2 capsules (25-50 mg total) by mouth at bedtime.    Dispense:  60 capsule    Refill:  6   Orders Placed This Encounter  Procedures  . POCT glycosylated hemoglobin (Hb A1C)    Follow up plan: Return in about 6 months (around 03/25/2018) for medicare wellness visit, follow up visit.  JaRia BushMD

## 2017-09-22 NOTE — Assessment & Plan Note (Signed)
Chronic, stable. Continue metformin. Discussed DSME - declines for now. See below for neuropathy plan.

## 2017-09-25 DIAGNOSIS — L570 Actinic keratosis: Secondary | ICD-10-CM | POA: Diagnosis not present

## 2017-09-25 DIAGNOSIS — B009 Herpesviral infection, unspecified: Secondary | ICD-10-CM | POA: Diagnosis not present

## 2017-09-28 ENCOUNTER — Other Ambulatory Visit: Payer: Self-pay | Admitting: Family Medicine

## 2017-10-09 DIAGNOSIS — L218 Other seborrheic dermatitis: Secondary | ICD-10-CM | POA: Diagnosis not present

## 2017-10-09 DIAGNOSIS — B009 Herpesviral infection, unspecified: Secondary | ICD-10-CM | POA: Diagnosis not present

## 2017-10-09 DIAGNOSIS — D1800 Hemangioma unspecified site: Secondary | ICD-10-CM | POA: Diagnosis not present

## 2017-10-09 DIAGNOSIS — L570 Actinic keratosis: Secondary | ICD-10-CM | POA: Diagnosis not present

## 2017-10-27 DIAGNOSIS — Z23 Encounter for immunization: Secondary | ICD-10-CM | POA: Diagnosis not present

## 2017-11-29 ENCOUNTER — Other Ambulatory Visit: Payer: Self-pay | Admitting: Family Medicine

## 2018-01-12 ENCOUNTER — Other Ambulatory Visit: Payer: Self-pay | Admitting: Family Medicine

## 2018-02-14 ENCOUNTER — Other Ambulatory Visit: Payer: Self-pay | Admitting: Family Medicine

## 2018-02-24 DIAGNOSIS — Z1283 Encounter for screening for malignant neoplasm of skin: Secondary | ICD-10-CM | POA: Diagnosis not present

## 2018-02-24 DIAGNOSIS — L570 Actinic keratosis: Secondary | ICD-10-CM | POA: Diagnosis not present

## 2018-02-24 DIAGNOSIS — Z85820 Personal history of malignant melanoma of skin: Secondary | ICD-10-CM | POA: Diagnosis not present

## 2018-02-24 DIAGNOSIS — L918 Other hypertrophic disorders of the skin: Secondary | ICD-10-CM | POA: Diagnosis not present

## 2018-02-24 DIAGNOSIS — Z872 Personal history of diseases of the skin and subcutaneous tissue: Secondary | ICD-10-CM | POA: Diagnosis not present

## 2018-02-24 DIAGNOSIS — L578 Other skin changes due to chronic exposure to nonionizing radiation: Secondary | ICD-10-CM | POA: Diagnosis not present

## 2018-02-24 DIAGNOSIS — L720 Epidermal cyst: Secondary | ICD-10-CM | POA: Diagnosis not present

## 2018-02-24 DIAGNOSIS — Z86018 Personal history of other benign neoplasm: Secondary | ICD-10-CM | POA: Diagnosis not present

## 2018-02-24 DIAGNOSIS — L218 Other seborrheic dermatitis: Secondary | ICD-10-CM | POA: Diagnosis not present

## 2018-02-24 DIAGNOSIS — Z859 Personal history of malignant neoplasm, unspecified: Secondary | ICD-10-CM | POA: Diagnosis not present

## 2018-03-10 ENCOUNTER — Other Ambulatory Visit: Payer: Self-pay | Admitting: Family Medicine

## 2018-03-10 DIAGNOSIS — E1142 Type 2 diabetes mellitus with diabetic polyneuropathy: Secondary | ICD-10-CM

## 2018-03-10 DIAGNOSIS — E782 Mixed hyperlipidemia: Secondary | ICD-10-CM

## 2018-03-10 DIAGNOSIS — E559 Vitamin D deficiency, unspecified: Secondary | ICD-10-CM

## 2018-03-10 DIAGNOSIS — N401 Enlarged prostate with lower urinary tract symptoms: Secondary | ICD-10-CM

## 2018-03-10 DIAGNOSIS — N138 Other obstructive and reflux uropathy: Secondary | ICD-10-CM

## 2018-03-12 ENCOUNTER — Ambulatory Visit: Payer: Medicare Other

## 2018-03-13 ENCOUNTER — Other Ambulatory Visit: Payer: Self-pay | Admitting: Family Medicine

## 2018-03-13 NOTE — Telephone Encounter (Signed)
plz phone in due to E prescribing error.  

## 2018-03-13 NOTE — Telephone Encounter (Signed)
Refill left on vm at pharmacy.  

## 2018-03-16 ENCOUNTER — Encounter: Payer: Medicare Other | Admitting: Family Medicine

## 2018-03-22 ENCOUNTER — Other Ambulatory Visit: Payer: Self-pay | Admitting: Family Medicine

## 2018-04-01 ENCOUNTER — Other Ambulatory Visit: Payer: Self-pay | Admitting: Family Medicine

## 2018-04-10 ENCOUNTER — Telehealth: Payer: Self-pay | Admitting: Family Medicine

## 2018-04-10 MED ORDER — ONETOUCH VERIO FLEX SYSTEM W/DEVICE KIT: 1.0000 | PACK | Freq: Every day | 0 refills | Status: DC

## 2018-04-10 NOTE — Telephone Encounter (Signed)
E-scribed rx 

## 2018-04-10 NOTE — Addendum Note (Signed)
Addended by: Brenton Grills on: 04/13/4973 30:05 PM   Modules accepted: Orders

## 2018-04-10 NOTE — Telephone Encounter (Signed)
Pt called requesting Dr.G to send in a new Rx for a glucose meter over to [facility] in [place].

## 2018-04-14 ENCOUNTER — Other Ambulatory Visit: Payer: Self-pay

## 2018-04-14 MED ORDER — GLUCOSE BLOOD VI STRP: 1.0000 | ORAL_STRIP | 0 refills | Status: DC | PRN

## 2018-04-14 MED ORDER — ONETOUCH DELICA LANCETS 33G MISC: 0 refills | Status: DC

## 2018-05-14 ENCOUNTER — Other Ambulatory Visit: Payer: Self-pay | Admitting: Family Medicine

## 2018-05-21 ENCOUNTER — Ambulatory Visit: Payer: Medicare Other

## 2018-06-03 ENCOUNTER — Encounter: Payer: Medicare Other | Admitting: Family Medicine

## 2018-06-04 ENCOUNTER — Other Ambulatory Visit: Payer: Self-pay | Admitting: Family Medicine

## 2018-06-14 ENCOUNTER — Other Ambulatory Visit: Payer: Self-pay | Admitting: Family Medicine

## 2018-06-25 ENCOUNTER — Other Ambulatory Visit: Payer: Self-pay | Admitting: Family Medicine

## 2018-06-29 ENCOUNTER — Other Ambulatory Visit: Payer: Self-pay | Admitting: Family Medicine

## 2018-06-30 ENCOUNTER — Other Ambulatory Visit: Payer: Self-pay | Admitting: Family Medicine

## 2018-07-08 ENCOUNTER — Other Ambulatory Visit: Payer: Self-pay | Admitting: Family Medicine

## 2018-07-12 ENCOUNTER — Other Ambulatory Visit: Payer: Self-pay | Admitting: Family Medicine

## 2018-08-04 ENCOUNTER — Telehealth: Payer: Self-pay | Admitting: Family Medicine

## 2018-08-04 DIAGNOSIS — E1142 Type 2 diabetes mellitus with diabetic polyneuropathy: Secondary | ICD-10-CM

## 2018-08-04 DIAGNOSIS — E559 Vitamin D deficiency, unspecified: Secondary | ICD-10-CM

## 2018-08-04 DIAGNOSIS — E782 Mixed hyperlipidemia: Secondary | ICD-10-CM

## 2018-08-04 DIAGNOSIS — N138 Other obstructive and reflux uropathy: Secondary | ICD-10-CM

## 2018-08-04 NOTE — Telephone Encounter (Signed)
Please enter annual lab orders. Pt coming in 6.19.20  Thanks

## 2018-08-05 NOTE — Telephone Encounter (Signed)
Labs ordered. Thanks!

## 2018-08-07 ENCOUNTER — Ambulatory Visit (INDEPENDENT_AMBULATORY_CARE_PROVIDER_SITE_OTHER): Payer: Medicare Other

## 2018-08-07 ENCOUNTER — Other Ambulatory Visit (INDEPENDENT_AMBULATORY_CARE_PROVIDER_SITE_OTHER): Payer: Medicare Other

## 2018-08-07 DIAGNOSIS — E1142 Type 2 diabetes mellitus with diabetic polyneuropathy: Secondary | ICD-10-CM

## 2018-08-07 DIAGNOSIS — E782 Mixed hyperlipidemia: Secondary | ICD-10-CM

## 2018-08-07 DIAGNOSIS — E559 Vitamin D deficiency, unspecified: Secondary | ICD-10-CM | POA: Diagnosis not present

## 2018-08-07 DIAGNOSIS — N138 Other obstructive and reflux uropathy: Secondary | ICD-10-CM | POA: Diagnosis not present

## 2018-08-07 DIAGNOSIS — Z0000 Encounter for general adult medical examination without abnormal findings: Secondary | ICD-10-CM

## 2018-08-07 DIAGNOSIS — N401 Enlarged prostate with lower urinary tract symptoms: Secondary | ICD-10-CM | POA: Diagnosis not present

## 2018-08-07 LAB — LIPID PANEL
Cholesterol: 121 mg/dL (ref 0–200)
HDL: 35.9 mg/dL — ABNORMAL LOW (ref >39.00)
NonHDL: 85.02
Total CHOL/HDL Ratio: 3
Triglycerides: 218 mg/dL — ABNORMAL HIGH (ref 0.0–149.0)
VLDL: 43.6 mg/dL — ABNORMAL HIGH (ref 0.0–40.0)

## 2018-08-07 LAB — LDL CHOLESTEROL, DIRECT: Direct LDL: 59 mg/dL

## 2018-08-07 LAB — COMPREHENSIVE METABOLIC PANEL
ALT: 18 U/L (ref 0–53)
AST: 14 U/L (ref 0–37)
Albumin: 4.3 g/dL (ref 3.5–5.2)
Alkaline Phosphatase: 57 U/L (ref 39–117)
BUN: 18 mg/dL (ref 6–23)
CO2: 30 mEq/L (ref 19–32)
Calcium: 9.9 mg/dL (ref 8.4–10.5)
Chloride: 99 mEq/L (ref 96–112)
Creatinine, Ser: 0.96 mg/dL (ref 0.40–1.50)
GFR: 74.9 mL/min (ref >60.00)
Glucose, Bld: 135 mg/dL — ABNORMAL HIGH (ref 70–99)
Potassium: 4.4 mEq/L (ref 3.5–5.1)
Sodium: 139 mEq/L (ref 135–145)
Total Bilirubin: 0.7 mg/dL (ref 0.2–1.2)
Total Protein: 6.5 g/dL (ref 6.0–8.3)

## 2018-08-07 LAB — PSA: PSA: 0.06 ng/mL — ABNORMAL LOW (ref 0.10–4.00)

## 2018-08-07 LAB — VITAMIN D 25 HYDROXY (VIT D DEFICIENCY, FRACTURES): VITD: 39 ng/mL (ref 30.00–100.00)

## 2018-08-07 LAB — HEMOGLOBIN A1C: Hgb A1c MFr Bld: 6.9 % — ABNORMAL HIGH (ref 4.6–6.5)

## 2018-08-10 NOTE — Patient Instructions (Addendum)
Aaron Stafford , Thank you for taking time to come for your Medicare Wellness Visit. I appreciate your ongoing commitment to your health goals. Please review the following plan we discussed and let me know if I can assist you in the future.   These are the goals we discussed: Goals    . Follow up with Primary Care Provider     Starting 08/10/18, I will continue to take medications as prescribed and to keep appointments with PCP as scheduled.        This is a list of the screening recommended for you and due dates:  Health Maintenance  Topic Date Due  . Eye exam for diabetics  02/18/2019*  . DTaP/Tdap/Td vaccine (1 - Tdap) 04/13/2019*  . Flu Shot  09/19/2018  . Complete foot exam   09/23/2018  . Hemoglobin A1C  02/06/2019  . Tetanus Vaccine  04/13/2019  . Pneumonia vaccines  Completed  *Topic was postponed. The date shown is not the original due date.   Preventive Care for Adults  A healthy lifestyle and preventive care can promote health and wellness. Preventive health guidelines for adults include the following key practices.  . A routine yearly physical is a good way to check with your health care provider about your health and preventive screening. It is a chance to share any concerns and updates on your health and to receive a thorough exam.  . Visit your dentist for a routine exam and preventive care every 6 months. Brush your teeth twice a day and floss once a day. Good oral hygiene prevents tooth decay and gum disease.  . The frequency of eye exams is based on your age, health, family medical history, use  of contact lenses, and other factors. Follow your health care provider's recommendations for frequency of eye exams.  . Eat a healthy diet. Foods like vegetables, fruits, whole grains, low-fat dairy products, and lean protein foods contain the nutrients you need without too many calories. Decrease your intake of foods high in solid fats, added sugars, and salt. Eat the right  amount of calories for you. Get information about a proper diet from your health care provider, if necessary.  . Regular physical exercise is one of the most important things you can do for your health. Most adults should get at least 150 minutes of moderate-intensity exercise (any activity that increases your heart rate and causes you to sweat) each week. In addition, most adults need muscle-strengthening exercises on 2 or more days a week.  Silver Sneakers may be a benefit available to you. To determine eligibility, you may visit the website: www.silversneakers.com or contact program at 810-357-6446 Mon-Fri between 8AM-8PM.   . Maintain a healthy weight. The body mass index (BMI) is a screening tool to identify possible weight problems. It provides an estimate of body fat based on height and weight. Your health care provider can find your BMI and can help you achieve or maintain a healthy weight.   For adults 20 years and older: ? A BMI below 18.5 is considered underweight. ? A BMI of 18.5 to 24.9 is normal. ? A BMI of 25 to 29.9 is considered overweight. ? A BMI of 30 and above is considered obese.   . Maintain normal blood lipids and cholesterol levels by exercising and minimizing your intake of saturated fat. Eat a balanced diet with plenty of fruit and vegetables. Blood tests for lipids and cholesterol should begin at age 12 and be repeated every 5  years. If your lipid or cholesterol levels are high, you are over 50, or you are at high risk for heart disease, you may need your cholesterol levels checked more frequently. Ongoing high lipid and cholesterol levels should be treated with medicines if diet and exercise are not working.  . If you smoke, find out from your health care provider how to quit. If you do not use tobacco, please do not start.  . If you choose to drink alcohol, please do not consume more than 2 drinks per day. One drink is considered to be 12 ounces (355 mL) of beer, 5  ounces (148 mL) of wine, or 1.5 ounces (44 mL) of liquor.  . If you are 60-59 years old, ask your health care provider if you should take aspirin to prevent strokes.  . Use sunscreen. Apply sunscreen liberally and repeatedly throughout the day. You should seek shade when your shadow is shorter than you. Protect yourself by wearing long sleeves, pants, a wide-brimmed hat, and sunglasses year round, whenever you are outdoors.  . Once a month, do a whole body skin exam, using a mirror to look at the skin on your back. Tell your health care provider of new moles, moles that have irregular borders, moles that are larger than a pencil eraser, or moles that have changed in shape or color.

## 2018-08-10 NOTE — Progress Notes (Signed)
PCP notes:   Health maintenance:  Eye exam - to be determined  Abnormal screenings:   None  Patient concerns:   None  Nurse concerns:  None  Next PCP appt:   08/14/18 @ 1415

## 2018-08-10 NOTE — Progress Notes (Signed)
Subjective:   Aaron Stafford is a 84 y.o. male who presents for Medicare Annual/Subsequent preventive examination.  Review of Systems:  N/A Cardiac Risk Factors include: advanced age (>89mn, >>80women);male gender;diabetes mellitus;dyslipidemia;hypertension     Objective:    Vitals: There were no vitals taken for this visit.  There is no height or weight on file to calculate BMI.  Advanced Directives 08/07/2018 03/04/2017 12/26/2015  Does Patient Have a Medical Advance Directive? Yes Yes Yes  Type of Advance Directive Living will;Healthcare [place]  Does patient want to make changes to medical advance directive? No - Patient declined - No - Patient declined  Copy of [place]? No - copy requested Yes No - copy requested    Tobacco Social History   Tobacco Use  Smoking Status Never Smoker  Smokeless Tobacco Never Used     Counseling given: No   Clinical Intake:  Pre-visit preparation completed: Yes  Pain : No/denies pain Pain Score: 0-No pain     Nutritional Status: BMI > 30  Obese Nutritional Risks: None  How often do you need to have someone help you when you read instructions, pamphlets, or other written materials from your doctor or pharmacy?: 1 - Never What is the last grade level you completed in school?: 12th grade + business degree/seminary  Interpreter Needed?: No  Comments: pt lives with spouse Information entered by :: LPinson, RN  Past Medical History:  Diagnosis Date  . CAD (coronary artery disease)    with stents  . [place] rh[place]    [place] Diabetes mellitus type II   . Diverticulosis 06/2011   by CT and colonoscopy  . Fatty liver 06/2011   by CT  . GERD (gastroesophageal reflux disease)   . History of BPH   . History of shingles   . HLD (hyperlipidemia)   . HTN (hypertension)   . Ischemic heart disease   .  [place]   [place] forearm, scalp  . [place]  . Shingles rash 09/26/2012   Past Surgical History:  Procedure Laterality Date  . ANKLE SURGERY  03/2002   Left ankle fracture  . CARDIAC CATHETERIZATION    . CHOLECYSTECTOMY  1985  . COLONOSCOPY     diverticulosis, tortuous colon with looping ([name]  . CORONARY ARTERY BYPASS GRAFT  05/1993   x6 MI Attemped angioplasty  . CORONARY STENT PLACEMENT  07/25/2009   Multi-Link VISION Cobalt chromium   . CYSTOSCOPY  10/03/08   Normal-Dr. [place]ernardo Heater . INGUINAL HERNIA REPAIR  11/2000   and ventral hernia repair   Family History  Problem Relation Age of Onset  . Kidney failure Mother   . Coronary artery disease Mother   . Heart failure Mother   . Hypertension Brother   . Heart attack Brother   . Alcohol abuse Brother   . Hypertension Brother   . Hypertension Sister    Social History   Socioeconomic History  . Marital status: Married    Spouse name: Not on file  . Number of children: 5  . Years of education: Not on file  . Highest education level: Not on file  Occupational History  . Occupation: PTheme park manager Social Needs  . Financial resource strain: Not on file  . Food insecurity    Worry: Not on file    Inability: Not on file  .  Transportation needs    Medical: Not on file    Non-medical: Not on file  Tobacco Use  . Smoking status: Never Smoker  . Smokeless tobacco: Never Used  Substance and Sexual Activity  . Alcohol use: No    Alcohol/week: 0.0 standard drinks  . Drug use: No  . Sexual activity: Not Currently  Lifestyle  . Physical activity    Days per week: Not on file    Minutes per session: Not on file  . Stress: Not on file  Relationships  . Social Herbalist on phone: Not on file    Gets together: Not on file    Attends religious service: Not on file    Active member of club or organization: Not on file    Attends meetings of clubs or organizations: Not on file     Relationship status: Not on file  Other Topics Concern  . 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 Alpha-Lipoic Acid 600 MG CAPS Take 1 capsule (600 mg total) by mouth 2 (two) times daily.  Marland Kitchen amLODipine (NORVASC) 5 MG tablet TAKE 1 TABLET BY MOUTH EVERY DAY  . aspirin 81 MG tablet Take 81 mg by mouth daily.    . Blood Glucose Monitoring Suppl ([place]) w/Device KIT 1 Device by Does not apply route daily. Test blood sugar once daily and as directed. Dx P61.95  . Cholecalciferol (VITAMIN D3) 1000 UNITS CAPS Take 1 capsule (1,000 Units total) by mouth daily. (Patient taking differently: Take 1 capsule by mouth as needed. )  . Cranberry 500 MG CAPS Take 4,200 mg by mouth daily.   . finasteride (PROSCAR) 5 MG tablet TAKE 1 TABLET BY MOUTH EVERY DAY  . fluticasone (FLONASE) 50 MCG/ACT nasal spray Place 2 sprays into both nostrils daily.  . furosemide (LASIX) 20 MG tablet TAKE 1 TABLET (20 MG TOTAL) BY MOUTH DAILY.  Marland Kitchen gabapentin (NEURONTIN) 300 MG capsule TAKE 2 CAPSULES (600 MG TOTAL) BY MOUTH 2 (TWO) TIMES DAILY.  . hydrochlorothiazide (HYDRODIURIL) 25 MG tablet TAKE 1 TABLET BY MOUTH EVERY DAY  . Lancets (ONETOUCH DELICA PLUS KDTOIZ12W) MISC CHECK BLOOD SUGAR ONCE DAILY AND AS INSTRUCTED. DX E11.42  . losartan (COZAAR) 25 MG tablet TAKE 1 TABLET BY MOUTH EVERY DAY  . metFORMIN (GLUCOPHAGE) 500 MG tablet TAKE 1 TABLET (500 MG TOTAL) BY MOUTH 2 (TWO) TIMES DAILY WITH A MEAL.  . metoprolol tartrate (LOPRESSOR) 25 MG tablet TAKE 1/2 TABLET BY MOUTH 2 TIMES DAY  . Multiple Vitamin (MULTIVITAMIN) tablet Take 1 tablet by mouth daily.  . nortriptyline (PAMELOR) 25 MG capsule Take 1-2 capsules  (25-50 mg total) by mouth at bedtime.  Glory Rosebush VERIO test strip USE TO TEST BLOOD SUGAR ONCE DAILY  . pravastatin (PRAVACHOL) 40 MG tablet TAKE 1 TABLET (40 MG TOTAL) BY MOUTH AT BEDTIME.  . traZODone (DESYREL) 50 MG tablet TAKE 1-1.5 TABLETS (50-75 MG TOTAL) BY MOUTH AT BEDTIME AS NEEDED FOR SLEEP.  Marland Kitchen valACYclovir (VALTREX) 1000 MG tablet Take 2 tablets (2,000 mg total) by mouth 2 (two) times daily. For 1 day  . vitamin B-12 (CYANOCOBALAMIN) 250 MCG tablet  Take 250 mcg by mouth once a week.    No facility-administered encounter medications on file as of 08/07/2018.     Activities of Daily Living In your present state of health, do you have any difficulty performing the following activities: 08/07/2018  Hearing? N  Vision? N  Difficulty concentrating or making decisions? N  Walking or climbing stairs? N  Dressing or bathing? N  Doing errands, shopping? N  Preparing Food and eating ? N  Using the Toilet? N  In the past six months, have you accidently leaked urine? N  Do you have problems with loss of bowel control? N  Managing your Medications? N  Managing your Finances? N  Housekeeping or managing your Housekeeping? N  Some recent data might be hidden    Patient Care Team: Ria Bush, MD as PCP - General (Family Medicine) Kem Parkinson, MD as Consulting Physician (Ophthalmology) Minna Merritts, MD as Consulting Physician (Cardiology)   Assessment:   This is a routine wellness examination for [place].   Hearing Screening   '125Hz'$  '250Hz'$  '500Hz'$  '1000Hz'$  '2000Hz'$  '3000Hz'$  '4000Hz'$  '6000Hz'$  '8000Hz'$   Right ear:           Left ear:           Vision Screening Comments: Vision exam in 2019 @ [facility] and Dietary recommendations Current Exercise Habits: The patient does not participate in regular exercise at present, Exercise limited by: None identified  Goals    . Follow up with Primary Care Provider     Starting 08/10/18, I will continue to take  medications as prescribed and to keep appointments with PCP as scheduled.        Fall Risk Fall Risk  08/07/2018 03/04/2017 12/26/2015 12/14/2014 08/09/2013  Falls in the past year? 0 Yes No No No  Comment - fell off a chair and hit back of head; no medical treatment - - -  Number falls in past yr: - 1 - - -  Injury with Fall? - Yes - - -   Depression Screen PHQ 2/9 Scores 08/07/2018 03/04/2017 12/26/2015 12/14/2014  PHQ - 2 Score 0 0 0 0  PHQ- 9 Score 0 0 - -    Cognitive Function MMSE - Mini Mental State Exam 08/07/2018 03/04/2017 12/26/2015  Orientation to time '5 5 5  '$ Orientation to Place '5 5 5  '$ Registration '3 3 3  '$ Attention/ Calculation 0 0 0  Recall '3 3 3  '$ Language- name 2 objects 0 0 0  Language- repeat '1 1 1  '$ Language- follow 3 step command 0 3 3  Language- read & follow direction 0 0 0  Write a sentence 0 0 0  Copy design 0 0 0  Total score '17 20 20     '$ PLEASE NOTE: A Mini-Cog screen was completed. Maximum score is 17. A value of 0 denotes this part of Folstein MMSE was not completed or the patient failed this part of the Mini-Cog screening.   Mini-Cog Screening Orientation to Time - Max 5 pts Orientation to Place - Max 5 pts Registration - Max 3 pts Recall - Max 3 pts Language Repeat - Max 1 pts      Immunization History  Administered Date(s) Administered  . Influenza Split 11/21/2011  . Influenza Whole 11/19/2003, 03/01/2009  . Influenza, High Dose Seasonal PF 10/03/2016, 10/27/2017  . Influenza,inj,Quad PF,6+ Mos 11/23/2012, 02/15/2014, 12/14/2014, 12/21/2015  . Pneumococcal Conjugate-13 08/09/2013  . Pneumococcal Polysaccharide-23 05/10/2004  . Td  12/26/1997, 04/12/2009  . Zoster 11/15/2010    Screening Tests Health Maintenance  Topic Date Due  . OPHTHALMOLOGY EXAM  02/18/2019 (Originally 03/11/2018)  . DTaP/Tdap/Td (1 - Tdap) 04/13/2019 (Originally 02/21/1953)  . INFLUENZA VACCINE  09/19/2018  . FOOT EXAM  09/23/2018  . HEMOGLOBIN A1C  02/06/2019  .  TETANUS/TDAP  04/13/2019  . PNA vac Low Risk Adult  Completed        Plan:     I have personally reviewed, addressed, and noted the following in the patient's chart:  A. Medical and social history B. Use of alcohol, tobacco or illicit drugs  C. Current medications and supplements D. Functional ability and status E.  Nutritional status F.  Physical activity G. Advance directives H. List of other physicians I.  Hospitalizations, surgeries, and ER visits in previous 12 months J.  Vitals (unless it is a telemedicine encounter) K. Screenings to include cognitive, depression, hearing, vision (NOTE: hearing and vision screenings not completed in telemedicine encounter) L. Referrals and appointments   In addition, I have reviewed and discussed with patient certain preventive protocols, quality metrics, and best practice recommendations. A written personalized care plan for preventive services and recommendations were provided to patient.  With patient's permission, we connected on 08/07/18 at  9:30 AM EDT. Interactive audio and video telecommunications were attempted with patient. This attempt was unsuccessful due to patient having technical difficulties OR patient did not have access to video capability.  Encounter was completed with audio only.  Two patient identifiers were used to ensure the encounter occurred with the correct person. Patient was in home and writer was in office.     Signed,   Lindell Noe, MHA, BS, RN Health Coach

## 2018-08-11 ENCOUNTER — Other Ambulatory Visit: Payer: Self-pay | Admitting: Family Medicine

## 2018-08-14 ENCOUNTER — Encounter: Payer: Self-pay | Admitting: Family Medicine

## 2018-08-14 ENCOUNTER — Other Ambulatory Visit: Payer: Self-pay

## 2018-08-14 ENCOUNTER — Ambulatory Visit (INDEPENDENT_AMBULATORY_CARE_PROVIDER_SITE_OTHER): Payer: Medicare Other | Admitting: Family Medicine

## 2018-08-14 VITALS — BP 126/64 | HR 61 | Temp 97.8°F | Ht 66.0 in | Wt 182.1 lb

## 2018-08-14 DIAGNOSIS — G6289 Other specified polyneuropathies: Secondary | ICD-10-CM | POA: Diagnosis not present

## 2018-08-14 DIAGNOSIS — N401 Enlarged prostate with lower urinary tract symptoms: Secondary | ICD-10-CM

## 2018-08-14 DIAGNOSIS — E1142 Type 2 diabetes mellitus with diabetic polyneuropathy: Secondary | ICD-10-CM

## 2018-08-14 DIAGNOSIS — R251 Tremor, unspecified: Secondary | ICD-10-CM

## 2018-08-14 DIAGNOSIS — I10 Essential (primary) hypertension: Secondary | ICD-10-CM

## 2018-08-14 DIAGNOSIS — Z951 Presence of aortocoronary bypass graft: Secondary | ICD-10-CM | POA: Diagnosis not present

## 2018-08-14 DIAGNOSIS — E782 Mixed hyperlipidemia: Secondary | ICD-10-CM | POA: Diagnosis not present

## 2018-08-14 DIAGNOSIS — E663 Overweight: Secondary | ICD-10-CM

## 2018-08-14 DIAGNOSIS — G4700 Insomnia, unspecified: Secondary | ICD-10-CM | POA: Diagnosis not present

## 2018-08-14 DIAGNOSIS — N138 Other obstructive and reflux uropathy: Secondary | ICD-10-CM | POA: Diagnosis not present

## 2018-08-14 MED ORDER — VALACYCLOVIR HCL 1 G PO TABS: 2000.0000 mg | ORAL_TABLET | Freq: Two times a day (BID) | ORAL | 3 refills | Status: DC

## 2018-08-14 NOTE — Assessment & Plan Note (Signed)
Continue gabapentin. Did not do well with pamelor

## 2018-08-14 NOTE — Assessment & Plan Note (Addendum)
Congratulated on weight loss to date - encouraged ongoing efforts. His goal weight is 175 lbs.

## 2018-08-14 NOTE — Assessment & Plan Note (Addendum)
Doing well back on trazodone + gabapentin. Did not tolerate pamelor.

## 2018-08-14 NOTE — Progress Notes (Signed)
This visit was conducted in person.  BP 126/64 (BP Location: Left Arm, Patient Position: Sitting, Cuff Size: Normal)   Pulse 61   Temp 97.8 F (36.6 C) (Tympanic)   Ht _0  (1.676 m)   Wt 182 lb 2 oz (82.6 kg)   SpO2 95%   BMI 29.40 kg/m    CC: AMW f/u visit  Subjective:    Patient ID: Aaron Stafford, male    DOB: 05-06-1934, 84 y.o.   MRN: [id number]  HPI: TEL HEVIA is a 84 y.o. male presenting on 08/14/2018 for Annual Exam (Pt 2. )   Saw Katha Cabal last week for medicare wellness visit. Note reviewed.  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 Started 1970.  10 lb weight loss - cutting down on portion sizes and choosing healthy options. Minimal exercise.   Notes L hand tremor over the past 2 months. More noticeable when holding something. Denies stiffness or anosmia. Right handed.   Preventative: COLONOSCOPY Date: 03/2005 diverticulosis, tortuous colon with looping [name]). Cologuard negative 2017. Denies bowel changes or blood in stool.  Prostate - h/o BPH, takes finasteride. Nocturia x1. No h/o prostate cancer.Stopped DRE, intermittent PSA. Flu shot yearly Pneumovax 2006. prevnar - 07/2013 Td 2011 zostavax 2012 shingrix - discussed Advanced planning - scanned into chart 06/2014. HCPOA is wife [name] then granddaughter [name]. Ok with tube feeds. Does not want prolonged life support Seat belt use discussed.  Sunscreen use discussed.H/o melanoma removed from scalp. Sees derm regularly.  Non smoker Alcohol - none Dentist - hasn't seen recently - no dental insurance. Flosses and brushes teeth regularly.  Eye exam yearly Bowels - notes increasing constipation, treating with metamucil. Bladder - no incontinence  Caffeine: 1 cup/day, 2 cups tea daily  Lives with [name] (wife) and 3 dogs  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[place], close [employer] family  Activity: sporadic stationary bicycle  Diet: good water, fruits/vegetables daily      Relevant past medical,  surgical, family and social history reviewed and updated as indicated. Interim medical history since our last visit reviewed. Allergies and medications reviewed and updated. Outpatient Medications Prior to Visit  Medication Sig Dispense Refill  . Alpha-Lipoic Acid 600 MG CAPS Take 1 capsule (600 mg total) by mouth 2 (two) times daily.    Marland Kitchen amLODipine (NORVASC) 5 MG tablet TAKE 1 TABLET BY MOUTH EVERY DAY 90 tablet 0  . aspirin 81 MG tablet Take 81 mg by mouth daily.      . Blood Glucose Monitoring Suppl (Calvin) w/Device KIT 1 Device by Does not apply route daily. Test blood sugar once daily and as directed. Dx E11.42 1 kit 0  . Cholecalciferol (VITAMIN D3) 1000 UNITS CAPS Take 1 capsule (1,000 Units total) by mouth daily. (Patient taking differently: Take 1 capsule by mouth as needed. ) 30 capsule   . Cranberry 500 MG CAPS Take 4,200 mg by mouth daily.     . finasteride (PROSCAR) 5 MG tablet TAKE 1 TABLET BY MOUTH EVERY DAY 90 tablet 0  . fluticasone (FLONASE) 50 MCG/ACT nasal spray Place 2 sprays into both nostrils daily. (Patient taking differently: Place 2 sprays into both nostrils daily. As needed) 16 g 0  . furosemide (LASIX) 20 MG tablet TAKE 1 TABLET BY MOUTH EVERY DAY 90 tablet 1  . gabapentin (NEURONTIN) 300 MG capsule TAKE 2 CAPSULES (600 MG TOTAL) BY MOUTH 2 (TWO) TIMES DAILY. 120 capsule 6  . hydrochlorothiazide (HYDRODIURIL) 25  MG tablet TAKE 1 TABLET BY MOUTH EVERY DAY 90 tablet 0  . Lancets (ONETOUCH DELICA PLUS [id number]) MISC CHECK BLOOD SUGAR ONCE DAILY AND AS INSTRUCTED. DX E11.42 100 each 3  . losartan (COZAAR) 25 MG tablet TAKE 1 TABLET BY MOUTH EVERY DAY 90 tablet 0  . metFORMIN (GLUCOPHAGE) 500 MG tablet TAKE 1 TABLET (500 MG TOTAL) BY MOUTH 2 (TWO) TIMES DAILY WITH A MEAL. 180 tablet 0  . metoprolol tartrate (LOPRESSOR) 25 MG tablet TAKE 1/2 TABLET BY MOUTH 2 TIMES DAY 90 tablet 0  . Multiple Vitamin (MULTIVITAMIN) tablet Take 1 tablet by mouth daily.     Glory Rosebush VERIO test strip USE TO TEST BLOOD SUGAR ONCE DAILY 100 each 1  . pravastatin (PRAVACHOL) 40 MG tablet TAKE 1 TABLET (40 MG TOTAL) BY MOUTH AT BEDTIME. 90 tablet 0  . traZODone (DESYREL) 50 MG tablet TAKE 1-1.5 TABLETS (50-75 MG TOTAL) BY MOUTH AT BEDTIME AS NEEDED FOR SLEEP. 90 tablet 0  . vitamin B-12 (CYANOCOBALAMIN) 250 MCG tablet Take 250 mcg by mouth once a week.     . valACYclovir (VALTREX) 1000 MG tablet Take 2 tablets (2,000 mg total) by mouth 2 (two) times daily. For 1 day 4 tablet 6  . nortriptyline (PAMELOR) 25 MG capsule Take 1-2 capsules (25-50 mg total) by mouth at bedtime. 60 capsule 6   No facility-administered medications prior to visit.      Per HPI unless specifically indicated in ROS section below Review of Systems Objective:    BP 126/64 (BP Location: Left Arm, Patient Position: Sitting, Cuff Size: Normal)   Pulse 61   Temp 97.8 F (36.6 C) (Tympanic)   Ht _0  (1.676 m)   Wt 182 lb 2 oz (82.6 kg)   SpO2 95%   BMI 29.40 kg/m   Wt Readings from Last 3 Encounters:  08/14/18 182 lb 2 oz (82.6 kg)  09/22/17 193 lb 8 oz (87.8 kg)  08/27/17 191 lb 12 oz (87 kg)    Physical Exam Vitals signs and nursing note reviewed.  Constitutional:      General: He is not in acute distress.    Appearance: Normal appearance. He is well-developed.  HENT:     Head: Normocephalic and atraumatic.     Right Ear: Hearing, tympanic membrane, ear canal and external ear normal.     Left Ear: Hearing, tympanic membrane, ear canal and external ear normal.     Nose: Nose normal.     Mouth/Throat:     Mouth: Mucous membranes are moist.     Pharynx: Uvula midline. No oropharyngeal exudate or posterior oropharyngeal erythema.  Eyes:     General: No scleral icterus.    Extraocular Movements: Extraocular movements intact.     Conjunctiva/sclera: Conjunctivae normal.     Pupils: Pupils are equal, round, and reactive to light.  Neck:     Musculoskeletal: Normal range of  motion and neck supple.  Cardiovascular:     Rate and Rhythm: Normal rate and regular rhythm.     Pulses: Normal pulses.          Radial pulses are 2+ on the right side and 2+ on the left side.     Heart sounds: Normal heart sounds. No murmur.  Pulmonary:     Effort: Pulmonary effort is normal. No respiratory distress.     Breath sounds: Normal breath sounds. No wheezing, rhonchi or rales.  Abdominal:     General: Bowel sounds are normal. There  is no distension.     Palpations: Abdomen is soft. There is no mass.     Tenderness: There is no abdominal tenderness. There is no guarding or rebound.     Hernia: No hernia is present.  Musculoskeletal: Normal range of motion.  Lymphadenopathy:     Cervical: No cervical adenopathy.  Skin:    General: Skin is warm and dry.     Findings: No rash.  Neurological:     General: No focal deficit present.     Mental Status: He is alert and oriented to person, place, and time.     Sensory: Sensation is intact.     Motor: Tremor present. No weakness or abnormal muscle tone.     Coordination: Coordination is intact.     Gait: Gait is intact.     Comments:  CN grossly intact, station and gait intact Postural tremor present L>R hand  Psychiatric:        Mood and Affect: Mood normal.        Behavior: Behavior normal.        Thought Content: Thought content normal.        Judgment: Judgment normal.       Results for orders placed or performed in visit on 08/07/18  VITAMIN D 25 Hydroxy (Vit-D Deficiency, Fractures)  Result Value Ref Range   VITD 39.00 30.00 - 100.00 ng/mL  Hemoglobin A1c  Result Value Ref Range   Hgb A1c MFr Bld 6.9 (H) 4.6 - 6.5 %  Comprehensive metabolic panel  Result Value Ref Range   Sodium 139 135 - 145 mEq/L   Potassium 4.4 3.5 - 5.1 mEq/L   Chloride 99 96 - 112 mEq/L   CO2 30 19 - 32 mEq/L   Glucose, Bld 135 (H) 70 - 99 mg/dL   BUN 18 6 - 23 mg/dL   Creatinine, Ser 0.96 0.40 - 1.50 mg/dL   Total Bilirubin 0.7 0.2  - 1.2 mg/dL   Alkaline Phosphatase 57 39 - 117 U/L   AST 14 0 - 37 U/L   ALT 18 0 - 53 U/L   Total Protein 6.5 6.0 - 8.3 g/dL   Albumin 4.3 3.5 - 5.2 g/dL   Calcium 9.9 8.4 - 10.5 mg/dL   GFR 74.90 >60.00 mL/min  Lipid panel  Result Value Ref Range   Cholesterol 121 0 - 200 mg/dL   Triglycerides 218.0 (H) 0.0 - 149.0 mg/dL   HDL 35.90 (L) >39.00 mg/dL   VLDL 43.6 (H) 0.0 - 40.0 mg/dL   Total CHOL/HDL Ratio 3    NonHDL 85.02   PSA  Result Value Ref Range   PSA 0.06 (L) 0.10 - 4.00 ng/mL  LDL cholesterol, direct  Result Value Ref Range   Direct LDL 59.0 mg/dL   Assessment & Plan:   Problem List Items Addressed This Visit    Type 2 diabetes, controlled, with peripheral neuropathy ([place]) - Primary    Chronic, stable. Continue current regimen.       Tremor    New, without parkinsonian signs. Anticipate possible essential tremor - will watch for now.       S/P CABG (coronary artery bypass graft)   Peripheral neuropathy    Continue gabapentin. Did not do well with pamelor       Overweight with body mass index (BMI) 25.0-29.9    Congratulated on weight loss to date - encouraged ongoing efforts. His goal weight is 175 lbs.       Insomnia  Doing well back on trazodone + gabapentin. Did not tolerate pamelor.       HTN (hypertension)    Chronic, stable. Continue current regimen.       HLD (hyperlipidemia)    Chronic, stable LDL. Trig elevated. The ASCVD Risk score Mikey Bussing DC Jr., et al., 2013) failed to calculate for the following reasons:   The 2013 ASCVD risk score is only valid for ages 29 to 31       BPH with obstruction/lower urinary tract symptoms    Continue finasteride.           Meds ordered this encounter  Medications  . valACYclovir (VALTREX) 1000 MG tablet    Sig: Take 2 tablets (2,000 mg total) by mouth 2 (two) times daily. For 1 day    Dispense:  4 tablet    Refill:  3   No orders of the defined types were placed in this encounter.   Follow  up plan: Return in about 6 months (around 02/13/2019) for follow up visit.  Ria Bush, MD

## 2018-08-14 NOTE — Assessment & Plan Note (Signed)
Chronic, stable. Continue current regimen. 

## 2018-08-14 NOTE — Assessment & Plan Note (Signed)
-   Continue finasteride 

## 2018-08-14 NOTE — Patient Instructions (Addendum)
If interested, check with pharmacy about new 2 shot shingles series (shingrix).  You are doing well! Continue current medicines.  Return in 6 months for follow up visit.  We will watch tremor for now, let me know if worsening.

## 2018-08-14 NOTE — Assessment & Plan Note (Signed)
New, without parkinsonian signs. Anticipate possible essential tremor - will watch for now.

## 2018-08-14 NOTE — Assessment & Plan Note (Signed)
Chronic, stable LDL. Trig elevated. The ASCVD Risk score Mikey Bussing DC Jr., et al., 2013) failed to calculate for the following reasons:   The 2013 ASCVD risk score is only valid for ages 46 to 94

## 2018-08-17 NOTE — Progress Notes (Signed)
I reviewed health advisor's note, was available for consultation, and agree with documentation and plan.  

## 2018-09-01 ENCOUNTER — Other Ambulatory Visit: Payer: Self-pay | Admitting: Family Medicine

## 2018-09-08 ENCOUNTER — Other Ambulatory Visit: Payer: Self-pay | Admitting: Family Medicine

## 2018-09-20 ENCOUNTER — Other Ambulatory Visit: Payer: Self-pay | Admitting: Family Medicine

## 2018-09-21 ENCOUNTER — Other Ambulatory Visit: Payer: Self-pay | Admitting: Family Medicine

## 2018-10-07 ENCOUNTER — Other Ambulatory Visit: Payer: Self-pay | Admitting: Family Medicine

## 2018-10-09 NOTE — Telephone Encounter (Signed)
Gabapentin Last filled:  09/13/18, #120 Last OV:  08/14/18, CPE Prt 2 Next OV:  02/01/19, 6 mo f/u

## 2018-11-03 ENCOUNTER — Other Ambulatory Visit: Payer: Self-pay | Admitting: Family Medicine

## 2018-11-03 NOTE — Telephone Encounter (Signed)
Pharmacy is requesting 90 day supply.  Ok to increase to #360?

## 2018-12-03 DIAGNOSIS — Z23 Encounter for immunization: Secondary | ICD-10-CM | POA: Diagnosis not present

## 2018-12-10 DIAGNOSIS — H2513 Age-related nuclear cataract, bilateral: Secondary | ICD-10-CM | POA: Diagnosis not present

## 2018-12-10 LAB — HM DIABETES EYE EXAM

## 2018-12-15 ENCOUNTER — Encounter: Payer: Self-pay | Admitting: Family Medicine

## 2018-12-27 ENCOUNTER — Other Ambulatory Visit: Payer: Self-pay | Admitting: Family Medicine

## 2019-02-01 ENCOUNTER — Ambulatory Visit: Payer: Medicare Other | Admitting: Family Medicine

## 2019-02-03 ENCOUNTER — Encounter: Payer: Self-pay | Admitting: Family Medicine

## 2019-02-03 ENCOUNTER — Other Ambulatory Visit: Payer: Self-pay

## 2019-02-03 ENCOUNTER — Ambulatory Visit (INDEPENDENT_AMBULATORY_CARE_PROVIDER_SITE_OTHER): Payer: Medicare Other | Admitting: Family Medicine

## 2019-02-03 VITALS — BP 140/70 | HR 59 | Temp 97.6°F | Ht 66.0 in | Wt 177.0 lb

## 2019-02-03 DIAGNOSIS — I10 Essential (primary) hypertension: Secondary | ICD-10-CM | POA: Diagnosis not present

## 2019-02-03 DIAGNOSIS — R251 Tremor, unspecified: Secondary | ICD-10-CM

## 2019-02-03 DIAGNOSIS — E663 Overweight: Secondary | ICD-10-CM | POA: Diagnosis not present

## 2019-02-03 DIAGNOSIS — E1142 Type 2 diabetes mellitus with diabetic polyneuropathy: Secondary | ICD-10-CM

## 2019-02-03 LAB — POCT GLYCOSYLATED HEMOGLOBIN (HGB A1C): Hemoglobin A1C: 6.5 % — AB (ref 4.0–5.6)

## 2019-02-03 MED ORDER — CRANBERRY 500 MG PO CAPS: 2.0000 | ORAL_CAPSULE | Freq: Every day | ORAL | Status: AC

## 2019-02-03 MED ORDER — VALACYCLOVIR HCL 1 G PO TABS: 2000.0000 mg | ORAL_TABLET | Freq: Two times a day (BID) | ORAL | 3 refills | Status: DC

## 2019-02-03 NOTE — Progress Notes (Signed)
This visit was conducted in person.  BP 140/70 (BP Location: Left Arm, Patient Position: Sitting, Cuff Size: Normal)   Pulse (!) 59   Temp 97.6 F (36.4 C) (Temporal)   Ht 5' 6" (1.676 m)   Wt 177 lb (80.3 kg)   SpO2 98%   BMI 28.57 kg/m    CC: 6 mo f/u visit Subjective:    Patient ID: Aaron Stafford, male    DOB: Dec 04, 1934, 84 y.o.   MRN: [id number]  HPI: Aaron Stafford is a 84 y.o. male presenting on 02/03/2019 for Follow-up (Here for 6 mo f/u. )   Son Konrad Dolores with covid, just diagnosed with NHL.   DM - does not regularly check sugars. Compliant with antihyperglycemic regimen which includes: metformin 577m bid. Denies low sugars or hypoglycemic symptoms. Paresthesias from known neuropathy, managing with alpha lipoic acid as well as gabapentin [id number] daily. Last diabetic eye exam 11/2018. Pneumovax: 2006. Prevnar: 2015. Glucometer brand: one-touch verio flex. DSME: declined. Lab Results  Component Value Date   HGBA1C 6.5 (A) 02/03/2019   Diabetic Foot Exam - Simple   Simple Foot Form Diabetic Foot exam was performed with the following findings: Yes 02/03/2019  9:07 AM  Visual Inspection No deformities, no ulcerations, no other skin breakdown bilaterally: Yes Sensation Testing Intact to touch and monofilament testing bilaterally: Yes Pulse Check Posterior Tibialis and Dorsalis pulse intact bilaterally: Yes Comments Cold feet Diminished DP but 2+ PT bilaterally    Lab Results  Component Value Date   MICROALBUR 3.4 (H) 02/21/2014       Relevant past medical, surgical, family and social history reviewed and updated as indicated. Interim medical history since our last visit reviewed. Allergies and medications reviewed and updated. Outpatient Medications Prior to Visit  Medication Sig Dispense Refill  . Alpha-Lipoic Acid 600 MG CAPS Take 1 capsule (600 mg total) by mouth 2 (two) times daily.    . Marland KitchenmLODipine (NORVASC) 5 MG tablet TAKE 1 TABLET BY MOUTH EVERY DAY  90 tablet 3  . aspirin 81 MG tablet Take 81 mg by mouth daily.      . Blood Glucose Monitoring Suppl (ONSnyderw/Device KIT 1 Device by Does not apply route daily. Test blood sugar once daily and as directed. Dx E11.42 1 kit 0  . Cholecalciferol (VITAMIN D3) 1000 UNITS CAPS Take 1 capsule (1,000 Units total) by mouth daily. (Patient taking differently: Take 1 capsule by mouth as needed. ) 30 capsule   . finasteride (PROSCAR) 5 MG tablet TAKE 1 TABLET BY MOUTH EVERY DAY 90 tablet 3  . fluticasone (FLONASE) 50 MCG/ACT nasal spray Place 2 sprays into both nostrils daily. (Patient taking differently: Place 2 sprays into both nostrils daily. As needed) 16 g 0  . furosemide (LASIX) 20 MG tablet TAKE 1 TABLET BY MOUTH EVERY DAY 90 tablet 1  . gabapentin (NEURONTIN) 300 MG capsule TAKE 2 CAPSULES BY MOUTH TWICE A DAY 360 capsule 1  . hydrochlorothiazide (HYDRODIURIL) 25 MG tablet TAKE 1 TABLET BY MOUTH EVERY DAY 90 tablet 3  . Lancets (ONETOUCH DELICA PLUS [id number] CHECK BLOOD SUGAR ONCE DAILY AND AS INSTRUCTED. DX E11.42 100 each 3  . losartan (COZAAR) 25 MG tablet TAKE 1 TABLET BY MOUTH EVERY DAY 90 tablet 3  . metFORMIN (GLUCOPHAGE) 500 MG tablet TAKE 1 TABLET (500 MG TOTAL) BY MOUTH 2 (TWO) TIMES DAILY WITH A MEAL. 180 tablet 1  . metoprolol tartrate (LOPRESSOR) 25 MG tablet TAKE  1/2 TABLET BY MOUTH 2 TIMES DAY 90 tablet 3  . Multiple Vitamin (MULTIVITAMIN) tablet Take 1 tablet by mouth daily.    Glory Rosebush VERIO test strip USE TO TEST BLOOD SUGAR ONCE DAILY 100 each 1  . pravastatin (PRAVACHOL) 40 MG tablet TAKE 1 TABLET BY MOUTH EVERYDAY AT BEDTIME 90 tablet 3  . traZODone (DESYREL) 50 MG tablet TAKE 1 TO 1 AND 1/2 TABLETS BY MOUTH AT BEDTIME AS NEEDED FOR SLEEP 90 tablet 1  . vitamin B-12 (CYANOCOBALAMIN) 250 MCG tablet Take 250 mcg by mouth once a week.     . Cranberry 500 MG CAPS Take 4,200 mg by mouth daily.     . valACYclovir (VALTREX) 1000 MG tablet Take 2 tablets  (2,000 mg total) by mouth 2 (two) times daily. For 1 day 4 tablet 3   No facility-administered medications prior to visit.     Per HPI unless specifically indicated in ROS section below Review of Systems Objective:    BP 140/70 (BP Location: Left Arm, Patient Position: Sitting, Cuff Size: Normal)   Pulse (!) 59   Temp 97.6 F (36.4 C) (Temporal)   Ht 5' 6" (1.676 m)   Wt 177 lb (80.3 kg)   SpO2 98%   BMI 28.57 kg/m   Wt Readings from Last 3 Encounters:  02/03/19 177 lb (80.3 kg)  08/14/18 182 lb 2 oz (82.6 kg)  09/22/17 193 lb 8 oz (87.8 kg)    Physical Exam Vitals and nursing note reviewed.  Constitutional:      General: He is not in acute distress.    Appearance: He is well-developed.  HENT:     Head: Normocephalic and atraumatic.     Right Ear: External ear normal.     Left Ear: External ear normal.     Nose: Nose normal.     Mouth/Throat:     Pharynx: No oropharyngeal exudate.  Eyes:     General: No scleral icterus.    Conjunctiva/sclera: Conjunctivae normal.     Pupils: Pupils are equal, round, and reactive to light.  Cardiovascular:     Rate and Rhythm: Normal rate and regular rhythm.     Heart sounds: Normal heart sounds. No murmur.  Pulmonary:     Effort: Pulmonary effort is normal. No respiratory distress.     Breath sounds: Normal breath sounds. No wheezing or rales.  Musculoskeletal:     Cervical back: Normal range of motion and neck supple.     Comments: See HPI for foot exam if done  Lymphadenopathy:     Cervical: No cervical adenopathy.  Skin:    General: Skin is warm and dry.     Findings: No rash.       Results for orders placed or performed in visit on 02/03/19  POCT glycosylated hemoglobin (Hb A1C)  Result Value Ref Range   Hemoglobin A1C 6.5 (A) 4.0 - 5.6 %   HbA1c POC (<> result, manual entry)     HbA1c, POC (prediabetic range)     HbA1c, POC (controlled diabetic range)     Assessment & Plan:  This visit occurred during the  SARS-CoV-2 public health emergency.  Safety protocols were in place, including screening questions prior to the visit, additional usage of staff PPE, and extensive cleaning of exam room while observing appropriate contact time as indicated for disinfecting solutions.   Problem List Items Addressed This Visit    Type 2 diabetes, controlled, with peripheral neuropathy ([place]) - Primary  Chronic, improved. Continue current regimen. Does not check sugars at home.       Relevant Orders   POCT glycosylated hemoglobin (Hb A1C) (Completed)   Tremor    Stable period on gabapentin.      Overweight with body mass index (BMI) 25.0-29.9    Ongoing weight loss noted - his goal weight has been 175 lbs.       HTN (hypertension)    Chronic, stable. Continue current regimen.           Meds ordered this encounter  Medications  . valACYclovir (VALTREX) 1000 MG tablet    Sig: Take 2 tablets (2,000 mg total) by mouth 2 (two) times daily. For 1 day    Dispense:  4 tablet    Refill:  3  . Cranberry 500 MG CAPS    Sig: Take 2 capsules (1,000 mg total) by mouth daily.   Orders Placed This Encounter  Procedures  . POCT glycosylated hemoglobin (Hb A1C)    Patient Instructions  If interested, check with pharmacy about new 2 shot shingles series (shingrix).  Continue current medicines. Good to see you today. You are doing well today. Return as needed or in 6 months for wellness visit and follow up visit.    Follow up plan: Return in about 6 months (around 08/04/2019) for medicare wellness visit, follow up visit.  Ria Bush, MD

## 2019-02-03 NOTE — Assessment & Plan Note (Addendum)
Ongoing weight loss noted - his goal weight has been 175 lbs.

## 2019-02-03 NOTE — Assessment & Plan Note (Addendum)
Chronic, improved. Continue current regimen. Does not check sugars at home.

## 2019-02-03 NOTE — Assessment & Plan Note (Signed)
Chronic, stable. Continue current regimen. 

## 2019-02-03 NOTE — Assessment & Plan Note (Signed)
Stable period on gabapentin.

## 2019-02-03 NOTE — Patient Instructions (Addendum)
If interested, check with pharmacy about new 2 shot shingles series (shingrix).  Continue current medicines. Good to see you today. You are doing well today. Return as needed or in 6 months for wellness visit and follow up visit.

## 2019-03-02 ENCOUNTER — Other Ambulatory Visit: Payer: Self-pay | Admitting: Family Medicine

## 2019-03-28 ENCOUNTER — Other Ambulatory Visit: Payer: Self-pay | Admitting: Family Medicine

## 2019-04-28 ENCOUNTER — Other Ambulatory Visit: Payer: Self-pay | Admitting: Family Medicine

## 2019-04-28 NOTE — Telephone Encounter (Signed)
Gabapentin Last filled:  02/04/19, #360 Last OV:  02/03/19, 6 mo f/u Next OV:  08/18/19, AWV prt 2

## 2019-05-03 NOTE — Progress Notes (Signed)
Cardiology Office Note  Date:  05/05/2019   ID:  Aaron Stafford, DOB 09/02/1934, MRN [id number]  PCP:  Ria Bush, MD   Chief Complaint  Patient presents with  . office visit    Pt concerned w/ a small knot under chin. Pt states if there is another option for metoprolol it is directed to take 1/2 tab but once cut in half there is not much on the other half to take (wanting a bigger pill to replace it). Meds verbally reviewed w/ pt.    HPI:  Aaron Stafford is a very pleasant 85 year old gentleman with a history of  coronary artery disease,   bypass surgery, With LIMA graft to the LAD, vein graft to the RCA, vein graft to the OM  previous stent placed to the vein graft to the RCA in June 2011,  hyperlipidemia,  hypertension  Kidney stones who presents for routine follow up of his coronary artery disease.   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  BP elevated today, long walk in  Helping wife, bone healing issue in leg Help surgery  Labs reviewed HBA1C 6.5 LDL 81  No significant leg swelling. Doing well on his current medication regimen.  EKG personally reviewed by myself on todays visit shows normal sinus rhythm with rate 54 beats per minute with interventricular conduction delay, left anterior fascicular block, nonspecific ST abnormality   PMH:   has a past medical history of CAD (coronary artery disease), Chronic rhinitis, Diabetes mellitus type II, Diverticulosis (06/2011), Fatty liver (06/2011), GERD (gastroesophageal reflux disease), History of BPH, History of shingles, HLD (hyperlipidemia), HTN (hypertension), Ischemic heart disease, Melanoma ([place]) (12/2015), MI (myocardial infarction) ([place]) (1995), Shingles rash (09/26/2012), and Sialadenitis (05/01/2017).  PSH:    Past Surgical History:  Procedure Laterality Date  . ANKLE SURGERY  03/2002   Left ankle fracture  . CARDIAC CATHETERIZATION    . CHOLECYSTECTOMY  1985  . COLONOSCOPY     diverticulosis, tortuous colon with looping  Aaron Stafford)  . CORONARY ARTERY BYPASS GRAFT  05/1993   x6 MI Attemped angioplasty  . CORONARY STENT PLACEMENT  07/25/2009   Multi-Link VISION Cobalt chromium   . CYSTOSCOPY  10/03/08   Normal-Dr. Bernardo Heater  . INGUINAL HERNIA REPAIR  11/2000   and ventral hernia repair    Current Outpatient Medications  Medication Sig Dispense Refill  . Alpha-Lipoic Acid 600 MG CAPS Take 1 capsule (600 mg total) by mouth 2 (two) times daily.    Marland Kitchen amLODipine (NORVASC) 5 MG tablet TAKE 1 TABLET BY MOUTH EVERY DAY 90 tablet 3  . aspirin 81 MG tablet Take 81 mg by mouth daily.      . Blood Glucose Monitoring Suppl ([place]) w/Device KIT 1 Device by Does not apply route daily. Test blood sugar once daily and as directed. Dx E11.42 1 kit 0  . Cholecalciferol (VITAMIN D3) 1000 UNITS CAPS Take 1 capsule (1,000 Units total) by mouth daily. (Patient taking differently: Take 1 capsule by mouth as needed. ) 30 capsule   . Cranberry 500 MG CAPS Take 2 capsules (1,000 mg total) by mouth daily.    . finasteride (PROSCAR) 5 MG tablet TAKE 1 TABLET BY MOUTH EVERY DAY 90 tablet 3  . fluticasone (FLONASE) 50 MCG/ACT nasal spray Place 2 sprays into both nostrils daily. (Patient taking differently: Place 2 sprays into both nostrils daily. As needed) 16 g 0  . furosemide (LASIX) 20 MG tablet TAKE 1 TABLET BY MOUTH EVERY DAY 90  tablet 1  . gabapentin (NEURONTIN) 300 MG capsule TAKE 2 CAPSULES BY MOUTH TWICE A DAY 360 capsule 1  . hydrochlorothiazide (HYDRODIURIL) 25 MG tablet TAKE 1 TABLET BY MOUTH EVERY DAY 90 tablet 3  . Lancets (ONETOUCH DELICA PLUS HYIFOY77A) MISC CHECK BLOOD SUGAR ONCE DAILY AND AS INSTRUCTED. DX E11.42 100 each 3  . losartan (COZAAR) 25 MG tablet TAKE 1 TABLET BY MOUTH EVERY DAY 90 tablet 3  . metFORMIN (GLUCOPHAGE) 500 MG tablet TAKE 1 TABLET (500 MG TOTAL) BY MOUTH 2 (TWO) TIMES DAILY WITH A MEAL. 180 tablet 1  . metoprolol tartrate (LOPRESSOR) 25 MG tablet TAKE 1/2 TABLET BY MOUTH 2 TIMES  DAY 90 tablet 3  . Multiple Vitamin (MULTIVITAMIN) tablet Take 1 tablet by mouth daily.    Aaron Stafford VERIO test strip USE TO TEST BLOOD SUGAR ONCE DAILY 100 each 1  . pravastatin (PRAVACHOL) 40 MG tablet TAKE 1 TABLET BY MOUTH EVERYDAY AT BEDTIME 90 tablet 3  . traZODone (DESYREL) 50 MG tablet TAKE 1 TO 1 AND 1/2 TABLETS BY MOUTH AT BEDTIME AS NEEDED FOR SLEEP 90 tablet 1  . valACYclovir (VALTREX) 1000 MG tablet Take 2 tablets (2,000 mg total) by mouth 2 (two) times daily. For 1 day 4 tablet 3  . vitamin B-12 (CYANOCOBALAMIN) 250 MCG tablet Take 250 mcg by mouth once a week.      No current facility-administered medications for this visit.     Allergies:   Morphine sulfate, Nitroglycerin, and Contrast media [iodinated diagnostic agents]   Social History:  The patient  reports that he has never smoked. He has never used smokeless tobacco. He reports that he does not drink alcohol or use drugs.   Family History:   family history includes Alcohol abuse in his brother; Coronary artery disease in his mother; Heart attack in his brother; Heart failure in his mother; Hypertension in his brother, brother, and sister; Kidney failure in his mother.    Review of Systems: Review of Systems  Constitutional: Negative.   HENT: Negative.   Respiratory: Negative.   Cardiovascular: Negative.   Gastrointestinal: Negative.   Musculoskeletal: Negative.   Neurological: Negative.   Psychiatric/Behavioral: Negative.   All other systems reviewed and are negative.   PHYSICAL EXAM: VS:  BP (!) 160/86 (BP Location: Left Arm, Patient Position: Sitting, Cuff Size: Normal)   Pulse (!) 54   Ht '5\' 9"'$  (1.753 m)   Wt 180 lb 6 oz (81.8 kg)   SpO2 98%   BMI 26.64 kg/m  , BMI Body mass index is 26.64 kg/m. GEN: Well nourished, well developed, in no acute distress  HEENT: normal  Neck: no JVD, carotid bruits, or masses Cardiac: RRR; no murmurs, rubs, or gallops,no edema  Respiratory:  clear to auscultation  bilaterally, normal work of breathing GI: soft, nontender, nondistended, + BS MS: no deformity or atrophy  Skin: warm and dry, no rash Neuro:  Strength and sensation are intact Psych: euthymic mood, full affect   Recent Labs: 08/07/2018: ALT 18; BUN 18; Creatinine, Ser 0.96; Potassium 4.4; Sodium 139    Lipid Panel Lab Results  Component Value Date   CHOL 121 08/07/2018   HDL 35.90 (L) 08/07/2018   LDLCALC 49 08/05/2013   TRIG 218.0 (H) 08/07/2018     Wt Readings from Last 3 Encounters:  05/05/19 180 lb 6 oz (81.8 kg)  02/03/19 177 lb (80.3 kg)  08/14/18 182 lb 2 oz (82.6 kg)      ASSESSMENT AND  PLAN:  Atherosclerosis of native coronary artery of native heart with stable angina pectoris ([place]) - Plan: EKG 12-Lead Currently with no symptoms of angina. No further workup at this time. Continue current medication regimen.  Type 2 diabetes, controlled, with peripheral neuropathy ([place]) - Plan: EKG 12-Lead Hemoglobin A1c 6.5 Lifestyle modifications,   S/P CABG (coronary artery bypass graft) - Plan: EKG 12-Lead Doing well since surgery 2011 Currently with no symptoms of angina. No further workup at this time. Continue current medication regimen.  Mixed hyperlipidemia Cholesterol is at goal on the current lipid regimen. No changes to the medications were made.  Essential hypertension Hold the metoprolol BP stable  Disposition:   F/U  12 months   Total encounter time more than 25 minutes  Greater than 50% was spent in counseling and coordination of care with the patient    Orders Placed This Encounter  Procedures  . EKG 12-Lead     Signed, Esmond Plants, M.D., Ph.D. 05/05/2019  [place], [place]

## 2019-05-05 ENCOUNTER — Other Ambulatory Visit: Payer: Self-pay

## 2019-05-05 ENCOUNTER — Ambulatory Visit (INDEPENDENT_AMBULATORY_CARE_PROVIDER_SITE_OTHER): Payer: Medicare Other | Admitting: Cardiovascular Disease

## 2019-05-05 ENCOUNTER — Encounter: Payer: Self-pay | Admitting: Cardiovascular Disease

## 2019-05-05 VITALS — BP 140/80 | HR 54 | Ht 69.0 in | Wt 180.4 lb

## 2019-05-05 DIAGNOSIS — I25118 Atherosclerotic heart disease of native coronary artery with other forms of angina pectoris: Secondary | ICD-10-CM

## 2019-05-05 DIAGNOSIS — E782 Mixed hyperlipidemia: Secondary | ICD-10-CM

## 2019-05-05 DIAGNOSIS — I10 Essential (primary) hypertension: Secondary | ICD-10-CM | POA: Diagnosis not present

## 2019-05-05 DIAGNOSIS — Z951 Presence of aortocoronary bypass graft: Secondary | ICD-10-CM

## 2019-05-05 DIAGNOSIS — E1142 Type 2 diabetes mellitus with diabetic polyneuropathy: Secondary | ICD-10-CM | POA: Diagnosis not present

## 2019-05-05 NOTE — Patient Instructions (Signed)
Medication Instructions:  Hold the metoprolol  If you need a refill on your cardiac medications before your next appointment, please call your pharmacy.    Lab work: No new labs needed   If you have labs (blood work) drawn today and your tests are completely normal, you will receive your results only by: Marland Kitchen MyChart Message (if you have MyChart) OR . A paper copy in the mail If you have any lab test that is abnormal or we need to change your treatment, we will call you to review the results.   Testing/Procedures: No new testing needed   Follow-Up: At [facility], you and your health needs are our priority.  As part of our continuing mission to provide you with exceptional heart care, we have created designated Provider Care Teams.  These Care Teams include your primary Cardiologist (physician) and Advanced Practice Providers (APPs -  Physician Assistants and Nurse Practitioners) who all work together to provide you with the care you need, when you need it.  . You will need a follow up appointment in 12 months   . Providers on your designated Care Team:   . Murray Hodgkins, NP . Christell Faith, PA-C . Marrianne Mood, PA-C  Any Other Special Instructions Will Be Listed Below (If Applicable).  For educational health videos Log in to : [email or web address] Or : [email or web address], password : [email or web address]

## 2019-07-04 ENCOUNTER — Other Ambulatory Visit: Payer: Self-pay | Admitting: Family Medicine

## 2019-07-21 ENCOUNTER — Other Ambulatory Visit: Payer: Self-pay | Admitting: Family Medicine

## 2019-07-22 ENCOUNTER — Other Ambulatory Visit: Payer: Self-pay | Admitting: Unknown Physician Specialty

## 2019-07-22 DIAGNOSIS — R220 Localized swelling, mass and lump, head: Secondary | ICD-10-CM

## 2019-07-22 DIAGNOSIS — D37032 Neoplasm of uncertain behavior of the submandibular salivary glands: Secondary | ICD-10-CM | POA: Diagnosis not present

## 2019-07-26 ENCOUNTER — Telehealth: Payer: Self-pay | Admitting: Family Medicine

## 2019-07-26 MED ORDER — FREESTYLE LIBRE SENSOR SYSTEM MISC: 0 refills | Status: DC

## 2019-07-26 MED ORDER — FREESTYLE LIBRE 14 DAY SENSOR MISC: 6 refills | Status: DC

## 2019-07-26 MED ORDER — FREESTYLE LIBRE 14 DAY READER DEVI: 6 refills | Status: DC

## 2019-07-26 NOTE — Telephone Encounter (Signed)
I saw his wife at appt today - she mentioned patient is interested in continuous glucose monitor to check sugars. discussed he may not qualify per medicare criteria for coverage of this but I will send in for them to price out.  Lab Results  Component Value Date   HGBA1C 6.5 (A) 02/03/2019  Controlled with metformin 500mg  bid.   Will forward to Michiel Cowboy to see if he may be candidate for trial of one of our continuous glucose monitors.   I cannot attach [name] to this phone note.

## 2019-07-27 NOTE — Telephone Encounter (Signed)
Contacted pharmacy and they said Elenor Legato was not covered but with a discount care he could get the sensor for $70.96 per month and a receiver for a one time cost of $64.99. But they also report the company is giving incentives for the [place] 2 if the pt contacts the co.   Advised pt and he said he would just stick with what he has at the moment. Advised if he changes his mind to contact the office. Pt verbalized understanding.

## 2019-07-30 ENCOUNTER — Other Ambulatory Visit: Payer: Self-pay

## 2019-07-30 ENCOUNTER — Ambulatory Visit
Admission: RE | Admit: 2019-07-30 | Discharge: 2019-07-30 | Disposition: A | Discharge status: Home / Self Care | Payer: Medicare Other | Source: Ambulatory Visit | Attending: Unknown Physician Specialty | Admitting: Unknown Physician Specialty

## 2019-07-30 DIAGNOSIS — R220 Localized swelling, mass and lump, head: Secondary | ICD-10-CM | POA: Insufficient documentation

## 2019-07-30 DIAGNOSIS — R221 Localized swelling, mass and lump, neck: Secondary | ICD-10-CM | POA: Diagnosis not present

## 2019-07-30 IMAGING — US US SOFT TISSUE HEAD/NECK
1 series · 8 of 8 positions shown · non-contrast
Comparison: None.

CLINICAL DATA: Mass of the right submandibular region.

EXAM:
ULTRASOUND OF HEAD/NECK SOFT TISSUES
TECHNIQUE: Ultrasound examination of the head and neck soft tissues was
performed in the area of clinical concern.

[Series 1: us soft tissue head/neck · 0.07mm/px · 8 of 8 slices shown]
[im 1/8]
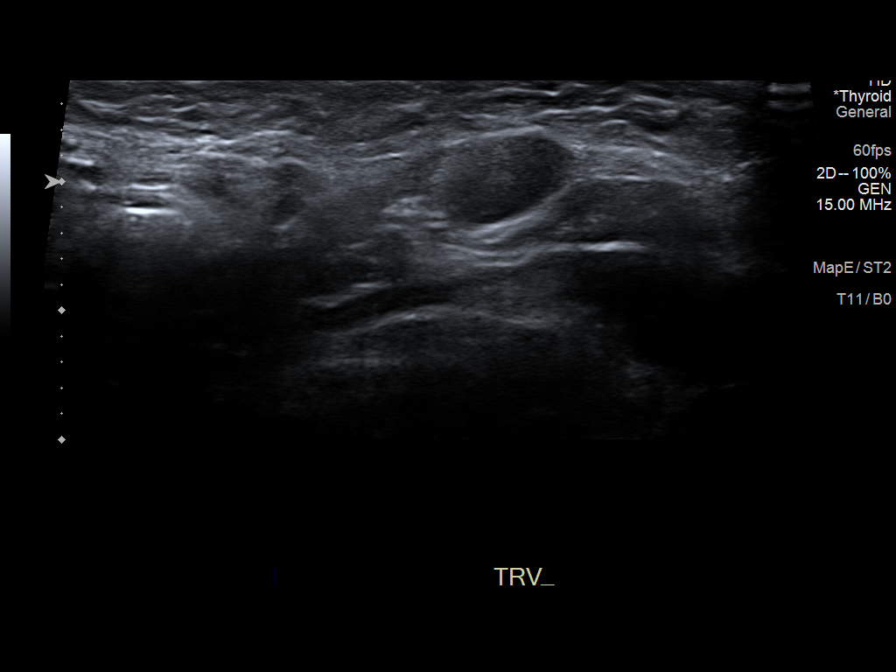
[im 2/8]
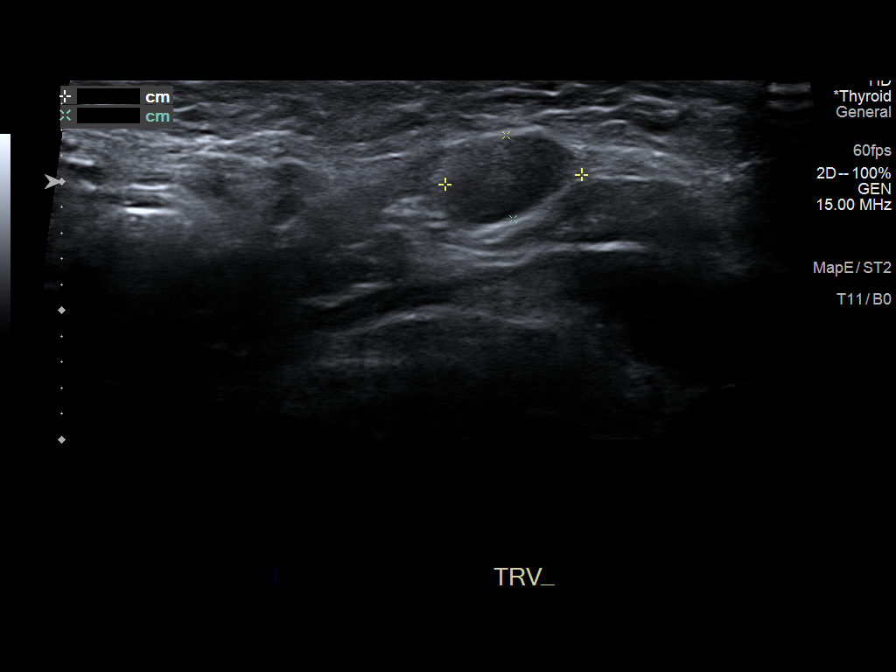
[im 3/8]
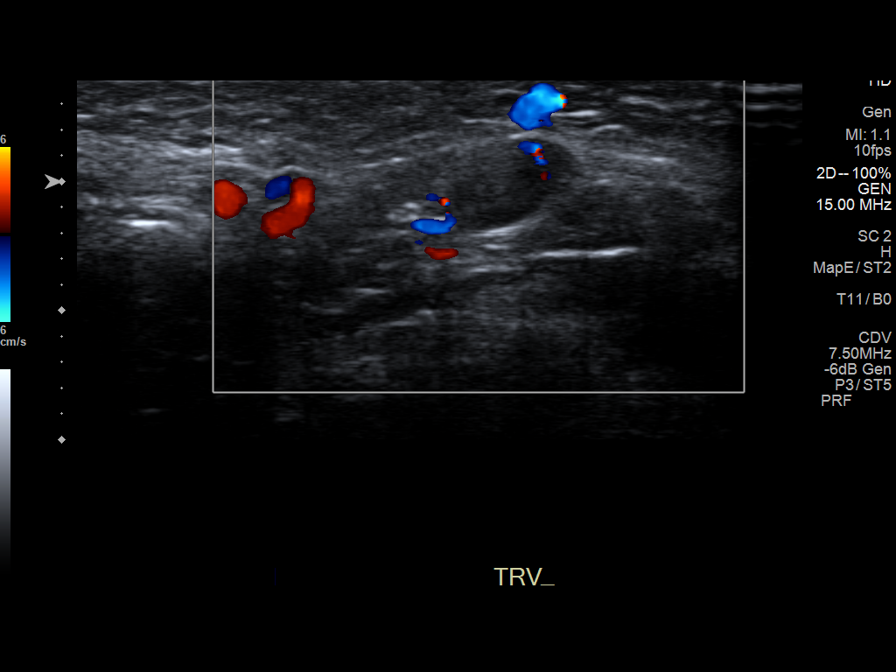
[im 4/8]
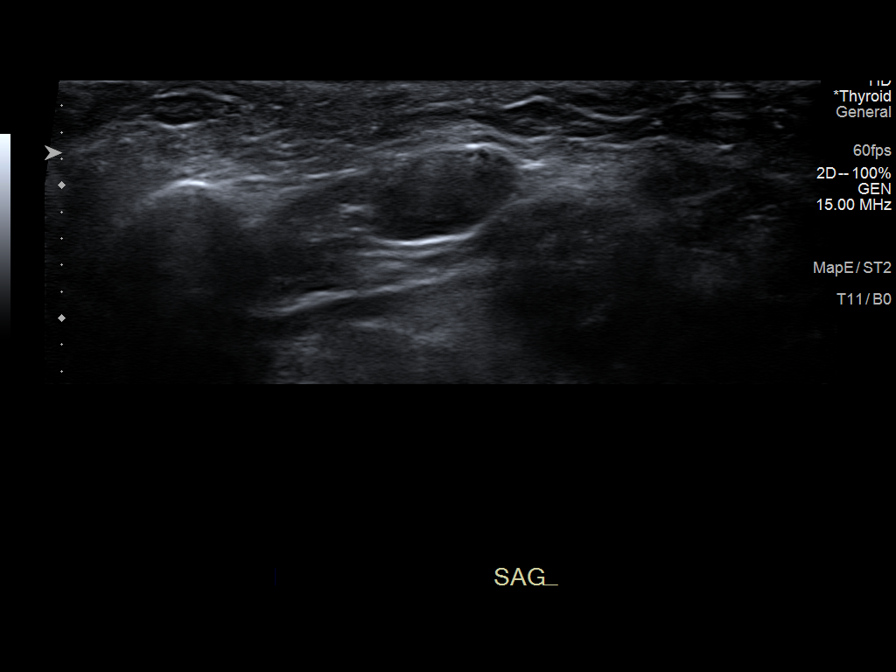
[im 5/8]
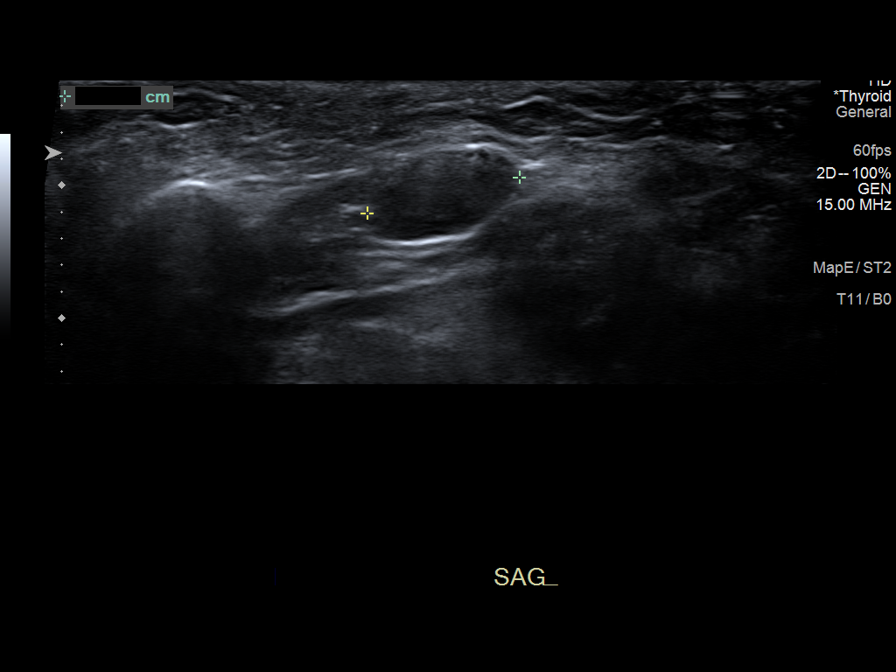
[im 6/8]
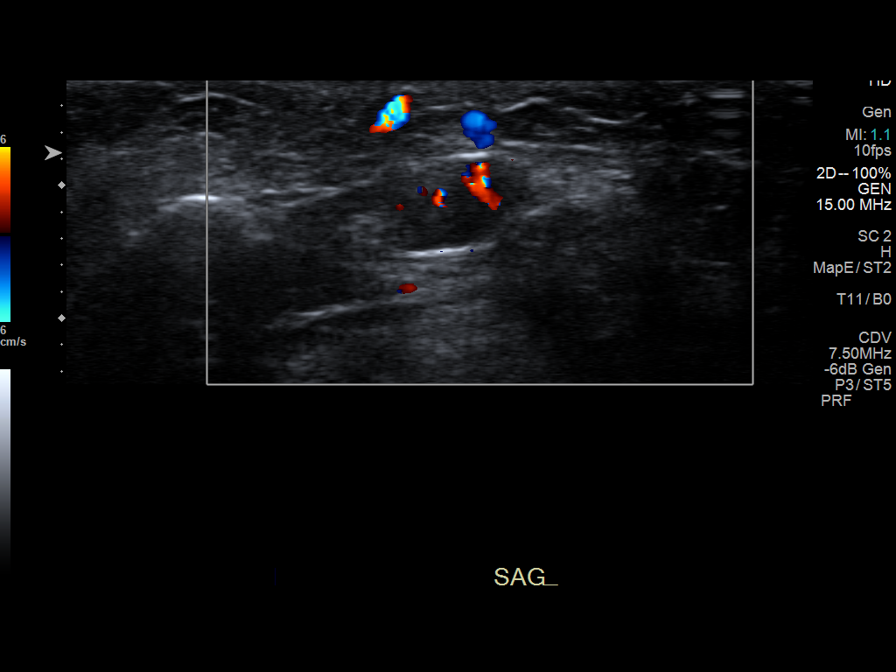
[im 7/8]
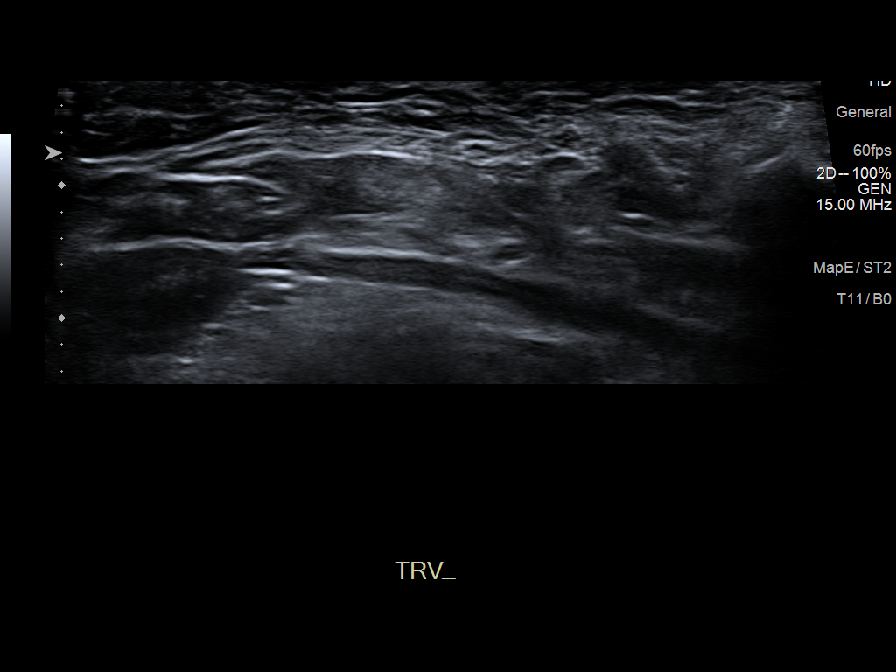
[im 8/8]
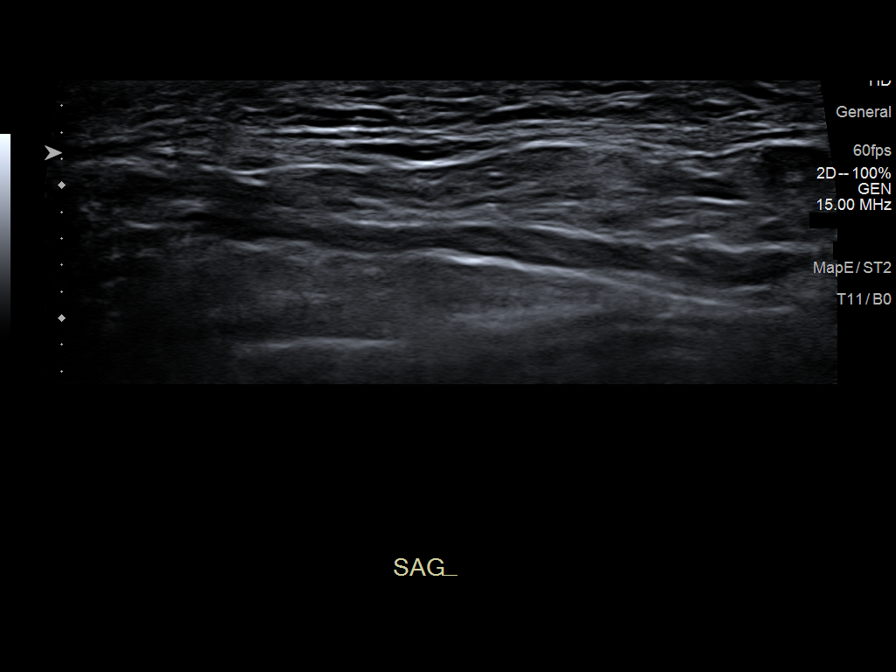

[8 of 8 positions shown; findings below may reference images not displayed]

FINDINGS: The patient's palpable area of concern corresponds to a 1.2 x 1.1 x
0.6 cm hypoechoic nodule. This nodule appears to demonstrate a fatty
hilum. This nodules located within the right submandibular region
within the subcutaneous soft tissues.
IMPRESSION: The patient's palpable area of concern most likely corresponds to a
small submandibular lymph node. In the absence of interval growth,
no further follow-up is recommended.

## 2019-08-02 ENCOUNTER — Other Ambulatory Visit: Payer: Self-pay | Admitting: Unknown Physician Specialty

## 2019-08-02 DIAGNOSIS — K118 Other diseases of salivary glands: Secondary | ICD-10-CM

## 2019-08-12 ENCOUNTER — Other Ambulatory Visit: Payer: Self-pay | Admitting: Family Medicine

## 2019-08-12 DIAGNOSIS — E559 Vitamin D deficiency, unspecified: Secondary | ICD-10-CM

## 2019-08-12 DIAGNOSIS — E782 Mixed hyperlipidemia: Secondary | ICD-10-CM

## 2019-08-12 DIAGNOSIS — N401 Enlarged prostate with lower urinary tract symptoms: Secondary | ICD-10-CM

## 2019-08-12 DIAGNOSIS — E1142 Type 2 diabetes mellitus with diabetic polyneuropathy: Secondary | ICD-10-CM

## 2019-08-13 ENCOUNTER — Ambulatory Visit (INDEPENDENT_AMBULATORY_CARE_PROVIDER_SITE_OTHER): Payer: Medicare Other

## 2019-08-13 ENCOUNTER — Other Ambulatory Visit (INDEPENDENT_AMBULATORY_CARE_PROVIDER_SITE_OTHER): Payer: Medicare Other

## 2019-08-13 ENCOUNTER — Other Ambulatory Visit: Payer: Medicare Other

## 2019-08-13 DIAGNOSIS — E559 Vitamin D deficiency, unspecified: Secondary | ICD-10-CM

## 2019-08-13 DIAGNOSIS — E1142 Type 2 diabetes mellitus with diabetic polyneuropathy: Secondary | ICD-10-CM | POA: Diagnosis not present

## 2019-08-13 DIAGNOSIS — Z0000 Encounter for general adult medical examination without abnormal findings: Secondary | ICD-10-CM | POA: Diagnosis not present

## 2019-08-13 DIAGNOSIS — N138 Other obstructive and reflux uropathy: Secondary | ICD-10-CM | POA: Diagnosis not present

## 2019-08-13 DIAGNOSIS — N401 Enlarged prostate with lower urinary tract symptoms: Secondary | ICD-10-CM

## 2019-08-13 DIAGNOSIS — E782 Mixed hyperlipidemia: Secondary | ICD-10-CM | POA: Diagnosis not present

## 2019-08-13 LAB — COMPREHENSIVE METABOLIC PANEL
ALT: 15 U/L (ref 0–53)
AST: 16 U/L (ref 0–37)
Albumin: 4.2 g/dL (ref 3.5–5.2)
Alkaline Phosphatase: 50 U/L (ref 39–117)
BUN: 18 mg/dL (ref 6–23)
CO2: 32 mEq/L (ref 19–32)
Calcium: 10.1 mg/dL (ref 8.4–10.5)
Chloride: 98 mEq/L (ref 96–112)
Creatinine, Ser: 0.97 mg/dL (ref 0.40–1.50)
GFR: 73.83 mL/min (ref >60.00)
Glucose, Bld: 147 mg/dL — ABNORMAL HIGH (ref 70–99)
Potassium: 3.7 mEq/L (ref 3.5–5.1)
Sodium: 139 mEq/L (ref 135–145)
Total Bilirubin: 0.7 mg/dL (ref 0.2–1.2)
Total Protein: 6.4 g/dL (ref 6.0–8.3)

## 2019-08-13 LAB — LIPID PANEL
Cholesterol: 149 mg/dL (ref 0–200)
HDL: 36.8 mg/dL — ABNORMAL LOW (ref >39.00)
NonHDL: 111.81
Total CHOL/HDL Ratio: 4
Triglycerides: 242 mg/dL — ABNORMAL HIGH (ref 0.0–149.0)
VLDL: 48.4 mg/dL — ABNORMAL HIGH (ref 0.0–40.0)

## 2019-08-13 LAB — LDL CHOLESTEROL, DIRECT: Direct LDL: 75 mg/dL

## 2019-08-13 LAB — VITAMIN D 25 HYDROXY (VIT D DEFICIENCY, FRACTURES): VITD: 31.12 ng/mL (ref 30.00–100.00)

## 2019-08-13 LAB — PSA: PSA: 0.04 ng/mL — ABNORMAL LOW (ref 0.10–4.00)

## 2019-08-13 LAB — HEMOGLOBIN A1C: Hgb A1c MFr Bld: 6.5 % (ref 4.6–6.5)

## 2019-08-13 NOTE — Progress Notes (Signed)
PCP notes:  Health Maintenance: Tdap- insurance/financial   Abnormal Screenings: none   Patient concerns: Discuss neuropathy Right hand arthritis Left hand shaking   Nurse concerns: none   Next PCP appt.: Today @ 10:30 am

## 2019-08-13 NOTE — Progress Notes (Signed)
Subjective:   Aaron Stafford is a 85 y.o. male who presents for Medicare Annual/Subsequent preventive examination.  Review of Systems: N/A     I connected with the patient today by telephone and verified that I am speaking with the correct person using two identifiers. Location patient: home Location nurse: work Persons participating in the virtual visit: patient, Marine scientist.   I discussed the limitations, risks, security and privacy concerns of performing an evaluation and management service by telephone and the availability of in person appointments. I also discussed with the patient that there may be a patient responsible charge related to this service. The patient expressed understanding and verbally consented to this telephonic visit.    Interactive audio and video telecommunications were attempted between this nurse and patient, however failed, due to patient having technical difficulties OR patient did not have access to video capability.  We continued and completed visit with audio only.     Cardiac Risk Factors include: advanced age (>36mn, >>63women);diabetes mellitus;hypertension;dyslipidemia;male gender     Objective:    Today's Vitals   08/13/19 0941  PainSc: 8    There is no height or weight on file to calculate BMI.  Advanced Directives 08/13/2019 08/07/2018 03/04/2017 12/26/2015  Does Patient Have a Medical Advance Directive? Yes Yes Yes Yes  Type of AParamed[place]of [place]os[place] will Living will;Healthcare Power of [place]an[place]Living will [place] will  Does patient want to make changes to medical advance directive? - No - Patient declined - No - Patient declined  Copy of [place] Chart? No - copy requested No - copy requested Yes No - copy requested    Current Medications (verified) Outpatient Encounter Medications as of 08/13/2019  Medication Sig  . Alpha-Lipoic Acid  600 MG CAPS Take 1 capsule (600 mg total) by mouth 2 (two) times daily.  .Marland KitchenamLODipine (NORVASC) 5 MG tablet TAKE 1 TABLET BY MOUTH EVERY DAY  . aspirin 81 MG tablet Take 81 mg by mouth daily.    . Blood Glucose Monitoring Suppl (OKathryn w/Device KIT 1 Device by Does not apply route daily. Test blood sugar once daily and as directed. Dx EY17.49 . Cholecalciferol (VITAMIN D3) 1000 UNITS CAPS Take 1 capsule (1,000 Units total) by mouth daily. (Patient taking differently: Take 1 capsule by mouth as needed. )  . Continuous Blood Gluc Receiver (FREESTYLE LIBRE 14 DAY READER) DEVI Use to check sugars daily and as needed E11.42  . Continuous Blood Gluc Sensor (FREESTYLE LIBRE 14 DAY SENSOR) MISC Use to check sugars daily and as needed E11.42  . Continuous Blood Gluc Sensor (FREESTYLE LIBRE SENSOR SYSTEM) MISC Use as directed to check sugars daily E11.42  . Cranberry 500 MG CAPS Take 2 capsules (1,000 mg total) by mouth daily.  . finasteride (PROSCAR) 5 MG tablet TAKE 1 TABLET BY MOUTH EVERY DAY  . fluticasone (FLONASE) 50 MCG/ACT nasal spray Place 2 sprays into both nostrils daily. (Patient taking differently: Place 2 sprays into both nostrils daily. As needed)  . furosemide (LASIX) 20 MG tablet TAKE 1 TABLET BY MOUTH EVERY DAY  . gabapentin (NEURONTIN) 300 MG capsule TAKE 2 CAPSULES BY MOUTH TWICE A DAY  . hydrochlorothiazide (HYDRODIURIL) 25 MG tablet TAKE 1 TABLET BY MOUTH EVERY DAY  . losartan (COZAAR) 25 MG tablet TAKE 1 TABLET BY MOUTH EVERY DAY  . metFORMIN (GLUCOPHAGE) 500 MG tablet TAKE 1 TABLET (500 MG  TOTAL) BY MOUTH 2 (TWO) TIMES DAILY WITH A MEAL.  . Multiple Vitamin (MULTIVITAMIN) tablet Take 1 tablet by mouth daily.  Glory Rosebush Delica Lancets 40N MISC CHECK BLOOD SUGAR ONCE DAILY AND AS INSTRUCTED. DX E11.42  . ONETOUCH VERIO test strip USE TO TEST BLOOD SUGAR ONCE DAILY  . pravastatin (PRAVACHOL) 40 MG tablet TAKE 1 TABLET BY MOUTH EVERYDAY AT BEDTIME  . traZODone  (DESYREL) 50 MG tablet TAKE 1 TO 1 AND 1/2 TABLETS BY MOUTH AT BEDTIME AS NEEDED FOR SLEEP  . valACYclovir (VALTREX) 1000 MG tablet Take 2 tablets (2,000 mg total) by mouth 2 (two) times daily. For 1 day  . vitamin B-12 (CYANOCOBALAMIN) 250 MCG tablet Take 250 mcg by mouth once a week.    No facility-administered encounter medications on file as of 08/13/2019.    Allergies (verified) Morphine sulfate, Nitroglycerin, and Contrast media [iodinated diagnostic agents]   History: Past Medical History:  Diagnosis Date  . CAD (coronary artery disease)    with stents  . Chronic rhinitis    ENT [place]  . Diabetes mellitus type II   . Diverticulosis 06/2011   by CT and colonoscopy  . Fatty liver 06/2011   by CT  . GERD (gastroesophageal reflux disease)   . History of BPH   . History of shingles   . HLD (hyperlipidemia)   . HTN (hypertension)   . Ischemic heart disease   . Melanoma ([place]) 12/2015   L forearm, scalp  . MI (myocardial infarction) ([place]) 1995  . Shingles rash 09/26/2012  . Sialadenitis 05/01/2017   Past Surgical History:  Procedure Laterality Date  . ANKLE SURGERY  03/2002   Left ankle fracture  . CARDIAC CATHETERIZATION    . CHOLECYSTECTOMY  1985  . COLONOSCOPY     diverticulosis, tortuous colon with looping [name])  . CORONARY ARTERY BYPASS GRAFT  05/1993   x6 MI Attemped angioplasty  . CORONARY STENT PLACEMENT  07/25/2009   Multi-Link VISION Cobalt chromium   . CYSTOSCOPY  10/03/08   Normal-Dr. Bernardo Heater  . INGUINAL HERNIA REPAIR  11/2000   and ventral hernia repair   Family History  Problem Relation Age of Onset  . Kidney failure Mother   . Coronary artery disease Mother   . Heart failure Mother   . Hypertension Brother   . Heart attack Brother   . Alcohol abuse Brother   . Hypertension Brother   . Hypertension Sister    Social History   Socioeconomic History  . Marital status: Married    Spouse name: Not on file  . Number of children: 5  . Years  of education: Not on file  . Highest education level: Not on file  Occupational History  . Occupation: Pastor  Tobacco Use  . Smoking status: Never Smoker  . Smokeless tobacco: Never Used  Vaping Use  . Vaping Use: Never used  Substance and Sexual Activity  . Alcohol use: No    Alcohol/week: 0.0 standard drinks  . Drug use: No  . Sexual activity: Not Currently  Other Topics Concern  . 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 Difficulty of Paying Living  Expenses: Not hard at all  Food Insecurity: No Food Insecurity  . Worried About Charity fundraiser in the Last Year: Never true  . Ran Out of Food in the Last Year: Never true  Transportation Needs: No Transportation Needs  . Lack of Transportation (Medical): No  . Lack of Transportation (Non-Medical): No  Physical Activity: Inactive  . Days of Exercise per Week: 0 days  . Minutes of Exercise per Session: 0 min  Stress: No Stress Concern Present  . Feeling of Stress : Not at all  Social Connections:   . Frequency of Communication with Friends and Family:   . Frequency of Social Gatherings with Friends and Family:   . Attends Religious Services:   . Active Member of Clubs or Organizations:   . Attends Archivist Meetings:   Marland Kitchen Marital Status:     Tobacco Counseling Counseling given: Not Answered   Clinical Intake:  Pre-visit preparation completed: Yes  Pain : 0-10 Pain Score: 8  Pain Type: Chronic pain Pain Location: Arm Pain Orientation: Right Pain Descriptors / Indicators: Aching Pain Onset: More than a month ago Pain Frequency: Intermittent     Nutritional Risks: None Diabetes: Yes CBG done?:  No Did pt. bring in CBG monitor from home?: No  How often do you need to have someone help you when you read instructions, pamphlets, or other written materials from your doctor or pharmacy?: 1 - Never What is the last grade level you completed in school?: college  Diabetic? Yes Nutrition Risk Assessment:  Has the patient had any N/V/D within the last 2 months?  No  Does the patient have any non-healing wounds?  No  Has the patient had any unintentional weight loss or weight gain?  No   Diabetes:  Is the patient diabetic?  Yes  If diabetic, was a CBG obtained today?  No  Did the patient bring in their glucometer from home?  No  How often do you monitor your CBG's? Does not check on a regular.   Financial Strains and Diabetes Management:  Are you having any financial strains with the device, your supplies or your medication? No .  Does the patient want to be seen by Chronic Care Management for management of their diabetes?  No  Would the patient like to be referred to a Nutritionist or for Diabetic Management?  No   Diabetic Exams:  Diabetic Eye Exam: Completed 12/10/2018 Diabetic Foot Exam: Completed 02/03/2019   Interpreter Needed?: No  Information entered by :: CJohnson, LPN   Activities of Daily Living In your present state of health, do you have any difficulty performing the following activities: 08/13/2019  Hearing? N  Vision? N  Difficulty concentrating or making decisions? N  Walking or climbing stairs? N  Dressing or bathing? N  Doing errands, shopping? N  Preparing Food and eating ? N  Using the Toilet? N  In the past six months, have you accidently leaked urine? N  Do you have problems with loss of bowel control? N  Managing your Medications? N  Managing your Finances? N  Housekeeping or managing your Housekeeping? N  Some recent data might be hidden    Patient Care Team: Ria Bush, MD as PCP - General (Family Medicine) Kem Parkinson, MD  as Consulting Physician (Ophthalmology) Rockey Situ Kathlene November, MD as Consulting Physician (Cardiology)  Indicate any recent Medical Services you may have received from other than [facility] providers in the past year (date may be approximate).  Assessment:   This is a routine wellness examination for [place].  Hearing/Vision screen  Hearing Screening   '125Hz'$  '250Hz'$  '500Hz'$  '1000Hz'$  '2000Hz'$  '3000Hz'$  '4000Hz'$  '6000Hz'$  '8000Hz'$   Right ear:           Left ear:           Vision Screening Comments: Patient gets annual eye exams   Dietary issues and exercise activities discussed: Current Exercise Habits: The patient does not participate in regular exercise at present, Exercise limited by: None identified  Goals    . Follow up with Primary Care Provider     Starting 08/10/18, I will continue to take medications as prescribed and to keep appointments with PCP as scheduled.     . Patient Stated     08/13/2019, I will maintain and continue medications as prescribed.       Depression Screen PHQ 2/9 Scores 08/13/2019 08/07/2018 03/04/2017 12/26/2015 12/14/2014 08/09/2013 03/25/2012  PHQ - 2 Score 0 0 0 0 0 0 0  PHQ- 9 Score 0 0 0 - - - -    Fall Risk Fall Risk  08/13/2019 08/07/2018 03/04/2017 12/26/2015 12/14/2014  Falls in the past year? 0 0 Yes No No  Comment - - fell off a chair and hit back of head; no medical treatment - -  Number falls in past yr: 0 - 1 - -  Injury with Fall? 0 - Yes - -  Risk for fall due to : Medication side effect - - - -  Follow up Falls evaluation completed;Falls prevention discussed - - - -    Any stairs in or around the home? Yes  If so, are there any without handrails? No  Home free of loose throw rugs in walkways, pet beds, electrical cords, etc? Yes  Adequate lighting in your home to reduce risk of falls? Yes   ASSISTIVE DEVICES UTILIZED TO PREVENT FALLS:  Life alert? No  Use of a cane, walker or w/c? No  Grab bars in the bathroom? No  Shower chair or bench in shower? No   Elevated toilet seat or a handicapped toilet? No   TIMED UP AND GO:  Was the test performed? N/A, telephonic visit .    Cognitive Function: MMSE - Mini Mental State Exam 08/13/2019 08/07/2018 03/04/2017 12/26/2015  Orientation to time '5 5 5 5  '$ Orientation to Place '5 5 5 5  '$ Registration '3 3 3 3  '$ Attention/ Calculation 4 0 0 0  Recall '3 3 3 3  '$ Language- name 2 objects - 0 0 0  Language- repeat '1 1 1 1  '$ Language- follow 3 step command - 0 3 3  Language- read & follow direction - 0 0 0  Write a sentence - 0 0 0  Copy design - 0 0 0  Total score - '17 20 20  '$ Mini Cog  Mini-Cog screen was completed. Maximum score is 22. A value of 0 denotes this part of the MMSE was not completed or the patient failed this part of the Mini-Cog screening.       Immunizations Immunization History  Administered Date(s) Administered  . Influenza Split 11/21/2011  . Influenza Whole 11/19/2003, 03/01/2009  . Influenza, High Dose Seasonal PF 10/03/2016, 10/27/2017, 12/03/2018  . Influenza,inj,Quad PF,6+ Mos 11/23/2012, 02/15/2014, 12/14/2014, 12/21/2015  . PFIZER SARS-COV-2 Vaccination 02/23/2019, 03/16/2019  . Pneumococcal Conjugate-13 08/09/2013  . Pneumococcal Polysaccharide-23 05/10/2004  . Td 12/26/1997, 04/12/2009  . Zoster 11/15/2010    TDAP status: Due, Education has been provided  regarding the importance of this vaccine. Advised may receive this vaccine at local pharmacy or Health Dept. Aware to provide a copy of the vaccination record if obtained from local pharmacy or Health Dept. Verbalized acceptance and understanding. Flu Vaccine status: Up to date Pneumococcal vaccine status: Up to date Covid-19 vaccine status: Completed vaccines  Qualifies for Shingles Vaccine? Yes   Zostavax completed Yes   Shingrix Completed?: No.    Education has been provided regarding the importance of this vaccine. Patient has been advised to call insurance company to determine out of pocket expense if they have  not yet received this vaccine. Advised may also receive vaccine at local pharmacy or Health Dept. Verbalized acceptance and understanding.  Screening Tests Health Maintenance  Topic Date Due  . DTAP VACCINES (1) 04/20/1934  . DTaP/Tdap/Td (3 - Tdap) 04/13/2019  . HEMOGLOBIN A1C  08/04/2019  . TETANUS/TDAP  08/13/2023 (Originally 04/13/2019)  . INFLUENZA VACCINE  09/19/2019  . OPHTHALMOLOGY EXAM  12/10/2019  . FOOT EXAM  02/03/2020  . COVID-19 Vaccine  Completed  . PNA vac Low Risk Adult  Completed    Health Maintenance  Health Maintenance Due  Topic Date Due  . DTAP VACCINES (1) 04/20/1934  . DTaP/Tdap/Td (3 - Tdap) 04/13/2019  . HEMOGLOBIN A1C  08/04/2019    Colorectal cancer screening: No longer required.   Lung Cancer Screening: (Low Dose CT Chest recommended if Age 52-80 years, 30 pack-year currently smoking OR have quit w/in 15years.) does not qualify.     Additional Screening:  Hepatitis C Screening: does not qualify; Completed N/A  Vision Screening: Recommended annual ophthalmology exams for early detection of glaucoma and other disorders of the eye. Is the patient up to date with their annual eye exam?  Yes  Who is the provider or what is the name of the office in which the patient attends annual eye exams? [facility]  If pt is not established with a provider, would they like to be referred to a provider to establish care? No .   Dental Screening: Recommended annual dental exams for proper oral hygiene  Community Resource Referral / Chronic Care Management: CRR required this visit?  No   CCM required this visit?  No      Plan:     I have personally reviewed and noted the following in the patient's chart:   . Medical and social history . Use of alcohol, tobacco or illicit drugs  . Current medications and supplements . Functional ability and status . Nutritional status . Physical activity . Advanced directives . List of other  physicians . Hospitalizations, surgeries, and ER visits in previous 12 months . Vitals . Screenings to include cognitive, depression, and falls . Referrals and appointments  In addition, I have reviewed and discussed with patient certain preventive protocols, quality metrics, and best practice recommendations. A written personalized care plan for preventive services as well as general preventive health recommendations were provided to patient.   Due to this being a telephonic visit, the after visit summary with patients personalized plan was offered to patient via mail or my-chart. Patient preferred to pick up at office at next visit.   Andrez Grime, LPN   7/67/2094

## 2019-08-13 NOTE — Patient Instructions (Signed)
[name] , Thank you for taking time to come for your Medicare Wellness Visit. I appreciate your ongoing commitment to your health goals. Please review the following plan we discussed and let me know if I can assist you in the future.   Screening recommendations/referrals: Colonoscopy: no longer required Recommended yearly ophthalmology/optometry visit for glaucoma screening and checkup Recommended yearly dental visit for hygiene and checkup  Vaccinations: Influenza vaccine: Up to date, completed 12/03/2018, due 11/2028 Pneumococcal vaccine: Completed series Tdap vaccine: due, check with pharmacy and health dept Shingles vaccine: due, check with insurance for coverage   Covid-19: Completed series   Advanced directives: Please bring a copy of your POA (Power of Attorney) and/or Living Will to your next appointment.  Conditions/risks identified: diabetes, hypertension, hyperlipidemia  Next appointment: Follow up in one year for your annual wellness visit.   Preventive Care 13 Years and Older, Male Preventive care refers to lifestyle choices and visits with your health care provider that can promote health and wellness. What does preventive care include?  A yearly physical exam. This is also called an annual well check.  Dental exams once or twice a year.  Routine eye exams. Ask your health care provider how often you should have your eyes checked.  Personal lifestyle choices, including:  Daily care of your teeth and gums.  Regular physical activity.  Eating a healthy diet.  Avoiding tobacco and drug use.  Limiting alcohol use.  Practicing safe sex.  Taking low doses of aspirin every day.  Taking vitamin and mineral supplements as recommended by your health care provider. What happens during an annual well check? The services and screenings done by your health care provider during your annual well check will depend on your age, overall health, lifestyle risk factors,  and family history of disease. Counseling  Your health care provider may ask you questions about your:  Alcohol use.  Tobacco use.  Drug use.  Emotional well-being.  Home and relationship well-being.  Sexual activity.  Eating habits.  History of falls.  Memory and ability to understand (cognition).  Work and work Statistician. Screening  You may have the following tests or measurements:  Height, weight, and BMI.  Blood pressure.  Lipid and cholesterol levels. These may be checked every 5 years, or more frequently if you are over 67 years old.  Skin check.  Lung cancer screening. You may have this screening every year starting at age 50 if you have a 30-pack-year history of smoking and currently smoke or have quit within the past 15 years.  Fecal occult blood test (FOBT) of the stool. You may have this test every year starting at age 77.  Flexible sigmoidoscopy or colonoscopy. You may have a sigmoidoscopy every 5 years or a colonoscopy every 10 years starting at age 49.  Prostate cancer screening. Recommendations will vary depending on your family history and other risks.  Hepatitis C blood test.  Hepatitis B blood test.  Sexually transmitted disease (STD) testing.  Diabetes screening. This is done by checking your blood sugar (glucose) after you have not eaten for a while (fasting). You may have this done every 1-3 years.  Abdominal aortic aneurysm (AAA) screening. You may need this if you are a current or former smoker.  Osteoporosis. You may be screened starting at age 69 if you are at high risk. Talk with your health care provider about your test results, treatment options, and if necessary, the need for more tests. Vaccines  Your health  care provider may recommend certain vaccines, such as:  Influenza vaccine. This is recommended every year.  Tetanus, diphtheria, and acellular pertussis (Tdap, Td) vaccine. You may need a Td booster every 10  years.  Zoster vaccine. You may need this after age 64.  Pneumococcal 13-valent conjugate (PCV13) vaccine. One dose is recommended after age 46.  Pneumococcal polysaccharide (PPSV23) vaccine. One dose is recommended after age 76. Talk to your health care provider about which screenings and vaccines you need and how often you need them. This information is not intended to replace advice given to you by your health care provider. Make sure you discuss any questions you have with your health care provider. Document Released: 03/03/2015 Document Revised: 10/25/2015 Document Reviewed: 12/06/2014 Elsevier Interactive Patient Education  2017 [place] Prevention in the Home Falls can cause injuries. They can happen to people of all ages. There are many things you can do to make your home safe and to help prevent falls. What can I do on the outside of my home?  Regularly fix the edges of walkways and driveways and fix any cracks.  Remove anything that might make you trip as you walk through a door, such as a raised step or threshold.  Trim any bushes or trees on the path to your home.  Use bright outdoor lighting.  Clear any walking paths of anything that might make someone trip, such as rocks or tools.  Regularly check to see if handrails are loose or broken. Make sure that both sides of any steps have handrails.  Any raised decks and porches should have guardrails on the edges.  Have any leaves, snow, or ice cleared regularly.  Use sand or salt on walking paths during winter.  Clean up any spills in your garage right away. This includes oil or grease spills. What can I do in the bathroom?  Use night lights.  Install grab bars by the toilet and in the tub and shower. Do not use towel bars as grab bars.  Use non-skid mats or decals in the tub or shower.  If you need to sit down in the shower, use a plastic, non-slip stool.  Keep the floor dry. Clean up any water that  spills on the floor as soon as it happens.  Remove soap buildup in the tub or shower regularly.  Attach bath mats securely with double-sided non-slip rug tape.  Do not have throw rugs and other things on the floor that can make you trip. What can I do in the bedroom?  Use night lights.  Make sure that you have a light by your bed that is easy to reach.  Do not use any sheets or blankets that are too big for your bed. They should not hang down onto the floor.  Have a firm chair that has side arms. You can use this for support while you get dressed.  Do not have throw rugs and other things on the floor that can make you trip. What can I do in the kitchen?  Clean up any spills right away.  Avoid walking on wet floors.  Keep items that you use a lot in easy-to-reach places.  If you need to reach something above you, use a strong step stool that has a grab bar.  Keep electrical cords out of the way.  Do not use floor polish or wax that makes floors slippery. If you must use wax, use non-skid floor wax.  Do not have  throw rugs and other things on the floor that can make you trip. What can I do with my stairs?  Do not leave any items on the stairs.  Make sure that there are handrails on both sides of the stairs and use them. Fix handrails that are broken or loose. Make sure that handrails are as long as the stairways.  Check any carpeting to make sure that it is firmly attached to the stairs. Fix any carpet that is loose or worn.  Avoid having throw rugs at the top or bottom of the stairs. If you do have throw rugs, attach them to the floor with carpet tape.  Make sure that you have a light switch at the top of the stairs and the bottom of the stairs. If you do not have them, ask someone to add them for you. What else can I do to help prevent falls?  Wear shoes that:  Do not have high heels.  Have rubber bottoms.  Are comfortable and fit you well.  Are closed at the  toe. Do not wear sandals.  If you use a stepladder:  Make sure that it is fully opened. Do not climb a closed stepladder.  Make sure that both sides of the stepladder are locked into place.  Ask someone to hold it for you, if possible.  Clearly mark and make sure that you can see:  Any grab bars or handrails.  First and last steps.  Where the edge of each step is.  Use tools that help you move around (mobility aids) if they are needed. These include:  Canes.  Walkers.  Scooters.  Crutches.  Turn on the lights when you go into a dark area. Replace any light bulbs as soon as they burn out.  Set up your furniture so you have a clear path. Avoid moving your furniture around.  If any of your floors are uneven, fix them.  If there are any pets around you, be aware of where they are.  Review your medicines with your doctor. Some medicines can make you feel dizzy. This can increase your chance of falling. Ask your doctor what other things that you can do to help prevent falls. This information is not intended to replace advice given to you by your health care provider. Make sure you discuss any questions you have with your health care provider. Document Released: 12/01/2008 Document Revised: 07/13/2015 Document Reviewed: 03/11/2014 Elsevier Interactive Patient Education  2017 [employer].

## 2019-08-18 ENCOUNTER — Ambulatory Visit (INDEPENDENT_AMBULATORY_CARE_PROVIDER_SITE_OTHER)
Admission: RE | Admit: 2019-08-18 | Discharge: 2019-08-18 | Disposition: A | Discharge status: Home / Self Care | Payer: Medicare Other | Source: Ambulatory Visit | Attending: Family Medicine | Admitting: Family Medicine

## 2019-08-18 ENCOUNTER — Other Ambulatory Visit: Payer: Self-pay

## 2019-08-18 ENCOUNTER — Encounter: Payer: Self-pay | Admitting: Family Medicine

## 2019-08-18 ENCOUNTER — Ambulatory Visit (INDEPENDENT_AMBULATORY_CARE_PROVIDER_SITE_OTHER): Payer: Medicare Other | Admitting: Family Medicine

## 2019-08-18 VITALS — BP 134/64 | HR 63 | Temp 97.6°F | Ht 65.75 in | Wt 176.4 lb

## 2019-08-18 DIAGNOSIS — E785 Hyperlipidemia, unspecified: Secondary | ICD-10-CM

## 2019-08-18 DIAGNOSIS — R251 Tremor, unspecified: Secondary | ICD-10-CM

## 2019-08-18 DIAGNOSIS — E1142 Type 2 diabetes mellitus with diabetic polyneuropathy: Secondary | ICD-10-CM

## 2019-08-18 DIAGNOSIS — E663 Overweight: Secondary | ICD-10-CM

## 2019-08-18 DIAGNOSIS — G6289 Other specified polyneuropathies: Secondary | ICD-10-CM | POA: Diagnosis not present

## 2019-08-18 DIAGNOSIS — Z951 Presence of aortocoronary bypass graft: Secondary | ICD-10-CM

## 2019-08-18 DIAGNOSIS — N138 Other obstructive and reflux uropathy: Secondary | ICD-10-CM

## 2019-08-18 DIAGNOSIS — N401 Enlarged prostate with lower urinary tract symptoms: Secondary | ICD-10-CM | POA: Diagnosis not present

## 2019-08-18 DIAGNOSIS — M79641 Pain in right hand: Secondary | ICD-10-CM

## 2019-08-18 DIAGNOSIS — E559 Vitamin D deficiency, unspecified: Secondary | ICD-10-CM

## 2019-08-18 DIAGNOSIS — I10 Essential (primary) hypertension: Secondary | ICD-10-CM

## 2019-08-18 DIAGNOSIS — I25118 Atherosclerotic heart disease of native coronary artery with other forms of angina pectoris: Secondary | ICD-10-CM

## 2019-08-18 DIAGNOSIS — E1169 Type 2 diabetes mellitus with other specified complication: Secondary | ICD-10-CM

## 2019-08-18 IMAGING — DX DG HAND COMPLETE 3+V*R*
3 series · 3 of 3 positions shown · non-contrast
Comparison: None.

CLINICAL DATA: Hand pain over the last month.

EXAM:
RIGHT HAND - COMPLETE 3+ VIEW

[hand ap]
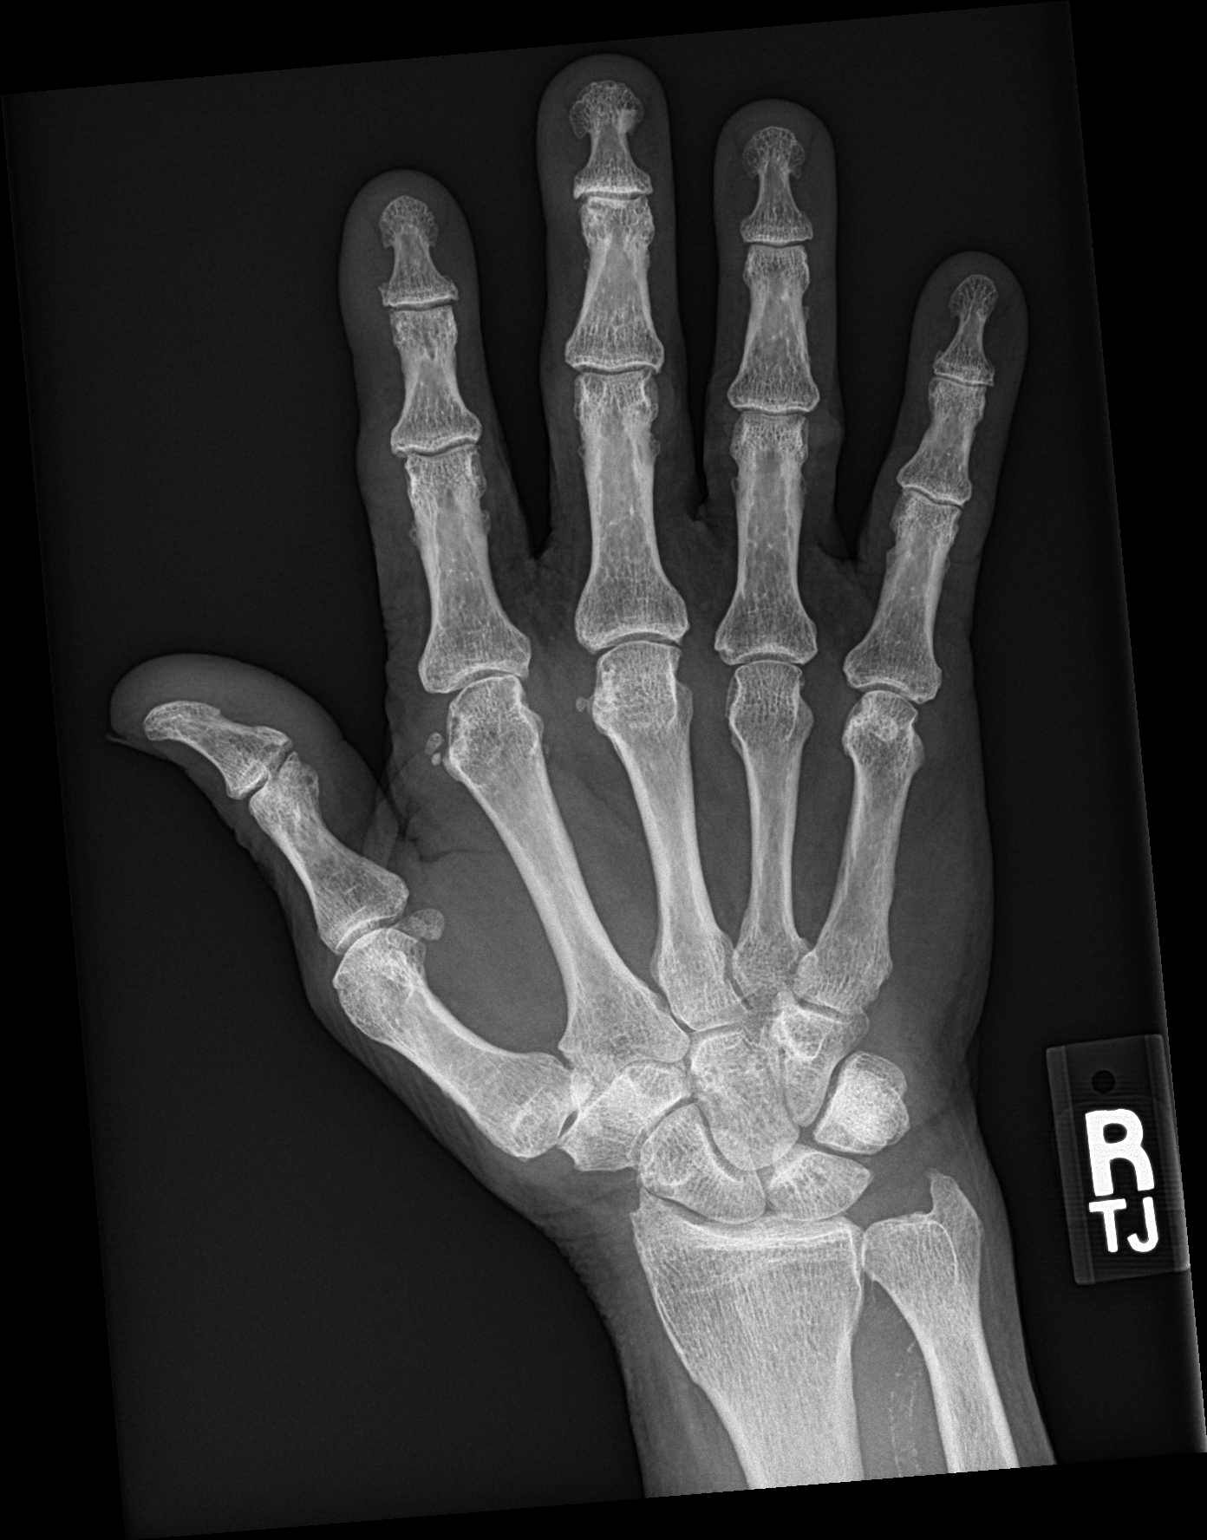

[hand obl]
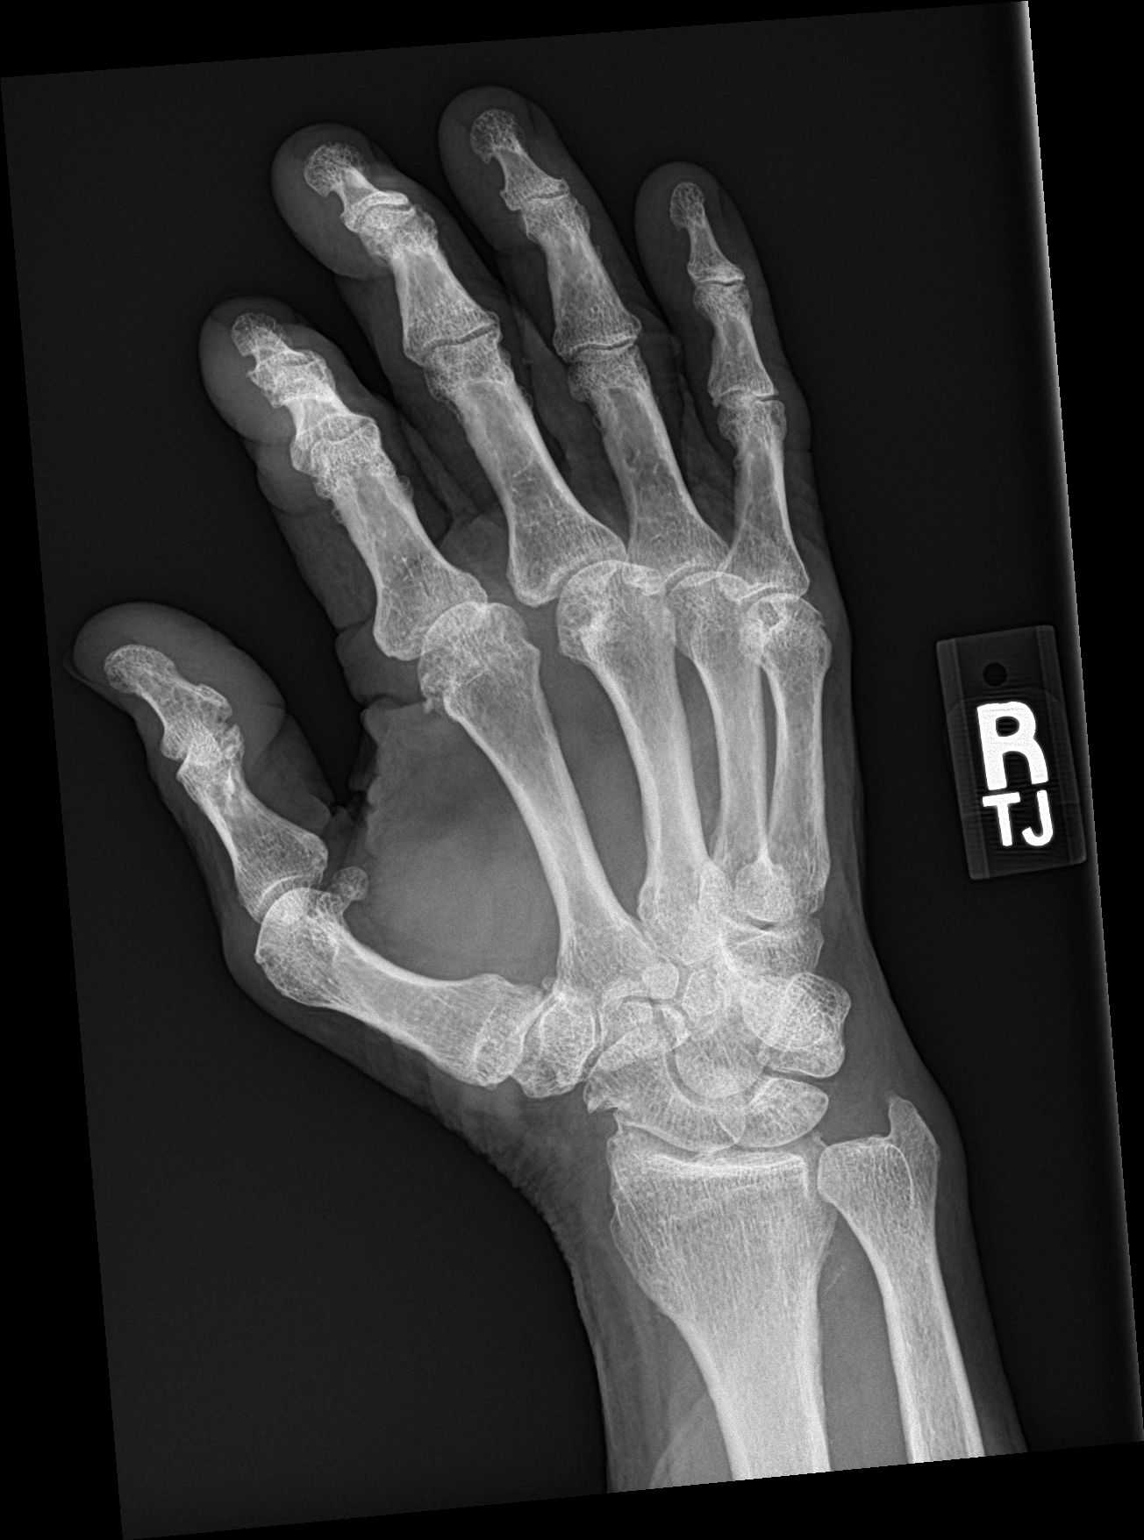

[hand lat]
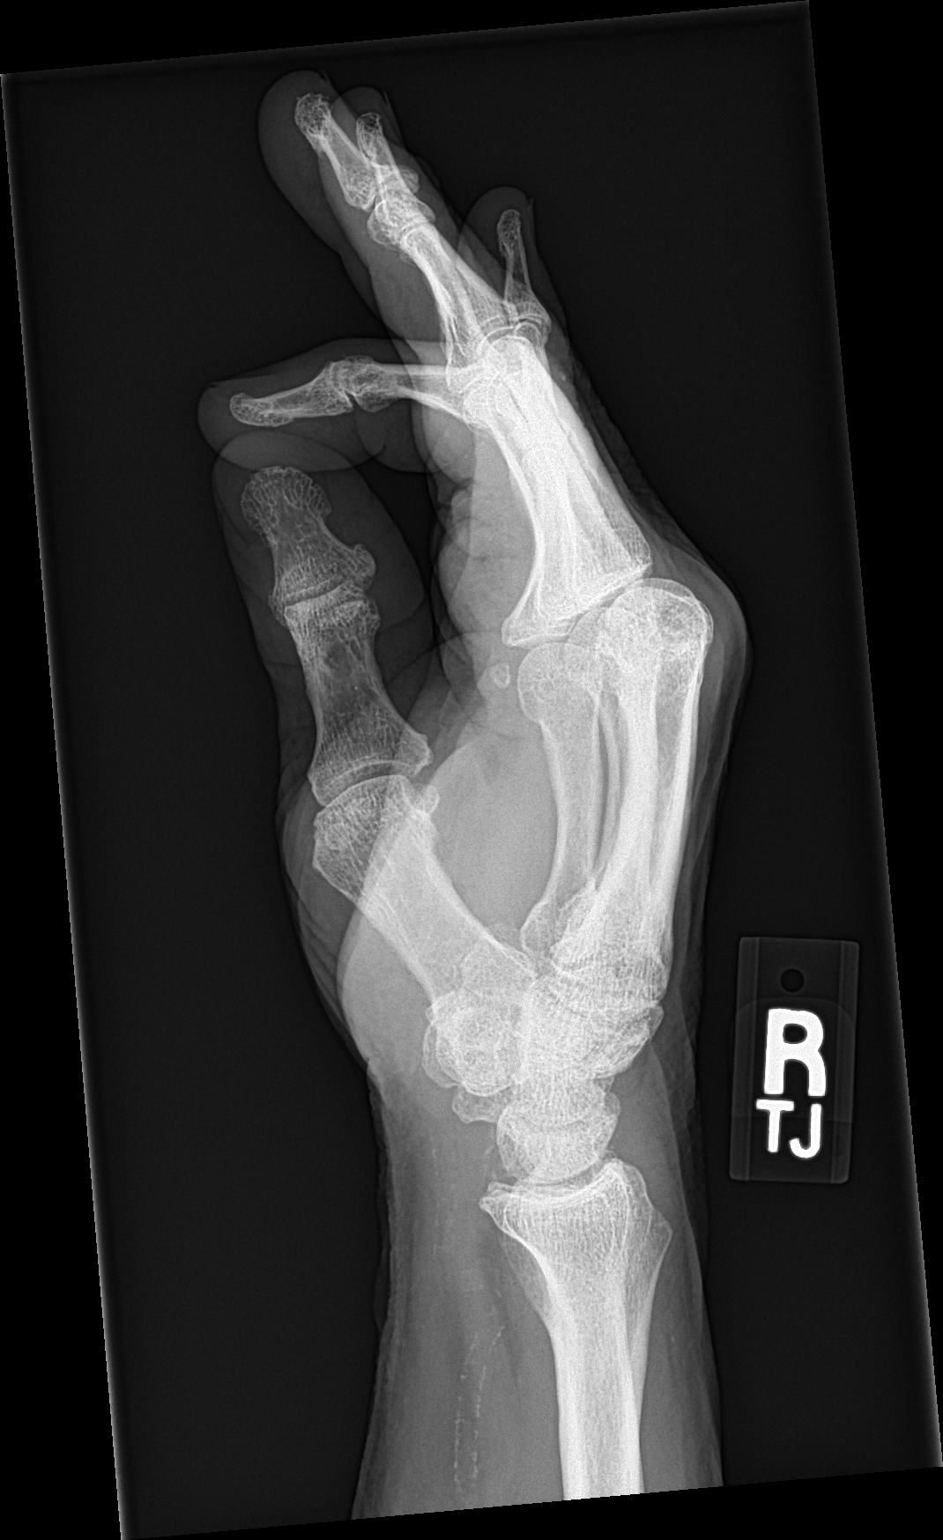

[3 of 3 positions shown; findings below may reference images not displayed]

FINDINGS: No evidence of fracture or focal bone lesion. Mild osteoarthritis of
the first carpometacarpal joint and of the radiocarpal joint. Mild
osteoarthritis of the metacarpal phalangeal joints and inter
phalangeal joints. No single joint appears more advanced. No sign of
erosive arthritis.
IMPRESSION: Osteo arthritic changes of the radial side carpus, the metacarpal
phalangeal joints and inter phalangeal joints. No sign of erosive
arthritis. No single advanced joint involvement.

## 2019-08-18 MED ORDER — DICLOFENAC SODIUM 1 % EX GEL
2.0000 g | Freq: Three times a day (TID) | CUTANEOUS | Status: DC
Start: 2019-08-18 — End: 2021-06-13

## 2019-08-18 NOTE — Progress Notes (Signed)
This visit was conducted in person.  BP 134/64 (BP Location: Left Arm, Patient Position: Sitting, Cuff Size: Normal)   Pulse 63   Temp 97.6 F (36.4 C) (Temporal)   Ht 5' 5.75" (1.67 m)   Wt 176 lb 6 oz (80 kg)   SpO2 97%   BMI 28.68 kg/m    CC: AMW f/u visit  Subjective:    Patient ID: Aaron Stafford, male    DOB: 1935-01-27, 85 y.o.   MRN: [id number]  HPI: Aaron Stafford is a 85 y.o. male presenting on 08/18/2019 for Annual Exam (Prt 2.  Wants to discuss hand tremor worsening. )   Saw health advisor last week for medicare wellness visit. Note reviewed.  Takes trip q2 yrs to [place]  Since 1972.   No exam data present    Clinical Support from 08/13/2019 in [place] [facility] Total Score 0      Fall Risk  08/13/2019 08/07/2018 03/04/2017 12/26/2015 12/14/2014  Falls in the past year? 0 0 Yes No No  Comment - - fell off a chair and hit back of head; no medical treatment - -  Number falls in past yr: 0 - 1 - -  Injury with Fall? 0 - Yes - -  Risk for fall due to : Medication side effect - - - -  Follow up Falls evaluation completed;Falls prevention discussed - - - -    Progressive L hand tremor over the past 14 months. More noticeable when holding something ie a pen. Denies stiffness or anosmia. Right handed.   Son Konrad Dolores doing well (HNL).   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 No triggering. No redness or warmth of hand. No prior hand xrays. Denies inciting trauma/injury. Notes worsening over the past 1 month.   Recent neck ultrasound likely benign small submandibular lymph node - will monitor.   Preventative: COLONOSCOPY Date: 03/2005 diverticulosis, tortuous colon with looping [name]).Cologuard negative 2017. Denies bowel changes or blood in stool.  Prostate - h/o BPH, takes finasteride. Nocturia x1, no urinary trouble. No h/o prostate cancer.Stopped DRE, intermittent  PSA. Flu shot yearly  Pneumovax 2006. prevnar - 07/2013 Td2011 zostavax 2012 shingrix - discussed COVID vaccine - completed [facility] 02/2019 Advanced planning - scanned into chart 06/2014. HCPOA is wife [name] then granddaughter [name]. Ok with tube feeds. Does not want prolonged life support.  Seat belt use discussed.  Sunscreen use discussed.H/o melanoma removed from scalp. Sees derm regularly (Dr Phillip Heal).  Non smoker Alcohol - none  Dentist - hasn't seen recently - no dental insurance. Flosses and brushes teeth regularly.  Eye exam yearly  Bowels - notes increasing constipation, treating with metamucil. 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 Interim medical history since our last visit reviewed. Allergies and medications reviewed and updated. Outpatient Medications Prior to Visit  Medication Sig Dispense Refill  . Alpha-Lipoic Acid 600 MG CAPS Take 1 capsule (600 mg total) by mouth 2 (two) times daily.    Marland Kitchen amLODipine (NORVASC) 5 MG tablet TAKE 1 TABLET BY MOUTH EVERY DAY 90 tablet 3  . aspirin 81 MG tablet  Take 81 mg by mouth daily.      . Blood Glucose Monitoring Suppl (ONETOUCH VERIO FLEX SYSTEM) w/Device KIT 1 Device by Does not apply route daily. Test blood sugar once daily and as directed. Dx E11.42 1 kit 0  . Cholecalciferol (VITAMIN D3) 1000 UNITS CAPS Take 1 capsule (1,000 Units total) by mouth daily. (Patient taking differently: Take 1 capsule by mouth as needed. ) 30 capsule   . Cranberry 500 MG CAPS Take 2 capsules (1,000 mg total) by mouth daily.    . finasteride (PROSCAR) 5 MG tablet TAKE 1 TABLET BY MOUTH EVERY DAY 90 tablet 0  . fluticasone (FLONASE) 50 MCG/ACT nasal  spray Place 2 sprays into both nostrils daily. (Patient taking differently: Place 2 sprays into both nostrils daily. As needed) 16 g 0  . furosemide (LASIX) 20 MG tablet TAKE 1 TABLET BY MOUTH EVERY DAY 90 tablet 1  . gabapentin (NEURONTIN) 300 MG capsule TAKE 2 CAPSULES BY MOUTH TWICE A DAY 360 capsule 1  . hydrochlorothiazide (HYDRODIURIL) 25 MG tablet TAKE 1 TABLET BY MOUTH EVERY DAY 90 tablet 3  . losartan (COZAAR) 25 MG tablet TAKE 1 TABLET BY MOUTH EVERY DAY 90 tablet 3  . metFORMIN (GLUCOPHAGE) 500 MG tablet TAKE 1 TABLET (500 MG TOTAL) BY MOUTH 2 (TWO) TIMES DAILY WITH A MEAL. 180 tablet 1  . Multiple Vitamin (MULTIVITAMIN) tablet Take 1 tablet by mouth daily.    Letta Pate Delica Lancets 30G MISC CHECK BLOOD SUGAR ONCE DAILY AND AS INSTRUCTED. DX E11.42 100 each 3  . ONETOUCH VERIO test strip USE TO TEST BLOOD SUGAR ONCE DAILY 100 each 1  . pravastatin (PRAVACHOL) 40 MG tablet TAKE 1 TABLET BY MOUTH EVERYDAY AT BEDTIME 90 tablet 3  . traZODone (DESYREL) 50 MG tablet TAKE 1 TO 1 AND 1/2 TABLETS BY MOUTH AT BEDTIME AS NEEDED FOR SLEEP 90 tablet 1  . valACYclovir (VALTREX) 1000 MG tablet Take 2 tablets (2,000 mg total) by mouth 2 (two) times daily. For 1 day 4 tablet 3  . vitamin B-12 (CYANOCOBALAMIN) 250 MCG tablet Take 250 mcg by mouth once a week.     . Continuous Blood Gluc Receiver (FREESTYLE LIBRE 14 DAY READER) DEVI Use to check sugars daily and as needed E11.42 2 each 6  . Continuous Blood Gluc Sensor (FREESTYLE LIBRE 14 DAY SENSOR) MISC Use to check sugars daily and as needed E11.42 2 each 6  . Continuous Blood Gluc Sensor (FREESTYLE LIBRE SENSOR SYSTEM) MISC Use as directed to check sugars daily E11.42 1 each 0   No facility-administered medications prior to visit.     Per HPI unless specifically indicated in ROS section below Review of Systems Objective:  BP 134/64 (BP Location: Left Arm, Patient Position: Sitting, Cuff Size: Normal)   Pulse 63   Temp 97.6 F (36.4 C)  (Temporal)   Ht 5' 5.75" (1.67 m)   Wt 176 lb 6 oz (80 kg)   SpO2 97%   BMI 28.68 kg/m   Wt Readings from Last 3 Encounters:  08/18/19 176 lb 6 oz (80 kg)  05/05/19 180 lb 6 oz (81.8 kg)  02/03/19 177 lb (80.3 kg)      Physical Exam Vitals and nursing note reviewed.  Constitutional:      General: He is not in acute distress.    Appearance: Normal appearance. He is well-developed. He is not ill-appearing.  HENT:     Head: Normocephalic and atraumatic.     Right  Ear: Hearing, tympanic membrane, ear canal and external ear normal.     Left Ear: Hearing, tympanic membrane, ear canal and external ear normal.  Eyes:     General: No scleral icterus.    Extraocular Movements: Extraocular movements intact.     Conjunctiva/sclera: Conjunctivae normal.     Pupils: Pupils are equal, round, and reactive to light.  Neck:     Thyroid: No thyromegaly or thyroid tenderness.     Vascular: No carotid bruit.  Cardiovascular:     Rate and Rhythm: Normal rate and regular rhythm.     Pulses: Normal pulses.          Radial pulses are 2+ on the right side and 2+ on the left side.     Heart sounds: Normal heart sounds. No murmur heard.   Pulmonary:     Effort: Pulmonary effort is normal. No respiratory distress.     Breath sounds: Normal breath sounds. No wheezing, rhonchi or rales.  Abdominal:     General: Abdomen is flat. Bowel sounds are normal. There is no distension.     Palpations: Abdomen is soft. There is no mass.     Tenderness: There is no abdominal tenderness. There is no guarding or rebound.     Hernia: No hernia is present.  Musculoskeletal:        General: Normal range of motion.     Cervical back: Normal range of motion and neck supple.     Right lower leg: No edema.     Left lower leg: No edema.     Comments:  2+ rad pulses bilaterally No active synovitis R hand tender to palpation with mild swelling at 3rd MCP  Lymphadenopathy:     Cervical: No cervical adenopathy.   Skin:    General: Skin is warm and dry.     Findings: No rash.  Neurological:     General: No focal deficit present.     Mental Status: He is alert and oriented to person, place, and time.     Motor: Tremor (mild left handed action tremor) present.     Comments: CN grossly intact, station and gait intact  Psychiatric:        Mood and Affect: Mood normal.        Behavior: Behavior normal.        Thought Content: Thought content normal.        Judgment: Judgment normal.       Results for orders placed or performed in visit on 08/13/19  VITAMIN D 25 Hydroxy (Vit-D Deficiency, Fractures)  Result Value Ref Range   VITD 31.12 30.00 - 100.00 ng/mL  PSA  Result Value Ref Range   PSA 0.04 (L) 0.10 - 4.00 ng/mL  Hemoglobin A1c  Result Value Ref Range   Hgb A1c MFr Bld 6.5 4.6 - 6.5 %  Comprehensive metabolic panel  Result Value Ref Range   Sodium 139 135 - 145 mEq/L   Potassium 3.7 3.5 - 5.1 mEq/L   Chloride 98 96 - 112 mEq/L   CO2 32 19 - 32 mEq/L   Glucose, Bld 147 (H) 70 - 99 mg/dL   BUN 18 6 - 23 mg/dL   Creatinine, Ser 0.17 0.40 - 1.50 mg/dL   Total Bilirubin 0.7 0.2 - 1.2 mg/dL   Alkaline Phosphatase 50 39 - 117 U/L   AST 16 0 - 37 U/L   ALT 15 0 - 53 U/L   Total Protein 6.4 6.0 - 8.3  g/dL   Albumin 4.2 3.5 - 5.2 g/dL   GFR 73.83 >60.00 mL/min   Calcium 10.1 8.4 - 10.5 mg/dL  Lipid panel  Result Value Ref Range   Cholesterol 149 0 - 200 mg/dL   Triglycerides 242.0 (H) 0 - 149 mg/dL   HDL 36.80 (L) >39.00 mg/dL   VLDL 48.4 (H) 0.0 - 40.0 mg/dL   Total CHOL/HDL Ratio 4    NonHDL 111.81   LDL cholesterol, direct  Result Value Ref Range   Direct LDL 75.0 mg/dL   Assessment & Plan:  This visit occurred during the SARS-CoV-2 public health emergency.  Safety protocols were in place, including screening questions prior to the visit, additional usage of staff PPE, and extensive cleaning of exam room while observing appropriate contact time as indicated for disinfecting  solutions.   Problem List Items Addressed This Visit    Vitamin D deficiency    Continue vit D 1000 IU daily.       Type 2 diabetes, controlled, with peripheral neuropathy (HCC)    Chronic, stable period on metformin '500mg'$  bid.       Tremor    Anticipate essential tremor.  No signs of parkinsonism.  Continue gabapentin.      S/P CABG (coronary artery bypass graft)    Continue aspirin, statin.       Right hand pain - Primary    New anticipate OA related - check hand xrays today. rec voltaren gel OTC PRN pain.       Relevant Orders   DG Hand Complete Right   Peripheral neuropathy    Ongoing. Managed with '600mg'$  gabapentin at 6pm and again at 8pm. Also continues alpha lipoic acid.       Overweight with body mass index (BMI) 25.0-29.9    Weight very stable. Goal weight has been 175 lbs.       Hyperlipidemia associated with type 2 diabetes mellitus (HCC)    Chronic, LDL ideally <70. Continue statin. The ASCVD Risk score Mikey Bussing DC Jr., et al., 2013) failed to calculate for the following reasons:   The 2013 ASCVD risk score is only valid for ages 31 to 80       HTN (hypertension)    Chronic, stable. Continue current regimen.       BPH with obstruction/lower urinary tract symptoms    Stable period on finasteride.           Meds ordered this encounter  Medications  . diclofenac Sodium (VOLTAREN) 1 % GEL    Sig: Apply 2 g topically 3 (three) times daily.   Orders Placed This Encounter  Procedures  . DG Hand Complete Right    Standing Status:   Future    Standing Expiration Date:   08/17/2020    Order Specific Question:   Reason for Exam (SYMPTOM  OR DIAGNOSIS REQUIRED)    Answer:   eval R hand pain for 1 month anticipate OA related    Order Specific Question:   Preferred imaging location?    Answer:   [facility]    Order Specific Question:   Radiology Contrast Protocol - do NOT remove file path    Answer:    [email or web address]    Patient instructions: Work on fiber supplementation to avoid constipation.  Xray of right hand today to evaluate for arthritis. For pain, use voltaren topical anti inflammatory gel over the counter up to three times a day. Good to see you today, call us with questions.  Return as needed or in 6 months for diabetes follow up visit.   Follow up plan: Return in about 6 months (around 02/17/2020) for follow up visit.  Ria Bush, MD

## 2019-08-18 NOTE — Patient Instructions (Addendum)
Work on fiber supplementation to avoid constipation.  Xray of right hand today to evaluate for arthritis. For pain, use voltaren topical anti inflammatory gel over the counter up to three times a day. Good to see you today, call us with questions.  Return as needed or in 6 months for diabetes follow up visit.   Health Maintenance After Age 83 After age 67, you are at a higher risk for certain long-term diseases and infections as well as injuries from falls. Falls are a major cause of broken bones and head injuries in people who are older than age 40. Getting regular preventive care can help to keep you healthy and well. Preventive care includes getting regular testing and making lifestyle changes as recommended by your health care provider. Talk with your health care provider about:  Which screenings and tests you should have. A screening is a test that checks for a disease when you have no symptoms.  A diet and exercise plan that is right for you. What should I know about screenings and tests to prevent falls? Screening and testing are the best ways to find a health problem early. Early diagnosis and treatment give you the best chance of managing medical conditions that are common after age 28. Certain conditions and lifestyle choices may make you more likely to have a fall. Your health care provider may recommend:  Regular vision checks. Poor vision and conditions such as cataracts can make you more likely to have a fall. If you wear glasses, make sure to get your prescription updated if your vision changes.  Medicine review. Work with your health care provider to regularly review all of the medicines you are taking, including over-the-counter medicines. Ask your health care provider about any side effects that may make you more likely to have a fall. Tell your health care provider if any medicines that you take make you feel dizzy or sleepy.  Osteoporosis screening. Osteoporosis is a condition  that causes the bones to get weaker. This can make the bones weak and cause them to break more easily.  Blood pressure screening. Blood pressure changes and medicines to control blood pressure can make you feel dizzy.  Strength and balance checks. Your health care provider may recommend certain tests to check your strength and balance while standing, walking, or changing positions.  Foot health exam. Foot pain and numbness, as well as not wearing proper footwear, can make you more likely to have a fall.  Depression screening. You may be more likely to have a fall if you have a fear of falling, feel emotionally low, or feel unable to do activities that you used to do.  Alcohol use screening. Using too much alcohol can affect your balance and may make you more likely to have a fall. What actions can I take to lower my risk of falls? General instructions  Talk with your health care provider about your risks for falling. Tell your health care provider if: ? You fall. Be sure to tell your health care provider about all falls, even ones that seem minor. ? You feel dizzy, sleepy, or off-balance.  Take over-the-counter and prescription medicines only as told by your health care provider. These include any supplements.  Eat a healthy diet and maintain a healthy weight. A healthy diet includes low-fat dairy products, low-fat (lean) meats, and fiber from whole grains, beans, and lots of fruits and vegetables. Home safety  Remove any tripping hazards, such as rugs, cords, and clutter.  Install safety equipment such as grab bars in bathrooms and safety rails on stairs.  Keep rooms and walkways well-lit. Activity   Follow a regular exercise program to stay fit. This will help you maintain your balance. Ask your health care provider what types of exercise are appropriate for you.  If you need a cane or walker, use it as recommended by your health care provider.  Wear supportive shoes that have  nonskid soles. Lifestyle  Do not drink alcohol if your health care provider tells you not to drink.  If you drink alcohol, limit how much you have: ? 0-1 drink a day for women. ? 0-2 drinks a day for men.  Be aware of how much alcohol is in your drink. In the [place] one drink equals one typical bottle of beer (12 oz), one-half glass of wine (5 oz), or one shot of hard liquor (1 oz).  Do not use any products that contain nicotine or tobacco, such as cigarettes and e-cigarettes. If you need help quitting, ask your health care provider. Summary  Having a healthy lifestyle and getting preventive care can help to protect your health and wellness after age 5.  Screening and testing are the best way to find a health problem early and help you avoid having a fall. Early diagnosis and treatment give you the best chance for managing medical conditions that are more common for people who are older than age 81.  Falls are a major cause of broken bones and head injuries in people who are older than age 2. Take precautions to prevent a fall at home.  Work with your health care provider to learn what changes you can make to improve your health and wellness and to prevent falls. This information is not intended to replace advice given to you by your health care provider. Make sure you discuss any questions you have with your health care provider. Document Revised: 05/28/2018 Document Reviewed: 12/18/2016 Elsevier Patient Education  2020 [employer].

## 2019-08-18 NOTE — Assessment & Plan Note (Signed)
Chronic, stable period on metformin 500mg  bid.

## 2019-08-18 NOTE — Assessment & Plan Note (Signed)
New anticipate OA related - check hand xrays today. rec voltaren gel OTC PRN pain.

## 2019-08-18 NOTE — Assessment & Plan Note (Addendum)
Anticipate essential tremor.  No signs of parkinsonism.  Continue gabapentin.

## 2019-08-18 NOTE — Assessment & Plan Note (Signed)
Ongoing. Managed with 600mg  gabapentin at 6pm and again at 8pm. Also continues alpha lipoic acid.

## 2019-08-18 NOTE — Assessment & Plan Note (Signed)
Continue aspirin, statin.  

## 2019-08-18 NOTE — Assessment & Plan Note (Signed)
Weight very stable. Goal weight has been 175 lbs.

## 2019-08-18 NOTE — Assessment & Plan Note (Signed)
Stable period on finasteride 

## 2019-08-18 NOTE — Assessment & Plan Note (Signed)
Continue vit D 1000 IU daily. 

## 2019-08-18 NOTE — Assessment & Plan Note (Signed)
Chronic, stable. Continue current regimen. 

## 2019-08-18 NOTE — Assessment & Plan Note (Signed)
Chronic, LDL ideally <70. Continue statin. The ASCVD Risk score Mikey Bussing DC Jr., et al., 2013) failed to calculate for the following reasons:   The 2013 ASCVD risk score is only valid for ages 25 to 64

## 2019-08-20 ENCOUNTER — Other Ambulatory Visit: Payer: Self-pay | Admitting: Family Medicine

## 2019-08-30 ENCOUNTER — Other Ambulatory Visit: Payer: Self-pay | Admitting: Family Medicine

## 2019-09-14 ENCOUNTER — Other Ambulatory Visit: Payer: Self-pay | Admitting: Family Medicine

## 2019-09-19 DIAGNOSIS — U071 COVID-19: Secondary | ICD-10-CM

## 2019-09-19 HISTORY — DX: COVID-19: U07.1

## 2019-09-30 ENCOUNTER — Other Ambulatory Visit: Payer: Self-pay | Admitting: Family Medicine

## 2019-10-08 ENCOUNTER — Other Ambulatory Visit: Payer: Self-pay

## 2019-10-08 ENCOUNTER — Ambulatory Visit
Admission: EM | Admit: 2019-10-08 | Discharge: 2019-10-08 | Disposition: A | Discharge status: Home / Self Care | Payer: Medicare Other | Attending: Family Medicine | Admitting: Family Medicine

## 2019-10-08 ENCOUNTER — Encounter: Payer: Self-pay | Admitting: Emergency Medicine

## 2019-10-08 ENCOUNTER — Telehealth: Payer: Self-pay

## 2019-10-08 ENCOUNTER — Ambulatory Visit (INDEPENDENT_AMBULATORY_CARE_PROVIDER_SITE_OTHER): Payer: Medicare Other

## 2019-10-08 DIAGNOSIS — J988 Other specified respiratory disorders: Secondary | ICD-10-CM | POA: Insufficient documentation

## 2019-10-08 DIAGNOSIS — Z20822 Contact with and (suspected) exposure to covid-19: Secondary | ICD-10-CM

## 2019-10-08 DIAGNOSIS — R0981 Nasal congestion: Secondary | ICD-10-CM

## 2019-10-08 DIAGNOSIS — R531 Weakness: Secondary | ICD-10-CM | POA: Diagnosis not present

## 2019-10-08 DIAGNOSIS — R52 Pain, unspecified: Secondary | ICD-10-CM

## 2019-10-08 DIAGNOSIS — J90 Pleural effusion, not elsewhere classified: Secondary | ICD-10-CM | POA: Diagnosis not present

## 2019-10-08 DIAGNOSIS — R05 Cough: Secondary | ICD-10-CM

## 2019-10-08 LAB — CBC WITH DIFFERENTIAL/PLATELET
Abs Immature Granulocytes: 0.03 10*3/uL (ref 0.00–0.07)
Basophils Absolute: 0 10*3/uL (ref 0.0–0.1)
Basophils Relative: 0 %
Eosinophils Absolute: 0 10*3/uL (ref 0.0–0.5)
Eosinophils Relative: 0 %
HCT: 43.4 % (ref 39.0–52.0)
Hemoglobin: 14.9 g/dL (ref 13.0–17.0)
Immature Granulocytes: 1 %
Lymphocytes Relative: 13 %
Lymphs Abs: 0.9 10*3/uL (ref 0.7–4.0)
MCH: 30.5 pg (ref 26.0–34.0)
MCHC: 34.3 g/dL (ref 30.0–36.0)
MCV: 88.9 fL (ref 80.0–100.0)
Monocytes Absolute: 0.9 10*3/uL (ref 0.1–1.0)
Monocytes Relative: 13 %
Neutro Abs: 4.9 10*3/uL (ref 1.7–7.7)
Neutrophils Relative %: 73 %
Platelets: 173 10*3/uL (ref 150–400)
RBC: 4.88 MIL/uL (ref 4.22–5.81)
RDW: 13.1 % (ref 11.5–15.5)
WBC: 6.6 10*3/uL (ref 4.0–10.5)
nRBC: 0 % (ref 0.0–0.2)

## 2019-10-08 LAB — COMPREHENSIVE METABOLIC PANEL
ALT: 25 U/L (ref 0–44)
AST: 32 U/L (ref 15–41)
Albumin: 4.2 g/dL (ref 3.5–5.0)
Alkaline Phosphatase: 50 U/L (ref 38–126)
Anion gap: 11 (ref 5–15)
BUN: 25 mg/dL — ABNORMAL HIGH (ref 8–23)
CO2: 27 mmol/L (ref 22–32)
Calcium: 9.5 mg/dL (ref 8.9–10.3)
Chloride: 97 mmol/L — ABNORMAL LOW (ref 98–111)
Creatinine, Ser: 1.1 mg/dL (ref 0.61–1.24)
GFR calc Af Amer: >60 mL/min (ref >60)
GFR calc non Af Amer: >60 mL/min (ref >60)
Glucose, Bld: 149 mg/dL — ABNORMAL HIGH (ref 70–99)
Potassium: 4.5 mmol/L (ref 3.5–5.1)
Sodium: 135 mmol/L (ref 135–145)
Total Bilirubin: 1 mg/dL (ref 0.3–1.2)
Total Protein: 7.7 g/dL (ref 6.5–8.1)

## 2019-10-08 LAB — SARS CORONAVIRUS 2 (TAT 6-24 HRS): SARS Coronavirus 2: POSITIVE — AB

## 2019-10-08 IMAGING — CR DG CHEST 2V
2 series · 2 of 2 positions shown · non-contrast
Comparison: PA and lateral chest [DATE].

CLINICAL DATA: Cough, congestion, weakness and body aches for 4-5
days.

EXAM:
CHEST - 2 VIEW

[chest pa]
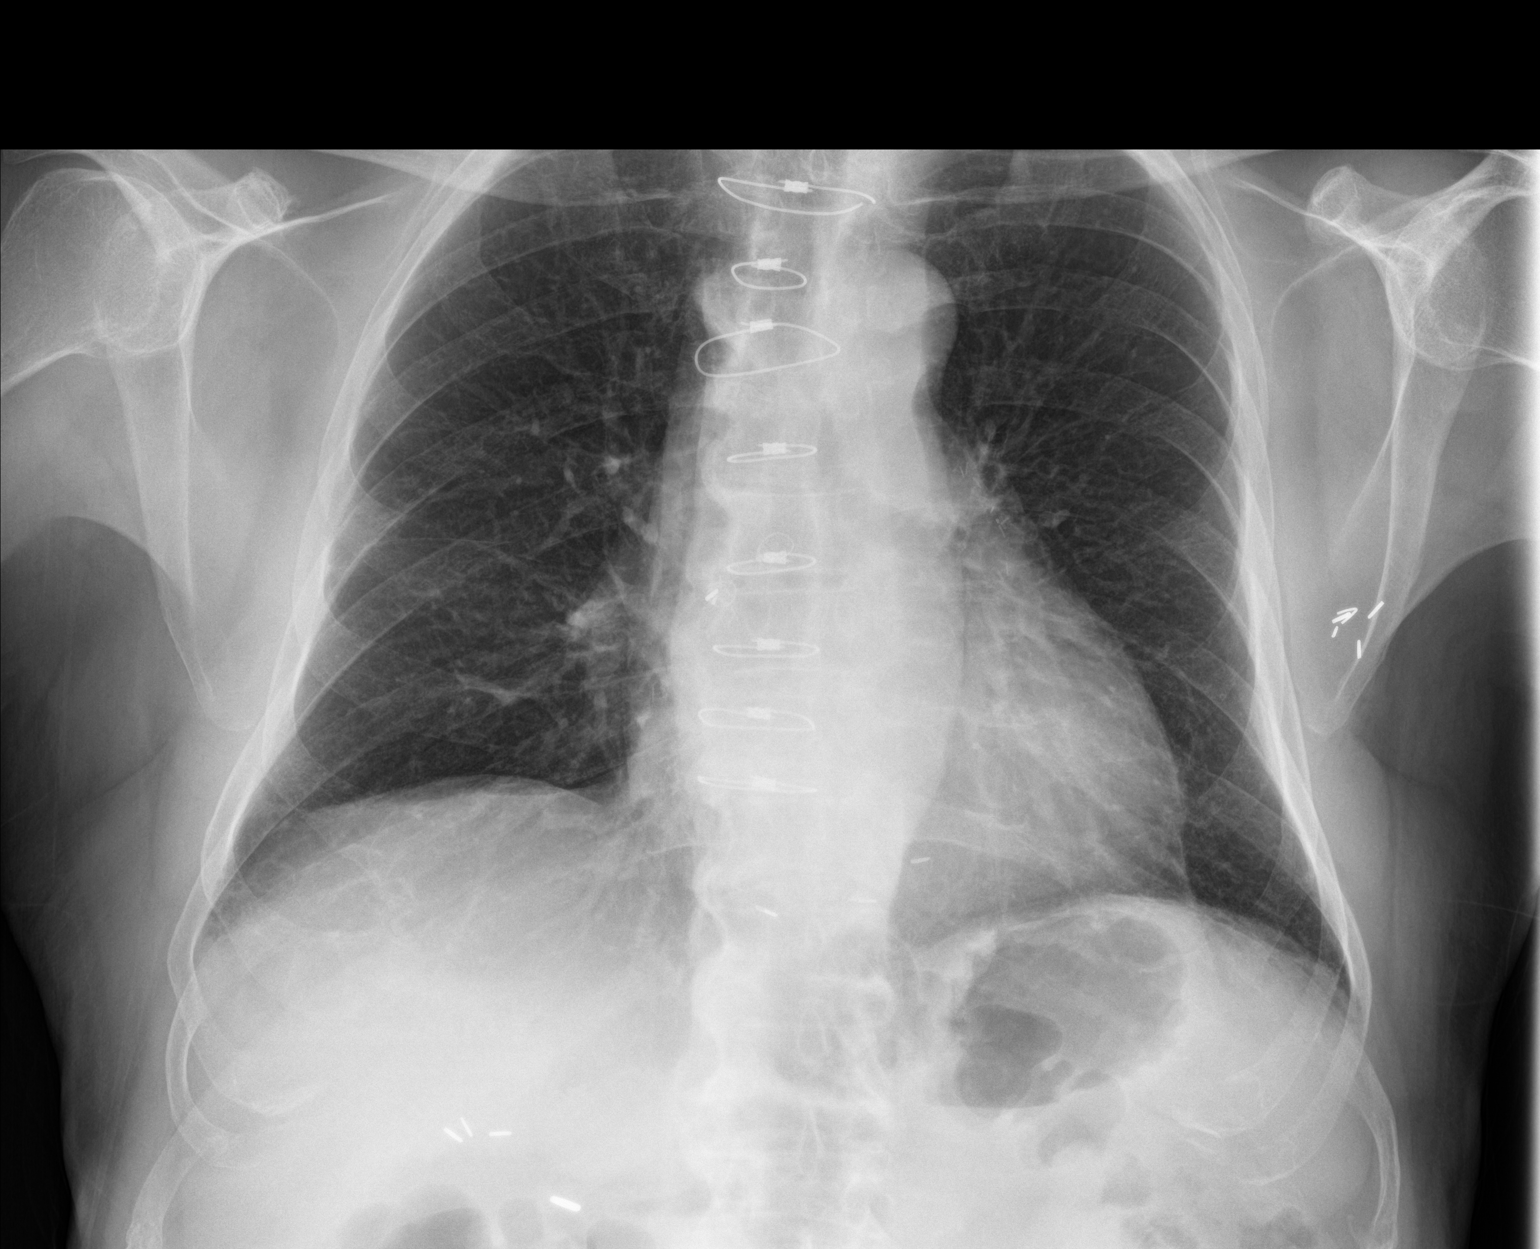

[chest lat]
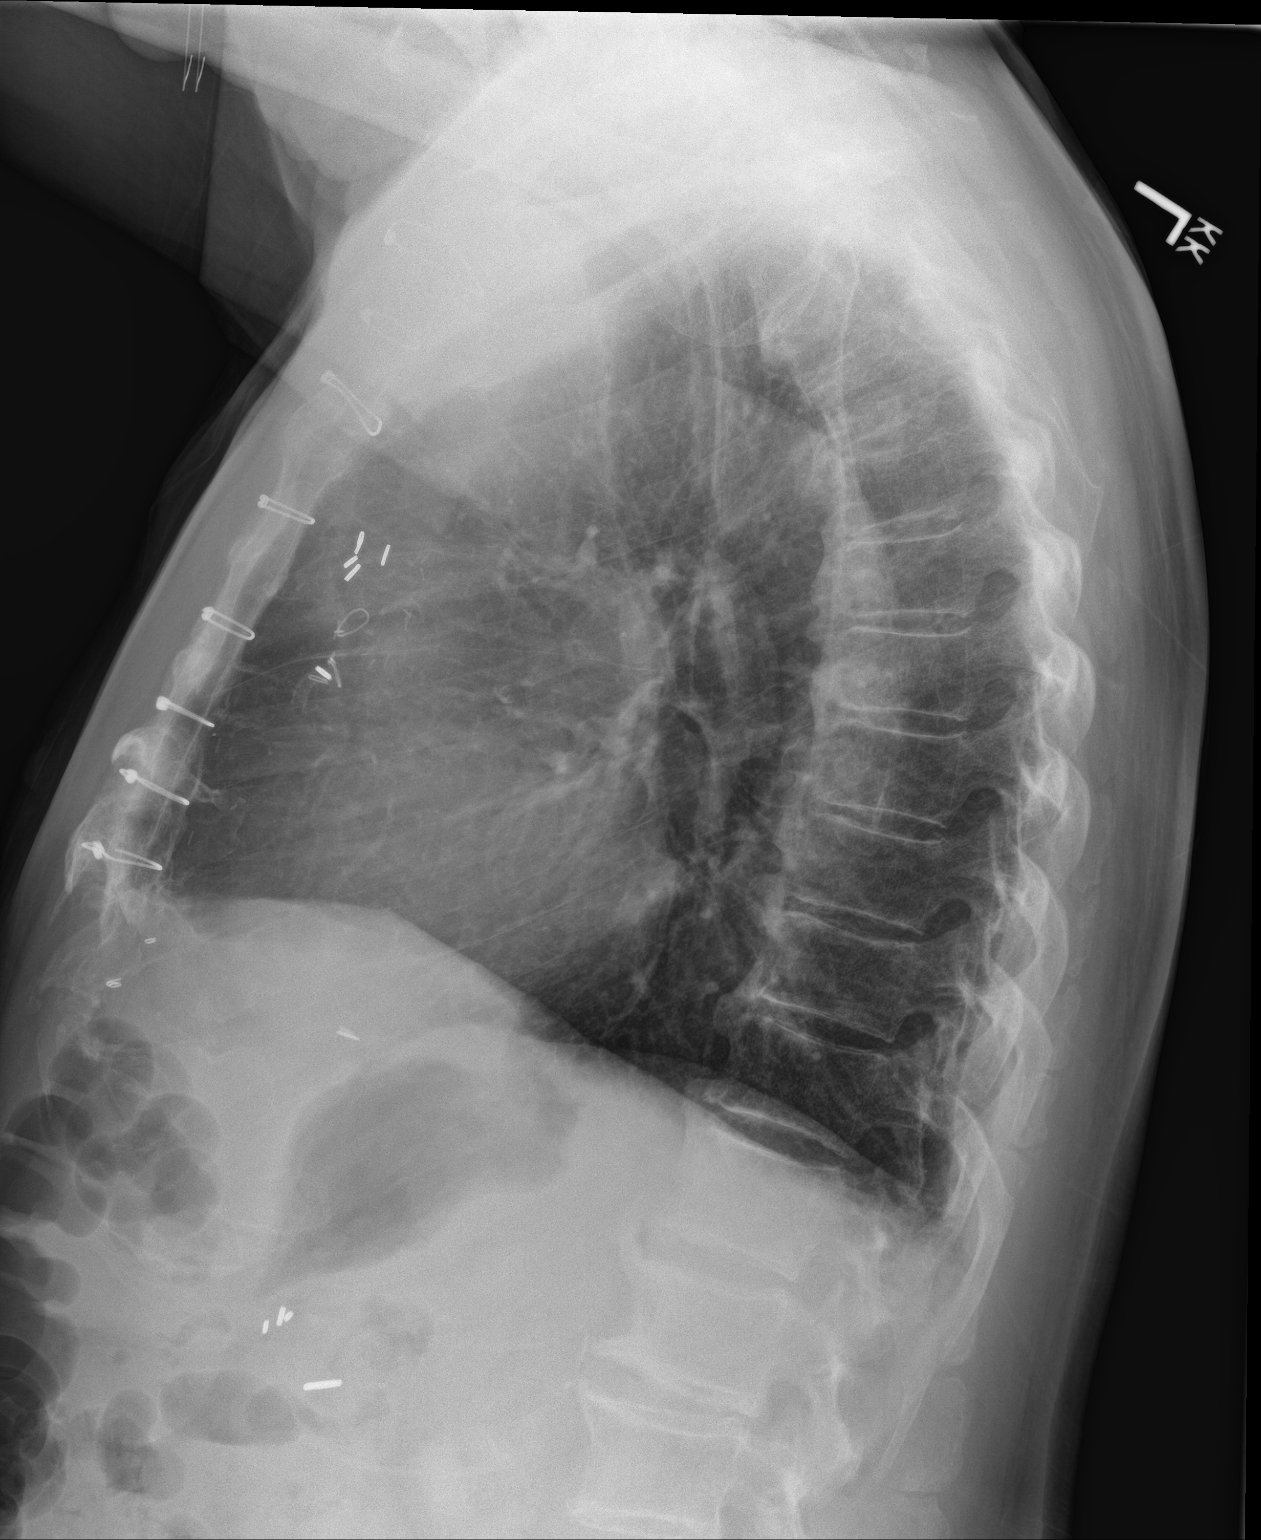

[2 of 2 positions shown; findings below may reference images not displayed]

FINDINGS: The lungs are clear. The patient is status post CABG. No
pneumothorax or pleural effusion. Surgical clips in the left axilla
and right upper quadrant noted. No acute or focal bony abnormality.
IMPRESSION: No acute disease.

## 2019-10-08 MED ORDER — BENZONATATE 200 MG PO CAPS: 200.0000 mg | ORAL_CAPSULE | Freq: Three times a day (TID) | ORAL | 0 refills | Status: DC | PRN

## 2019-10-08 MED ORDER — IPRATROPIUM BROMIDE 0.06 % NA SOLN: 2.0000 | Freq: Four times a day (QID) | NASAL | 0 refills | Status: DC | PRN

## 2019-10-08 NOTE — Telephone Encounter (Signed)
Either way, would advice eval today at [facility] vs ER.  Thanks.

## 2019-10-08 NOTE — Telephone Encounter (Signed)
I spoke with Benjie Karvonen (DPR signed) and notified as instructed and Benjie Karvonen said pt and his wife went and covid test was positive for both pt and his wife. Benjie Karvonen said the CXR was clear and pt was given medication, Benjie Karvonen thinks prednisone but has not picked up med yet. FYI to Dr Darnell Level and Dr Damita Dunnings.

## 2019-10-08 NOTE — Telephone Encounter (Signed)
Aaron Stafford calling back and pt refuses to go to ED. I advised pt could go to UC if refuses ED. Aaron Stafford request someone from [facility] call pt. I spoke with pt; pt said does not want to go to ED because of long wait but pt will go to UC down the street from where he lives for eval and possible testing. I Spoke with Aaron Stafford pts granddaughter (DPR signed) who is an Therapist, sports said that she just left a thermometer on the porch for pt; and Aaron Stafford will call pt and encourage him to be seen. Aaron Stafford does not drive and I advised since pt passed out this morning he probably should not drive. Aaron Stafford said she thinks she knows why pt may have passed out this morning because pt took more ambien last night so that he could sleep and so pt over medicated himself. Aaron Stafford will call and talk with pt. FYI to Aaron Stafford as PCP and Aaron Stafford who is in office.

## 2019-10-08 NOTE — Telephone Encounter (Signed)
Aaron Stafford and Aaron Stafford pts son and daughter in law was on phone and pt was in bathroom this morning about to use mouthwash and pt felt like he was going to pass out and pt did pass out; pt is not sure if he hit his head or not. Aaron Stafford said he is so weak he can hardly hold his head up; pt has been exposed to + covid at his church; pt has S/T, very weak, achy all over, no fever. Aaron Stafford said he will call his father and let him know he is going to [facility] ED by EMS. FYI to Dr Darnell Level as PCP and Dr Damita Dunnings as being in office today.

## 2019-10-08 NOTE — ED Provider Notes (Signed)
[facility]    CSN: [id number] Arrival date & time: 10/08/19  1138      History   Chief Complaint Chief Complaint  Patient presents with  . Cough  . Fatigue   HPI   85 year old male presents with respiratory symptoms, fatigue, generalized weakness.  Patient reports that his symptoms started on Monday. He reports that he has cough, congestion, fatigue, and generalized weakness. Patient states that he fell in the bathroom this morning and had difficulty getting back up. He denies any injury from the fall. Patient states that he had to "crawl back to bed". 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 His wife is also sick. No documented fever. Denies shortness of breath. Concern for COVID-19. No other complaints.  Past Medical History:  Diagnosis Date  . CAD (coronary artery disease)    with stents  . Chronic rhinitis    ENT [place]  . Diabetes mellitus type II   . Diverticulosis 06/2011   by CT and colonoscopy  . Fatty liver 06/2011   by CT  . GERD (gastroesophageal reflux disease)   . History of BPH   . History of shingles   . HLD (hyperlipidemia)   . HTN (hypertension)   . Ischemic heart disease   . Melanoma ([place]) 12/2015   L forearm, scalp  . MI (myocardial infarction) ([place]) 1995  . Shingles rash 09/26/2012  . Sialadenitis 05/01/2017    Patient Active Problem List   Diagnosis Date Noted  . Right hand pain 08/18/2019  . Tremor 08/14/2018  . Urinary urgency 03/11/2017  . Peripheral neuropathy 10/05/2015  . Advanced care planning/counseling discussion 06/26/2014  . Stress 08/09/2013  . BPH with obstruction/lower urinary tract symptoms   . Medicare annual wellness visit, subsequent 03/25/2011  . S/P CABG (coronary artery bypass graft) 06/07/2010  . HTN (hypertension)   . Hyperlipidemia associated with type 2 diabetes mellitus ([place])   . Type 2 diabetes, controlled, with peripheral  neuropathy ([place])   . GERD (gastroesophageal reflux disease)   . Chronic rhinitis   . Vitamin D deficiency 04/11/2008  . Insomnia 03/14/2008  . Overweight with body mass index (BMI) 25.0-29.9 07/03/2006  . ERECTILE DYSFUNCTION 07/01/2006  . Coronary atherosclerosis 07/01/2006  . IRRITABLE BOWEL SYNDROME 07/01/2006    Past Surgical History:  Procedure Laterality Date  . ANKLE SURGERY  03/2002   Left ankle fracture  . CARDIAC CATHETERIZATION    . CHOLECYSTECTOMY  1985  . COLONOSCOPY     diverticulosis, tortuous colon with looping [name])  . CORONARY ARTERY BYPASS GRAFT  05/1993   x6 MI Attemped angioplasty  . CORONARY STENT PLACEMENT  07/25/2009   Multi-Link VISION Cobalt chromium   . CYSTOSCOPY  10/03/08   Normal-Dr. Bernardo Heater  . INGUINAL HERNIA REPAIR  11/2000   and ventral hernia repair       Home Medications    Prior to Admission medications   Medication Sig Start Date End Date Taking? Authorizing Provider  aspirin 81 MG tablet Take 81 mg by mouth daily.     Yes [provider]  Cholecalciferol (VITAMIN D3) 1000 UNITS CAPS Take 1 capsule (1,000 Units total) by mouth daily. Patient taking differently: Take 1 capsule by mouth as needed.  12/14/14  Yes Ria Bush, MD  Cranberry 500 MG CAPS Take 2 capsules (1,000 mg total) by mouth daily. 02/03/19  Yes Ria Bush, MD  finasteride (PROSCAR) 5 MG tablet TAKE 1  TABLET BY MOUTH EVERY DAY 07/21/19  Yes Ria Bush, MD  fluticasone [facility]) 50 MCG/ACT nasal spray Place 2 sprays into both nostrils daily. Patient taking differently: Place 2 sprays into both nostrils daily. As needed 09/04/14  Yes Jan Fireman, PA-C  furosemide (LASIX) 20 MG tablet TAKE 1 TABLET BY MOUTH EVERY DAY 04/28/19  Yes Ria Bush, MD  gabapentin (NEURONTIN) 300 MG capsule TAKE 2 CAPSULES BY MOUTH TWICE A DAY 04/29/19  Yes Ria Bush, MD  hydrochlorothiazide (HYDRODIURIL) 25 MG tablet TAKE 1 TABLET BY MOUTH EVERY DAY  09/30/19  Yes Ria Bush, MD  losartan (COZAAR) 25 MG tablet TAKE 1 TABLET BY MOUTH EVERY DAY 09/21/18  Yes Ria Bush, MD  metFORMIN (GLUCOPHAGE) 500 MG tablet TAKE 1 TABLET (500 MG TOTAL) BY MOUTH 2 (TWO) TIMES DAILY WITH A MEAL. 08/30/19  Yes Ria Bush, MD  Multiple Vitamin (MULTIVITAMIN) tablet Take 1 tablet by mouth daily.   Yes [provider]  pravastatin (PRAVACHOL) 40 MG tablet TAKE 1 TABLET BY MOUTH EVERYDAY AT BEDTIME 08/20/19  Yes Ria Bush, MD  traZODone (DESYREL) 50 MG tablet TAKE 1 TO 1 AND 1/2 TABLETS BY MOUTH AT BEDTIME AS NEEDED FOR SLEEP 12/27/18  Yes Ria Bush, MD  vitamin B-12 (CYANOCOBALAMIN) 250 MCG tablet Take 250 mcg by mouth once a week.    Yes [provider]  Alpha-Lipoic Acid 600 MG CAPS Take 1 capsule (600 mg total) by mouth 2 (two) times daily. 06/26/16   Ria Bush, MD  amLODipine (NORVASC) 5 MG tablet TAKE 1 TABLET BY MOUTH EVERY DAY 09/14/19   Ria Bush, MD  benzonatate (TESSALON) 200 MG capsule Take 1 capsule (200 mg total) by mouth 3 (three) times daily as needed for cough. 10/08/19   Coral Spikes, DO  Blood Glucose Monitoring Suppl ([place]) w/Device KIT 1 Device by Does not apply route daily. Test blood sugar once daily and as directed. Dx E45.40 04/10/18   Ria Bush, MD  diclofenac Sodium (VOLTAREN) 1 % GEL Apply 2 g topically 3 (three) times daily. 08/18/19   Ria Bush, MD  ipratropium (ATROVENT) 0.06 % nasal spray Place 2 sprays into both nostrils 4 (four) times daily as needed for rhinitis. 10/08/19   Stacee Earp, Barnie Del, DO  OneTouch Delica Lancets 98J MISC CHECK BLOOD SUGAR ONCE DAILY AND AS INSTRUCTED. DX E11.42 08/30/19   Ria Bush, MD  [facility] VERIO test strip USE TO TEST BLOOD SUGAR ONCE DAILY 08/30/19   Ria Bush, MD  valACYclovir (VALTREX) 1000 MG tablet Take 2 tablets (2,000 mg total) by mouth 2 (two) times daily. For 1 day 02/03/19   Ria Bush, MD    Family History Family History  Problem Relation Age of Onset  . Kidney failure Mother   . Coronary artery disease Mother   . Heart failure Mother   . Hypertension Brother   . Heart attack Brother   . Alcohol abuse Brother   . Hypertension Brother   . Hypertension Sister     Social History Social History   Tobacco Use  . Smoking status: Never Smoker  . Smokeless tobacco: Never Used  Vaping Use  . Vaping Use: Never used  Substance Use Topics  . Alcohol use: No    Alcohol/week: 0.0 standard drinks  . Drug use: No     Allergies   Morphine sulfate, Nitroglycerin, and Contrast media [iodinated diagnostic agents]   Review of Systems Review of Systems  Constitutional: Positive for fatigue.  Respiratory: Positive for cough.   Neurological: Positive for weakness.   Physical Exam Triage Vital Signs ED Triage Vitals  Enc Vitals Group     BP 10/08/19 1157 135/66     Pulse Rate 10/08/19 1157 89     Resp 10/08/19 1157 14     Temp 10/08/19 1157 98.1 F (36.7 C)     Temp Source 10/08/19 1157 Oral     SpO2 10/08/19 1157 100 %     Weight 10/08/19 1153 171 lb (77.6 kg)     Height 10/08/19 1153 $RemoveBefor'5\' 8"'TkFPIHVcSart$  (1.727 m)     Head Circumference --      Peak Flow --      Pain Score 10/08/19 1152 0     Pain Loc --      Pain Edu? --      Excl. in [place]? --    Updated Vital Signs BP 135/66 (BP Location: Left Arm)   Pulse 89   Temp 98.1 F (36.7 C) (Oral)   Resp 14   Ht $R'5\' 8"'Lc$  (1.727 m)   Wt 77.6 kg   SpO2 100%   BMI 26.00 kg/m   Visual Acuity Right Eye Distance:   Left Eye Distance:   Bilateral Distance:    Right Eye Near:   Left Eye Near:    Bilateral Near:     Physical Exam Vitals and nursing note reviewed.  Constitutional:      General: He is not in acute distress.    Appearance: Normal appearance.  HENT:     Head: Normocephalic and atraumatic.  Eyes:     General:        Right eye: No discharge.        Left eye: No discharge.      Conjunctiva/sclera: Conjunctivae normal.  Cardiovascular:     Rate and Rhythm: Normal rate and regular rhythm.     Heart sounds: No murmur heard.   Pulmonary:     Effort: Pulmonary effort is normal. No respiratory distress.     Breath sounds: Normal breath sounds. No wheezing or rales.  Neurological:     Mental Status: He is alert.  Psychiatric:        Mood and Affect: Mood normal.        Behavior: Behavior normal.    UC Treatments / Results  Labs (all labs ordered are listed, but only abnormal results are displayed) Labs Reviewed  COMPREHENSIVE METABOLIC PANEL - Abnormal; Notable for the following components:      Result Value   Chloride 97 (*)    Glucose, Bld 149 (*)    BUN 25 (*)    All other components within normal limits  SARS CORONAVIRUS 2 (TAT 6-24 HRS)  CBC WITH DIFFERENTIAL/PLATELET    EKG   Radiology DG Chest 2 View  Result Date: 10/08/2019 CLINICAL DATA:  Cough, congestion, weakness and body aches for 4-5 days. EXAM: CHEST - 2 VIEW COMPARISON:  PA and lateral chest 05/01/2017. FINDINGS: The lungs are clear. The patient is status post CABG. No pneumothorax or pleural effusion. Surgical clips in the left axilla and right upper quadrant noted. No acute or focal bony abnormality. IMPRESSION: No acute disease. Electronically Signed   By: Inge Rise M.D.   On: 10/08/2019 12:40    Procedures Procedures (including critical care time)  Medications Ordered in UC Medications - No data to display  Initial Impression / Assessment and Plan / UC Course  I have reviewed the triage vital signs  and the nursing notes.  Pertinent labs & imaging results that were available during my care of the patient were reviewed by me and considered in my medical decision making (see chart for details).    85 year old male presents with respiratory infection. Suspected COVID-19. Awaiting test result. Labs unremarkable today. Chest x-ray obtained and independently reviewed by me.  Interpretation: No acute findings. No pneumonia. Treating with Tessalon Perles and Atrovent nasal spray. Supportive care.  Final Clinical Impressions(s) / UC Diagnoses   Final diagnoses:  Respiratory infection  Suspected COVID-19 virus infection     Discharge Instructions     Stay home.  Medication as prescribed.  If you worsen, go to the ER.  Take care  Dr. Lacinda Axon     ED Prescriptions    Medication Sig Dispense Auth. Provider   ipratropium (ATROVENT) 0.06 % nasal spray Place 2 sprays into both nostrils 4 (four) times daily as needed for rhinitis. 15 mL Catherene Kaleta G, DO   benzonatate (TESSALON) 200 MG capsule Take 1 capsule (200 mg total) by mouth 3 (three) times daily as needed for cough. 30 capsule Coral Spikes, DO     PDMP not reviewed this encounter.   Coral Spikes, DO 10/08/19 1330

## 2019-10-08 NOTE — ED Triage Notes (Signed)
Patient c/o cough, chest congestion, weakness and stuffy nose that started on Monday.  Patient denies fevers.

## 2019-10-08 NOTE — Telephone Encounter (Signed)
Noted.  With covid pos, please refer to infusion clinic- please call and give them patient's contact information.  Please give FYI to patient that they should call him.  Thanks.

## 2019-10-08 NOTE — Telephone Encounter (Signed)
[facility] with pt's demographics, per Dr. Damita Dunnings.  Spoke with pt notifying him he will be contacted by the Infusion Ctr.  Pt verbalizes understanding.

## 2019-10-08 NOTE — Discharge Instructions (Addendum)
Stay home.  Medication as prescribed.  If you worsen, go to the ER.  Take care  Dr. Lacinda Axon

## 2019-10-09 ENCOUNTER — Other Ambulatory Visit (HOSPITAL_COMMUNITY): Payer: Self-pay | Admitting: Oncology

## 2019-10-09 ENCOUNTER — Telehealth (HOSPITAL_COMMUNITY): Payer: Self-pay | Admitting: Oncology

## 2019-10-09 ENCOUNTER — Encounter: Payer: Self-pay | Admitting: Oncology

## 2019-10-09 DIAGNOSIS — U071 COVID-19: Secondary | ICD-10-CM

## 2019-10-09 NOTE — Telephone Encounter (Signed)
I connected by phone with Aaron Stafford on  10/09/19 now to discuss the potential use of an new treatment for mild to moderate COVID-19 viral infection in non-hospitalized patients.   This patient is a age/sex that meets the FDA criteria for Emergency Use Authorization of casirivimab\imdevimab.  Has a (+) direct SARS-CoV-2 viral test result 1. Has mild or moderate COVID-19  2. Is ? 85 years of age and weighs ? 40 kg 3. Is NOT hospitalized due to COVID-19 4. Is NOT requiring oxygen therapy or requiring an increase in baseline oxygen flow rate due to COVID-19 5. Is within 10 days of symptom onset 6. Has at least one of the high risk factor(s) for progression to severe COVID-19 and/or hospitalization as defined in EUA. ? Specific high risk criteria : age   Symptom onset  10/08/19   I have spoken and communicated the following to the patient or parent/caregiver:   1. FDA has authorized the emergency use of casirivimab\imdevimab for the treatment of mild to moderate COVID-19 in adults and pediatric patients with positive results of direct SARS-CoV-2 viral testing who are 71 years of age and older weighing at least 40 kg, and who are at high risk for progressing to severe COVID-19 and/or hospitalization.   2. The significant known and potential risks and benefits of casirivimab\imdevimab, and the extent to which such potential risks and benefits are unknown.   3. Information on available alternative treatments and the risks and benefits of those alternatives, including clinical trials.   4. Patients treated with casirivimab\imdevimab should continue to self-isolate and use infection control measures (e.g., wear mask, isolate, social distance, avoid sharing personal items, clean and disinfect high touch surfaces, and frequent handwashing) according to CDC guidelines.    5. The patient or parent/caregiver has the option to accept or refuse casirivimab\imdevimab .   After reviewing this information  with the patient, The patient agreed to proceed with receiving casirivimab\imdevimab infusion and will be provided a copy of the Fact sheet prior to receiving the infusion.Aaron Stafford, AGNP-C [phone] ([place])

## 2019-10-10 ENCOUNTER — Ambulatory Visit (HOSPITAL_COMMUNITY)
Admission: RE | Admit: 2019-10-10 | Discharge: 2019-10-10 | Disposition: A | Discharge status: Home / Self Care | Payer: Medicare Other | Source: Ambulatory Visit | Attending: Pulmonary Disease | Admitting: Pulmonary Disease

## 2019-10-10 DIAGNOSIS — U071 COVID-19: Secondary | ICD-10-CM | POA: Insufficient documentation

## 2019-10-10 DIAGNOSIS — Z23 Encounter for immunization: Secondary | ICD-10-CM | POA: Diagnosis not present

## 2019-10-10 MED ORDER — DIPHENHYDRAMINE HCL 50 MG/ML IJ SOLN: 50.0000 mg | Freq: Once | INTRAMUSCULAR | Status: DC | PRN

## 2019-10-10 MED ORDER — METHYLPREDNISOLONE SODIUM SUCC 125 MG IJ SOLR: 125.0000 mg | Freq: Once | INTRAMUSCULAR | Status: DC | PRN

## 2019-10-10 MED ORDER — ALBUTEROL SULFATE HFA 108 (90 BASE) MCG/ACT IN AERS: 2.0000 | INHALATION_SPRAY | Freq: Once | RESPIRATORY_TRACT | Status: DC | PRN

## 2019-10-10 MED ORDER — SODIUM CHLORIDE 0.9 % IV SOLN: INTRAVENOUS | Status: DC | PRN

## 2019-10-10 MED ORDER — SODIUM CHLORIDE 0.9 % IV SOLN
1200.0000 mg | Freq: Once | INTRAVENOUS | Status: AC
  Administered 2019-10-10: 1200 mg via INTRAVENOUS
  Filled 2019-10-10: qty 10

## 2019-10-10 MED ORDER — ACETAMINOPHEN 325 MG PO TABS
650.0000 mg | ORAL_TABLET | Freq: Once | ORAL | Status: AC
  Administered 2019-10-10: 650 mg via ORAL
  Filled 2019-10-10: qty 2

## 2019-10-10 MED ORDER — EPINEPHRINE 0.3 MG/0.3ML IJ SOAJ: 0.3000 mg | Freq: Once | INTRAMUSCULAR | Status: DC | PRN

## 2019-10-10 MED ORDER — FAMOTIDINE IN NACL 20-0.9 MG/50ML-% IV SOLN: 20.0000 mg | Freq: Once | INTRAVENOUS | Status: DC | PRN

## 2019-10-10 NOTE — Discharge Instructions (Signed)

## 2019-10-10 NOTE — Progress Notes (Signed)
  Diagnosis: COVID-19  Physician: Quay Burow, NP  Procedure: [facility]id Infusion Clinic Med: casirivimab\imdevimab infusion - Provided patient with casirivimab\imdevimab fact sheet for patients, parents and caregivers prior to infusion.  Complications: No immediate complications noted.  Discharge: Discharged home   Aaron Stafford 10/10/2019

## 2019-10-12 ENCOUNTER — Telehealth: Payer: Self-pay | Admitting: Family Medicine

## 2019-10-12 NOTE — Telephone Encounter (Signed)
When was first day of symptoms? Was it 10/08/2019?  Received mAb infusion 10/10/2019.  Please call for update on symptoms and place on covid call list every 2 days through at least 10/18/2019.

## 2019-10-13 NOTE — Telephone Encounter (Addendum)
Thank you. Would follow every 2 days through 10/16/2019.

## 2019-10-13 NOTE — Telephone Encounter (Addendum)
Spoke with pt's wife, [name] (on dpr), asking for update on pt.  States pt is feeling better.  Feels weak at times and cough every now and then and still cannot taste or smell.  Denies any fever, SOB, body aches, etc.  Sxs started 10/06/19.  Pt added to Call Log.

## 2019-10-14 NOTE — Telephone Encounter (Signed)
Noted  

## 2019-10-15 ENCOUNTER — Other Ambulatory Visit: Payer: Self-pay | Admitting: Family Medicine

## 2019-10-15 NOTE — Telephone Encounter (Signed)
Spoke with pt asking for update.  States he feels better.  Still has some chest congestion but able to cough it up.  Says he feels a little stronger this morning.

## 2019-10-22 ENCOUNTER — Other Ambulatory Visit: Payer: Self-pay | Admitting: Family Medicine

## 2019-10-26 ENCOUNTER — Telehealth: Payer: Self-pay

## 2019-10-30 ENCOUNTER — Other Ambulatory Visit: Payer: Self-pay | Admitting: Family Medicine

## 2019-10-30 NOTE — Telephone Encounter (Signed)
Open in error

## 2019-11-01 DIAGNOSIS — H6121 Impacted cerumen, right ear: Secondary | ICD-10-CM | POA: Diagnosis not present

## 2019-11-01 DIAGNOSIS — D117 Benign neoplasm of other major salivary glands: Secondary | ICD-10-CM | POA: Diagnosis not present

## 2019-11-08 ENCOUNTER — Other Ambulatory Visit: Payer: Self-pay | Admitting: Family Medicine

## 2019-11-08 NOTE — Telephone Encounter (Signed)
Gabapentin Last filled:  07/29/19, #360 Last OV:  08/18/19, AWV prt 2 Next OV:  02/23/20, 6 mo DM f/u

## 2019-11-14 ENCOUNTER — Other Ambulatory Visit: Payer: Self-pay | Admitting: Family Medicine

## 2019-11-17 ENCOUNTER — Other Ambulatory Visit: Payer: Self-pay | Admitting: Family Medicine

## 2019-11-17 NOTE — Telephone Encounter (Signed)
Pt left v/m that trazodone needs to be sent to [facility]. I spoke with Leafy Ro at [facility] and they did receive refill. Pt notified that trazodone was at [place] pt was appreciative and nothing further needed.

## 2019-12-15 DIAGNOSIS — H35373 Puckering of macula, bilateral: Secondary | ICD-10-CM | POA: Diagnosis not present

## 2019-12-15 DIAGNOSIS — H04122 Dry eye syndrome of left lacrimal gland: Secondary | ICD-10-CM | POA: Diagnosis not present

## 2019-12-15 DIAGNOSIS — H2513 Age-related nuclear cataract, bilateral: Secondary | ICD-10-CM | POA: Diagnosis not present

## 2019-12-15 DIAGNOSIS — E119 Type 2 diabetes mellitus without complications: Secondary | ICD-10-CM | POA: Diagnosis not present

## 2019-12-15 LAB — HM DIABETES EYE EXAM

## 2019-12-17 ENCOUNTER — Encounter: Payer: Self-pay | Admitting: Family Medicine

## 2019-12-18 ENCOUNTER — Emergency Department
Admission: EM | Admit: 2019-12-18 | Discharge: 2019-12-18 | Disposition: A | Discharge status: Home / Self Care | Payer: Medicare Other | Attending: Emergency Medicine | Admitting: Emergency Medicine

## 2019-12-18 ENCOUNTER — Other Ambulatory Visit: Payer: Self-pay

## 2019-12-18 ENCOUNTER — Encounter: Payer: Self-pay | Admitting: Emergency Medicine

## 2019-12-18 ENCOUNTER — Emergency Department: Payer: Medicare Other

## 2019-12-18 DIAGNOSIS — Z7984 Long term (current) use of oral hypoglycemic drugs: Secondary | ICD-10-CM | POA: Insufficient documentation

## 2019-12-18 DIAGNOSIS — E1140 Type 2 diabetes mellitus with diabetic neuropathy, unspecified: Secondary | ICD-10-CM | POA: Insufficient documentation

## 2019-12-18 DIAGNOSIS — Z7982 Long term (current) use of aspirin: Secondary | ICD-10-CM | POA: Insufficient documentation

## 2019-12-18 DIAGNOSIS — Z79899 Other long term (current) drug therapy: Secondary | ICD-10-CM | POA: Insufficient documentation

## 2019-12-18 DIAGNOSIS — I119 Hypertensive heart disease without heart failure: Secondary | ICD-10-CM | POA: Insufficient documentation

## 2019-12-18 DIAGNOSIS — M79605 Pain in left leg: Secondary | ICD-10-CM

## 2019-12-18 DIAGNOSIS — M79662 Pain in left lower leg: Secondary | ICD-10-CM | POA: Diagnosis not present

## 2019-12-18 DIAGNOSIS — Z951 Presence of aortocoronary bypass graft: Secondary | ICD-10-CM | POA: Insufficient documentation

## 2019-12-18 DIAGNOSIS — I2510 Atherosclerotic heart disease of native coronary artery without angina pectoris: Secondary | ICD-10-CM | POA: Insufficient documentation

## 2019-12-18 IMAGING — US US EXTREM LOW VENOUS*L*
1 series · 14 of 24 positions shown · non-contrast
Comparison: None.

CLINICAL DATA: Left leg pain x1 day.

EXAM:
LEFT LOWER EXTREMITY VENOUS DOPPLER ULTRASOUND
TECHNIQUE: Gray-scale sonography with compression, as well as color and duplex
ultrasound, were performed to evaluate the deep venous system(s)
from the level of the common femoral vein through the popliteal and
proximal calf veins.

[Series 1: us venous img lower uni left (dvt) · portal-venous · 14 of 33 slices shown]
[im 1/33]
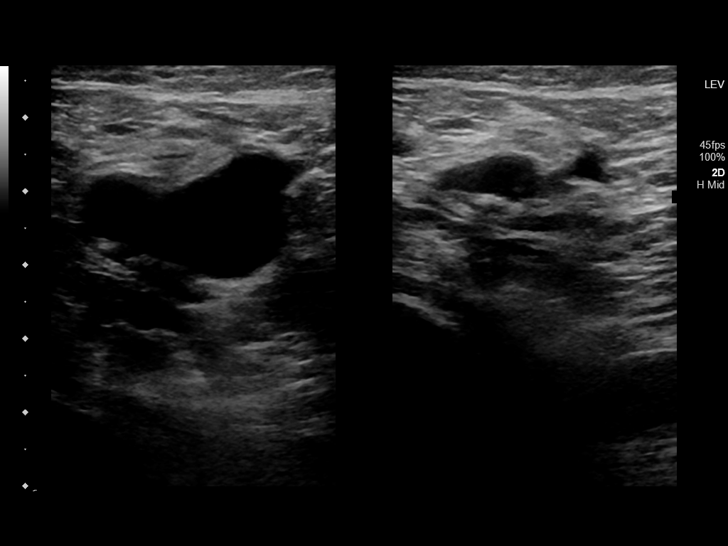
[im 3/33]
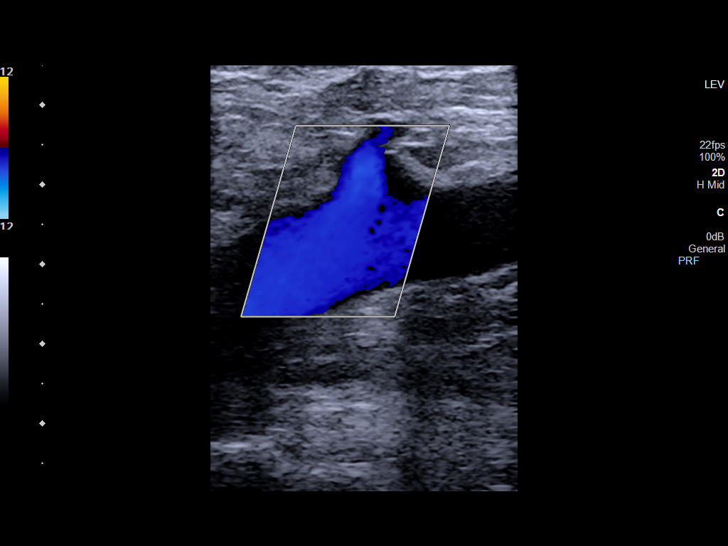
[im 6/33]
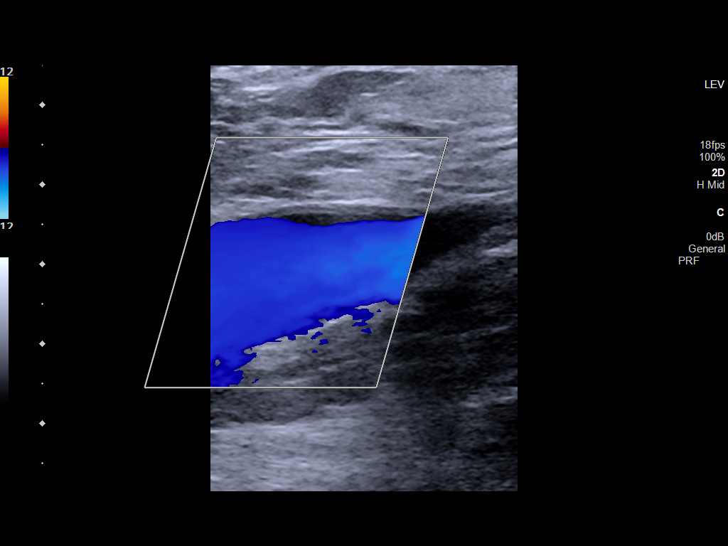
[im 9/33]
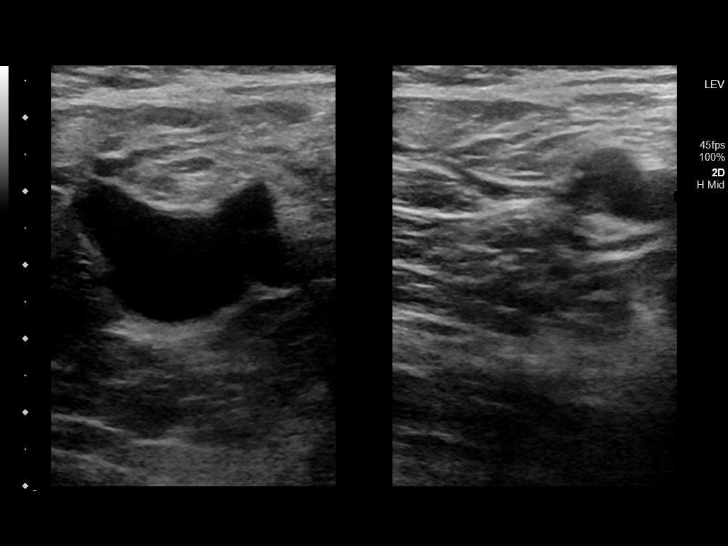
[im 10/33]
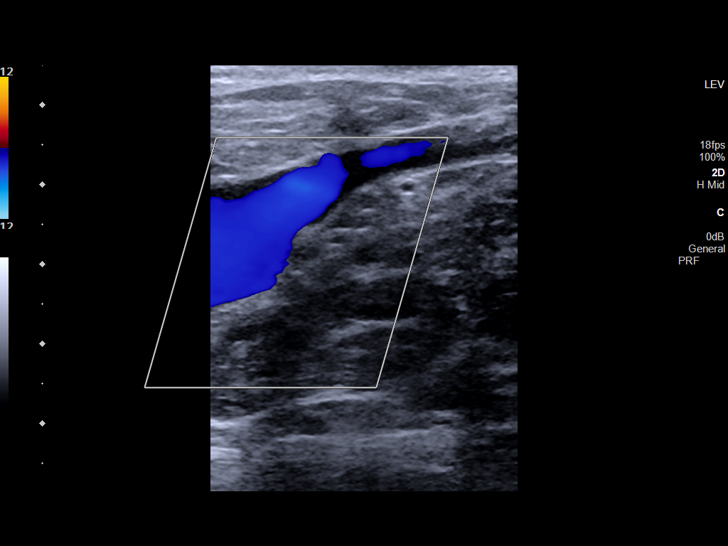
[im 13/33]
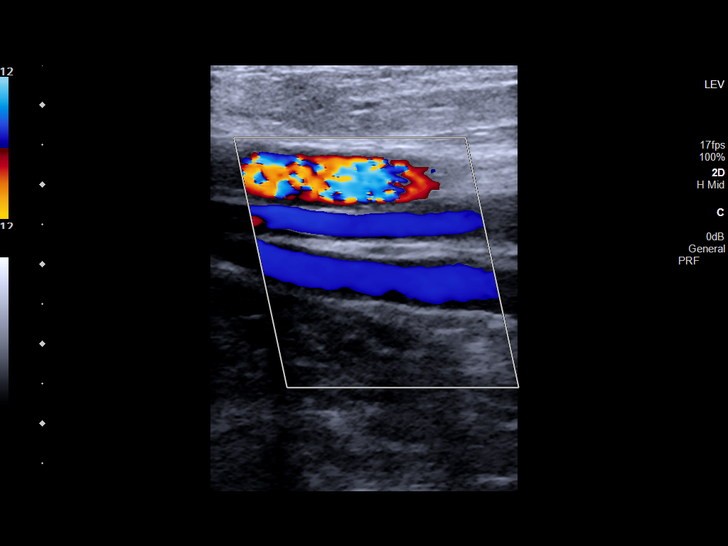
[im 16/33]
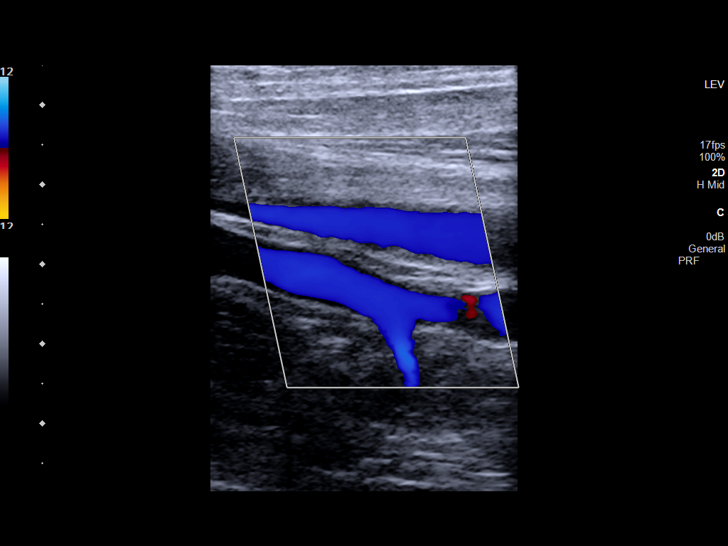
[im 17/33]
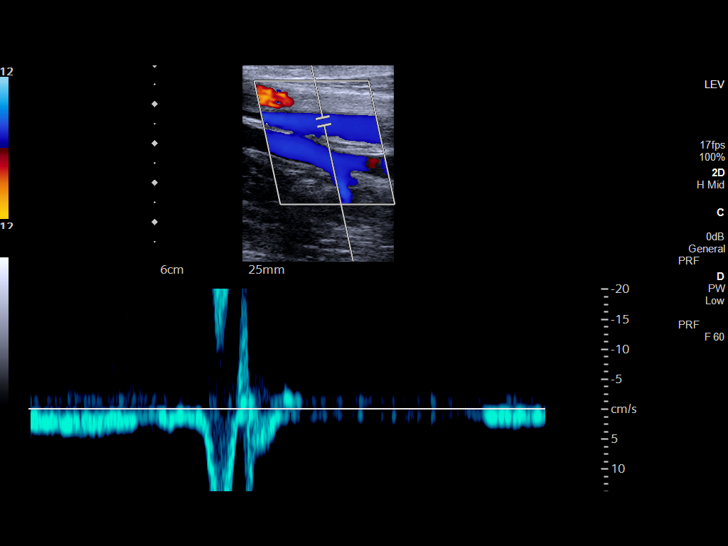
[im 20/33]
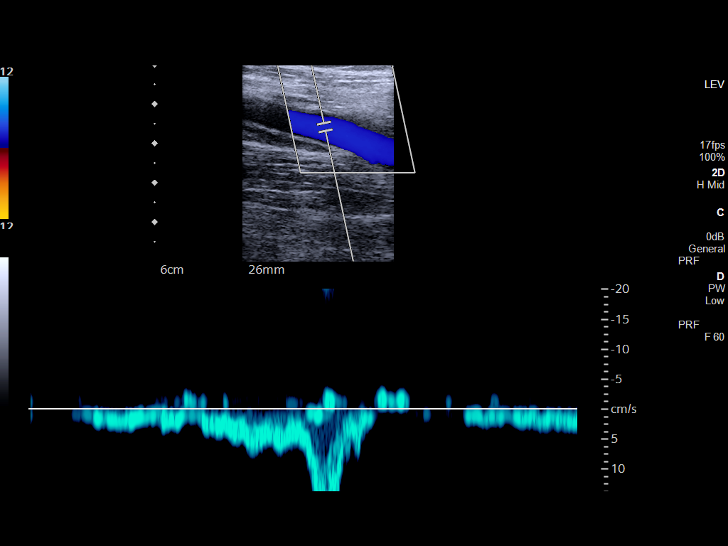
[im 23/33]
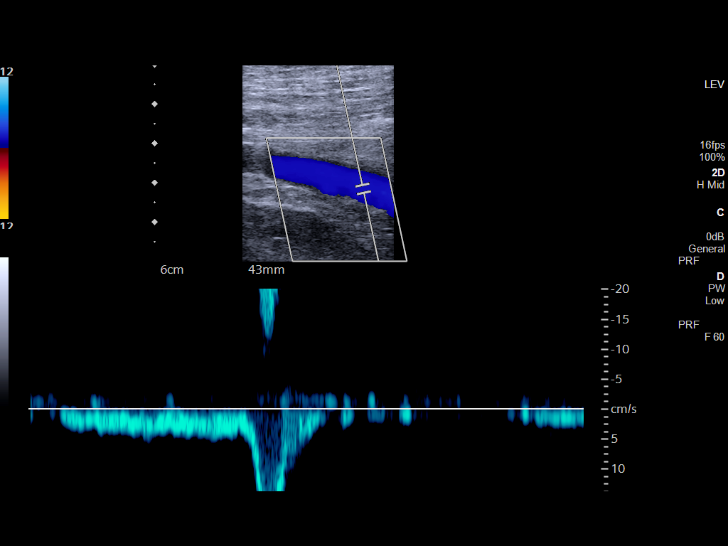
[im 26/33]
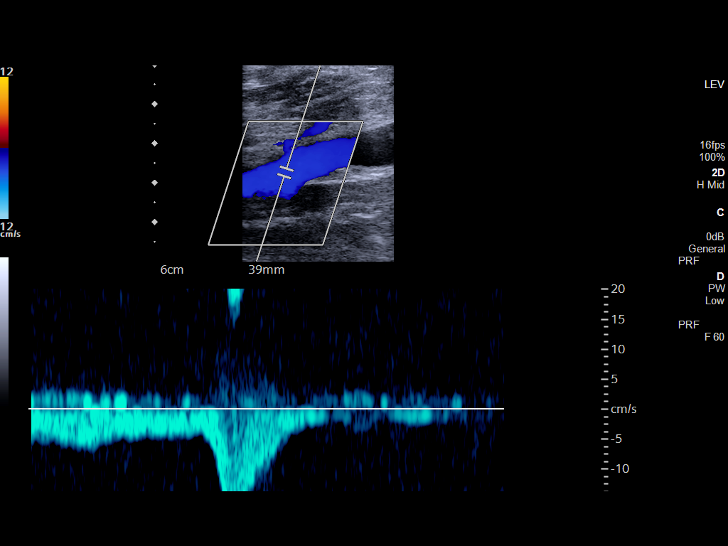
[im 27/33]
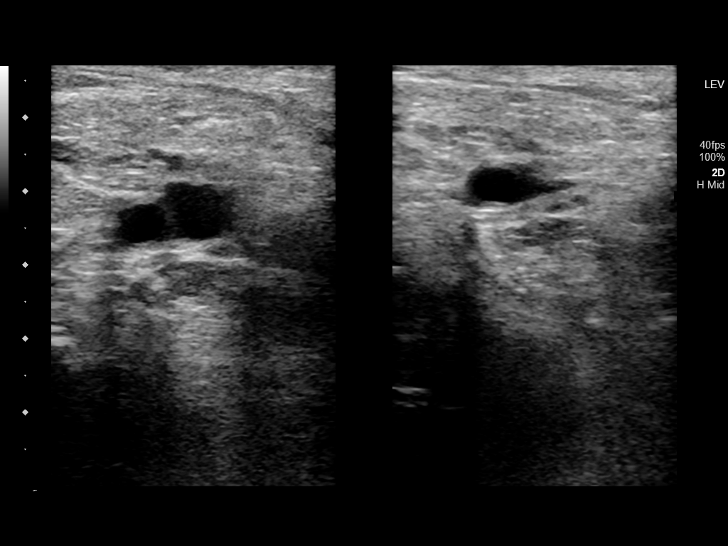
[im 30/33]
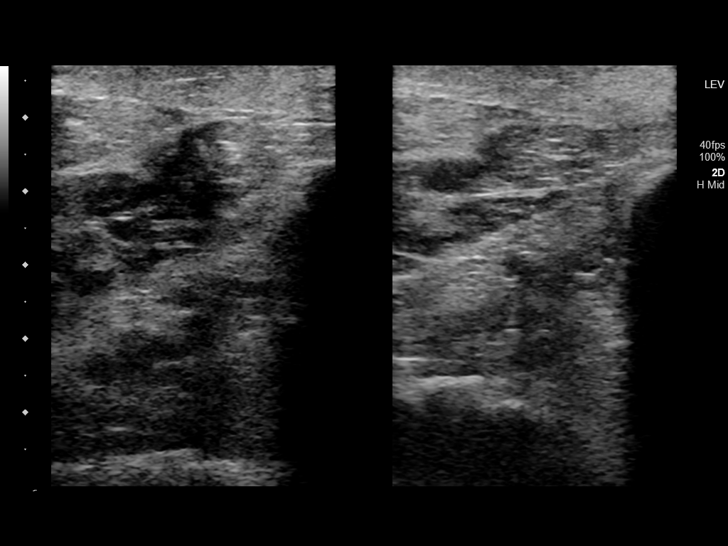
[im 33/33]
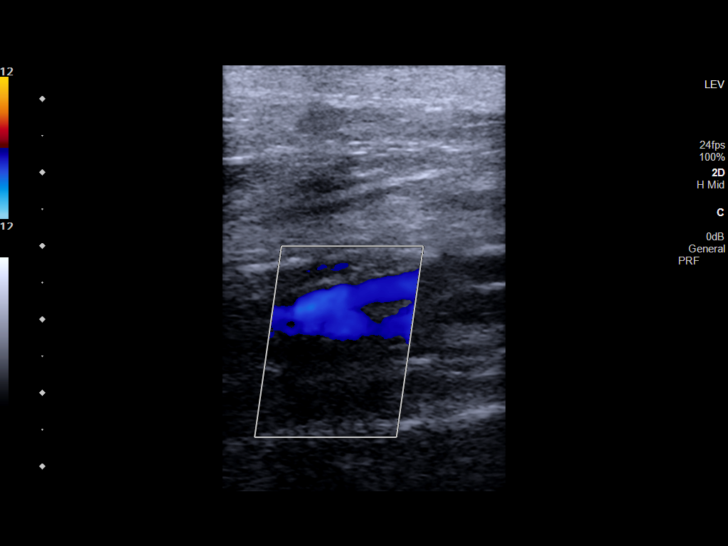

[14 of 24 positions shown; findings below may reference images not displayed]

FINDINGS: VENOUS

Normal compressibility of the LEFT common femoral, superficial
femoral, and popliteal veins, as well as the visualized calf veins.
Visualized portions of LEFTR profunda femoral vein and LEFT great
saphenous vein unremarkable. No filling defects to suggest DVT on
grayscale or color Doppler imaging. Doppler waveforms show normal
direction of venous flow, normal respiratory plasticity and response
to augmentation.

Limited views of the contralateral common femoral vein are
unremarkable.

OTHER

None.

Limitations: none
IMPRESSION: Negative.

## 2019-12-18 MED ORDER — OXYCODONE-ACETAMINOPHEN 5-325 MG PO TABS: 2.0000 | ORAL_TABLET | Freq: Four times a day (QID) | ORAL | 0 refills | Status: DC | PRN

## 2019-12-18 MED ORDER — KETOROLAC TROMETHAMINE 30 MG/ML IJ SOLN
15.0000 mg | Freq: Once | INTRAMUSCULAR | Status: AC
  Administered 2019-12-18: 15 mg via INTRAMUSCULAR
  Filled 2019-12-18: qty 1

## 2019-12-18 MED ORDER — OXYCODONE-ACETAMINOPHEN 5-325 MG PO TABS
2.0000 | ORAL_TABLET | Freq: Once | ORAL | Status: AC
  Administered 2019-12-18: 2 via ORAL
  Filled 2019-12-18: qty 2

## 2019-12-18 NOTE — ED Triage Notes (Signed)
Patient with complaint of a shooting pain in his left calf that started about 4 hours ago. Patient states that he took gabapentin and IBU with no relief.

## 2019-12-18 NOTE — ED Notes (Signed)
Assumed care of pt upon being roomed. Pt ambulated from chair in room to ED stretcher with steady gait. Reports intermittent sharp pain to left medial lower shin region. No obvious deformities noted. Skin appropriate to ethnicity, cap refill < 2 seconds. AO x4, talking in full sentences with regular and unlabored breathing. Side rail up x1, call bell within reach

## 2019-12-18 NOTE — ED Notes (Signed)
Patient transported to Ultrasound 

## 2019-12-18 NOTE — Discharge Instructions (Signed)
Your workup in the Emergency Department today was reassuring.  We did not find any specific abnormalities.  We recommend you drink plenty of fluids, take your regular medications and/or any new ones prescribed today, and follow up with the doctor(s) listed in these documents as recommended.  Return to the Emergency Department if you develop new or worsening symptoms that concern you.  

## 2019-12-18 NOTE — ED Provider Notes (Signed)
[facility] Emergency Department Provider Note  ____________________________________________   First MD Initiated Contact with Patient 12/18/19 [phone]     (approximate)  I have reviewed the triage vital signs and the nursing notes.   HISTORY  Chief Complaint Leg Pain    HPI Aaron Stafford is a 85 y.o. male with medical history as listed below who presents for evaluation of acute onset and severe sharp stabbing pain in his left lower leg.  No history of trauma.  No swelling or redness.  The pain started suddenly while he was at rest and he said that about every minute or so he feels another sharp stabbing pain.  He took his regular dose of gabapentin which usually helps a little bit with his neuropathy but it did not seem to change the pain.  He also took a dose of ibuprofen which did not help.  He received 2 Percocet in the emergency department waiting room and he said he feels better now although he can still feel the pain but it is mild.  He has never had symptoms like this before.  He has not had any muscle cramping and it is 1 specific location in the left lower leg on the inside near the calf muscle just above his ankle.  He has no history of blood clots.  He has been compliant with his medications and has not started taking any new medications recently.  Nothing in particular made the symptoms worse and the Percocet made it feel better.   He denies fever, chest pain or shortness of breath, nausea, vomiting, and abdominal pain.        Past Medical History:  Diagnosis Date  . CAD (coronary artery disease)    with stents  . Chronic rhinitis    ENT [place]  . COVID-19 virus infection 09/2019  . Diabetes mellitus type II   . Diverticulosis 06/2011   by CT and colonoscopy  . Fatty liver 06/2011   by CT  . GERD (gastroesophageal reflux disease)   . History of BPH   . History of shingles   . HLD (hyperlipidemia)   . HTN (hypertension)   . Ischemic  heart disease   . Melanoma ([place]) 12/2015   L forearm, scalp  . MI (myocardial infarction) ([place]) 1995  . Shingles rash 09/26/2012  . Sialadenitis 05/01/2017    Patient Active Problem List   Diagnosis Date Noted  . Right hand pain 08/18/2019  . Tremor 08/14/2018  . Urinary urgency 03/11/2017  . Peripheral neuropathy 10/05/2015  . Advanced care planning/counseling discussion 06/26/2014  . Stress 08/09/2013  . BPH with obstruction/lower urinary tract symptoms   . Medicare annual wellness visit, subsequent 03/25/2011  . S/P CABG (coronary artery bypass graft) 06/07/2010  . HTN (hypertension)   . Hyperlipidemia associated with type 2 diabetes mellitus ([place])   . Type 2 diabetes, controlled, with peripheral neuropathy ([place])   . GERD (gastroesophageal reflux disease)   . Chronic rhinitis   . Vitamin D deficiency 04/11/2008  . Insomnia 03/14/2008  . Overweight with body mass index (BMI) 25.0-29.9 07/03/2006  . ERECTILE DYSFUNCTION 07/01/2006  . Coronary atherosclerosis 07/01/2006  . IRRITABLE BOWEL SYNDROME 07/01/2006    Past Surgical History:  Procedure Laterality Date  . ANKLE SURGERY  03/2002   Left ankle fracture  . CARDIAC CATHETERIZATION    . CHOLECYSTECTOMY  1985  . COLONOSCOPY     diverticulosis, tortuous colon with looping [name])  . CORONARY ARTERY BYPASS GRAFT  05/1993   x6 MI Attemped angioplasty  . CORONARY STENT PLACEMENT  07/25/2009   Multi-Link VISION Cobalt chromium   . CYSTOSCOPY  10/03/08   Normal-Dr. Bernardo Heater  . INGUINAL HERNIA REPAIR  11/2000   and ventral hernia repair    Prior to Admission medications   Medication Sig Start Date End Date Taking? Authorizing Provider  Alpha-Lipoic Acid 600 MG CAPS Take 1 capsule (600 mg total) by mouth 2 (two) times daily. 06/26/16   Ria Bush, MD  amLODipine (NORVASC) 5 MG tablet TAKE 1 TABLET BY MOUTH EVERY DAY 09/14/19   Ria Bush, MD  aspirin 81 MG tablet Take 81 mg by mouth daily.      [provider]  benzonatate (TESSALON) 200 MG capsule Take 1 capsule (200 mg total) by mouth 3 (three) times daily as needed for cough. 10/08/19   Coral Spikes, DO  Blood Glucose Monitoring Suppl ([place]) w/Device KIT 1 Device by Does not apply route daily. Test blood sugar once daily and as directed. Dx W10.27 04/10/18   Ria Bush, MD  Cholecalciferol (VITAMIN D3) 1000 UNITS CAPS Take 1 capsule (1,000 Units total) by mouth daily. Patient taking differently: Take 1 capsule by mouth as needed.  12/14/14   Ria Bush, MD  Cranberry 500 MG CAPS Take 2 capsules (1,000 mg total) by mouth daily. 02/03/19   Ria Bush, MD  diclofenac Sodium (VOLTAREN) 1 % GEL Apply 2 g topically 3 (three) times daily. 08/18/19   Ria Bush, MD  finasteride (PROSCAR) 5 MG tablet TAKE 1 TABLET BY MOUTH EVERY DAY 10/15/19   Ria Bush, MD  fluticasone [facility]) 50 MCG/ACT nasal spray Place 2 sprays into both nostrils daily. Patient taking differently: Place 2 sprays into both nostrils daily. As needed 09/04/14   Jan Fireman, PA-C  furosemide (LASIX) 20 MG tablet TAKE 1 TABLET BY MOUTH EVERY DAY 10/22/19   Ria Bush, MD  gabapentin (NEURONTIN) 300 MG capsule TAKE 2 CAPSULES BY MOUTH TWICE A DAY 11/10/19   Ria Bush, MD  hydrochlorothiazide (HYDRODIURIL) 25 MG tablet TAKE 1 TABLET BY MOUTH EVERY DAY 09/30/19   Ria Bush, MD  ipratropium (ATROVENT) 0.06 % nasal spray Place 2 sprays into both nostrils 4 (four) times daily as needed for rhinitis. 10/08/19   Coral Spikes, DO  losartan (COZAAR) 25 MG tablet TAKE 1 TABLET BY MOUTH EVERY DAY 09/21/18   Ria Bush, MD  metFORMIN (GLUCOPHAGE) 500 MG tablet TAKE 1 TABLET (500 MG TOTAL) BY MOUTH 2 (TWO) TIMES DAILY WITH A MEAL. 08/30/19   Ria Bush, MD  Multiple Vitamin (MULTIVITAMIN) tablet Take 1 tablet by mouth daily.    [provider]  OneTouch Delica Lancets 25D MISC CHECK BLOOD SUGAR ONCE  DAILY AND AS INSTRUCTED. DX E11.42 08/30/19   Ria Bush, MD  [facility] VERIO test strip USE TO TEST BLOOD SUGAR ONCE DAILY 08/30/19   Ria Bush, MD  oxyCODONE-acetaminophen (PERCOCET) 5-325 MG tablet Take 2 tablets by mouth every 6 (six) hours as needed for severe pain. 12/18/19   Hinda Kehr, MD  pravastatin (PRAVACHOL) 40 MG tablet TAKE 1 TABLET BY MOUTH EVERYDAY AT BEDTIME 08/20/19   Ria Bush, MD  traZODone (DESYREL) 50 MG tablet TAKE 1 TO 1 AND 1/2 TABLETS BY MOUTH AT BEDTIME AS NEEDED FOR SLEEP 11/17/19   Ria Bush, MD  valACYclovir (VALTREX) 1000 MG tablet Take 2 tablets (2,000 mg total) by mouth 2 (two) times daily. For 1 day 02/03/19  Ria Bush, MD  vitamin B-12 (CYANOCOBALAMIN) 250 MCG tablet Take 250 mcg by mouth once a week.     [provider]    Allergies Morphine sulfate, Nitroglycerin, and Contrast media [iodinated diagnostic agents]  Family History  Problem Relation Age of Onset  . Kidney failure Mother   . Coronary artery disease Mother   . Heart failure Mother   . Hypertension Brother   . Heart attack Brother   . Alcohol abuse Brother   . Hypertension Brother   . Hypertension Sister     Social History Social History   Tobacco Use  . Smoking status: Never Smoker  . Smokeless tobacco: Never Used  Vaping Use  . Vaping Use: Never used  Substance Use Topics  . Alcohol use: No    Alcohol/week: 0.0 standard drinks  . Drug use: No    Review of Systems Constitutional: No fever/chills Eyes: No visual changes. ENT: No sore throat. Cardiovascular: Denies chest pain. Respiratory: Denies shortness of breath. Gastrointestinal: No abdominal pain.  No nausea, no vomiting.  No diarrhea.  No constipation. Genitourinary: Negative for dysuria. Musculoskeletal: Acute onset sharp left lower leg pain Integumentary: Negative for rash. Neurological: Negative for headaches, focal weakness or  numbness.   ____________________________________________   PHYSICAL EXAM:  VITAL SIGNS: ED Triage Vitals  Enc Vitals Group     BP 12/18/19 0235 (!) 171/86     Pulse Rate 12/18/19 0235 69     Resp 12/18/19 0235 18     Temp 12/18/19 0235 98.2 F (36.8 C)     Temp Source 12/18/19 0235 Oral     SpO2 12/18/19 0235 97 %     Weight 12/18/19 0236 78 kg (172 lb)     Height 12/18/19 0236 1.753 m ($Remove'5\' 9"'xCOyuEM$ )     Head Circumference --      Peak Flow --      Pain Score 12/18/19 0235 10     Pain Loc --      Pain Edu? --      Excl. in [place]? --     Constitutional: Alert and oriented.  Eyes: Conjunctivae are normal.  Head: Atraumatic. Nose: No congestion/rhinnorhea. Mouth/Throat: Patient is wearing a mask. Neck: No stridor.  No meningeal signs.   Cardiovascular: Normal rate, regular rhythm. Good peripheral circulation. Grossly normal heart sounds. Respiratory: Normal respiratory effort.  No retractions. Gastrointestinal: Soft and nontender. No distention.  Musculoskeletal: No gross deformities of his extremities including the left lower leg at the site of his pain.  He describes an area several centimeters superior to his medial malleolus.  It is along the line of an old well-healed surgical scar from a bypass surgery.  There is no swelling, no induration, no erythema, and no significant tenderness to palpation at this time.  Compartments are soft and easily compressible.  No gross difference in appearance between left and right leg. Neurologic:  Normal speech and language. No gross focal neurologic deficits are appreciated.  Skin:  Skin is warm, dry and intact. Psychiatric: Mood and affect are normal. Speech and behavior are normal.  ____________________________________________   LABS (all labs ordered are listed, but only abnormal results are displayed)  Labs Reviewed - No data to display ____________________________________________  EKG  None - EKG not ordered by ED  physician ____________________________________________  RADIOLOGY Ursula Alert, personally viewed and evaluated these images (plain radiographs) as part of my medical decision making, as well as reviewing the written report by the radiologist.  ED  MD interpretation: No DVT identified on ultrasound  Official radiology report(s): US Venous Img Lower Unilateral Left  Result Date: 12/18/2019 CLINICAL DATA:  Left leg pain x1 day. EXAM: LEFT LOWER EXTREMITY VENOUS DOPPLER ULTRASOUND TECHNIQUE: Gray-scale sonography with compression, as well as color and duplex ultrasound, were performed to evaluate the deep venous system(s) from the level of the common femoral vein through the popliteal and proximal calf veins. COMPARISON:  None. FINDINGS: VENOUS Normal compressibility of the LEFT common femoral, superficial femoral, and popliteal veins, as well as the visualized calf veins. Visualized portions of LEFTR profunda femoral vein and LEFT great saphenous vein unremarkable. No filling defects to suggest DVT on grayscale or color Doppler imaging. Doppler waveforms show normal direction of venous flow, normal respiratory plasticity and response to augmentation. Limited views of the contralateral common femoral vein are unremarkable. OTHER None. Limitations: none IMPRESSION: Negative. Electronically Signed   By: Virgina Norfolk M.D.   On: 12/18/2019 03:08    ____________________________________________   PROCEDURES   Procedure(s) performed (including Critical Care):  Procedures   ____________________________________________   INITIAL IMPRESSION / MDM / [place] / ED COURSE  As part of my medical decision making, I reviewed the following data within the [place] notes reviewed and incorporated, Old chart reviewed, Notes from prior ED visits and [place] Controlled Substance Database   Differential diagnosis includes, but is not limited to, DVT, muscle cramp,  nonspecific muscle strain, compartment syndrome, acute infection, abscess.  The patient has an essentially normal physical exam.  The pain is beneath a well-healed surgical scar from a bypass harvest site, but is unclear whether this is clinically relevant.  He has never had similar symptoms and his ultrasound is negative for DVT.  He is feeling better with Percocet for analgesia.  There is no evidence of an emergent medical condition at this time and he feels reassured that he does not have a DVT.  I do not feel that there is a role for imaging given that he is had no trauma and there is no gross deformity on physical exam.  He is neurologically intact and at his baseline with chronic neuropathy.  He is comfortable with the plan for prescription for Percocet and outpatient follow-up.  I gave my usual and customary return precautions.           ____________________________________________  FINAL CLINICAL IMPRESSION(S) / ED DIAGNOSES  Final diagnoses:  Left leg pain     MEDICATIONS GIVEN DURING THIS VISIT:  Medications  oxyCODONE-acetaminophen (PERCOCET/ROXICET) 5-325 MG per tablet 2 tablet (2 tablets Oral Given 12/18/19 0245)  ketorolac (TORADOL) 30 MG/ML injection 15 mg (15 mg Intramuscular Given 12/18/19 0523)     ED Discharge Orders         Ordered    oxyCODONE-acetaminophen (PERCOCET) 5-325 MG tablet  Every 6 hours PRN        12/18/19 0519          *Please note:  Aaron Stafford was evaluated in Emergency Department on 12/18/2019 for the symptoms described in the history of present illness. He was evaluated in the context of the global COVID-19 pandemic, which necessitated consideration that the patient might be at risk for infection with the SARS-CoV-2 virus that causes COVID-19. Institutional protocols and algorithms that pertain to the evaluation of patients at risk for COVID-19 are in a state of rapid change based on information released by regulatory bodies including the  CDC and federal and state organizations.  These policies and algorithms were followed during the patient's care in the ED.  Some ED evaluations and interventions may be delayed as a result of limited staffing during and after the pandemic.*  Note:  This document was prepared using Dragon voice recognition software and may include unintentional dictation errors.   Hinda Kehr, MD 12/18/19 [phone]

## 2019-12-25 ENCOUNTER — Other Ambulatory Visit: Payer: Self-pay | Admitting: Family Medicine

## 2020-01-25 DIAGNOSIS — Z23 Encounter for immunization: Secondary | ICD-10-CM | POA: Diagnosis not present

## 2020-02-01 ENCOUNTER — Ambulatory Visit
Admission: RE | Admit: 2020-02-01 | Discharge: 2020-02-01 | Disposition: A | Discharge status: Home / Self Care | Payer: Medicare Other | Source: Ambulatory Visit | Attending: Unknown Physician Specialty | Admitting: Unknown Physician Specialty

## 2020-02-01 ENCOUNTER — Other Ambulatory Visit: Payer: Self-pay

## 2020-02-01 DIAGNOSIS — K118 Other diseases of salivary glands: Secondary | ICD-10-CM

## 2020-02-01 DIAGNOSIS — R221 Localized swelling, mass and lump, neck: Secondary | ICD-10-CM | POA: Diagnosis not present

## 2020-02-01 IMAGING — US US SOFT TISSUE HEAD/NECK
1 series · 9 of 9 positions shown · non-contrast
Comparison: Ultrasound [DATE]

CLINICAL DATA: Right submandibular mass.

EXAM:
ULTRASOUND OF HEAD/NECK SOFT TISSUES
TECHNIQUE: Ultrasound examination of the head and neck soft tissues was
performed in the area of clinical concern.

[Series 1: us soft tissue head/neck · 0.07mm/px · 9 acquisitions, 9 frames shown]
[im 1/9]
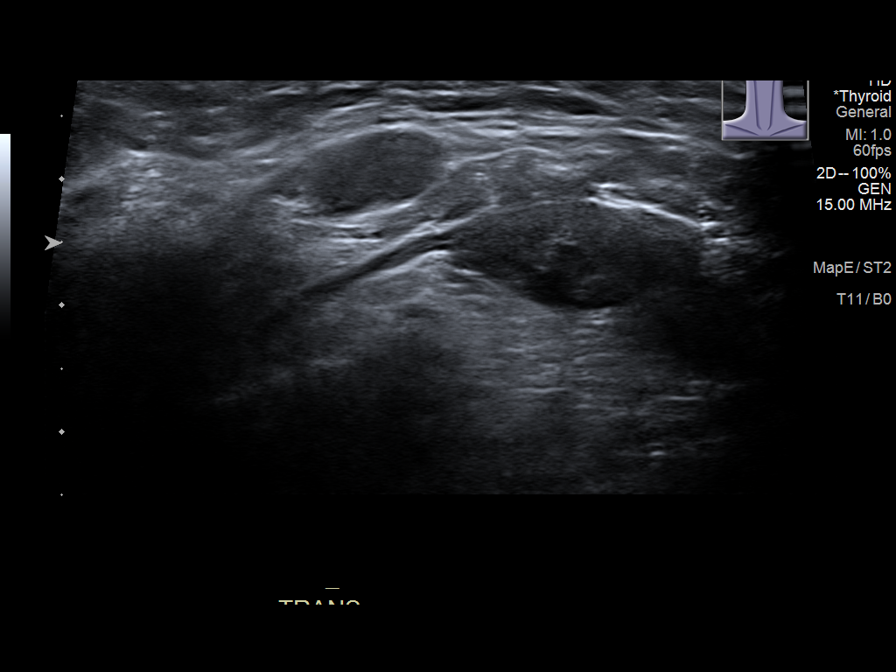
[im 2/9]
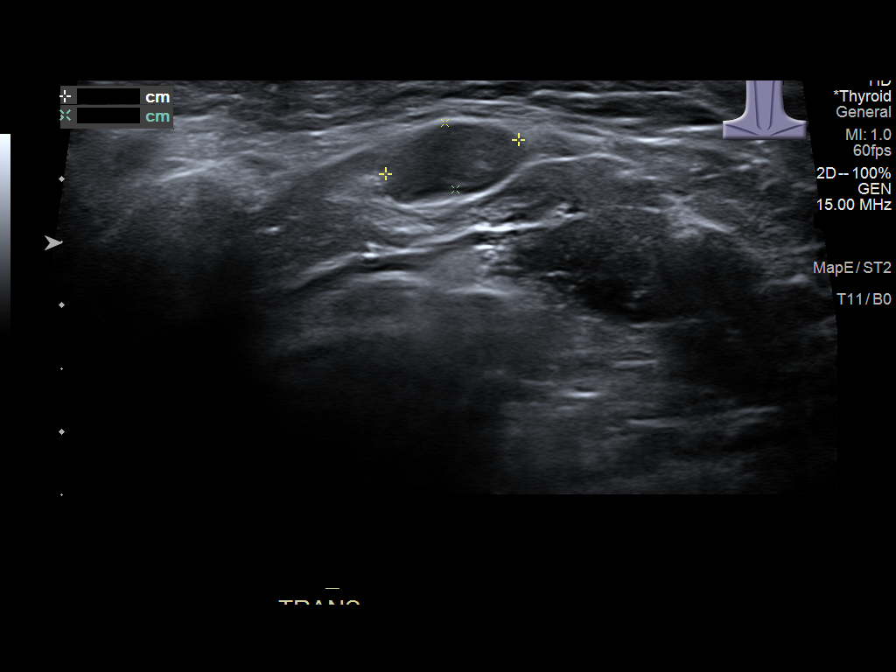
[im 3/9]
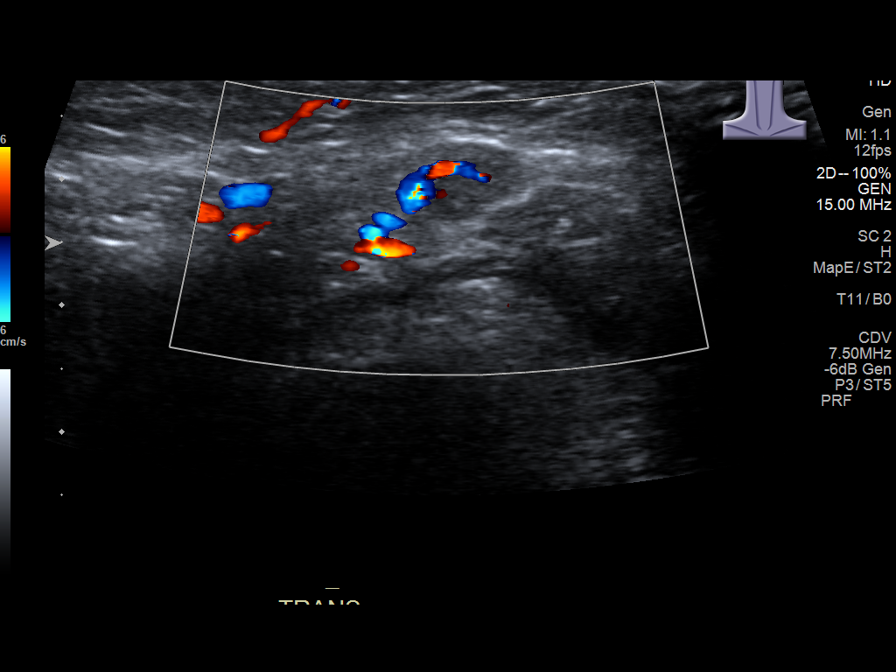
[im 4/9]
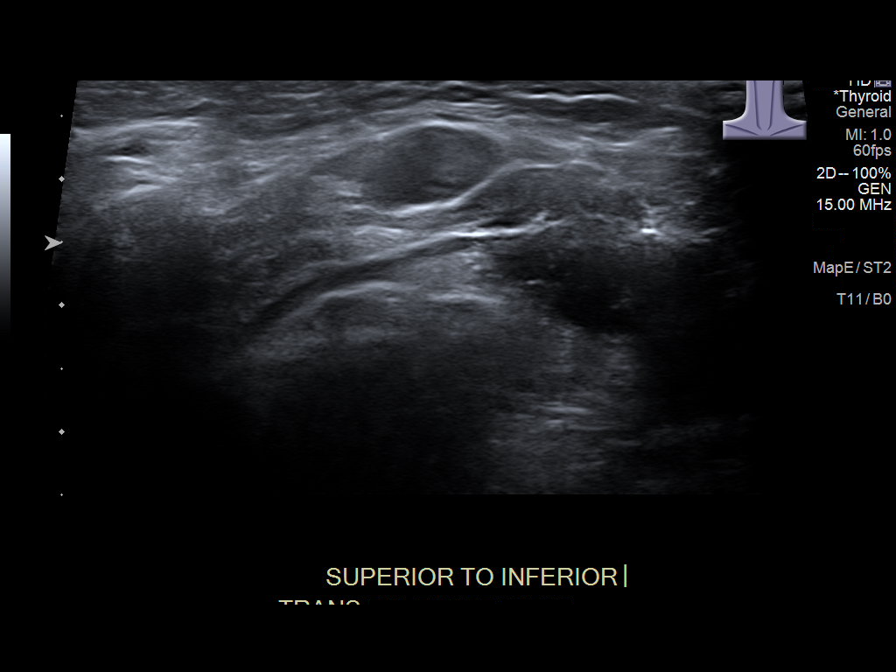
[im 5/9]
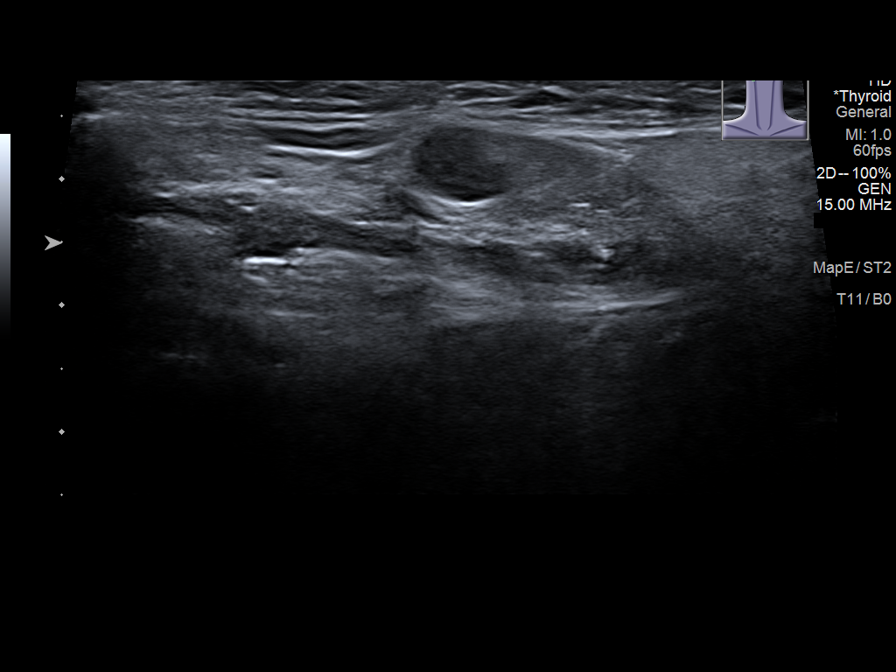
[im 6/9]
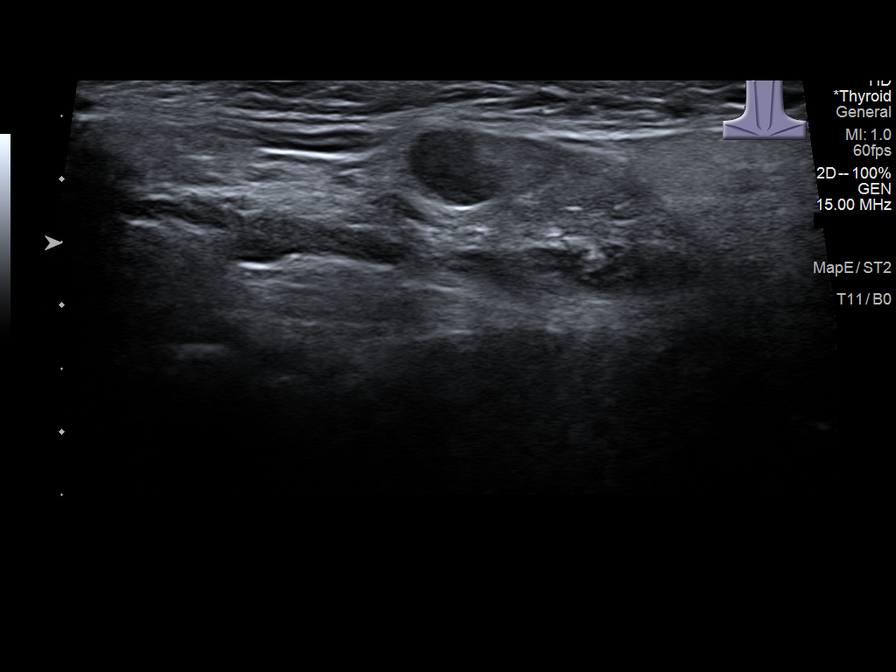
[im 7/9]
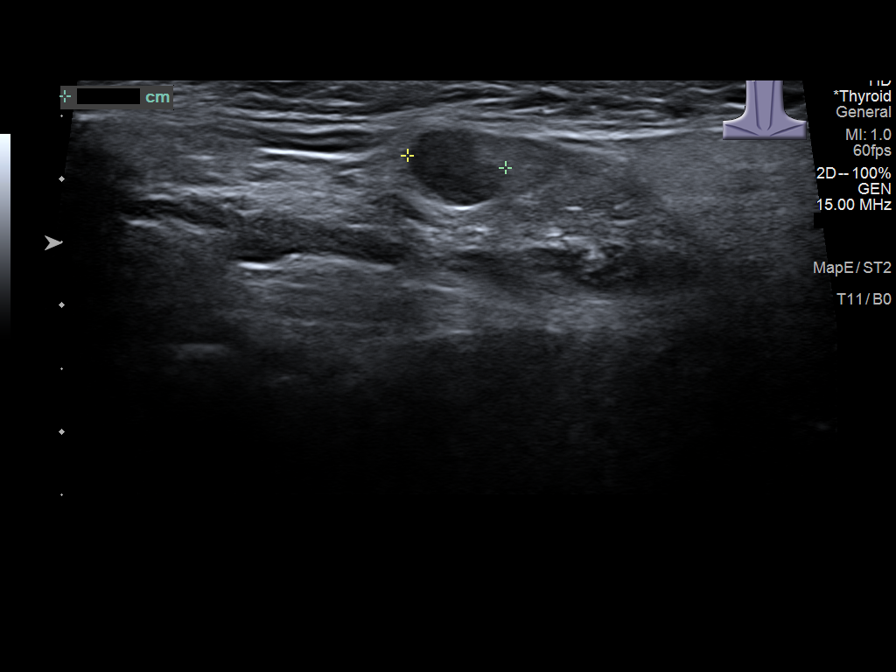
[im 8/9]
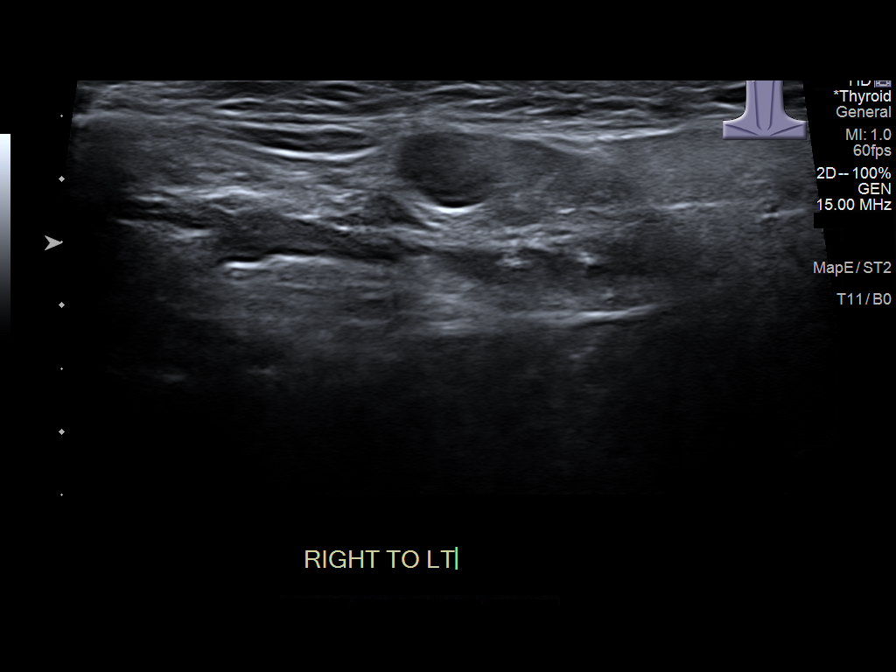
[im 9/9]
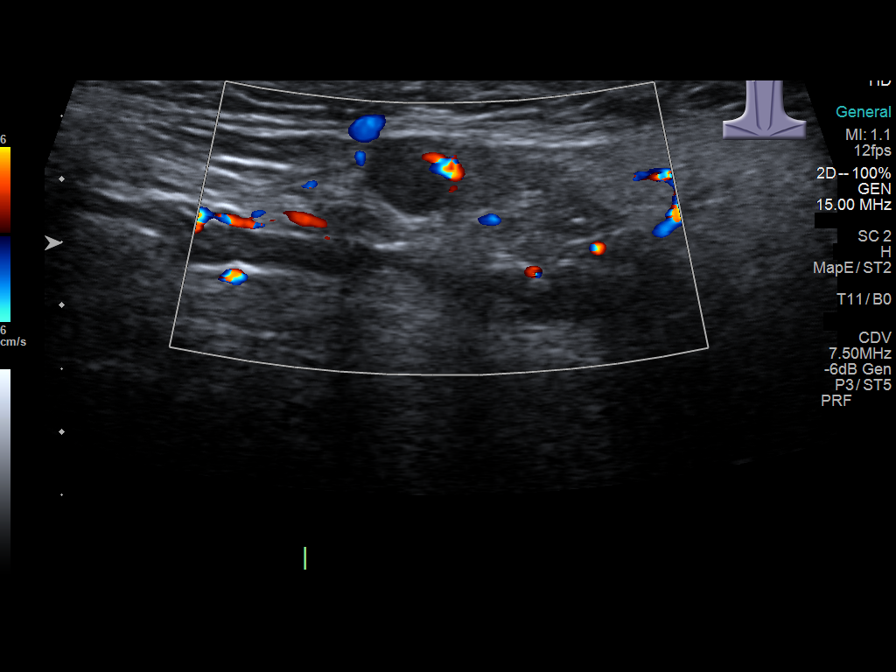

[9 of 9 positions shown; findings below may reference images not displayed]

FINDINGS: Palpable abnormality in the right submandibular region corresponds
to a hypoechoic nodule the subcutaneous tissues compatible with a
lymph node. This has homogeneous hypoechoic echogenicity and
measures 11 x 8 x 5 mm similar to the prior study. No other cystic
or solid mass identified.
IMPRESSION: Right submandibular lymph node is stable from the prior ultrasound.

## 2020-02-23 ENCOUNTER — Ambulatory Visit (INDEPENDENT_AMBULATORY_CARE_PROVIDER_SITE_OTHER): Payer: Medicare Other | Admitting: Family Medicine

## 2020-02-23 ENCOUNTER — Other Ambulatory Visit: Payer: Self-pay

## 2020-02-23 ENCOUNTER — Encounter: Payer: Self-pay | Admitting: Family Medicine

## 2020-02-23 VITALS — BP 158/68 | HR 61 | Temp 96.3°F | Ht 66.0 in | Wt 174.2 lb

## 2020-02-23 DIAGNOSIS — I10 Essential (primary) hypertension: Secondary | ICD-10-CM

## 2020-02-23 DIAGNOSIS — Z23 Encounter for immunization: Secondary | ICD-10-CM | POA: Diagnosis not present

## 2020-02-23 DIAGNOSIS — G6289 Other specified polyneuropathies: Secondary | ICD-10-CM | POA: Diagnosis not present

## 2020-02-23 DIAGNOSIS — E1142 Type 2 diabetes mellitus with diabetic polyneuropathy: Secondary | ICD-10-CM

## 2020-02-23 LAB — POCT GLYCOSYLATED HEMOGLOBIN (HGB A1C): Hemoglobin A1C: 6.3 % — AB (ref 4.0–5.6)

## 2020-02-23 NOTE — Progress Notes (Signed)
Patient ID: Aaron Stafford, male    DOB: 1935/02/03, 86 y.o.   MRN: [id number]  This visit was conducted in person.  BP (!) 158/68 (BP Location: Right Arm, Cuff Size: Large)   Pulse 61   Temp (!) 96.3 F (35.7 C) (Temporal)   Ht 5\' 6"  (1.676 m)   Wt 174 lb 3.2 oz (79 kg)   SpO2 97%   BMI 28.12 kg/m    CC: 6 mo f/u visit  Subjective:   HPI: Aaron Stafford is a 86 y.o. male presenting on 02/23/2020 for Follow-up (X6 mos)   Suffered COVID 09/2019 s/p mAb infusion. Fully recovered.  Marked weight loss over the past few years - intentional.   HTN - Compliant with current antihypertensive regimen of amlodipine 5mg  daily, hctz 25mg  daily, losartan 25mg  daily. Also on lasix 20mg  daily. Does not regularly check blood pressures at home. No low blood pressure readings or symptoms of dizziness/syncope. Denies HA, vision changes, CP/tightness, SOB, leg swelling.   DM - does not regularly check sugars. Compliant with antihyperglycemic regimen which includes: metformin 500mg  bid. Denies low sugars or hypoglycemic symptoms. Known neuropathy on gabapentin 600mg  bid and alpha lipoic acid. Last diabetic eye exam 11/2019. Pneumovax: 2006. Prevnar: 2015. Glucometer brand: onetouch. DSME: declined. Lab Results  Component Value Date   HGBA1C 6.3 (A) 02/23/2020   Diabetic Foot Exam - Simple   Simple Foot Form Diabetic Foot exam was performed with the following findings: Yes 02/23/2020  9:25 AM  Visual Inspection No deformities, no ulcerations, no other skin breakdown bilaterally: Yes Sensation Testing See comments: Yes Pulse Check Posterior Tibialis and Dorsalis pulse intact bilaterally: Yes Comments Diminished sensation to monofilament testing to left sole    Lab Results  Component Value Date   MICROALBUR 3.4 (H) 02/21/2014         Relevant past medical, surgical, family and social history reviewed and updated as indicated. Interim medical history since our last visit reviewed. Allergies  and medications reviewed and updated. Outpatient Medications Prior to Visit  Medication Sig Dispense Refill  . Alpha-Lipoic Acid 600 MG CAPS Take 1 capsule (600 mg total) by mouth 2 (two) times daily.    Marland Kitchen amLODipine (NORVASC) 5 MG tablet TAKE 1 TABLET BY MOUTH EVERY DAY 90 tablet 3  . aspirin 81 MG tablet Take 81 mg by mouth daily.    . Blood Glucose Monitoring Suppl ([place]) w/Device KIT 1 Device by Does not apply route daily. Test blood sugar once daily and as directed. Dx E11.42 1 kit 0  . Cholecalciferol (VITAMIN D3) 1000 UNITS CAPS Take 1 capsule (1,000 Units total) by mouth daily. (Patient taking differently: Take 1 capsule by mouth as needed.) 30 capsule   . Cranberry 500 MG CAPS Take 2 capsules (1,000 mg total) by mouth daily.    . diclofenac Sodium (VOLTAREN) 1 % GEL Apply 2 g topically 3 (three) times daily.    . finasteride (PROSCAR) 5 MG tablet TAKE 1 TABLET BY MOUTH EVERY DAY 90 tablet 2  . fluticasone (FLONASE) 50 MCG/ACT nasal spray Place 2 sprays into both nostrils daily. (Patient taking differently: Place 2 sprays into both nostrils daily. As needed) 16 g 0  . furosemide (LASIX) 20 MG tablet TAKE 1 TABLET BY MOUTH EVERY DAY 90 tablet 3  . gabapentin (NEURONTIN) 300 MG capsule TAKE 2 CAPSULES BY MOUTH TWICE A DAY 360 capsule 1  . hydrochlorothiazide (HYDRODIURIL) 25 MG tablet TAKE 1 TABLET BY  MOUTH EVERY DAY 90 tablet 3  . ipratropium (ATROVENT) 0.06 % nasal spray Place 2 sprays into both nostrils 4 (four) times daily as needed for rhinitis. 15 mL 0  . losartan (COZAAR) 25 MG tablet TAKE 1 TABLET BY MOUTH EVERY DAY 90 tablet 3  . metFORMIN (GLUCOPHAGE) 500 MG tablet TAKE 1 TABLET (500 MG TOTAL) BY MOUTH 2 (TWO) TIMES DAILY WITH A MEAL. 180 tablet 1  . Multiple Vitamin (MULTIVITAMIN) tablet Take 1 tablet by mouth daily.    Glory Rosebush Delica Lancets 78M MISC CHECK BLOOD SUGAR ONCE DAILY AND AS INSTRUCTED. DX E11.42 100 each 3  . ONETOUCH VERIO test strip USE TO  TEST BLOOD SUGAR ONCE DAILY 100 strip 3  . oxyCODONE-acetaminophen (PERCOCET) 5-325 MG tablet Take 2 tablets by mouth every 6 (six) hours as needed for severe pain. 16 tablet 0  . pravastatin (PRAVACHOL) 40 MG tablet TAKE 1 TABLET BY MOUTH EVERYDAY AT BEDTIME 90 tablet 3  . traZODone (DESYREL) 50 MG tablet TAKE 1 TO 1 AND 1/2 TABLETS BY MOUTH AT BEDTIME AS NEEDED FOR SLEEP 180 tablet 1  . valACYclovir (VALTREX) 1000 MG tablet Take 2 tablets (2,000 mg total) by mouth 2 (two) times daily. For 1 day 4 tablet 3  . vitamin B-12 (CYANOCOBALAMIN) 250 MCG tablet Take 250 mcg by mouth once a week.     . benzonatate (TESSALON) 200 MG capsule Take 1 capsule (200 mg total) by mouth 3 (three) times daily as needed for cough. 30 capsule 0   No facility-administered medications prior to visit.     Per HPI unless specifically indicated in ROS section below Review of Systems Objective:  BP (!) 158/68 (BP Location: Right Arm, Cuff Size: Large)   Pulse 61   Temp (!) 96.3 F (35.7 C) (Temporal)   Ht $R'5\' 6"'nT$  (1.676 m)   Wt 174 lb 3.2 oz (79 kg)   SpO2 97%   BMI 28.12 kg/m   Wt Readings from Last 3 Encounters:  02/23/20 174 lb 3.2 oz (79 kg)  12/18/19 172 lb (78 kg)  10/08/19 171 lb (77.6 kg)      Physical Exam Vitals and nursing note reviewed.  Constitutional:      General: He is not in acute distress.    Appearance: He is well-developed and well-nourished.  HENT:     Head: Normocephalic and atraumatic.     Right Ear: External ear normal.     Left Ear: External ear normal.     Nose: Nose normal.     Mouth/Throat:     Mouth: Oropharynx is clear and moist.     Pharynx: No oropharyngeal exudate.  Eyes:     General: No scleral icterus.    Extraocular Movements: EOM normal.     Conjunctiva/sclera: Conjunctivae normal.     Pupils: Pupils are equal, round, and reactive to light.  Cardiovascular:     Rate and Rhythm: Normal rate and regular rhythm.     Pulses: Intact distal pulses.     Heart  sounds: Normal heart sounds. No murmur heard.   Pulmonary:     Effort: Pulmonary effort is normal. No respiratory distress.     Breath sounds: Normal breath sounds. No wheezing or rales.     Comments: RLL crackles Musculoskeletal:        General: No edema.     Cervical back: Normal range of motion and neck supple.     Comments: See HPI for foot exam if done  Lymphadenopathy:     Cervical: No cervical adenopathy.  Skin:    General: Skin is warm and dry.     Findings: No rash.  Psychiatric:        Mood and Affect: Mood and affect normal.       Results for orders placed or performed in visit on 02/23/20  POCT glycosylated hemoglobin (Hb A1C)  Result Value Ref Range   Hemoglobin A1C 6.3 (A) 4.0 - 5.6 %   HbA1c POC (<> result, manual entry)     HbA1c, POC (prediabetic range)     HbA1c, POC (controlled diabetic range)     Lab Results  Component Value Date   CREATININE 1.10 10/08/2019   BUN 25 (H) 10/08/2019   NA 135 10/08/2019   K 4.5 10/08/2019   CL 97 (L) 10/08/2019   CO2 27 10/08/2019    Assessment & Plan:  This visit occurred during the SARS-CoV-2 public health emergency.  Safety protocols were in place, including screening questions prior to the visit, additional usage of staff PPE, and extensive cleaning of exam room while observing appropriate contact time as indicated for disinfecting solutions.   Problem List Items Addressed This Visit    Type 2 diabetes, controlled, with peripheral neuropathy ([place]) - Primary    Chronic, stable period on metformin 500mg  bid - continue this. A1c well controlled.  Marked neuropathy - continue gabapentin 600mg  bid and alpha lipoic acid      Relevant Orders   POCT glycosylated hemoglobin (Hb A1C) (Completed)   Peripheral neuropathy    Presumed diabetic      HTN (hypertension)    Chronic, BP elevated today although normally well controlled. I have asked him to monitor BP at home and call us in 1 week with readings to determine  need to titrate ARB.           No orders of the defined types were placed in this encounter.  Orders Placed This Encounter  Procedures  . POCT glycosylated hemoglobin (Hb A1C)    Patient Instructions  Start monitoring blood pressures at home as it was elevated today.  If staying >140/90 let me know to consider increased dose of losartan.  Continue current medicines otherwise.  Ok to go ahead and get COVID booster shot.  You are doing well today  Return as needed or in 6 months for medicare wellness visit   Follow up plan: Return in about 6 months (around 08/22/2020) for medicare wellness visit.  Ria Bush, MD

## 2020-02-23 NOTE — Patient Instructions (Addendum)
Start monitoring blood pressures at home as it was elevated today.  If staying >140/90 let me know to consider increased dose of losartan.  Continue current medicines otherwise.  Ok to go ahead and get COVID booster shot.  You are doing well today  Return as needed or in 6 months for medicare wellness visit

## 2020-02-23 NOTE — Assessment & Plan Note (Addendum)
Chronic, stable period on metformin 500mg  bid - continue this. A1c well controlled.  Marked neuropathy - continue gabapentin 600mg  bid and alpha lipoic acid

## 2020-02-23 NOTE — Assessment & Plan Note (Signed)
Presumed diabetic

## 2020-02-23 NOTE — Assessment & Plan Note (Signed)
Chronic, BP elevated today although normally well controlled. I have asked him to monitor BP at home and call us in 1 week with readings to determine need to titrate ARB.

## 2020-02-28 ENCOUNTER — Other Ambulatory Visit: Payer: Self-pay | Admitting: Family Medicine

## 2020-02-28 DIAGNOSIS — L570 Actinic keratosis: Secondary | ICD-10-CM | POA: Diagnosis not present

## 2020-02-28 DIAGNOSIS — Z86018 Personal history of other benign neoplasm: Secondary | ICD-10-CM | POA: Diagnosis not present

## 2020-02-28 DIAGNOSIS — Z872 Personal history of diseases of the skin and subcutaneous tissue: Secondary | ICD-10-CM | POA: Diagnosis not present

## 2020-02-28 DIAGNOSIS — Z859 Personal history of malignant neoplasm, unspecified: Secondary | ICD-10-CM | POA: Diagnosis not present

## 2020-02-28 DIAGNOSIS — L578 Other skin changes due to chronic exposure to nonionizing radiation: Secondary | ICD-10-CM | POA: Diagnosis not present

## 2020-02-28 DIAGNOSIS — L720 Epidermal cyst: Secondary | ICD-10-CM | POA: Diagnosis not present

## 2020-02-28 DIAGNOSIS — Z85820 Personal history of malignant melanoma of skin: Secondary | ICD-10-CM | POA: Diagnosis not present

## 2020-02-28 DIAGNOSIS — L218 Other seborrheic dermatitis: Secondary | ICD-10-CM | POA: Diagnosis not present

## 2020-03-01 ENCOUNTER — Other Ambulatory Visit: Payer: Self-pay | Admitting: Family Medicine

## 2020-03-07 ENCOUNTER — Other Ambulatory Visit: Payer: Self-pay | Admitting: Family Medicine

## 2020-03-13 DIAGNOSIS — R221 Localized swelling, mass and lump, neck: Secondary | ICD-10-CM | POA: Diagnosis not present

## 2020-04-07 ENCOUNTER — Telehealth: Payer: Self-pay | Admitting: Family Medicine

## 2020-04-07 ENCOUNTER — Telehealth: Payer: Self-pay

## 2020-04-07 NOTE — Chronic Care Management (AMB) (Signed)
  Chronic Care Management   Note  04/07/2020 Name: JAHMAD PETRICH MRN: [id number] DOB: 07/21/1934  MALIKI GIGNAC is a 86 y.o. year old male who is a primary care patient of Ria Bush, MD. I reached out to Don Broach by phone today in response to a referral sent by Mr. Aodhan Scheidt Dobratz's PCP, Ria Bush, MD.   Mr. Ching was given information about Chronic Care Management services today including:  1. CCM service includes personalized support from designated clinical staff supervised by his physician, including individualized plan of care and coordination with other care providers 2. 24/7 contact phone numbers for assistance for urgent and routine care needs. 3. Service will only be billed when office clinical staff spend 20 minutes or more in a month to coordinate care. 4. Only one practitioner may furnish and bill the service in a calendar month. 5. The patient may stop CCM services at any time (effective at the end of the month) by phone call to the office staff.   Patient agreed to services and verbal consent obtained.   Follow up plan:   [place]

## 2020-04-07 NOTE — Telephone Encounter (Signed)
Contacted patient by telephone due to report of worsening neuropathy. This was reported to [employer] scheduler on 04/07/20.   Reports neuropathy has been progressively worse over past 6 months. 2-3 times a month he has unbearable pain at night. He does not have any symptoms during the day. Takes gabapentin 600 mg at 6 PM and 600 mg at 8 PM daily. Reports he is taking 6 tablets total (1800 mg) some nights and this does seem to help. Reports last night it was so bad he did not sleep until 5 AM after he took an additional 600 mg gabapentin and 400 mg Advil. Rarely takes Advil due to DI with aspirin. Denies next day drowsiness. Reports he is no longer taking alpha lipoic acid. Thinks it got lost in the shuffle. Will pick up from his pharmacy today. Recommend taking 600 mg once daily.  May need to increase gabapentin dosing.  Consult PCP to consider gabapentin 300 mg - take 2-3 capsules twice daily   B12 WNL 2017   Lab Results  Component Value Date   CREATININE 1.10 10/08/2019   BUN 25 (H) 10/08/2019   GFR 73.83 08/13/2019   GFRNONAA >60 10/08/2019   GFRAA >60 10/08/2019   NA 135 10/08/2019   K 4.5 10/08/2019   CALCIUM 9.5 10/08/2019   CO2 27 10/08/2019   Lab Results  Component Value Date/Time   HGBA1C 6.3 (A) 02/23/2020 09:17 AM   HGBA1C 6.5 08/13/2019 08:14 AM   HGBA1C 6.5 (A) 02/03/2019 09:10 AM   HGBA1C 6.9 (H) 08/07/2018 10:13 AM    [name], PharmD Clinical Pharmacist [facility] [phone]

## 2020-04-10 MED ORDER — GABAPENTIN 300 MG PO CAPS
600.0000 mg | ORAL_CAPSULE | Freq: Two times a day (BID) | ORAL | 1 refills | Status: DC
Start: 2020-04-10 — End: 2020-10-18

## 2020-04-10 NOTE — Telephone Encounter (Signed)
I'm glad he's restarting alpha lipoic acid hopeful this will help too.  Ok to increase gabapentin to 300mg  2-3 tab BID prn. Med list updated. Not sure which pharmacy to send to so left pended.

## 2020-04-10 NOTE — Chronic Care Management (AMB) (Addendum)
Contacted patient. Explained Dr. Danise Mina was ok with him increasing gabapentin to 300 mg 2-3 tablets twice daily. Patient verbalized understanding. Patient requested gabapentin be sent to [facility].    Follow-Up:  Pharmacist Review  [name], CPP notified  [name], [place] Pharmacy Assistant [phone]

## 2020-04-10 NOTE — Telephone Encounter (Signed)
Ria Comment, can you let patient know about gabapentin dose increase and confirm the pharmacy he would like it sent to?  Thanks

## 2020-04-18 DIAGNOSIS — L02811 Cutaneous abscess of head [any part, except face]: Secondary | ICD-10-CM | POA: Diagnosis not present

## 2020-05-10 ENCOUNTER — Telehealth: Payer: Self-pay | Admitting: Cardiovascular Disease

## 2020-05-10 ENCOUNTER — Telehealth (INDEPENDENT_AMBULATORY_CARE_PROVIDER_SITE_OTHER): Payer: Medicare Other | Admitting: Physician Assistant

## 2020-05-10 ENCOUNTER — Encounter: Payer: Self-pay | Admitting: Physician Assistant

## 2020-05-10 VITALS — BP 147/66 | HR 56 | Ht 66.0 in | Wt 171.0 lb

## 2020-05-10 DIAGNOSIS — Z951 Presence of aortocoronary bypass graft: Secondary | ICD-10-CM

## 2020-05-10 DIAGNOSIS — I10 Essential (primary) hypertension: Secondary | ICD-10-CM | POA: Diagnosis not present

## 2020-05-10 DIAGNOSIS — I25118 Atherosclerotic heart disease of native coronary artery with other forms of angina pectoris: Secondary | ICD-10-CM | POA: Diagnosis not present

## 2020-05-10 DIAGNOSIS — E1142 Type 2 diabetes mellitus with diabetic polyneuropathy: Secondary | ICD-10-CM

## 2020-05-10 NOTE — Progress Notes (Signed)
Virtual Visit via Video Note   This visit type was conducted due to national recommendations for restrictions regarding the COVID-19 Pandemic (e.g. social distancing) in an effort to limit this patient's exposure and mitigate transmission in our community.  Due to his co-morbid illnesses, this patient is at least at moderate risk for complications without adequate follow up.  This format is felt to be most appropriate for this patient at this time.  All issues noted in this document were discussed and addressed.  A limited physical exam was performed with this format.  Please refer to the patient's chart for his consent to telehealth for [facility].      Date:  05/10/2020   ID:  Aaron Stafford, DOB [birth year], MRN [id number]  Patient Location: Home Provider Location: Office/Clinic  PCP:  Ria Bush, MD  Cardiologist: Dr. Rockey Situ Electrophysiologist:  None   Evaluation Performed:  Follow-Up Visit  Chief Complaint:  86 y.o. male with history of CAD s/p bypass in 1995 with LIMA graft to LAD, vein graft to RCA, and vein graft to OM.  He has previous stent placement to the vein graft of RCA in June 2011.  Other past medical history includes hyperlipidemia, hypertension, kidney stones.  Amazed at  [place] after 51 years at the same church.   Chief Complaint  Patient presents with  . Annual Exam     History of Present Illness:    Aaron Stafford is a 86 y.o. male with PMH as above.  He has history of CAD s/p bypass in 1995 with LIMA graft to LAD, vein graft to RCA, and vein graft to OM.  He has previous stent placement to the vein graft of RCA in June 2011.  Other past medical history includes hyperlipidemia, hypertension, kidney stones.  He was last evaluated by Dr. Rockey Situ 05/05/2019.  At that time, he reported his blood pressure was elevated due to a long walk into the office.  He was helping his wife with bone healing in her leg.  No medication changes made or further ischemic  work-up at that time.  Today, 05/10/2020, he is evaluated via telemedicine visit.  He is very thankful for his life and that he has been doing so well since his bypass surgery.  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  He denies any chest pain.  He denies shortness of breath or dyspnea.  He states he is able to walk as far as desired without becoming short of breath.  He reports being able to walk at quite a good pace and without any difficulty.  He has always walked fast he states.  He denies any signs or symptoms of volume overload.  He states his legs do not have edema and are "rather quite small."  He reports blood pressure, heart rate, and weight well controlled.  His current weight is 171.  2 years ago, he was at 219 and his low was 167.  He reports maintaining a heart healthy and low-salt diet.  He has not used saltshaker in some time.  He does watch what he eats and occasionally will have some sugary cereal but otherwise eats very healthy.  He reports that he has remained active in the community.  Every 2 years, he goes to visit the [employer] community.  Overall, he is very pleased with his care.  He is very pleased with  his primary cardiologist, Dr. Rockey Situ.   The patient does not have symptoms concerning for COVID-19 infection (fever, chills, cough, or new shortness of breath).   Past Medical History:  Diagnosis Date  . CAD (coronary artery disease)    with stents  . Chronic rhinitis    ENT [facility]  . COVID-19 virus infection 09/2019  . Diabetes mellitus type II   . Diverticulosis 06/2011   by CT and colonoscopy  . Fatty liver 06/2011   by CT  . GERD (gastroesophageal reflux disease)   . History of BPH   . History of shingles   . HLD (hyperlipidemia)   . HTN (hypertension)   . Ischemic heart disease   . Melanoma ([place]) 12/2015   L forearm, scalp  . MI (myocardial infarction) ([place]) 1995  . Shingles rash 09/26/2012  .  Sialadenitis 05/01/2017   Past Surgical History:  Procedure Laterality Date  . ANKLE SURGERY  03/2002   Left ankle fracture  . CARDIAC CATHETERIZATION    . CHOLECYSTECTOMY  1985  . COLONOSCOPY     diverticulosis, tortuous colon with looping [name])  . CORONARY ARTERY BYPASS GRAFT  05/1993   x6 MI Attemped angioplasty  . CORONARY STENT PLACEMENT  07/25/2009   Multi-Link VISION Cobalt chromium   . CYSTOSCOPY  10/03/08   Normal-Dr. Bernardo Heater  . INGUINAL HERNIA REPAIR  11/2000   and ventral hernia repair     Current Meds  Medication Sig  . Alpha-Lipoic Acid 600 MG CAPS Take 1 capsule (600 mg total) by mouth 2 (two) times daily.  Marland Kitchen amLODipine (NORVASC) 5 MG tablet TAKE 1 TABLET BY MOUTH EVERY DAY  . aspirin 81 MG tablet Take 81 mg by mouth daily.  . Blood Glucose Monitoring Suppl ([place]) w/Device KIT 1 Device by Does not apply route daily. Test blood sugar once daily and as directed. Dx I29.79  . Cholecalciferol (VITAMIN D3) 1000 UNITS CAPS Take 1 capsule (1,000 Units total) by mouth daily. (Patient taking differently: Take 1 capsule by mouth as needed.)  . Cranberry 500 MG CAPS Take 2 capsules (1,000 mg total) by mouth daily.  . diclofenac Sodium (VOLTAREN) 1 % GEL Apply 2 g topically 3 (three) times daily.  . finasteride (PROSCAR) 5 MG tablet TAKE 1 TABLET BY MOUTH EVERY DAY  . fluticasone (FLONASE) 50 MCG/ACT nasal spray Place 2 sprays into both nostrils daily. (Patient taking differently: Place 2 sprays into both nostrils daily. As needed)  . furosemide (LASIX) 20 MG tablet TAKE 1 TABLET BY MOUTH EVERY DAY  . gabapentin (NEURONTIN) 300 MG capsule Take 2-3 capsules (600-900 mg total) by mouth in the morning and at bedtime.  . hydrochlorothiazide (HYDRODIURIL) 25 MG tablet TAKE 1 TABLET BY MOUTH EVERY DAY  . ipratropium (ATROVENT) 0.06 % nasal spray Place 2 sprays into both nostrils 4 (four) times daily as needed for rhinitis.  Marland Kitchen losartan (COZAAR) 25 MG tablet TAKE 1  TABLET BY MOUTH EVERY DAY  . metFORMIN (GLUCOPHAGE) 500 MG tablet TAKE 1 TABLET (500 MG TOTAL) BY MOUTH 2 (TWO) TIMES DAILY WITH A MEAL.  . Multiple Vitamin (MULTIVITAMIN) tablet Take 1 tablet by mouth daily.  Glory Rosebush Delica Lancets 89Q MISC CHECK BLOOD SUGAR ONCE DAILY AND AS INSTRUCTED. DX E11.42  . ONETOUCH VERIO test strip USE TO TEST BLOOD SUGAR ONCE DAILY  . oxyCODONE-acetaminophen (PERCOCET) 5-325 MG tablet Take 2 tablets by mouth every 6 (six) hours as needed for severe pain.  . pravastatin (PRAVACHOL) 40  MG tablet TAKE 1 TABLET BY MOUTH EVERYDAY AT BEDTIME  . traZODone (DESYREL) 50 MG tablet TAKE 1 TO 1 AND 1/2 TABLETS BY MOUTH AT BEDTIME AS NEEDED FOR SLEEP  . valACYclovir (VALTREX) 1000 MG tablet TAKE 2 TABLETS (2,000 MG TOTAL) BY MOUTH 2 (TWO) TIMES DAILY. FOR 1 DAY  . vitamin B-12 (CYANOCOBALAMIN) 250 MCG tablet Take 250 mcg by mouth once a week.      Allergies:   Morphine sulfate, Nitroglycerin, and Contrast media [iodinated diagnostic agents]   Social History   Tobacco Use  . Smoking status: Never Smoker  . Smokeless tobacco: Never Used  Vaping Use  . Vaping Use: Never used  Substance Use Topics  . Alcohol use: No    Alcohol/week: 0.0 standard drinks  . Drug use: No     Family Hx: The patient's family history includes Alcohol abuse in his brother; Coronary artery disease in his mother; Heart attack in his brother; Heart failure in his mother; Hypertension in his brother, brother, and sister; Kidney failure in his mother.  ROS:   Please see the history of present illness.    He denies chest pain, palpitations, dyspnea, pnd, orthopnea, n, v, dizziness, syncope, edema, weight gain, or early satiety.  All other systems reviewed and are negative.  Objective:    Vital Signs:  BP (!) 147/66   Pulse (!) 56   Ht $R'5\' 6"'BJ$  (1.676 m)   Wt 171 lb (77.6 kg)   BMI 27.60 kg/m    VITAL SIGNS:  reviewed  Accessory clinical findings reviewed:    EKG:  No ECG  reviewed.  Previous vitals reviewed today:    Temp Readings from Last 3 Encounters:  02/23/20 (!) 96.3 F (35.7 C) (Temporal)  12/18/19 98.2 F (36.8 C) (Oral)  10/10/19 97.9 F (36.6 C) (Oral)   BP Readings from Last 3 Encounters:  05/10/20 (!) 147/66  02/23/20 (!) 158/68  12/18/19 (!) 156/71   Pulse Readings from Last 3 Encounters:  05/10/20 (!) 56  02/23/20 61  12/18/19 (!) 54    Wt Readings from Last 3 Encounters:  05/10/20 171 lb (77.6 kg)  02/23/20 174 lb 3.2 oz (79 kg)  12/18/19 172 lb (78 kg)     Labs reviewed today:      Lab Results  Component Value Date   WBC 6.6 10/08/2019   HGB 14.9 10/08/2019   HCT 43.4 10/08/2019   MCV 88.9 10/08/2019   PLT 173 10/08/2019   Lab Results  Component Value Date   CREATININE 1.10 10/08/2019   BUN 25 (H) 10/08/2019   NA 135 10/08/2019   K 4.5 10/08/2019   CL 97 (L) 10/08/2019   CO2 27 10/08/2019   Lab Results  Component Value Date   ALT 25 10/08/2019   AST 32 10/08/2019   ALKPHOS 50 10/08/2019   BILITOT 1.0 10/08/2019   Lab Results  Component Value Date   CHOL 149 08/13/2019   HDL 36.80 (L) 08/13/2019   LDLCALC 49 08/05/2013   LDLDIRECT 75.0 08/13/2019   TRIG 242.0 (H) 08/13/2019   CHOLHDL 4 08/13/2019    Lab Results  Component Value Date   HGBA1C 6.3 (A) 02/23/2020   Lab Results  Component Value Date   TSH 1.61 10/05/2015     Prior CV Studies reviewed today:    The following studies were reviewed today:  All scans under CV studies Also arranged to provide records for CABG to pt  ASSESSMENT & PLAN:  Coronary artery disease s/p bypass --No symptoms concerning for angina.  No indication for further ischemic work-up at this time.  Continue current medications.  Continue current heart healthy diet and exercise.  As above, we were able to locate his bypass records and will send these to him for his review and per his request.  Essential hypertension --BP today slightly elevated but with  report that overall this is well controlled.  Continue current medications.  Continue to monitor BP at home.  Call the office if consistently elevated with SBP> 130s / 140s on a consistent basis.  DM2 with peripheral nephropathy --Ongoing glycemic control recommended.  Per PCP.  COVID-19 Education: The signs and symptoms of COVID-19 were discussed with the patient and how to seek care for testing (follow up with PCP or arrange E-visit).  The importance of social distancing was discussed today.  Time:   Today, I have spent 30 minutes with the patient with telehealth technology discussing the above problems.     Medication Adjustments/Labs and Tests Ordered: Current medicines are reviewed at length with the patient today.  Concerns regarding medicines are outlined above.    Disposition: In Person in 1 year(s) (6 months to 1 year)  Signed, Magda Bernheim  05/10/2020  [facility]

## 2020-05-10 NOTE — Telephone Encounter (Signed)
  Patient Consent for Virtual Visit         Aaron Stafford has provided verbal consent on 05/10/2020 for a virtual visit (video or telephone).   CONSENT FOR VIRTUAL VISIT FOR:  Aaron Stafford  By participating in this virtual visit I agree to the following:  I hereby voluntarily request, consent and authorize [facility] and its employed or contracted physicians, physician assistants, nurse practitioners or other licensed health care professionals (the Practitioner), to provide me with telemedicine health care services (the "Services") as deemed necessary by the treating Practitioner. I acknowledge and consent to receive the Services by the Practitioner via telemedicine. I understand that the telemedicine visit will involve communicating with the Practitioner through live audiovisual communication technology and the disclosure of certain medical information by electronic transmission. I acknowledge that I have been given the opportunity to request an in-person assessment or other available alternative prior to the telemedicine visit and am voluntarily participating in the telemedicine visit.  I understand that I have the right to withhold or withdraw my consent to the use of telemedicine in the course of my care at any time, without affecting my right to future care or treatment, and that the Practitioner or I may terminate the telemedicine visit at any time. I understand that I have the right to inspect all information obtained and/or recorded in the course of the telemedicine visit and may receive copies of available information for a reasonable fee.  I understand that some of the potential risks of receiving the Services via telemedicine include:  Marland Kitchen Delay or interruption in medical evaluation due to technological equipment failure or disruption; . Information transmitted may not be sufficient (e.g. poor resolution of images) to allow for appropriate medical decision making by the Practitioner;  and/or  . In rare instances, security protocols could fail, causing a breach of personal health information.  Furthermore, I acknowledge that it is my responsibility to provide information about my medical history, conditions and care that is complete and accurate to the best of my ability. I acknowledge that Practitioner's advice, recommendations, and/or decision may be based on factors not within their control, such as incomplete or inaccurate data provided by me or distortions of diagnostic images or specimens that may result from electronic transmissions. I understand that the practice of medicine is not an exact science and that Practitioner makes no warranties or guarantees regarding treatment outcomes. I acknowledge that a copy of this consent can be made available to me via my patient portal ([place]), or I can request a printed copy by calling the office of [place].    I understand that my insurance will be billed for this visit.   I have read or had this consent read to me. . I understand the contents of this consent, which adequately explains the benefits and risks of the Services being provided via telemedicine.  . I have been provided ample opportunity to ask questions regarding this consent and the Services and have had my questions answered to my satisfaction. . I give my informed consent for the services to be provided through the use of telemedicine in my medical care

## 2020-05-10 NOTE — Patient Instructions (Signed)
Medication Instructions:  Please continue all current cardiac medications, there are no changes at this time,  *If you need a refill on your cardiac medications before your next appointment, please call your pharmacy*   Lab Work: None  Testing/Procedures: None   Follow-Up:  Your next appointment:   1 year(s)  The format for your next appointment:   In Person  Provider:   You may see Dr. Rockey Situ or one of the following Advanced Practice Providers on your designated Care Team:    Marrianne Mood, [place]

## 2020-05-16 DIAGNOSIS — L720 Epidermal cyst: Secondary | ICD-10-CM | POA: Diagnosis not present

## 2020-05-19 ENCOUNTER — Telehealth: Payer: Self-pay

## 2020-05-19 NOTE — Chronic Care Management (AMB) (Addendum)
Chronic Care Management [facility]   Name: Aaron Stafford  MRN: [id number] DOB: April 29, 1934  Reason for Encounter: Initial Questions for CCM visit on 05/24/20    Recent office visits:  04/07/20- Dr. Danise Stafford, PCP  Recent consult visits:  05/10/20- Aaron Stafford, [facility] visits:  Medication Reconciliation was completed by comparing discharge summary, patient's EMR and Pharmacy list, and upon discussion with patient.  Visited ED on 12/18/19 due to leg pain, [facility].  Given oxycodone short term with relief. Pt was not admitted.  New?Medications Started at [facility] Discharge:?? -started oxycodone short term due to leg pain  Medication Changes at Hospital Discharge: - no medications changed  Medications Discontinued at Hospital Discharge: -No medications discontinued  Medications that remain the same after Hospital Discharge:??  -All other medications will remain the same.    Medications: Outpatient Encounter Medications as of 05/19/2020  Medication Sig   Alpha-Lipoic Acid 600 MG CAPS Take 1 capsule (600 mg total) by mouth 2 (two) times daily.   amLODipine (NORVASC) 5 MG tablet TAKE 1 TABLET BY MOUTH EVERY DAY   aspirin 81 MG tablet Take 81 mg by mouth daily.   Blood Glucose Monitoring Suppl (ONETOUCH VERIO FLEX SYSTEM) w/Device KIT 1 Device by Does not apply route daily. Test blood sugar once daily and as directed. Dx E11.42   Cholecalciferol (VITAMIN D3) 1000 UNITS CAPS Take 1 capsule (1,000 Units total) by mouth daily. (Patient taking differently: Take 1 capsule by mouth as needed.)   Cranberry 500 MG CAPS Take 2 capsules (1,000 mg total) by mouth daily.   diclofenac Sodium (VOLTAREN) 1 % GEL Apply 2 g topically 3 (three) times daily.   finasteride (PROSCAR) 5 MG tablet TAKE 1 TABLET BY MOUTH EVERY DAY   fluticasone (FLONASE) 50 MCG/ACT nasal spray Place 2 sprays into both nostrils daily. (Patient taking differently: Place 2 sprays into both  nostrils daily. As needed)   furosemide (LASIX) 20 MG tablet TAKE 1 TABLET BY MOUTH EVERY DAY   gabapentin (NEURONTIN) 300 MG capsule Take 2-3 capsules (600-900 mg total) by mouth in the morning and at bedtime.   hydrochlorothiazide (HYDRODIURIL) 25 MG tablet TAKE 1 TABLET BY MOUTH EVERY DAY   ipratropium (ATROVENT) 0.06 % nasal spray Place 2 sprays into both nostrils 4 (four) times daily as needed for rhinitis.   losartan (COZAAR) 25 MG tablet TAKE 1 TABLET BY MOUTH EVERY DAY   metFORMIN (GLUCOPHAGE) 500 MG tablet TAKE 1 TABLET (500 MG TOTAL) BY MOUTH 2 (TWO) TIMES DAILY WITH A MEAL.   Multiple Vitamin (MULTIVITAMIN) tablet Take 1 tablet by mouth daily.   OneTouch Delica Lancets 99I MISC CHECK BLOOD SUGAR ONCE DAILY AND AS INSTRUCTED. DX E11.42   ONETOUCH VERIO test strip USE TO TEST BLOOD SUGAR ONCE DAILY   oxyCODONE-acetaminophen (PERCOCET) 5-325 MG tablet Take 2 tablets by mouth every 6 (six) hours as needed for severe pain.   pravastatin (PRAVACHOL) 40 MG tablet TAKE 1 TABLET BY MOUTH EVERYDAY AT BEDTIME   traZODone (DESYREL) 50 MG tablet TAKE 1 TO 1 AND 1/2 TABLETS BY MOUTH AT BEDTIME AS NEEDED FOR SLEEP   valACYclovir (VALTREX) 1000 MG tablet TAKE 2 TABLETS (2,000 MG TOTAL) BY MOUTH 2 (TWO) TIMES DAILY. FOR 1 DAY   vitamin B-12 (CYANOCOBALAMIN) 250 MCG tablet Take 250 mcg by mouth once a week.    No facility-administered encounter medications on file as of 05/19/2020.    Lab Results  Component Value Date/Time  HGBA1C 6.3 (A) 02/23/2020 09:17 AM   HGBA1C 6.5 08/13/2019 08:14 AM   HGBA1C 6.5 (A) 02/03/2019 09:10 AM   HGBA1C 6.9 (H) 08/07/2018 10:13 AM   MICROALBUR 3.4 (H) 02/21/2014 08:43 AM   MICROALBUR 2.3 (H) 10/12/2010 08:50 AM     BP Readings from Last 3 Encounters:  05/10/20 (!) 147/66  02/23/20 (!) 158/68  12/18/19 (!) 156/71      Have you seen any other providers since your last visit with PCP? Yes- [facility]  Any changes in your medications  or health? Yes- increased gabapentin due to leg pain  Any side effects from any medications? No  Do you have an symptoms or problems not managed by your medications? No  Any concerns about your health right now? Yes- pain in legs not any better.  Has your provider asked that you check blood pressure, blood sugar, or follow special diet at home? Yes- blood pressure regularly. Does not check BG often because meter is faulty.  Do you get any type of exercise on a regular basis? Yes- very active  Can you think of a goal you would like to reach for your health? No  Do you have any problems getting your medications? No Patient's preferred pharmacy is:  [id number] [address]Phone: [phone]: [phone]  Is there anything that you would like to discuss during the appointment? No   NMICHARL HELMESwas reminded to have all medications, supplements and any blood glucose and blood pressure readings available for review with MDebbora Stafford Pharm. D, at his telephone visit on 05/24/20 at 8:30 .    Star Rating Drugs:  Medication:  Last Fill: Day Supply Losartan 25 mg 06/26/18  90 Metformin 500 mg 02/28/20 90 Pravastatin 40 mg 05/11/20 90   Follow-Up:  Pharmacist Review  MDebbora Stafford CPP notified  LMargaretmary Dys CWest Bay ShoreAssistant [phone] I have reviewed the care management and care coordination activities outlined in this encounter and I am certifying that I agree with the content of this note. No further action required.  MDebbora Stafford PharmD Clinical Pharmacist LRussellPrimary Care at [phone]

## 2020-05-23 NOTE — Progress Notes (Signed)
Chronic Care Management Pharmacy Note  05/24/2020 Name:  Aaron Stafford MRN:  [id number] DOB:  Oct 04, 1934  Subjective: Aaron Stafford is an 86 y.o. year old male who is a primary patient of Ria Bush, MD.  The CCM team was consulted for assistance with disease management and care coordination needs.    Engaged with patient by telephone for initial visit in response to provider referral for pharmacy case management and/or care coordination services.   Consent to Services:  The patient was given the following information about Chronic Care Management services today, agreed to services, and gave verbal consent: 1. CCM service includes personalized support from designated clinical staff supervised by the primary care provider, including individualized plan of care and coordination with other care providers 2. 24/7 contact phone numbers for assistance for urgent and routine care needs. 3. Service will only be billed when office clinical staff spend 20 minutes or more in a month to coordinate care. 4. Only one practitioner may furnish and bill the service in a calendar month. 5.The patient may stop CCM services at any time (effective at the end of the month) by phone call to the office staff. 6. The patient will be responsible for cost sharing (co-pay) of up to 20% of the service fee (after annual deductible is met). Patient agreed to services and consent obtained.  Patient Care Team: Ria Bush, MD as PCP - General (Family Medicine) Kem Parkinson, MD as Consulting Physician (Ophthalmology) Rockey Situ Kathlene November, MD as Consulting Physician (Cardiology) [name], [facility] as Pharmacist (Pharmacist)  CCM Consent 04/07/20  Recent office visits: 02/23/20- Dr. Danise Mina, PCP - DM stable on metformin 500 mg BID, marked neuropathy, continue gabapentin 600 mg BID and alpha lipoic acid, BP elevated today, monitor at home   Recent consult visits:  05/10/20- Marrianne Mood, PA-C- Cardiology  - CAD s/p bypass, no symptoms of angina, continue current medications, BP elevated today overall well controlled. Call if SBP > 130s-140s on consistent basis. RTC 1 year  04/07/20 - CCM call, Neuropathy - Increase gabapentin to 300 mg - take 2-3 capsules twice daily   Hospital visits: ED on 12/18/19 due to leg pain, [facility].  Given oxycodone short term with relief. Pt was not admitted.  Objective:  Lab Results  Component Value Date   CREATININE 1.10 10/08/2019   BUN 25 (H) 10/08/2019   GFR 73.83 08/13/2019   GFRNONAA >60 10/08/2019   GFRAA >60 10/08/2019   NA 135 10/08/2019   K 4.5 10/08/2019   CALCIUM 9.5 10/08/2019   CO2 27 10/08/2019   GLUCOSE 149 (H) 10/08/2019    Lab Results  Component Value Date/Time   HGBA1C 6.3 (A) 02/23/2020 09:17 AM   HGBA1C 6.5 08/13/2019 08:14 AM   HGBA1C 6.5 (A) 02/03/2019 09:10 AM   HGBA1C 6.9 (H) 08/07/2018 10:13 AM   GFR 73.83 08/13/2019 08:14 AM   GFR 74.90 08/07/2018 10:13 AM   MICROALBUR 3.4 (H) 02/21/2014 08:43 AM   MICROALBUR 2.3 (H) 10/12/2010 08:50 AM    Last diabetic Eye exam:  Lab Results  Component Value Date/Time   HMDIABEYEEXA No Retinopathy 12/15/2019 12:00 AM    Last diabetic Foot exam:  Completed by PCP 02/23/20  Lab Results  Component Value Date   CHOL 149 08/13/2019   HDL 36.80 (L) 08/13/2019   LDLCALC 49 08/05/2013   LDLDIRECT 75.0 08/13/2019   TRIG 242.0 (H) 08/13/2019   CHOLHDL 4 08/13/2019    Hepatic Function Latest Ref Rng & Units  10/08/2019 08/13/2019 08/07/2018  Total Protein 6.5 - 8.1 g/dL 7.7 6.4 6.5  Albumin 3.5 - 5.0 g/dL 4.2 4.2 4.3  AST 15 - 41 U/L 32 16 14  ALT 0 - 44 U/L _0 Alk Phosphatase 38 - 126 U/L 50 50 57  Total Bilirubin 0.3 - 1.2 mg/dL 1.0 0.7 0.7  Bilirubin, Direct 0.0 - 0.3 mg/dL - - -    Lab Results  Component Value Date/Time   TSH 1.61 10/05/2015 03:37 PM   TSH 2.143 04/06/2010 09:33 PM    CBC Latest Ref Rng & Units 10/08/2019 10/05/2015 07/16/2011  WBC 4.0 - 10.5 K/uL  6.6 8.0 7.3  Hemoglobin 13.0 - 17.0 g/dL 14.9 15.2 15.7  Hematocrit 39.0 - 52.0 % 43.4 44.3 46.3  Platelets 150 - 400 K/uL 173 216.0 211.0    Lab Results  Component Value Date/Time   VD25OH 31.12 08/13/2019 08:14 AM   VD25OH 39.00 08/07/2018 10:13 AM    Clinical ASCVD: Yes - s/p CABG The ASCVD Risk score Mikey Bussing DC Jr., et al., 2013) failed to calculate for the following reasons:   The 2013 ASCVD risk score is only valid for ages 89 to 59    Depression screen PHQ 2/9 02/23/2020 08/13/2019 08/07/2018  Decreased Interest 0 0 0  Down, Depressed, Hopeless 0 0 0  PHQ - 2 Score 0 0 0  Altered sleeping - 0 0  Tired, decreased energy - 0 0  Change in appetite - 0 0  Feeling bad or failure about yourself  - 0 0  Trouble concentrating - 0 0  Moving slowly or fidgety/restless - 0 0  Suicidal thoughts - 0 0  PHQ-9 Score - 0 0  Difficult doing work/chores - Not difficult at all Not difficult at all    Social History   Tobacco Use  Smoking Status Never Smoker  Smokeless Tobacco Never Used   BP Readings from Last 3 Encounters:  05/10/20 (!) 147/66  02/23/20 (!) 158/68  12/18/19 (!) 156/71   Pulse Readings from Last 3 Encounters:  05/10/20 (!) 56  02/23/20 61  12/18/19 (!) 54   Wt Readings from Last 3 Encounters:  05/10/20 171 lb (77.6 kg)  02/23/20 174 lb 3.2 oz (79 kg)  12/18/19 172 lb (78 kg)   BMI Readings from Last 3 Encounters:  05/10/20 27.60 kg/m  02/23/20 28.12 kg/m  12/18/19 25.40 kg/m    Assessment/Interventions: Review of patient past medical history, allergies, medications, health status, including review of consultants reports, laboratory and other test data, was performed as part of comprehensive evaluation and provision of chronic care management services.   SDOH:  (Social Determinants of Health) assessments and interventions performed: Yes SDOH Interventions   Flowsheet Row Most Recent Value  SDOH Interventions   Financial Strain Interventions Intervention  Not Indicated  [Meds affordable]     SDOH Screenings   Alcohol Screen: Low Risk   . Last Alcohol Screening Score (AUDIT): 0  Depression (PHQ2-9): Low Risk   . PHQ-2 Score: 0  Financial Resource Strain: Low Risk   . Difficulty of Paying Living Expenses: Not very hard  Food Insecurity: No Food Insecurity  . Worried About Charity fundraiser in the Last Year: Never true  . Ran Out of Food in the Last Year: Never true  Housing: Low Risk   . Last Housing Risk Score: 0  Physical Activity: Inactive  . Days of Exercise per Week: 0 days  . Minutes of Exercise per  Session: 0 min  Social Connections: Not on file  Stress: No Stress Concern Present  . Feeling of Stress : Not at all  Tobacco Use: Low Risk   . Smoking Tobacco Use: Never Smoker  . Smokeless Tobacco Use: Never Used  Transportation Needs: No Transportation Needs  . Lack of Transportation (Medical): No  . Lack of Transportation (Non-Medical): No    CCM Care Plan  Allergies  Allergen Reactions  . Morphine Sulfate     REACTION: Hallucinations  . Nitroglycerin     REACTION: Decreased BP  . Contrast Media [Iodinated Diagnostic Agents] Rash    Severe hives    Medications Reviewed Today    Reviewed by [name], [facility] (Pharmacist) on 05/24/20 at [phone]  Med List Status: <None>  Medication Order Taking? Sig Documenting Provider Last Dose Status Informant  Alpha-Lipoic Acid 600 MG CAPS [id number] Yes Take 1 capsule (600 mg total) by mouth 2 (two) times daily.  Patient taking differently: Take 600 mg by mouth daily.   Ria Bush, MD Taking Active Self  amLODipine (NORVASC) 5 MG tablet [id number] Yes TAKE 1 TABLET BY MOUTH EVERY DAY Ria Bush, MD Taking Active   aspirin 81 MG tablet [id number] Yes Take 81 mg by mouth daily. [provider] Taking Active   Blood Glucose Monitoring Suppl ([place]) w/Device KIT [id number] No 1 Device by Does not apply route daily. Test blood sugar once daily  and as directed. Dx E11.42  Patient not taking: Reported on 05/24/2020   Ria Bush, MD Not Taking Active   Cholecalciferol (VITAMIN D3) 1000 UNITS CAPS [id number] Yes Take 1 capsule (1,000 Units total) by mouth daily. Ria Bush, MD Taking Active Self  Cranberry 500 MG CAPS [id number] Yes Take 2 capsules (1,000 mg total) by mouth daily. Ria Bush, MD Taking Active   diclofenac Sodium (VOLTAREN) 1 % GEL [id number] Yes Apply 2 g topically 3 (three) times daily. Ria Bush, MD Taking Active   finasteride (PROSCAR) 5 MG tablet [id number] Yes TAKE 1 TABLET BY MOUTH EVERY DAY Ria Bush, MD Taking Active   fluticasone [facility]) 50 MCG/ACT nasal spray [id number] Yes Place 2 sprays into both nostrils daily. Jan Fireman, PA-C Taking Active   furosemide (LASIX) 20 MG tablet [id number] Yes TAKE 1 TABLET BY MOUTH EVERY DAY Ria Bush, MD Taking Active   gabapentin (NEURONTIN) 300 MG capsule [id number] Yes Take 2-3 capsules (600-900 mg total) by mouth in the morning and at bedtime. Ria Bush, MD Taking Active   hydrochlorothiazide (HYDRODIURIL) 25 MG tablet [id number] Yes TAKE 1 TABLET BY MOUTH EVERY DAY Ria Bush, MD Taking Active        Patient not taking:      Discontinued 05/24/20 0953 (Discontinued by provider)   metFORMIN (GLUCOPHAGE) 500 MG tablet [id number] Yes TAKE 1 TABLET (500 MG TOTAL) BY MOUTH 2 (TWO) TIMES DAILY WITH A MEAL. Ria Bush, MD Taking Active   OneTouch Delica Lancets 07[address] [place] [id number] Yes CHECK BLOOD SUGAR ONCE DAILY AND AS INSTRUCTED. DX E07.12 Ria Bush, MD Taking Active   [facility] VERIO test strip [id number] No USE TO TEST BLOOD SUGAR ONCE DAILY  Patient not taking: Reported on 05/24/2020   Ria Bush, MD Not Taking Active   pravastatin (PRAVACHOL) 40 MG tablet [id number] Yes TAKE 1 TABLET BY MOUTH EVERYDAY AT BEDTIME Ria Bush, MD Taking Active   traZODone (DESYREL) 50 MG tablet [id number] Yes TAKE 1 TO  1 AND 1/2 TABLETS BY MOUTH AT BEDTIME AS NEEDED  FOR SLEEP Ria Bush, MD Taking Active   valACYclovir (VALTREX) 1000 MG tablet [id number] Yes TAKE 2 TABLETS (2,000 MG TOTAL) BY MOUTH 2 (TWO) TIMES DAILY. FOR 1 DAY Ria Bush, MD Taking Active   vitamin B-12 (CYANOCOBALAMIN) 250 MCG tablet [id number] Yes Take 250 mcg by mouth once a week.  [provider] Taking Active           Patient Active Problem List   Diagnosis Date Noted  . Right hand pain 08/18/2019  . Tremor 08/14/2018  . Urinary urgency 03/11/2017  . Peripheral neuropathy 10/05/2015  . Advanced care planning/counseling discussion 06/26/2014  . Stress 08/09/2013  . BPH with obstruction/lower urinary tract symptoms   . Medicare annual wellness visit, subsequent 03/25/2011  . S/P CABG (coronary artery bypass graft) 06/07/2010  . HTN (hypertension)   . Hyperlipidemia associated with type 2 diabetes mellitus ([place])   . Type 2 diabetes, controlled, with peripheral neuropathy ([place])   . GERD (gastroesophageal reflux disease)   . Chronic rhinitis   . Vitamin D deficiency 04/11/2008  . Insomnia 03/14/2008  . Overweight with body mass index (BMI) 25.0-29.9 07/03/2006  . ERECTILE DYSFUNCTION 07/01/2006  . Coronary atherosclerosis 07/01/2006  . IRRITABLE BOWEL SYNDROME 07/01/2006    Immunization History  Administered Date(s) Administered  . Influenza Split 11/21/2011  . Influenza Whole 11/19/2003, 03/01/2009  . Influenza, High Dose Seasonal PF 10/03/2016, 10/27/2017, 12/03/2018  . Influenza,inj,Quad PF,6+ Mos 11/23/2012, 02/15/2014, 12/14/2014, 12/21/2015  . PFIZER(Purple Top)SARS-COV-2 Vaccination 02/23/2019, 03/16/2019  . Pneumococcal Conjugate-13 08/09/2013  . Pneumococcal Polysaccharide-23 05/10/2004  . Td 12/26/1997, 04/12/2009  . Zoster 11/15/2010    Conditions to be addressed/monitored:  Hypertension, Hyperlipidemia and Diabetes  Care Plan : [place]  Updates made by [name], [facility] since 05/24/2020 12:00 AM    Problem: [place]   Priority: High  Note:   Current Barriers:  . Unable to maintain control of diabetic neuropathy . Blood pressure query controlled - pt accidentally off losartan  Pharmacist Clinical Goal(s):  Marland Kitchen Patient will adhere to plan to optimize therapeutic regimen for neuropathy as evidenced by report of adherence to recommended medication management changes through collaboration with PharmD and provider.   Interventions: . 1:1 collaboration with Ria Bush, MD regarding development and update of comprehensive plan of care as evidenced by provider attestation and co-signature . Inter-disciplinary care team collaboration (see longitudinal plan of care) . Comprehensive medication review performed; medication list updated in electronic medical record  Hypertension (BP goal <140/90) -Query Controlled - clinic readings elevated, home readings controlled -Current treatment: . Losartan 25 mg - 1 tablet daily (not taking) . Amlodipine 5 mg - 1 tablet daily  . HCTZ 25 mg - 1 tablet daily -Medications previously tried: none -Current home readings: checked last night, BP was 130s/60, 66; reports checking once weekly at home. Usually around same as today, occasional fluctuations. -We discussed refill history losartan, last filled 2020. He looked through medications and realized he did not have any losartan. Believes it fell off by accident, does not recall it being discontinued. Unable to find UACR in chart.  As home BP is within goal, okay to remain off at this time. Review BP log in 3 weeks with CMA.  -Denies hypotensive/hypertensive symptoms -Educated on BP goals and benefits of medications for prevention of heart attack, stroke and kidney damage; Symptoms of hypotension/hypertension and importance of maintaining adequate hydration; -Counseled to continue monitoring BP at home weekly, document, and provide log at  future  appointments -Recommended to continue current medication; Call if home BP consistently above 140/90. Will let Dr. Darnell Level know patient is off losartan. CMA call for BP log in 3 weeks.   Hyperlipidemia: (LDL goal < 70) -Not ideally controlled - LDL 75   -Current treatment: . Pravastatin 40 mg - 1 tablet daily (evening) -Medications previously tried: none  -Educated on Cholesterol goals;  Benefits of statin for ASCVD risk reduction; Would avoid change to high intensity statin given age, increased risk of side effects, and LDL near goal  -Recommended to continue current medication  Diabetes (A1c goal <7%) -Controlled - A1c 6.3% -Current medications: Marland Kitchen Metformin 500 mg - 1 tablet twice daily meals   -Medications previously tried: none  -Current home glucose readings - not checking routinely, he would like a new meter. Reports his old meter no longer works.  . fasting glucose: n/a . post prandial glucose: n/a -Denies hypoglycemic/hyperglycemic symptoms -Current meal patterns: tries to maintain weight around 169-173 lbs with portion control . Wife cooks healthy meals with greens, fruits  -Current exercise: None formal but active every day, limits sedentary time, pastor of church  -Educated on A1c and blood sugar goals;  -Counseled to check feet daily and get yearly eye exams -Recommended to continue current medication  Neuropathy (Goal: Control symptoms) -Uncontrolled - per patient report -Current treatment  . Alpha lipoic acid 600 mg - 1 daily . Gabapentin 300 mg - 2-3 capsules BID  -Medications previously tried: Lyrica - short term, pt was concerned about side effects. -Adherence: Takes gabapentin, 2 capsules at 6 PM and 2 capsules at 8 PM every night.  Will take 1 additional gabapentin and high dose aspirin on bad nights. Uses topical creams PRN. -Symptoms: He reports 3 episodes in the past month of unbearable pain. Pain is primarily in his toes. Shooting pain like a bee sting. Toes feels  leathery. Always both feet. He is up until 4 AM with pain on bad nights. -Tolerance: Reports next day grogginess with gabapentin.  -Recommended consult with PCP to consider addition of duloxetine  Patient Goals/Self-Care Activities . Patient will:  - take medications as prescribed check blood pressure weekly, document, and provide at future appointments  Follow Up Plan: Telephone follow up appointment with care management team member scheduled for: 6 weeks with CCM pharmacist to assess neuropathy treatment (07/10/20 11 AM phone visit)  Medication Assistance: None required.  Patient affirms current coverage meets needs.     Med Adherence: Star Rating Drugs: Medication:                Last Fill:         Day Supply Losartan 25 mg           06/26/18              90  **PAST DUE** Metformin 500 mg       02/28/20            90 Pravastatin 40 mg       05/11/20            90  [facility]  [id number] [address] [place]Phone: [phone]: [phone] Uses pill box? Yes - 4 time slots/day, fills weekly Pt endorses 100% compliance  Takes all meds in the morning except:  1 Pravastatin  (9 PM) 1 Metformin (9 PM) 1 Trazodone (10 PM) 2  Gabapentin (6 and 8 PM) 1 Aspirin 81 mg (9 PM)  OTCs: Vitamin B12 Vitamin D3 Neuro Nirvana (for Memory) Nature Fuel Beet Powder (for overall health) Cinnamon (for diabetes) Cranberry capsules - once daily (Urinary tract)   PRN: Valtrex x 1 day for cold sore   [facility]. Happy with current pharmacy. We discussed: Benefits of medication synchronization, packaging and delivery as well as enhanced pharmacist oversight with [facility]. Patient decided to: Continue current medication management strategy  Care Plan and Follow Up Patient Decision:  Patient agrees to Care Plan and Follow-up.  Follow up plan: -CMA call for BP  log and duloxetine tolerance in 3 weeks -CPP visit in 6 weeks  [name], PharmD Clinical Pharmacist [facility] at [facility] [phone]

## 2020-05-24 ENCOUNTER — Ambulatory Visit (INDEPENDENT_AMBULATORY_CARE_PROVIDER_SITE_OTHER): Payer: Medicare Other

## 2020-05-24 ENCOUNTER — Other Ambulatory Visit: Payer: Self-pay

## 2020-05-24 ENCOUNTER — Telehealth: Payer: Self-pay

## 2020-05-24 DIAGNOSIS — E1142 Type 2 diabetes mellitus with diabetic polyneuropathy: Secondary | ICD-10-CM | POA: Diagnosis not present

## 2020-05-24 DIAGNOSIS — I10 Essential (primary) hypertension: Secondary | ICD-10-CM

## 2020-05-24 DIAGNOSIS — E785 Hyperlipidemia, unspecified: Secondary | ICD-10-CM

## 2020-05-24 DIAGNOSIS — G6289 Other specified polyneuropathies: Secondary | ICD-10-CM

## 2020-05-24 DIAGNOSIS — E1169 Type 2 diabetes mellitus with other specified complication: Secondary | ICD-10-CM | POA: Diagnosis not present

## 2020-05-24 MED ORDER — ONETOUCH DELICA LANCETS 30G MISC: 3 refills | Status: DC

## 2020-05-24 MED ORDER — ONETOUCH VERIO FLEX SYSTEM W/DEVICE KIT: PACK | 0 refills | Status: DC

## 2020-05-24 MED ORDER — FLUTICASONE PROPIONATE 50 MCG/ACT NA SUSP
2.0000 | Freq: Every day | NASAL | 3 refills | Status: DC
Start: 2020-05-24 — End: 2020-07-10

## 2020-05-24 MED ORDER — DULOXETINE HCL 30 MG PO CPEP: 30.0000 mg | ORAL_CAPSULE | Freq: Every day | ORAL | 3 refills | Status: DC

## 2020-05-24 MED ORDER — ONETOUCH VERIO VI STRP: ORAL_STRIP | 3 refills | Status: DC

## 2020-05-24 NOTE — Telephone Encounter (Signed)
Ria Comment, please let pt know Cymbalta has been sent to his pharmacy. He will take this in addition to gabapentin.  [name], PharmD Clinical Pharmacist [facility] at [facility] [phone]

## 2020-05-24 NOTE — Telephone Encounter (Signed)
-----   Message from [place], [facility] sent at 05/24/2020  9:07 AM EDT ----- Regarding: Refills Pt requesting refill on Flonase and new prescription for glucometer, test strips, and lancet to [facility].  Thanks!  [name], PharmD Clinical Pharmacist [facility] at [facility] [phone]

## 2020-05-24 NOTE — Telephone Encounter (Signed)
FYI to PCP -   Pt has no fill history for losartan in past 12 months. He does not have it at home and thinks it fell off pharmacy profile by accident. His home BP is running 130s/60s, HR 60s without it. He checks BP at home weekly. However, last 3 clinic readings elevated. We will follow BP log at home and if consistently above 140/90, consider restarting. Removed from med list for now. He continues on HCTZ and amlodipine.  Vitals with BMI 05/10/2020 02/23/2020 02/23/2020  Height 5\' 6"  - 5\' 6"   Weight 171 lbs - 174 lbs 3 oz  BMI 27.61 - 47.09  Systolic 295 747 340  Diastolic 66 68 70  Pulse 56 - [place], PharmD Clinical Pharmacist [facility] at [facility] [phone]

## 2020-05-24 NOTE — Telephone Encounter (Signed)
E-scribed refills.  

## 2020-05-24 NOTE — Telephone Encounter (Signed)
Reasonable to try cymbalta.  I have sent in 30mg  dose to pharmacy.

## 2020-05-24 NOTE — Telephone Encounter (Signed)
Noted! Thank you

## 2020-05-24 NOTE — Chronic Care Management (AMB) (Addendum)
Contacted patient to inform him that Cymbalta has been sent to his pharmacy. He was made aware to take this in addition to his gabapentin. Patient verbalized understanding.    Follow-Up:  Pharmacist Review  [name], CPP notified  [name], [place] [facility] Assistant [phone]

## 2020-05-24 NOTE — Progress Notes (Signed)
Encounter details: CCM Time Spent       Value Time User   Time spent with patient (minutes)  115 05/24/2020 10:22 AM [name], RPH   Time spent performing Chart review  30 05/24/2020 10:22 AM [name], [facility]   Total time (minutes)  145 05/24/2020 10:22 AM [name], RPH      Moderate to High Complex Decision Making       Value Time User   Moderate to High complex decision making  Yes 05/24/2020  9:56 AM [name], [facility]      CCM Services: This encounter meets complex CCM services and moderate to high decision making.  Prior to outreach and patient consent for Chronic Care Management, I referred this patient for services after reviewing the nominated patient list or from a personal encounter with the patient.  I have personally reviewed this encounter including the documentation in this note and have collaborated with the care management provider regarding care management and care coordination activities to include development and update of the comprehensive care plan. I am certifying that I agree with the content of this note and encounter as supervising physician.

## 2020-05-24 NOTE — Telephone Encounter (Signed)
Pt reports neuropathy remains uncontrolled on gabapentin despite dose increase last month. Dose limited to 1200 mg/day on routine basis with an additional 600 mg PRN as he is having some morning grogginess. We discussed trial of duloxetine as add on therapy. He is open to new treatment. Pain is keeping him up at night. Renal function appropriate.   Current treatment: Gabapentin 300 mg - 2 capsules twice daily at supper and bedtime  Alpha lipoic acid 600 mg - 1 daily  Plan: - Start duloxetine 30 mg daily, increase to 60 mg after 4 weeks if tolerated (pending approved by PCP) PCP to send in rx to [place] follow up call 3 weeks to review tolerance.   Lab Results  Component Value Date   CREATININE 1.10 10/08/2019   BUN 25 (H) 10/08/2019   GFR 73.83 08/13/2019   GFRNONAA >60 10/08/2019   GFRAA >60 10/08/2019   NA 135 10/08/2019   K 4.5 10/08/2019   CALCIUM 9.5 10/08/2019   CO2 27 10/08/2019   [name], PharmD Clinical Pharmacist [place] [facility] at [facility] [phone]

## 2020-05-24 NOTE — Patient Instructions (Signed)
May 24, 2020  Dear Aaron Stafford,  It was a pleasure meeting you during our initial appointment on May 24, 2020. Below is a summary of the goals we discussed and components of chronic care management. Please contact me anytime with questions or concerns.   Visit Information  Patient Care Plan: CCM Pharmacy Care Plan    Problem Identified: CCM Pharmacy Care Plan   Priority: High  Note:   Current Barriers:  . Unable to maintain control of diabetic neuropathy  Pharmacist Clinical Goal(s):  Marland Kitchen Patient will adhere to plan to optimize therapeutic regimen for neuropathy as evidenced by report of adherence to recommended medication management changes through collaboration with PharmD and provider.   Interventions: . 1:1 collaboration with Ria Bush, MD regarding development and update of comprehensive plan of care as evidenced by provider attestation and co-signature . Inter-disciplinary care team collaboration (see longitudinal plan of care) . Comprehensive medication review performed; medication list updated in electronic medical record  Hypertension (BP goal <140/90) -Controlled - per clinic and home readings -Current treatment: . Losartan 25 mg - 1 tablet daily (not taking) . Amlodipine 5 mg - 1 tablet daily  . HCTZ 25 mg - 1 tablet daily -Medications previously tried: none -Current home readings: checked last night, BP was 130s/60, 66; reports checking once weekly at home. Usually around same as today, occasional fluctuations. -We discussed refill history losartan, last filled 2020. He looked through medications and realized he did not have any losartan. Believes it fell off by accident, does not recall it being discontinued. As BP is within goal, okay to remain off at this time. Unable to find UACR in chart.  -Denies hypotensive/hypertensive symptoms -Educated on BP goals and benefits of medications for prevention of heart attack, stroke and kidney damage; Symptoms of  hypotension and importance of maintaining adequate hydration; -Counseled to continue monitoring BP at home weekly, document, and provide log at future appointments -Recommended to continue current medication; Call if home BP consistently above 140/90. Will let Dr. Darnell Level know patient is off losartan.  Hyperlipidemia: (LDL goal < 70) -Not ideally controlled - LDL 75   -Current treatment: . Pravastatin 40 mg - 1 tablet daily (evening) -Medications previously tried: none  -Educated on Cholesterol goals;  Benefits of statin for ASCVD risk reduction; Would avoid change to high intensity statin given age, increased risk of side effects, and LDL near goal  -Recommended to continue current medication  Diabetes (A1c goal <7%) -Controlled - A1c 6.3% -Current medications: Marland Kitchen Metformin 500 mg - 1 tablet twice daily meals   -Medications previously tried: none  -Current home glucose readings - not checking routinely, he would like a new meter. Reports his old meter no longer works.  . fasting glucose: n/a . post prandial glucose: n/a -Denies hypoglycemic/hyperglycemic symptoms -Current meal patterns: tries to maintain weight around 169-173 lbs with portion control . Wife cooks healthy meals with greens, fruits  -Current exercise: None formal but active every day, limits sedentary time, pastor of church  -Educated on A1c and blood sugar goals;  -Counseled to check feet daily and get yearly eye exams -Recommended to continue current medication  Neuropathy (Goal: Control symptoms) -Uncontrolled - per patient report -Current treatment  . Alpha lipoic acid 600 mg - 1 daily . Gabapentin 300 mg - 2-3 capsules BID  -Medications previously tried: Lyrica - short term, pt was concerned about side effects. -Adherence: Takes gabapentin, 2 capsules at 6 PM and 2 capsules at 8 PM every  night.  Will take 1 additional gabapentin and high dose aspirin on bad nights. Uses topical creams PRN. -Symptoms: He reports 3  episodes in the past month of unbearable pain. Pain is primarily in his toes. Shooting pain like a bee sting. Toes feels leathery. Always both feet. He is up until 4 AM with pain on bad nights. -Tolerance: Reports next day grogginess with gabapentin.  -Recommended consult with PCP to consider addition of duloxetine  Patient Goals/Self-Care Activities . Patient will:  - take medications as prescribed check blood pressure weekly, document, and provide at future appointments  Follow Up Plan: Telephone follow up appointment with care management team member scheduled for: 6 weeks with CCM pharmacist to assess neuropathy treatment (07/10/20 11 AM phone visit)  Medication Assistance: None required.  Patient affirms current coverage meets needs.     Aaron Stafford was given information about Chronic Care Management services today including:  1. CCM service includes personalized support from designated clinical staff supervised by his physician, including individualized plan of care and coordination with other care providers 2. 24/7 contact phone numbers for assistance for urgent and routine care needs. 3. Standard insurance, coinsurance, copays and deductibles apply for chronic care management only during months in which we provide at least 20 minutes of these services. Most insurances cover these services at 100%, however patients may be responsible for any copay, coinsurance and/or deductible if applicable. This service may help you avoid the need for more expensive face-to-face services. 4. Only one practitioner may furnish and bill the service in a calendar month. 5. The patient may stop CCM services at any time (effective at the end of the month) by phone call to the office staff.  Patient agreed to services and verbal consent obtained.   The patient verbalized understanding of instructions, educational materials, and care plan provided today and agreed to receive a mailed copy of patient  instructions, educational materials, and care plan.   [name], PharmD Clinical Pharmacist [facility] at [facility] [phone]   Basics of Medicine Management Taking your medicines correctly is an important part of managing or preventing medical problems. Make sure you know what disease or condition your medicine is treating, and how and when to take it. If you do not take your medicine correctly, it may not work well and may cause unpleasant side effects, including serious health problems. What should I do when I am taking medicines?  Read all the labels and inserts that come with your medicines. Review the information often.  Talk with your pharmacist if you get a refill and notice a change in the size, color, or shape of your medicines.  Know the potential side effects for each medicine that you take.  Try to get all your medicines from the same pharmacy. The pharmacist will have all your information and will understand how your medicines will affect each other (interact).  Tell your health care provider about all your medicines, including over-the-counter medicines, vitamins, and herbal or dietary supplements. He or she will make sure that nothing will interact with any of your prescribed medicines.   How can I take my medicines safely?  Take medicines only as told by your health care provider. ? Do not take more of your medicine than instructed. ? Do not take anyone else's medicines. ? Do not share your medicines with others. ? Do not stop taking your medicines unless your health care provider tells you to do so. ? You may need to  avoid alcohol or certain foods or liquids when taking certain medicines. Follow your health care provider's instructions.  Do not split, mash, or chew your medicines unless your health care provider tells you to do so. Tell your health care provider if you have trouble swallowing your medicines.  For liquid medicine, use the dosing  container that was provided. How should I organize my medicines? Know your medicines  Know what each of your medicines looks like. This includes size, color, and shape. Tell your health care provider if you are having trouble recognizing all the medicines that you are taking.  If you cannot tell your medicines apart because they look similar, keep them in original bottles.  If you cannot read the labels on the bottles, tell your pharmacist to put your medicines in containers with large print.  Review your medicines and your schedule with family members, a friend, or a caregiver. Use a pill organizer  Use a tool to organize your medicine schedule. Tools include a weekly pillbox, a written chart, a notebook, or a calendar.  Your tool should help you remember the following things about each medicine: ? The name of the medicine. ? The amount (dose) to take. ? The schedule. This is the day and time the medicine should be taken. ? The appearance. This includes color, shape, size, and stamp. ? How to take your medicines. This includes instructions to take them with food, without food, with fluids, or with other medicines.  Create reminders for taking your medicines. Use sticky notes, or alarms on your watch, mobile device, or phone calendar.  You may choose to use a more advanced management system. These systems have storage, alarms, and visual and audio prompts.  Some medicines can be taken on an "as-needed" basis. These include medicines for nausea or pain. If you take an as-needed medicine, write down the name and dose, as well as the date and time that you took it.   How should I plan for travel?  Take your pillbox, medicines, and organization system with you when traveling.  Have your medicines refilled before you travel. This will ensure that you do not run out of your medicines while you are away from home.  Always carry an updated list of your medicines with you. If there is an  emergency, a first responder can quickly see what medicines you are taking.  Do not pack your medicines in checked luggage in case your luggage is lost or delayed.  If any of your medicines is considered a controlled substance, make sure you bring a letter from your health care provider with you. How should I store and discard my medicines? For safe storage:  Store medicines in a cool, dry area away from light, or as directed by your health care provider. Do not store medicines in the bathroom. Heat and humidity will affect them.  Do not store your medicines with other chemicals, or with medicines for pets or other household members.  Keep medicines away from children and pets. Do not leave them on counters or bedside tables. Store them in high cabinets or on high shelves. For safe disposal:  Check expiration dates regularly. Do not take expired medicines. Discard medicines that are older than the expiration date.  Learn a safe way to dispose of your medicines. You may: ? Use a local government, hospital, or pharmacy medicine-take-back program. ? Mix the medicines with inedible substances, put them in a sealed bag or empty container, and throw them in  the trash. What should I remember?  Tell your health care provider if you: ? Experience side effects. ? Have new symptoms. ? Have other concerns about taking your medicines.  Review your medicines regularly with your health care provider. Other medicines, diet, medical conditions, weight changes, and daily habits can all affect how medicines work. Ask if you need to continue taking each medicine, and discuss how well each one is working.  Refill your medicines early to avoid running out of them.  In case of an accidental overdose, call your local [place] at [phone] or visit your local emergency department immediately. This is important. Summary  Taking your medicines correctly is an important part of managing or  preventing medical problems.  You need to make sure that you understand what you are taking a medicine for, as well as how and when you need to take it.  Know your medicines and use a pill organizer to help you take your medicines correctly.  In case of an accidental overdose, call your local [place] at [phone] or visit your local emergency department immediately. This is important. This information is not intended to replace advice given to you by your health care provider. Make sure you discuss any questions you have with your health care provider. Document Revised: 01/30/2017 Document Reviewed: 01/30/2017 Elsevier Patient Education  2021 [employer].

## 2020-06-09 ENCOUNTER — Telehealth: Payer: Self-pay

## 2020-06-09 NOTE — Chronic Care Management (AMB) (Addendum)
Chronic Care Management Pharmacy Assistant   Name: Aaron Stafford  MRN: [id number] DOB: Sep 22, 1934  Reason for Encounter: Medication Review - Review tolerance of duloxetine and blood pressure log    Recent office visits:  05/24/20 [name], Pharm D - telephone encounter - Added Cymbalta per Dr. Danise Mina  Recent consult visits:  None since last CCM contact   Medications: Outpatient Encounter Medications as of 06/09/2020  Medication Sig   Alpha-Lipoic Acid 600 MG CAPS Take 1 capsule (600 mg total) by mouth 2 (two) times daily. (Patient taking differently: Take 600 mg by mouth daily.)   amLODipine (NORVASC) 5 MG tablet TAKE 1 TABLET BY MOUTH EVERY DAY   aspirin 81 MG tablet Take 81 mg by mouth daily.   Blood Glucose Monitoring Suppl (ONETOUCH VERIO FLEX SYSTEM) w/Device KIT Check blood sugar once daily   Cholecalciferol (VITAMIN D3) 1000 UNITS CAPS Take 1 capsule (1,000 Units total) by mouth daily.   Cranberry 500 MG CAPS Take 2 capsules (1,000 mg total) by mouth daily.   diclofenac Sodium (VOLTAREN) 1 % GEL Apply 2 g topically 3 (three) times daily.   DULoxetine (CYMBALTA) 30 MG capsule Take 1 capsule (30 mg total) by mouth daily.   finasteride (PROSCAR) 5 MG tablet TAKE 1 TABLET BY MOUTH EVERY DAY   fluticasone (FLONASE) 50 MCG/ACT nasal spray Place 2 sprays into both nostrils daily.   furosemide (LASIX) 20 MG tablet TAKE 1 TABLET BY MOUTH EVERY DAY   gabapentin (NEURONTIN) 300 MG capsule Take 2-3 capsules (600-900 mg total) by mouth in the morning and at bedtime.   glucose blood (ONETOUCH VERIO) test strip Check blood sugar once daily   hydrochlorothiazide (HYDRODIURIL) 25 MG tablet TAKE 1 TABLET BY MOUTH EVERY DAY   metFORMIN (GLUCOPHAGE) 500 MG tablet TAKE 1 TABLET (500 MG TOTAL) BY MOUTH 2 (TWO) TIMES DAILY WITH A MEAL.   OneTouch Delica Lancets 76E MISC Check blood sugar once daily   pravastatin (PRAVACHOL) 40 MG tablet TAKE 1 TABLET BY MOUTH EVERYDAY AT BEDTIME    traZODone (DESYREL) 50 MG tablet TAKE 1 TO 1 AND 1/2 TABLETS BY MOUTH AT BEDTIME AS NEEDED FOR SLEEP   valACYclovir (VALTREX) 1000 MG tablet TAKE 2 TABLETS (2,000 MG TOTAL) BY MOUTH 2 (TWO) TIMES DAILY. FOR 1 DAY   vitamin B-12 (CYANOCOBALAMIN) 250 MCG tablet Take 250 mcg by mouth once a week.    No facility-administered encounter medications on file as of 06/09/2020.   Recent Office Vitals: BP Readings from Last 3 Encounters:  05/10/20 (!) 147/66  02/23/20 (!) 158/68  12/18/19 (!) 156/71   Pulse Readings from Last 3 Encounters:  05/10/20 (!) 56  02/23/20 61  12/18/19 (!) 54    Wt Readings from Last 3 Encounters:  05/10/20 171 lb (77.6 kg)  02/23/20 174 lb 3.2 oz (79 kg)  12/18/19 172 lb (78 kg)     Kidney Function Lab Results  Component Value Date/Time   CREATININE 1.10 10/08/2019 12:35 PM   CREATININE 0.97 08/13/2019 08:14 AM   GFR 73.83 08/13/2019 08:14 AM   GFRNONAA >60 10/08/2019 12:35 PM   GFRAA >60 10/08/2019 12:35 PM    BMP Latest Ref Rng & Units 10/08/2019 08/13/2019 08/07/2018  Glucose 70 - 99 mg/dL 149(H) 147(H) 135(H)  BUN 8 - 23 mg/dL 25(H) 18 18  Creatinine 0.61 - 1.24 mg/dL 1.10 0.97 0.96  Sodium 135 - 145 mmol/L 135 139 139  Potassium 3.5 - 5.1 mmol/L 4.5 3.7 4.4  Chloride 98 - 111 mmol/L 97(L) 98 99  CO2 22 - 32 mmol/L 27 32 30  Calcium 8.9 - 10.3 mg/dL 9.5 10.1 9.9   Contacted patient to discuss how he was tolerating duloxetine and discuss BP readings. Unable to reach patient on 4/22, 4/25 and 4/26. Will follow up next month.   Current antihypertensive regimen:  Amlodipine 5 mg - 1 tablet daily  HCTZ 25 mg - 1 tablet daily  What recent interventions/DTPs have been made by any provider to improve Blood Pressure control since last CPP Visit:  No recent interventions. Patient has not taken losartan in the past year.   Any recent hospitalizations or ED visits since last visit with CPP? No  Adherence Review: Is the patient currently on ACE/ARB  medication? No Does the patient have >5 day gap between last estimated fill dates? No gaps in adherence  Star Rating Drugs:  Medication:  Last Fill: Day Supply Metformin 500 mg 05/24/20  90 Pravastatin 40 mg 05/11/20 90  Follow-Up:  Pharmacist Review  [name], CPP notified  [name], [place] Assistant [phone]  I have reviewed the care management and care coordination activities outlined in this encounter and I am certifying that I agree with the content of this note. No further action required.  [name], PharmD Clinical Pharmacist [facility] at [facility] [phone]

## 2020-06-16 ENCOUNTER — Other Ambulatory Visit: Payer: Self-pay | Admitting: Family Medicine

## 2020-06-16 NOTE — Telephone Encounter (Signed)
Pharmacy requesting 90-day rx.  Duloxetine Last rx:  05/24/20, #30 Last OV:  02/23/20, 6 mo DM f/u Next OV:  08/22/20, AWV prt 2

## 2020-06-22 ENCOUNTER — Other Ambulatory Visit: Payer: Self-pay | Admitting: Family Medicine

## 2020-07-03 ENCOUNTER — Telehealth: Payer: Self-pay

## 2020-07-03 NOTE — Chronic Care Management (AMB) (Addendum)
Chronic Care Management Pharmacy Assistant   Name: Aaron Stafford  MRN: [id number] DOB: 1934-10-30   Reason for Encounter: CCM Reminder Call    Recent office visits:  None since last CCM contact  Recent consult visits:  None since last CCM contact  Hospital visits:  None in previous 6 months  Medications: Outpatient Encounter Medications as of 07/03/2020  Medication Sig   DULoxetine (CYMBALTA) 30 MG capsule TAKE 1 CAPSULE BY MOUTH EVERY DAY   Alpha-Lipoic Acid 600 MG CAPS Take 1 capsule (600 mg total) by mouth 2 (two) times daily. (Patient taking differently: Take 600 mg by mouth daily.)   amLODipine (NORVASC) 5 MG tablet TAKE 1 TABLET BY MOUTH EVERY DAY   aspirin 81 MG tablet Take 81 mg by mouth daily.   Blood Glucose Monitoring Suppl (ONETOUCH VERIO FLEX SYSTEM) w/Device KIT Check blood sugar once daily   Cholecalciferol (VITAMIN D3) 1000 UNITS CAPS Take 1 capsule (1,000 Units total) by mouth daily.   Cranberry 500 MG CAPS Take 2 capsules (1,000 mg total) by mouth daily.   diclofenac Sodium (VOLTAREN) 1 % GEL Apply 2 g topically 3 (three) times daily.   finasteride (PROSCAR) 5 MG tablet TAKE 1 TABLET BY MOUTH EVERY DAY   fluticasone (FLONASE) 50 MCG/ACT nasal spray Place 2 sprays into both nostrils daily.   furosemide (LASIX) 20 MG tablet TAKE 1 TABLET BY MOUTH EVERY DAY   gabapentin (NEURONTIN) 300 MG capsule Take 2-3 capsules (600-900 mg total) by mouth in the morning and at bedtime.   glucose blood (ONETOUCH VERIO) test strip Check blood sugar once daily   hydrochlorothiazide (HYDRODIURIL) 25 MG tablet TAKE 1 TABLET BY MOUTH EVERY DAY   metFORMIN (GLUCOPHAGE) 500 MG tablet TAKE 1 TABLET (500 MG TOTAL) BY MOUTH 2 (TWO) TIMES DAILY WITH A MEAL.   OneTouch Delica Lancets 30G MISC Check blood sugar once daily   pravastatin (PRAVACHOL) 40 MG tablet TAKE 1 TABLET BY MOUTH EVERYDAY AT BEDTIME   traZODone (DESYREL) 50 MG tablet TAKE 1 TO 1 AND 1/2 TABLETS BY MOUTH AT BEDTIME AS  NEEDED FOR SLEEP   valACYclovir (VALTREX) 1000 MG tablet TAKE 2 TABLETS (2,000 MG TOTAL) BY MOUTH 2 (TWO) TIMES DAILY. FOR 1 DAY   vitamin B-12 (CYANOCOBALAMIN) 250 MCG tablet Take 250 mcg by mouth once a week.    No facility-administered encounter medications on file as of 07/03/2020.    Aaron Stafford was contacted to remind him of his upcoming telephone visit with Phil Dopp on 07/10/2020 at 11:00am  he was reminded to have all medications, supplements and any blood glucose and blood pressure readings available for review at appointment.    Are you having any problems with your medications?  The patient states he does not have any issues, he tolerates the capsules better than tablet form.   What concerns would you like to discuss with the pharmacist?  The patient reports he feels very well even though he continues to battle with his neuropathy.  Star Rating Drugs: Medication:  Last Fill: Day Supply Metformin 500 05/24/2020 90 Pravastatin 40mg  05/11/2020 90   CCM appointment 11:00am on 07/10/2020  07/12/2020, CPP notified  Phil Dopp, [facility] Clinical Pharmacy Assistant [phone]  I have reviewed the care management and care coordination activities outlined in this encounter and I am certifying that I agree with the content of this note. No further action required.  [phone], PharmD Clinical Pharmacist [facility] at [facility] [phone]

## 2020-07-07 ENCOUNTER — Other Ambulatory Visit: Payer: Self-pay | Admitting: Family Medicine

## 2020-07-10 ENCOUNTER — Other Ambulatory Visit: Payer: Self-pay

## 2020-07-10 ENCOUNTER — Other Ambulatory Visit: Payer: Self-pay | Admitting: Family Medicine

## 2020-07-10 ENCOUNTER — Ambulatory Visit (INDEPENDENT_AMBULATORY_CARE_PROVIDER_SITE_OTHER): Payer: Medicare Other

## 2020-07-10 DIAGNOSIS — G6289 Other specified polyneuropathies: Secondary | ICD-10-CM

## 2020-07-10 DIAGNOSIS — E1169 Type 2 diabetes mellitus with other specified complication: Secondary | ICD-10-CM

## 2020-07-10 DIAGNOSIS — E785 Hyperlipidemia, unspecified: Secondary | ICD-10-CM

## 2020-07-10 DIAGNOSIS — I10 Essential (primary) hypertension: Secondary | ICD-10-CM

## 2020-07-10 DIAGNOSIS — E1142 Type 2 diabetes mellitus with diabetic polyneuropathy: Secondary | ICD-10-CM

## 2020-07-10 NOTE — Patient Instructions (Signed)
Dear Aaron Stafford,  Below is a summary of the goals we discussed during our follow up appointment on Jul 10, 2020. Please contact me anytime with questions or concerns.   Visit Information  Patient Care Plan: CCM Pharmacy Care Plan    Problem Identified: CCM Pharmacy Care Plan   Priority: High  Note:   Current Barriers:  . None identified   Pharmacist Clinical Goal(s):  Aaron Stafford Patient will contact office with questions or concerns and continue current therapy   Interventions: . 1:1 collaboration with Ria Bush, MD regarding development and update of comprehensive plan of care as evidenced by provider attestation and co-signature . Inter-disciplinary care team collaboration (see longitudinal plan of care) . Comprehensive medication review performed; medication list updated in electronic medical record  Hypertension (BP goal <140/90) -Controlled - home blood pressure readings controlled -Current treatment: . Amlodipine 5 mg - 1 tablet daily  . HCTZ 25 mg - 1 tablet daily -Medications previously tried: losartan  -Current home readings: checking daily since 06/09/20 - BP ranging 120-143,70s  -Denies hypotensive/hypertensive symptoms -Counseled to continue monitoring BP at home once monthly and with symptoms of dizziness, document, and provide log at future appointments -Recommended to continue current medication; Okay to remain off losartan.  Hyperlipidemia: (LDL goal < 70) -LDL near goal - 75 -Current treatment: . Pravastatin 40 mg - 1 tablet daily (evening) -Medications previously tried: none  -No updates/changes 07/10/20 -Recommended to continue current medication  Diabetes (A1c goal <7%) -Controlled - A1c 6.3% -Current medications: Aaron Stafford Metformin 500 mg - 1 tablet twice daily meals   -Medications previously tried: none  -Current home glucose readings - not checking routinely, he has not received new meter. -Pt requesting new rx for blood sugar - One Touch Ultra 2 (send  to [facility]); Test Strips and Lancets. Reviewed chart and this was sent to [facility] 05/24/20. -No changes in diet or exercise, active lifestyle and balanced diet -Recommended to continue current medication; Patient will call if [facility] does not have prescription for glucometer and supplies and we can resend.  Neuropathy (Goal: Control symptoms) -Controlled - per patient report -States tolerating duloxetine well and symptoms are controlled. Denies any recent flares.  -Current treatment  . Alpha lipoic acid 600 mg - 1 daily . Gabapentin 300 mg - 2-3 capsules BID  . Duloxetine 30 mg - 1 daily at bedtime  -Medications previously tried: Lyrica - short term, pt was concerned about side effects. -Recommended continue current medication; Follow up with PCP in July. Call if any changes in pain control.  Patient Goals/Self-Care Activities . Patient will:  - take medications as prescribed -check blood pressure with abnormal symptoms, document, and provide at future appointments -check blood glucose with any abnormal symptoms, document and provide at future appointments  Follow Up Plan: Telephone follow up appointment with care management team member scheduled for: 4 months   Medication Assistance: None required.  Patient affirms current coverage meets needs.      The patient verbalized understanding of instructions, educational materials, and care plan provided today and agreed to receive a mailed copy of patient instructions, educational materials, and care plan.   [name], PharmD Clinical Pharmacist [facility] [phone]   Basics of Medicine Management Taking your medicines correctly is an important part of managing or preventing medical problems. Make sure you know what disease or condition your medicine is treating, and how and when to take it. If you do not  take your medicine correctly, it may not work well and may cause unpleasant side effects,  including serious health problems. What should I do when I am taking medicines?  Read all the labels and inserts that come with your medicines. Review the information often.  Talk with your pharmacist if you get a refill and notice a change in the size, color, or shape of your medicines.  Know the potential side effects for each medicine that you take.  Try to get all your medicines from the same pharmacy. The pharmacist will have all your information and will understand how your medicines will affect each other (interact).  Tell your health care provider about all your medicines, including over-the-counter medicines, vitamins, and herbal or dietary supplements. He or she will make sure that nothing will interact with any of your prescribed medicines.   How can I take my medicines safely?  Take medicines only as told by your health care provider. ? Do not take more of your medicine than instructed. ? Do not take anyone else's medicines. ? Do not share your medicines with others. ? Do not stop taking your medicines unless your health care provider tells you to do so. ? You may need to avoid alcohol or certain foods or liquids when taking certain medicines. Follow your health care provider's instructions.  Do not split, mash, or chew your medicines unless your health care provider tells you to do so. Tell your health care provider if you have trouble swallowing your medicines.  For liquid medicine, use the dosing container that was provided. How should I organize my medicines? Know your medicines  Know what each of your medicines looks like. This includes size, color, and shape. Tell your health care provider if you are having trouble recognizing all the medicines that you are taking.  If you cannot tell your medicines apart because they look similar, keep them in original bottles.  If you cannot read the labels on the bottles, tell your pharmacist to put your medicines in containers  with large print.  Review your medicines and your schedule with family members, a friend, or a caregiver. Use a pill organizer  Use a tool to organize your medicine schedule. Tools include a weekly pillbox, a written chart, a notebook, or a calendar.  Your tool should help you remember the following things about each medicine: ? The name of the medicine. ? The amount (dose) to take. ? The schedule. This is the day and time the medicine should be taken. ? The appearance. This includes color, shape, size, and stamp. ? How to take your medicines. This includes instructions to take them with food, without food, with fluids, or with other medicines.  Create reminders for taking your medicines. Use sticky notes, or alarms on your watch, mobile device, or phone calendar.  You may choose to use a more advanced management system. These systems have storage, alarms, and visual and audio prompts.  Some medicines can be taken on an "as-needed" basis. These include medicines for nausea or pain. If you take an as-needed medicine, write down the name and dose, as well as the date and time that you took it.   How should I plan for travel?  Take your pillbox, medicines, and organization system with you when traveling.  Have your medicines refilled before you travel. This will ensure that you do not run out of your medicines while you are away from home.  Always carry an updated list of your medicines  with you. If there is an emergency, a first responder can quickly see what medicines you are taking.  Do not pack your medicines in checked luggage in case your luggage is lost or delayed.  If any of your medicines is considered a controlled substance, make sure you bring a letter from your health care provider with you. How should I store and discard my medicines? For safe storage:  Store medicines in a cool, dry area away from light, or as directed by your health care provider. Do not store medicines  in the bathroom. Heat and humidity will affect them.  Do not store your medicines with other chemicals, or with medicines for pets or other household members.  Keep medicines away from children and pets. Do not leave them on counters or bedside tables. Store them in high cabinets or on high shelves. For safe disposal:  Check expiration dates regularly. Do not take expired medicines. Discard medicines that are older than the expiration date.  Learn a safe way to dispose of your medicines. You may: ? Use a local government, hospital, or pharmacy medicine-take-back program. ? Mix the medicines with inedible substances, put them in a sealed bag or empty container, and throw them in the trash. What should I remember?  Tell your health care provider if you: ? Experience side effects. ? Have new symptoms. ? Have other concerns about taking your medicines.  Review your medicines regularly with your health care provider. Other medicines, diet, medical conditions, weight changes, and daily habits can all affect how medicines work. Ask if you need to continue taking each medicine, and discuss how well each one is working.  Refill your medicines early to avoid running out of them.  In case of an accidental overdose, call your local [place] at [phone] or visit your local emergency department immediately. This is important. Summary  Taking your medicines correctly is an important part of managing or preventing medical problems.  You need to make sure that you understand what you are taking a medicine for, as well as how and when you need to take it.  Know your medicines and use a pill organizer to help you take your medicines correctly.  In case of an accidental overdose, call your local [place] at [phone] or visit your local emergency department immediately. This is important. This information is not intended to replace advice given to you by your health  care provider. Make sure you discuss any questions you have with your health care provider. Document Revised: 01/30/2017 Document Reviewed: 01/30/2017 Elsevier Patient Education  2021 [employer].

## 2020-07-10 NOTE — Progress Notes (Signed)
Encounter details: CCM Time Spent       Value Time User   Time spent with patient (minutes)  10 07/10/2020 12:21 PM [name], [facility]   Time spent performing Chart review  30 07/10/2020 12:21 PM [name], [facility]   Total time (minutes)  40 07/10/2020 12:21 PM [name], RPH      Moderate to High Complex Decision Making     None      CCM Services: This encounter meets complex CCM services and moderate to high decision making.  Prior to outreach and patient consent for Chronic Care Management, I referred this patient for services after reviewing the nominated patient list or from a personal encounter with the patient.  I have personally reviewed this encounter including the documentation in this note and have collaborated with the care management provider regarding care management and care coordination activities to include development and update of the comprehensive care plan. I am certifying that I agree with the content of this note and encounter as supervising physician.

## 2020-07-10 NOTE — Progress Notes (Signed)
Chronic Care Management Pharmacy Note  07/10/2020 Name:  ROBET CRUTCHFIELD MRN:  [id number] DOB:  [birth year]  Subjective: Aaron Stafford is an 86 y.o. year old male who is a primary patient of Ria Bush, MD.  The CCM team was consulted for assistance with disease management and care coordination needs.    Engaged with patient by telephone for follow up visit in response to provider referral for pharmacy case management and/or care coordination services.  Patient reports his neuropathy is much improved with duloxetine. He has not had any recent flare ups. He is also monitoring his BP regularly, checking daily.  Consent to Services:  The patient was given information about Chronic Care Management services, agreed to services, and gave verbal consent prior to initiation of services.  Please see initial visit note for detailed documentation.   Patient Care Team: Ria Bush, MD as PCP - General (Family Medicine) Kem Parkinson, MD as Consulting Physician (Ophthalmology) Rockey Situ Kathlene November, MD as Consulting Physician (Cardiology) [name], [facility] as Pharmacist (Pharmacist)   Recent office visits: 05/24/20 - CCM call, Peripheral neuropathy - Start duloxetine 30 mg daily, patient self-d/c losartan by accident. Monitor BP. 02/23/20- Dr. Danise Mina, PCP - DM stable on metformin 500 mg BID, marked neuropathy, continue gabapentin 600 mg BID and alpha lipoic acid, BP elevated today, monitor at home   Recent consult visits:  05/10/20- Marrianne Mood, PA-C- Cardiology - CAD s/p bypass, no symptoms of angina, continue current medications, BP elevated today overall well controlled. Call if SBP > 130s-140s on consistent basis. RTC 1 year  04/07/20 - CCM call, Neuropathy - Increase gabapentin to 300 mg - take 2-3 capsules twice daily   Hospital visits: ED on 12/18/19 due to leg pain, [facility].  Given oxycodone short term with relief. Pt was not admitted.  Objective:  Lab Results   Component Value Date   CREATININE 1.10 10/08/2019   BUN 25 (H) 10/08/2019   GFR 73.83 08/13/2019   GFRNONAA >60 10/08/2019   GFRAA >60 10/08/2019   NA 135 10/08/2019   K 4.5 10/08/2019   CALCIUM 9.5 10/08/2019   CO2 27 10/08/2019   GLUCOSE 149 (H) 10/08/2019    Lab Results  Component Value Date/Time   HGBA1C 6.3 (A) 02/23/2020 09:17 AM   HGBA1C 6.5 08/13/2019 08:14 AM   HGBA1C 6.5 (A) 02/03/2019 09:10 AM   HGBA1C 6.9 (H) 08/07/2018 10:13 AM   GFR 73.83 08/13/2019 08:14 AM   GFR 74.90 08/07/2018 10:13 AM   MICROALBUR 3.4 (H) 02/21/2014 08:43 AM   MICROALBUR 2.3 (H) 10/12/2010 08:50 AM    Last diabetic Eye exam:  Lab Results  Component Value Date/Time   HMDIABEYEEXA No Retinopathy 12/15/2019 12:00 AM    Last diabetic Foot exam:  Completed by PCP 02/23/20  Lab Results  Component Value Date   CHOL 149 08/13/2019   HDL 36.80 (L) 08/13/2019   LDLCALC 49 08/05/2013   LDLDIRECT 75.0 08/13/2019   TRIG 242.0 (H) 08/13/2019   CHOLHDL 4 08/13/2019    Hepatic Function Latest Ref Rng & Units 10/08/2019 08/13/2019 08/07/2018  Total Protein 6.5 - 8.1 g/dL 7.7 6.4 6.5  Albumin 3.5 - 5.0 g/dL 4.2 4.2 4.3  AST 15 - 41 U/L 32 16 14  ALT 0 - 44 U/L $Remo'25 15 18  'BrgjX$ Alk Phosphatase 38 - 126 U/L 50 50 57  Total Bilirubin 0.3 - 1.2 mg/dL 1.0 0.7 0.7  Bilirubin, Direct 0.0 - 0.3 mg/dL - - -  Lab Results  Component Value Date/Time   TSH 1.61 10/05/2015 03:37 PM   TSH 2.143 04/06/2010 09:33 PM    CBC Latest Ref Rng & Units 10/08/2019 10/05/2015 07/16/2011  WBC 4.0 - 10.5 K/uL 6.6 8.0 7.3  Hemoglobin 13.0 - 17.0 g/dL 14.9 15.2 15.7  Hematocrit 39.0 - 52.0 % 43.4 44.3 46.3  Platelets 150 - 400 K/uL 173 216.0 211.0    Lab Results  Component Value Date/Time   VD25OH 31.12 08/13/2019 08:14 AM   VD25OH 39.00 08/07/2018 10:13 AM    Clinical ASCVD: Yes - s/p CABG The ASCVD Risk score Mikey Bussing DC Jr., et al., 2013) failed to calculate for the following reasons:   The 2013 ASCVD risk score is  only valid for ages 45 to 54    Depression screen PHQ 2/9 02/23/2020 08/13/2019 08/07/2018  Decreased Interest 0 0 0  Down, Depressed, Hopeless 0 0 0  PHQ - 2 Score 0 0 0  Altered sleeping - 0 0  Tired, decreased energy - 0 0  Change in appetite - 0 0  Feeling bad or failure about yourself  - 0 0  Trouble concentrating - 0 0  Moving slowly or fidgety/restless - 0 0  Suicidal thoughts - 0 0  PHQ-9 Score - 0 0  Difficult doing work/chores - Not difficult at all Not difficult at all    Social History   Tobacco Use  Smoking Status Never Smoker  Smokeless Tobacco Never Used   BP Readings from Last 3 Encounters:  05/10/20 (!) 147/66  02/23/20 (!) 158/68  12/18/19 (!) 156/71   Pulse Readings from Last 3 Encounters:  05/10/20 (!) 56  02/23/20 61  12/18/19 (!) 54   Wt Readings from Last 3 Encounters:  05/10/20 171 lb (77.6 kg)  02/23/20 174 lb 3.2 oz (79 kg)  12/18/19 172 lb (78 kg)   BMI Readings from Last 3 Encounters:  05/10/20 27.60 kg/m  02/23/20 28.12 kg/m  12/18/19 25.40 kg/m    Assessment/Interventions: Review of patient past medical history, allergies, medications, health status, including review of consultants reports, laboratory and other test data, was performed as part of comprehensive evaluation and provision of chronic care management services.   SDOH:  (Social Determinants of Health) assessments and interventions performed: Yes SDOH Interventions   Flowsheet Row Most Recent Value  SDOH Interventions   Financial Strain Interventions Intervention Not Indicated     SDOH Screenings   Alcohol Screen: Low Risk   . Last Alcohol Screening Score (AUDIT): 0  Depression (PHQ2-9): Low Risk   . PHQ-2 Score: 0  Financial Resource Strain: Low Risk   . Difficulty of Paying Living Expenses: Not very hard  Food Insecurity: No Food Insecurity  . Worried About Charity fundraiser in the Last Year: Never true  . Ran Out of Food in the Last Year: Never true  Housing:  Low Risk   . Last Housing Risk Score: 0  Physical Activity: Inactive  . Days of Exercise per Week: 0 days  . Minutes of Exercise per Session: 0 min  Social Connections: Not on file  Stress: No Stress Concern Present  . Feeling of Stress : Not at all  Tobacco Use: Low Risk   . Smoking Tobacco Use: Never Smoker  . Smokeless Tobacco Use: Never Used  Transportation Needs: No Transportation Needs  . Lack of Transportation (Medical): No  . Lack of Transportation (Non-Medical): No    CCM Care Plan  Allergies  Allergen Reactions  .  Morphine Sulfate     REACTION: Hallucinations  . Nitroglycerin     REACTION: Decreased BP  . Contrast Media [Iodinated Diagnostic Agents] Rash    Severe hives    Medications Reviewed Today    Reviewed by [name], [facility] (Pharmacist) on 07/10/20 at 1124  Med List Status: <None>  Medication Order Taking? Sig Documenting Provider Last Dose Status Informant  Alpha-Lipoic Acid 600 MG CAPS [id number] Yes Take 1 capsule (600 mg total) by mouth 2 (two) times daily.  Patient taking differently: Take 600 mg by mouth daily.   Ria Bush, MD Taking Active Self  amLODipine (NORVASC) 5 MG tablet [id number] Yes TAKE 1 TABLET BY MOUTH EVERY DAY Ria Bush, MD Taking Active   aspirin 81 MG tablet [id number] Yes Take 81 mg by mouth daily. [provider] Taking Active   Blood Glucose Monitoring Suppl ([place]) w/Device KIT [id number] Yes Check blood sugar once daily Ria Bush, MD Taking Active   Cholecalciferol (VITAMIN D3) 1000 UNITS CAPS [id number] Yes Take 1 capsule (1,000 Units total) by mouth daily. Ria Bush, MD Taking Active Self  Cranberry 500 MG CAPS [id number] Yes Take 2 capsules (1,000 mg total) by mouth daily. Ria Bush, MD Taking Active   diclofenac Sodium (VOLTAREN) 1 % GEL [id number] Yes Apply 2 g topically 3 (three) times daily. Ria Bush, MD Taking Active   DULoxetine (CYMBALTA) 30 MG  capsule [id number] Yes TAKE 1 CAPSULE BY MOUTH EVERY DAY Ria Bush, MD Taking Active   finasteride (PROSCAR) 5 MG tablet [id number] Yes TAKE 1 TABLET BY MOUTH EVERY DAY Ria Bush, MD Taking Active   fluticasone [facility]) 50 MCG/ACT nasal spray [id number] Yes Place 2 sprays into both nostrils daily. Ria Bush, MD Taking Active   furosemide (LASIX) 20 MG tablet [id number] Yes TAKE 1 TABLET BY MOUTH EVERY DAY Ria Bush, MD Taking Active   gabapentin (NEURONTIN) 300 MG capsule [id number] Yes Take 2-3 capsules (600-900 mg total) by mouth in the morning and at bedtime. Ria Bush, MD Taking Active   glucose blood [facility] VERIO) test strip [id number] Yes Check blood sugar once daily Ria Bush, MD Taking Active   hydrochlorothiazide (HYDRODIURIL) 25 MG tablet [id number] Yes TAKE 1 TABLET BY MOUTH EVERY DAY Ria Bush, MD Taking Active   metFORMIN (GLUCOPHAGE) 500 MG tablet [id number] Yes TAKE 1 TABLET (500 MG TOTAL) BY MOUTH 2 (TWO) TIMES DAILY WITH A MEAL. Ria Bush, MD Taking Active   OneTouch [place]ica Lancets 93X [place] [id number] Yes Check blood sugar once daily Ria Bush, MD Taking Active   pravastatin (PRAVACHOL) 40 MG tablet [id number] Yes TAKE 1 TABLET BY MOUTH EVERYDAY AT BEDTIME Ria Bush, MD Taking Active   traZODone (DESYREL) 50 MG tablet [id number] Yes TAKE 1 TO 1 AND 1/2 TABLETS BY MOUTH AT BEDTIME AS NEEDED FOR SLEEP Ria Bush, MD Taking Active   valACYclovir (VALTREX) 1000 MG tablet [id number] Yes TAKE 2 TABLETS (2,000 MG TOTAL) BY MOUTH 2 (TWO) TIMES DAILY. FOR 1 DAY Ria Bush, MD Taking Active   vitamin B-12 (CYANOCOBALAMIN) 250 MCG tablet [id number] Yes Take 250 mcg by mouth once a week.  [provider] Taking Active           Patient Active Problem List   Diagnosis Date Noted  . Right hand pain 08/18/2019  . Tremor 08/14/2018  . Urinary urgency 03/11/2017  . Peripheral neuropathy 10/05/2015  .  Advanced care planning/counseling discussion 06/26/2014  . Stress 08/09/2013  . BPH with obstruction/lower urinary  tract symptoms   . Medicare annual wellness visit, subsequent 03/25/2011  . S/P CABG (coronary artery bypass graft) 06/07/2010  . HTN (hypertension)   . Hyperlipidemia associated with type 2 diabetes mellitus ([place])   . Type 2 diabetes, controlled, with peripheral neuropathy ([place])   . GERD (gastroesophageal reflux disease)   . Chronic rhinitis   . Vitamin D deficiency 04/11/2008  . Insomnia 03/14/2008  . Overweight with body mass index (BMI) 25.0-29.9 07/03/2006  . ERECTILE DYSFUNCTION 07/01/2006  . Coronary atherosclerosis 07/01/2006  . IRRITABLE BOWEL SYNDROME 07/01/2006    Immunization History  Administered Date(s) Administered  . Influenza Split 11/21/2011  . Influenza Whole 11/19/2003, 03/01/2009  . Influenza, High Dose Seasonal PF 10/03/2016, 10/27/2017, 12/03/2018  . Influenza,inj,Quad PF,6+ Mos 11/23/2012, 02/15/2014, 12/14/2014, 12/21/2015  . PFIZER(Purple Top)SARS-COV-2 Vaccination 02/23/2019, 03/16/2019  . Pneumococcal Conjugate-13 08/09/2013  . Pneumococcal Polysaccharide-23 05/10/2004  . Td 12/26/1997, 04/12/2009  . Zoster 11/15/2010    Conditions to be addressed/monitored:  Hypertension, Hyperlipidemia, Diabetes and Neuropathy  Care Plan : [place]  Updates made by [name], [facility] since 07/10/2020 12:00 AM    Problem: [place]   Priority: High  Note:   Current Barriers:  . None identified   Pharmacist Clinical Goal(s):  Marland Kitchen Patient will contact office with questions or concerns and continue current therapy   Interventions: . 1:1 collaboration with Ria Bush, MD regarding development and update of comprehensive plan of care as evidenced by provider attestation and co-signature . Inter-disciplinary care team collaboration (see longitudinal plan of care) . Comprehensive medication review performed;  medication list updated in electronic medical record  Hypertension (BP goal <140/90) -Controlled - home blood pressure readings controlled -Current treatment: . Amlodipine 5 mg - 1 tablet daily  . HCTZ 25 mg - 1 tablet daily -Medications previously tried: losartan  -Current home readings: checking daily since 06/09/20 - BP ranging 120-143,70s  -Denies hypotensive/hypertensive symptoms -Counseled to continue monitoring BP at home once monthly and with symptoms of dizziness, document, and provide log at future appointments -Recommended to continue current medication; Okay to remain off losartan.  Hyperlipidemia: (LDL goal < 70) -LDL near goal - 75 -Current treatment: . Pravastatin 40 mg - 1 tablet daily (evening) -Medications previously tried: none  -No updates/changes 07/10/20 -Recommended to continue current medication  Diabetes (A1c goal <7%) -Controlled - A1c 6.3% -Current medications: Marland Kitchen Metformin 500 mg - 1 tablet twice daily meals   -Medications previously tried: none  -Current home glucose readings - not checking routinely, he has not received new meter. -Pt requesting new rx for blood sugar - One Touch Ultra 2 (send to [facility]); Test Strips and Lancets. Reviewed chart and this was sent to [facility] 05/24/20. -No changes in diet or exercise, active lifestyle and balanced diet -Recommended to continue current medication; Patient will call if [facility] does not have prescription for glucometer and supplies and we can resend.  Neuropathy (Goal: Control symptoms) -Controlled - per patient report -States tolerating duloxetine well and symptoms are controlled. Denies any recent flares.  -Current treatment  . Alpha lipoic acid 600 mg - 1 daily . Gabapentin 300 mg - 2-3 capsules BID  . Duloxetine 30 mg - 1 daily at bedtime  -Medications previously tried: Lyrica - short term, pt was concerned about side effects. -Recommended continue current medication; Follow up with PCP in July.  Call if any changes in pain control.  Patient Goals/Self-Care Activities . Patient will:  -  take medications as prescribed -check blood pressure with abnormal symptoms, document, and provide at future appointments -check blood glucose with any abnormal symptoms, document and provide at future appointments  Follow Up Plan: Telephone follow up appointment with care management team member scheduled for: 4 months   Medication Assistance: None required.  Patient affirms current coverage meets needs.     Star Rating Drugs: Medication:                Last Fill:         Day Supply Metformin 500 mg       05/24/20            90 Pravastatin 40 mg       05/11/20            90  No gaps in adherence  Patient's preferred pharmacy is:  [id number] [place], [place] [address] [place] [address] Phone: [phone] Fax: [phone]  Uses pill box? Yes - 4 time slots/day, fills weekly Pt endorses 100% compliance  Takes all meds in the morning except:  1 Pravastatin  (9 PM) 1 Metformin (9 PM) 1 Trazodone (10 PM) 2 Gabapentin (6 and 8 PM) 1 Aspirin 81 mg (9 PM) 1 Duloxetine 30 mg (9 PM)  OTCs: Vitamin B12 Vitamin D3 Neuro Nirvana (for Memory) Nature Fuel Beet Powder (for overall health) Cinnamon (for diabetes) Cranberry capsules - once daily (Urinary tract)   PRN: Valtrex x 1 day for cold sore   [facility], [facility]. Happy with current pharmacy. We discussed: Benefits of medication synchronization, packaging and delivery as well as enhanced pharmacist oversight with [facility]. Patient decided to: Continue current medication management strategy  Care Plan and Follow Up Patient Decision:  Patient agrees to Care Plan and Follow-up.  [name], PharmD Clinical Pharmacist [facility] [phone]

## 2020-08-01 DIAGNOSIS — H9201 Otalgia, right ear: Secondary | ICD-10-CM | POA: Diagnosis not present

## 2020-08-01 DIAGNOSIS — R221 Localized swelling, mass and lump, neck: Secondary | ICD-10-CM | POA: Diagnosis not present

## 2020-08-13 ENCOUNTER — Other Ambulatory Visit: Payer: Self-pay | Admitting: Family Medicine

## 2020-08-13 DIAGNOSIS — N138 Other obstructive and reflux uropathy: Secondary | ICD-10-CM

## 2020-08-13 DIAGNOSIS — E1142 Type 2 diabetes mellitus with diabetic polyneuropathy: Secondary | ICD-10-CM

## 2020-08-13 DIAGNOSIS — E1169 Type 2 diabetes mellitus with other specified complication: Secondary | ICD-10-CM

## 2020-08-13 DIAGNOSIS — E559 Vitamin D deficiency, unspecified: Secondary | ICD-10-CM

## 2020-08-13 DIAGNOSIS — E785 Hyperlipidemia, unspecified: Secondary | ICD-10-CM

## 2020-08-15 ENCOUNTER — Ambulatory Visit (INDEPENDENT_AMBULATORY_CARE_PROVIDER_SITE_OTHER): Payer: Medicare Other

## 2020-08-15 DIAGNOSIS — Z0000 Encounter for general adult medical examination without abnormal findings: Secondary | ICD-10-CM | POA: Diagnosis not present

## 2020-08-15 NOTE — Patient Instructions (Signed)
[name] , Thank you for taking time to come for your Medicare Wellness Visit. I appreciate your ongoing commitment to your health goals. Please review the following plan we discussed and let me know if I can assist you in the future.   Screening recommendations/referrals: Colonoscopy: no longer required  Recommended yearly ophthalmology/optometry visit for glaucoma screening and checkup Recommended yearly dental visit for hygiene and checkup  Vaccinations: Influenza vaccine: Up to date, completed 01/25/2020, due 09/2020 Pneumococcal vaccine: Completed series Tdap vaccine: insurance  Shingles vaccine: due, check with your insurance regarding coverage if interested    Covid-19: Completed 3 vaccines, Please bring card so booster can be documented in chart  Advanced directives: Please bring a copy of your POA (Power of [place]) and/or Living Will to your next appointment.   Conditions/risks identified: diabetes, hyperlipidemia   Next appointment: Follow up in one year for your annual wellness visit.   Preventive Care 18 Years and Older, Male Preventive care refers to lifestyle choices and visits with your health care provider that can promote health and wellness. What does preventive care include? A yearly physical exam. This is also called an annual well check. Dental exams once or twice a year. Routine eye exams. Ask your health care provider how often you should have your eyes checked. Personal lifestyle choices, including: Daily care of your teeth and gums. Regular physical activity. Eating a healthy diet. Avoiding tobacco and drug use. Limiting alcohol use. Practicing safe sex. Taking low doses of aspirin every day. Taking vitamin and mineral supplements as recommended by your health care provider. What happens during an annual well check? The services and screenings done by your health care provider during your annual well check will depend on your age, overall health,  lifestyle risk factors, and family history of disease. Counseling  Your health care provider may ask you questions about your: Alcohol use. Tobacco use. Drug use. Emotional well-being. Home and relationship well-being. Sexual activity. Eating habits. History of falls. Memory and ability to understand (cognition). Work and work Statistician. Screening  You may have the following tests or measurements: Height, weight, and BMI. Blood pressure. Lipid and cholesterol levels. These may be checked every 5 years, or more frequently if you are over 59 years old. Skin check. Lung cancer screening. You may have this screening every year starting at age 30 if you have a 30-pack-year history of smoking and currently smoke or have quit within the past 15 years. Fecal occult blood test (FOBT) of the stool. You may have this test every year starting at age 25. Flexible sigmoidoscopy or colonoscopy. You may have a sigmoidoscopy every 5 years or a colonoscopy every 10 years starting at age 53. Prostate cancer screening. Recommendations will vary depending on your family history and other risks. Hepatitis C blood test. Hepatitis B blood test. Sexually transmitted disease (STD) testing. Diabetes screening. This is done by checking your blood sugar (glucose) after you have not eaten for a while (fasting). You may have this done every 1-3 years. Abdominal aortic aneurysm (AAA) screening. You may need this if you are a current or former smoker. Osteoporosis. You may be screened starting at age 50 if you are at high risk. Talk with your health care provider about your test results, treatment options, and if necessary, the need for more tests. Vaccines  Your health care provider may recommend certain vaccines, such as: Influenza vaccine. This is recommended every year. Tetanus, diphtheria, and acellular pertussis (Tdap, Td) vaccine. You may  need a Td booster every 10 years. Zoster vaccine. You may need this  after age 77. Pneumococcal 13-valent conjugate (PCV13) vaccine. One dose is recommended after age 51. Pneumococcal polysaccharide (PPSV23) vaccine. One dose is recommended after age 74. Talk to your health care provider about which screenings and vaccines you need and how often you need them. This information is not intended to replace advice given to you by your health care provider. Make sure you discuss any questions you have with your health care provider. Document Released: 03/03/2015 Document Revised: 10/25/2015 Document Reviewed: 12/06/2014 Elsevier Interactive Patient Education  2017 [employer] Prevention in the Home Falls can cause injuries. They can happen to people of all ages. There are many things you can do to make your home safe and to help prevent falls. What can I do on the outside of my home? Regularly fix the edges of walkways and driveways and fix any cracks. Remove anything that might make you trip as you walk through a door, such as a raised step or threshold. Trim any bushes or trees on the path to your home. Use bright outdoor lighting. Clear any walking paths of anything that might make someone trip, such as rocks or tools. Regularly check to see if handrails are loose or broken. Make sure that both sides of any steps have handrails. Any raised decks and porches should have guardrails on the edges. Have any leaves, snow, or ice cleared regularly. Use sand or salt on walking paths during winter. Clean up any spills in your garage right away. This includes oil or grease spills. What can I do in the bathroom? Use night lights. Install grab bars by the toilet and in the tub and shower. Do not use towel bars as grab bars. Use non-skid mats or decals in the tub or shower. If you need to sit down in the shower, use a plastic, non-slip stool. Keep the floor dry. Clean up any water that spills on the floor as soon as it happens. Remove soap buildup in the tub or  shower regularly. Attach bath mats securely with double-sided non-slip rug tape. Do not have throw rugs and other things on the floor that can make you trip. What can I do in the bedroom? Use night lights. Make sure that you have a light by your bed that is easy to reach. Do not use any sheets or blankets that are too big for your bed. They should not hang down onto the floor. Have a firm chair that has side arms. You can use this for support while you get dressed. Do not have throw rugs and other things on the floor that can make you trip. What can I do in the kitchen? Clean up any spills right away. Avoid walking on wet floors. Keep items that you use a lot in easy-to-reach places. If you need to reach something above you, use a strong step stool that has a grab bar. Keep electrical cords out of the way. Do not use floor polish or wax that makes floors slippery. If you must use wax, use non-skid floor wax. Do not have throw rugs and other things on the floor that can make you trip. What can I do with my stairs? Do not leave any items on the stairs. Make sure that there are handrails on both sides of the stairs and use them. Fix handrails that are broken or loose. Make sure that handrails are as long as the stairways.  Check any carpeting to make sure that it is firmly attached to the stairs. Fix any carpet that is loose or worn. Avoid having throw rugs at the top or bottom of the stairs. If you do have throw rugs, attach them to the floor with carpet tape. Make sure that you have a light switch at the top of the stairs and the bottom of the stairs. If you do not have them, ask someone to add them for you. What else can I do to help prevent falls? Wear shoes that: Do not have high heels. Have rubber bottoms. Are comfortable and fit you well. Are closed at the toe. Do not wear sandals. If you use a stepladder: Make sure that it is fully opened. Do not climb a closed stepladder. Make  sure that both sides of the stepladder are locked into place. Ask someone to hold it for you, if possible. Clearly mark and make sure that you can see: Any grab bars or handrails. First and last steps. Where the edge of each step is. Use tools that help you move around (mobility aids) if they are needed. These include: Canes. Walkers. Scooters. Crutches. Turn on the lights when you go into a dark area. Replace any light bulbs as soon as they burn out. Set up your furniture so you have a clear path. Avoid moving your furniture around. If any of your floors are uneven, fix them. If there are any pets around you, be aware of where they are. Review your medicines with your doctor. Some medicines can make you feel dizzy. This can increase your chance of falling. Ask your doctor what other things that you can do to help prevent falls. This information is not intended to replace advice given to you by your health care provider. Make sure you discuss any questions you have with your health care provider. Document Released: 12/01/2008 Document Revised: 07/13/2015 Document Reviewed: 03/11/2014 Elsevier Interactive Patient Education  2017 [employer].

## 2020-08-15 NOTE — Progress Notes (Signed)
Subjective:   Aaron Stafford is a 86 y.o. male who presents for Medicare Annual/Subsequent preventive examination.  Review of Systems: N/A      I connected with the patient today by telephone and verified that I am speaking with the correct person using two identifiers. Location patient: home Location nurse: work Persons participating in the telephone visit: patient, nurse.   I discussed the limitations, risks, security and privacy concerns of performing an evaluation and management service by telephone and the availability of in person appointments. I also discussed with the patient that there may be a patient responsible charge related to this service. The patient expressed understanding and verbally consented to this telephonic visit.        Cardiac Risk Factors include: advanced age (>68men, >55 women);male gender;diabetes mellitus;Other (see comment), Risk factor comments: hyperlipidemia     Objective:    Today's Vitals   There is no height or weight on file to calculate BMI.  Advanced Directives 08/15/2020 12/18/2019 10/08/2019 08/13/2019 08/07/2018 03/04/2017 12/26/2015  Does Patient Have a Medical Advance Directive? Yes No Yes Yes Yes Yes Yes  Type of Estate agent of [place];Living will - Healthcare Power of [place];Living will Healthcare Power of [place];Living will Living will;Healthcare Power of [employer] Power of [place];Living will Healthcare Power of [place];Living will  Does patient want to make changes to medical advance directive? - - - - No - Patient declined - No - Patient declined  Copy of Healthcare Power of Attorney in Chart? No - copy requested - - No - copy requested No - copy requested Yes No - copy requested    Current Medications (verified) Outpatient Encounter Medications as of 08/15/2020  Medication Sig   Alpha-Lipoic Acid 600 MG CAPS Take 1 capsule (600 mg total) by mouth 2 (two) times daily. (Patient taking  differently: Take 600 mg by mouth daily.)   amLODipine (NORVASC) 5 MG tablet TAKE 1 TABLET BY MOUTH EVERY DAY   aspirin 81 MG tablet Take 81 mg by mouth daily.   Blood Glucose Monitoring Suppl (ONETOUCH VERIO FLEX SYSTEM) w/Device KIT Check blood sugar once daily   Cholecalciferol (VITAMIN D3) 1000 UNITS CAPS Take 1 capsule (1,000 Units total) by mouth daily.   Cranberry 500 MG CAPS Take 2 capsules (1,000 mg total) by mouth daily.   diclofenac Sodium (VOLTAREN) 1 % GEL Apply 2 g topically 3 (three) times daily.   DULoxetine (CYMBALTA) 30 MG capsule TAKE 1 CAPSULE BY MOUTH EVERY DAY   finasteride (PROSCAR) 5 MG tablet TAKE 1 TABLET BY MOUTH EVERY DAY   fluticasone (FLONASE) 50 MCG/ACT nasal spray SPRAY 2 SPRAYS INTO EACH NOSTRIL EVERY DAY   furosemide (LASIX) 20 MG tablet TAKE 1 TABLET BY MOUTH EVERY DAY   gabapentin (NEURONTIN) 300 MG capsule Take 2-3 capsules (600-900 mg total) by mouth in the morning and at bedtime.   glucose blood (ONETOUCH VERIO) test strip Check blood sugar once daily   hydrochlorothiazide (HYDRODIURIL) 25 MG tablet TAKE 1 TABLET BY MOUTH EVERY DAY   metFORMIN (GLUCOPHAGE) 500 MG tablet TAKE 1 TABLET BY MOUTH 2 TIMES DAILY WITH A MEAL.   OneTouch Delica Lancets 30G MISC Check blood sugar once daily   pravastatin (PRAVACHOL) 40 MG tablet TAKE 1 TABLET BY MOUTH EVERYDAY AT BEDTIME   traZODone (DESYREL) 50 MG tablet TAKE 1 TO 1 & 1/2 TABLETS BY MOUTH AT BEDTIME AS NEEDED FOR SLEEP   valACYclovir (VALTREX) 1000 MG tablet TAKE 2 TABLETS (2,000 MG  TOTAL) BY MOUTH 2 (TWO) TIMES DAILY. FOR 1 DAY   vitamin B-12 (CYANOCOBALAMIN) 250 MCG tablet Take 250 mcg by mouth once a week.    No facility-administered encounter medications on file as of 08/15/2020.    Allergies (verified) Morphine sulfate, Nitroglycerin, and Contrast media [iodinated diagnostic agents]   History: Past Medical History:  Diagnosis Date   CAD (coronary artery disease)    with stents   Chronic rhinitis     [facility]   COVID-19 virus infection 09/2019   Diabetes mellitus type II    Diverticulosis 06/2011   by CT and colonoscopy   Fatty liver 06/2011   by CT   GERD (gastroesophageal reflux disease)    History of BPH    History of shingles    HLD (hyperlipidemia)    HTN (hypertension)    Ischemic heart disease    Melanoma ([place]) 12/2015   L forearm, scalp   MI (myocardial infarction) ([place]) 1995   Shingles rash 09/26/2012   Sialadenitis 05/01/2017   Past Surgical History:  Procedure Laterality Date   ANKLE SURGERY  03/2002   Left ankle fracture   CARDIAC CATHETERIZATION     CHOLECYSTECTOMY  1985   COLONOSCOPY     diverticulosis, tortuous colon with looping ([place])   CORONARY ARTERY BYPASS GRAFT  05/1993   x6 MI Attemped angioplasty   CORONARY STENT PLACEMENT  07/25/2009   Multi-Link VISION Cobalt chromium    CYSTOSCOPY  10/03/08   Normal-Dr. Bernardo Heater   INGUINAL HERNIA REPAIR  11/2000   and ventral hernia repair   Family History  Problem Relation Age of Onset   Kidney failure Mother    Coronary artery disease Mother    Heart failure Mother    Hypertension Brother    Heart attack Brother    Alcohol abuse Brother    Hypertension Brother    Hypertension Sister    Social History   Socioeconomic History   Marital status: Married    Spouse name: Not on file   Number of children: 5   Years of education: Not on file   Highest education level: Not on file  Occupational History   Occupation: Theme park manager  Tobacco Use   Smoking status: Never   Smokeless tobacco: Never  Vaping Use   Vaping Use: Never used  Substance and Sexual Activity   Alcohol use: No    Alcohol/week: 0.0 standard drinks   Drug use: No   Sexual activity: Not on file  Other Topics Concern   Not on file  Social History Narrative   Caffeine: 1 cup/day, 2 cups tea daily   Lives with [name] (wife) and 3 dogs   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[place], close [employer] family   Activity: no regular exercise, does walk  some   Diet: good water, fruits/vegetables daily   Social Determinants of Health   Financial Resource Strain: Low Risk    Difficulty of Paying Living Expenses: Not hard at all  Food Insecurity: No Food Insecurity   Worried About Charity fundraiser in the Last Year: Never true   Arboriculturist in the Last Year: Never true  Transportation Needs: No Transportation Needs   Lack of Transportation (Medical): No   Lack of Transportation (Non-Medical): No  Physical Activity: Inactive   Days of Exercise per Week: 0 days   Minutes of Exercise per Session: 0 min  Stress: No Stress Concern Present   Feeling of Stress : Not at all  Social  Connections: Not on file    Tobacco Counseling Counseling given: Not Answered   Clinical Intake:  Pre-visit preparation completed: Yes  Pain : No/denies pain     Nutritional Risks: None Diabetes: Yes CBG done?: No Did pt. bring in CBG monitor from home?: No  How often do you need to have someone help you when you read instructions, pamphlets, or other written materials from your doctor or pharmacy?: 1 - Never  Diabetic: Yes Nutrition Risk Assessment:  Has the patient had any N/V/D within the last 2 months?  No  Does the patient have any non-healing wounds?  No  Has the patient had any unintentional weight loss or weight gain?  No   Diabetes:  Is the patient diabetic?  Yes  If diabetic, was a CBG obtained today?  No  telephone visit  Did the patient bring in their glucometer from home?  No  telephone visit  How often do you monitor your CBG's? sometimes.   Financial Strains and Diabetes Management:  Are you having any financial strains with the device, your supplies or your medication? No .  Does the patient want to be seen by Chronic Care Management for management of their diabetes?  No  Would the patient like to be referred to a Nutritionist or for Diabetic Management?  No   Diabetic Exams:  Diabetic Eye Exam: Completed  12/15/2019 Diabetic Foot Exam: Completed 02/23/2020   Interpreter Needed?: No  Information entered by :: CJohnson, LPN   Activities of Daily Living In your present state of health, do you have any difficulty performing the following activities: 08/15/2020  Hearing? N  Vision? N  Difficulty concentrating or making decisions? N  Walking or climbing stairs? N  Dressing or bathing? N  Doing errands, shopping? N  Preparing Food and eating ? N  Using the Toilet? N  In the past six months, have you accidently leaked urine? N  Do you have problems with loss of bowel control? N  Managing your Medications? N  Managing your Finances? N  Housekeeping or managing your Housekeeping? N  Some recent data might be hidden    Patient Care Team: Ria Bush, MD as PCP - General (Family Medicine) Kem Parkinson, MD as Consulting Physician (Ophthalmology) Rockey Situ Kathlene November, MD as Consulting Physician (Cardiology) [name], [facility] as Pharmacist (Pharmacist)  Indicate any recent Medical Services you may have received from other than [facility] providers in the past year (date may be approximate).     Assessment:   This is a routine wellness examination for Greene.  Hearing/Vision screen Vision Screening - Comments:: Patient gets annual eye exams  Dietary issues and exercise activities discussed: Current Exercise Habits: The patient does not participate in regular exercise at present, Exercise limited by: None identified   Goals Addressed             This Visit's Progress    Patient Stated       08/15/2020, I will maintain and continue medications as prescribed.         Depression Screen PHQ 2/9 Scores 08/15/2020 02/23/2020 08/13/2019 08/07/2018 03/04/2017 12/26/2015 12/14/2014  PHQ - 2 Score 0 0 0 0 0 0 0  PHQ- 9 Score 0 - 0 0 0 - -    Fall Risk Fall Risk  08/15/2020 02/23/2020 08/13/2019 08/07/2018 03/04/2017  Falls in the past year? 1 0 0 0 Yes  Comment - - - - fell off a chair and  hit back of head; no medical  treatment  Number falls in past yr: 0 0 0 - 1  Injury with Fall? 1 0 0 - Yes  Comment hit his head - - - -  Risk for fall due to : Medication side effect - Medication side effect - -  Follow up Falls evaluation completed;Falls prevention discussed Falls evaluation completed Falls evaluation completed;Falls prevention discussed - -    FALL RISK PREVENTION PERTAINING TO THE HOME:  Any stairs in or around the home? Yes  If so, are there any without handrails? No  Home free of loose throw rugs in walkways, pet beds, electrical cords, etc? Yes  Adequate lighting in your home to reduce risk of falls? Yes   ASSISTIVE DEVICES UTILIZED TO PREVENT FALLS:  Life alert? No  Use of a cane, walker or w/c? No  Grab bars in the bathroom? No  Shower chair or bench in shower? No  Elevated toilet seat or a handicapped toilet? No   TIMED UP AND GO:  Was the test performed?  N/A telephone visit .    Cognitive Function: MMSE - Mini Mental State Exam 08/15/2020 08/13/2019 08/07/2018 03/04/2017 12/26/2015  Orientation to time $Remov'5 5 5 5 5  'PohDza$ Orientation to Place $Remove'5 5 5 5 5  'QAACIDB$ Registration $Remov'3 3 3 3 3  'BRbYHp$ Attention/ Calculation 5 4 0 0 0  Recall $Remov'3 3 3 3 3  'UkYQNI$ Language- name 2 objects - - 0 0 0  Language- repeat $RemoveBeforeDE'1 1 1 1 1  'VyHhFXpfSIrxCVb$ Language- follow 3 step command - - 0 3 3  Language- read & follow direction - - 0 0 0  Write a sentence - - 0 0 0  Copy design - - 0 0 0  Total score - - $R'17 20 20  'GH$ Mini Cog  Mini-Cog screen was completed. Maximum score is 22. A value of 0 denotes this part of the MMSE was not completed or the patient failed this part of the Mini-Cog screening.       Immunizations Immunization History  Administered Date(s) Administered   Influenza Split 11/21/2011   Influenza Whole 11/19/2003, 03/01/2009   Influenza, High Dose Seasonal PF 10/03/2016, 10/27/2017, 12/03/2018   Influenza,inj,Quad PF,6+ Mos 11/23/2012, 02/15/2014, 12/14/2014, 12/21/2015   PFIZER(Purple  Top)SARS-COV-2 Vaccination 02/23/2019, 03/16/2019   Pneumococcal Conjugate-13 08/09/2013   Pneumococcal Polysaccharide-23 05/10/2004   Td 12/26/1997, 04/12/2009   Zoster, Live 11/15/2010    TDAP status: Due, Education has been provided regarding the importance of this vaccine. Advised may receive this vaccine at local pharmacy or Health Dept. Aware to provide a copy of the vaccination record if obtained from local pharmacy or Health Dept. Verbalized acceptance and understanding.  Flu Vaccine status: Up to date  Pneumococcal vaccine status: Up to date  Covid-19 vaccine status: Completed 3 vaccines, Will bring card so booster can be documented in chart  Qualifies for Shingles Vaccine? Yes   Zostavax completed Yes   Shingrix Completed?: No.    Education has been provided regarding the importance of this vaccine. Patient has been advised to call insurance company to determine out of pocket expense if they have not yet received this vaccine. Advised may also receive vaccine at local pharmacy or Health Dept. Verbalized acceptance and understanding.  Screening Tests Health Maintenance  Topic Date Due   Zoster Vaccines- Shingrix (1 of 2) Never done   URINE MICROALBUMIN  02/22/2015   COVID-19 Vaccine (3 - Booster for Pfizer series) 08/14/2019   TETANUS/TDAP  08/13/2023 (Originally 04/13/2019)   HEMOGLOBIN A1C  08/22/2020   INFLUENZA VACCINE  09/18/2020   OPHTHALMOLOGY EXAM  12/14/2020   FOOT EXAM  02/22/2021   PNA vac Low Risk Adult  Completed   HPV VACCINES  Aged Out    Health Maintenance  Health Maintenance Due  Topic Date Due   Zoster Vaccines- Shingrix (1 of 2) Never done   URINE MICROALBUMIN  02/22/2015   COVID-19 Vaccine (3 - Booster for Pfizer series) 08/14/2019    Colorectal cancer screening: No longer required.   Lung Cancer Screening: (Low Dose CT Chest recommended if Age 82-80 years, 30 pack-year currently smoking OR have quit w/in 15years.) does not qualify.     Additional Screening:  Hepatitis C Screening: does not qualify; Completed N/A  Vision Screening: Recommended annual ophthalmology exams for early detection of glaucoma and other disorders of the eye. Is the patient up to date with their annual eye exam?  Yes  Who is the provider or what is the name of the office in which the patient attends annual eye exams? Dr. Ellin Mayhew If pt is not established with a provider, would they like to be referred to a provider to establish care? No .   Dental Screening: Recommended annual dental exams for proper oral hygiene  Community Resource Referral / Chronic Care Management: CRR required this visit?  No   CCM required this visit?  No      Plan:     I have personally reviewed and noted the following in the patient's chart:   Medical and social history Use of alcohol, tobacco or illicit drugs  Current medications and supplements including opioid prescriptions. Patient is not currently taking opioid prescriptions. Functional ability and status Nutritional status Physical activity Advanced directives List of other physicians Hospitalizations, surgeries, and ER visits in previous 12 months Vitals Screenings to include cognitive, depression, and falls Referrals and appointments  In addition, I have reviewed and discussed with patient certain preventive protocols, quality metrics, and best practice recommendations. A written personalized care plan for preventive services as well as general preventive health recommendations were provided to patient.   Due to this being a telephonic visit, the after visit summary with patients personalized plan was offered to patient via office or my-chart. Patient preferred to pick up at office at next visit or via mychart.   Andrez Grime, LPN   [id number]

## 2020-08-15 NOTE — Progress Notes (Signed)
PCP notes:  Health Maintenance: Shingrix- due   Abnormal Screenings: none   Patient concerns: [place] and hit his head on a rock back in the winter. Has since been having pain on the right side of his head and face.    Nurse concerns: none   Next PCP appt.: 08/22/2020 @ 9 am

## 2020-08-16 ENCOUNTER — Other Ambulatory Visit (INDEPENDENT_AMBULATORY_CARE_PROVIDER_SITE_OTHER): Payer: Medicare Other

## 2020-08-16 ENCOUNTER — Other Ambulatory Visit: Payer: Self-pay

## 2020-08-16 DIAGNOSIS — E1142 Type 2 diabetes mellitus with diabetic polyneuropathy: Secondary | ICD-10-CM | POA: Diagnosis not present

## 2020-08-16 DIAGNOSIS — E559 Vitamin D deficiency, unspecified: Secondary | ICD-10-CM

## 2020-08-16 DIAGNOSIS — E1169 Type 2 diabetes mellitus with other specified complication: Secondary | ICD-10-CM

## 2020-08-16 DIAGNOSIS — E785 Hyperlipidemia, unspecified: Secondary | ICD-10-CM

## 2020-08-16 LAB — COMPREHENSIVE METABOLIC PANEL
ALT: 18 U/L (ref 0–53)
AST: 18 U/L (ref 0–37)
Albumin: 4.1 g/dL (ref 3.5–5.2)
Alkaline Phosphatase: 55 U/L (ref 39–117)
BUN: 18 mg/dL (ref 6–23)
CO2: 31 mEq/L (ref 19–32)
Calcium: 10 mg/dL (ref 8.4–10.5)
Chloride: 98 mEq/L (ref 96–112)
Creatinine, Ser: 0.98 mg/dL (ref 0.40–1.50)
GFR: 70.82 mL/min (ref >60.00)
Glucose, Bld: 130 mg/dL — ABNORMAL HIGH (ref 70–99)
Potassium: 4.1 mEq/L (ref 3.5–5.1)
Sodium: 138 mEq/L (ref 135–145)
Total Bilirubin: 0.7 mg/dL (ref 0.2–1.2)
Total Protein: 7 g/dL (ref 6.0–8.3)

## 2020-08-16 LAB — VITAMIN D 25 HYDROXY (VIT D DEFICIENCY, FRACTURES): VITD: 29.11 ng/mL — ABNORMAL LOW (ref 30.00–100.00)

## 2020-08-16 LAB — LIPID PANEL
Cholesterol: 140 mg/dL (ref 0–200)
HDL: 37 mg/dL — ABNORMAL LOW (ref >39.00)
NonHDL: 102.76
Total CHOL/HDL Ratio: 4
Triglycerides: 268 mg/dL — ABNORMAL HIGH (ref 0.0–149.0)
VLDL: 53.6 mg/dL — ABNORMAL HIGH (ref 0.0–40.0)

## 2020-08-16 LAB — MICROALBUMIN / CREATININE URINE RATIO
Creatinine,U: 174 mg/dL
Microalb Creat Ratio: 2.3 mg/g (ref 0.0–30.0)
Microalb, Ur: 4 mg/dL — ABNORMAL HIGH (ref 0.0–1.9)

## 2020-08-16 LAB — HEMOGLOBIN A1C: Hgb A1c MFr Bld: 7.2 % — ABNORMAL HIGH (ref 4.6–6.5)

## 2020-08-16 LAB — LDL CHOLESTEROL, DIRECT: Direct LDL: 70 mg/dL

## 2020-08-22 ENCOUNTER — Other Ambulatory Visit: Payer: Self-pay

## 2020-08-22 ENCOUNTER — Ambulatory Visit (INDEPENDENT_AMBULATORY_CARE_PROVIDER_SITE_OTHER): Payer: Medicare Other | Admitting: Family Medicine

## 2020-08-22 ENCOUNTER — Encounter: Payer: Self-pay | Admitting: Family Medicine

## 2020-08-22 VITALS — BP 140/66 | HR 63 | Temp 97.5°F | Ht 66.25 in | Wt 175.0 lb

## 2020-08-22 DIAGNOSIS — J310 Chronic rhinitis: Secondary | ICD-10-CM

## 2020-08-22 DIAGNOSIS — N401 Enlarged prostate with lower urinary tract symptoms: Secondary | ICD-10-CM

## 2020-08-22 DIAGNOSIS — E785 Hyperlipidemia, unspecified: Secondary | ICD-10-CM

## 2020-08-22 DIAGNOSIS — G6289 Other specified polyneuropathies: Secondary | ICD-10-CM | POA: Diagnosis not present

## 2020-08-22 DIAGNOSIS — I25118 Atherosclerotic heart disease of native coronary artery with other forms of angina pectoris: Secondary | ICD-10-CM | POA: Diagnosis not present

## 2020-08-22 DIAGNOSIS — E1169 Type 2 diabetes mellitus with other specified complication: Secondary | ICD-10-CM | POA: Diagnosis not present

## 2020-08-22 DIAGNOSIS — E1142 Type 2 diabetes mellitus with diabetic polyneuropathy: Secondary | ICD-10-CM

## 2020-08-22 DIAGNOSIS — E663 Overweight: Secondary | ICD-10-CM | POA: Diagnosis not present

## 2020-08-22 DIAGNOSIS — N138 Other obstructive and reflux uropathy: Secondary | ICD-10-CM

## 2020-08-22 DIAGNOSIS — K5909 Other constipation: Secondary | ICD-10-CM

## 2020-08-22 DIAGNOSIS — I10 Essential (primary) hypertension: Secondary | ICD-10-CM

## 2020-08-22 DIAGNOSIS — E559 Vitamin D deficiency, unspecified: Secondary | ICD-10-CM | POA: Diagnosis not present

## 2020-08-22 DIAGNOSIS — Z23 Encounter for immunization: Secondary | ICD-10-CM

## 2020-08-22 DIAGNOSIS — Z951 Presence of aortocoronary bypass graft: Secondary | ICD-10-CM | POA: Diagnosis not present

## 2020-08-22 MED ORDER — LOSARTAN POTASSIUM 50 MG PO TABS: 50.0000 mg | ORAL_TABLET | Freq: Every day | ORAL | 3 refills | Status: DC

## 2020-08-22 MED ORDER — POLYETHYLENE GLYCOL 3350 17 GM/SCOOP PO POWD: 8.5000 g | Freq: Every day | ORAL | 1 refills | Status: DC | PRN

## 2020-08-22 NOTE — Assessment & Plan Note (Signed)
Chronic, LDL at goal, trig elevated. Continue statin daily.  The ASCVD Risk score Aaron Bussing DC Jr., et al., 2013) failed to calculate for the following reasons:   The 2013 ASCVD risk score is only valid for ages 9 to 29

## 2020-08-22 NOTE — Assessment & Plan Note (Signed)
Levels low off daily replacement - rec restart this.

## 2020-08-22 NOTE — Assessment & Plan Note (Signed)
rec start daily flonase with intermittent nasal saline PRN

## 2020-08-22 NOTE — Assessment & Plan Note (Signed)
-   Continue finasteride 

## 2020-08-22 NOTE — Assessment & Plan Note (Addendum)
Chronic, adequate.  Will stop amlodipine and start losartan 50mg  daily.  Update with effect.  He continues hctz and lasix daily.

## 2020-08-22 NOTE — Patient Instructions (Addendum)
Pneumovax23 today (updated pneumonia shot).  If interested, check with pharmacy about new 2 shot shingles series (shingrix).  Stop amlodipine, start losartan 50mg  in it splace. Consider holding furosemide (lasix) every once in a while to see if tolerated.  Send me date of COVID shots to update your chart.  For constipation - try miralax 1/2-1 capful daily or metamucil daily. If this doesn't help, let me know to consider daily constipation medicine. Start vitamin D 1000 units daily.  Return as needed or in 6 months for diabetes follow up visit.  Good to see you today   Health Maintenance After Age 84 After age 57, you are at a higher risk for certain long-term diseases and infections as well as injuries from falls. Falls are a major cause of broken bones and head injuries in people who are older than age 35. Getting regular preventive care can help to keep you healthy and well. Preventive care includes getting regular testing and making lifestyle changes as recommended by your health care provider. Talk with your health care provider about: Which screenings and tests you should have. A screening is a test that checks for a disease when you have no symptoms. A diet and exercise plan that is right for you. What should I know about screenings and tests to prevent falls? Screening and testing are the best ways to find a health problem early. Early diagnosis and treatment give you the best chance of managing medical conditions that are common after age 60. Certain conditions and lifestyle choices may make you more likely to have a fall. Your health care provider may recommend: Regular vision checks. Poor vision and conditions such as cataracts can make you more likely to have a fall. If you wear glasses, make sure to get your prescription updated if your vision changes. Medicine review. Work with your health care provider to regularly review all of the medicines you are taking, including over-the-counter  medicines. Ask your health care provider about any side effects that may make you more likely to have a fall. Tell your health care provider if any medicines that you take make you feel dizzy or sleepy. Osteoporosis screening. Osteoporosis is a condition that causes the bones to get weaker. This can make the bones weak and cause them to break more easily. Blood pressure screening. Blood pressure changes and medicines to control blood pressure can make you feel dizzy. Strength and balance checks. Your health care provider may recommend certain tests to check your strength and balance while standing, walking, or changing positions. Foot health exam. Foot pain and numbness, as well as not wearing proper footwear, can make you more likely to have a fall. Depression screening. You may be more likely to have a fall if you have a fear of falling, feel emotionally low, or feel unable to do activities that you used to do. Alcohol use screening. Using too much alcohol can affect your balance and may make you more likely to have a fall. What actions can I take to lower my risk of falls? General instructions Talk with your health care provider about your risks for falling. Tell your health care provider if: You fall. Be sure to tell your health care provider about all falls, even ones that seem minor. You feel dizzy, sleepy, or off-balance. Take over-the-counter and prescription medicines only as told by your health care provider. These include any supplements. Eat a healthy diet and maintain a healthy weight. A healthy diet includes low-fat dairy products,  low-fat (lean) meats, and fiber from whole grains, beans, and lots of fruits and vegetables. Home safety Remove any tripping hazards, such as rugs, cords, and clutter. Install safety equipment such as grab bars in bathrooms and safety rails on stairs. Keep rooms and walkways well-lit. Activity  Follow a regular exercise program to stay fit. This will help  you maintain your balance. Ask your health care provider what types of exercise are appropriate for you. If you need a cane or walker, use it as recommended by your health care provider. Wear supportive shoes that have nonskid soles.  Lifestyle Do not drink alcohol if your health care provider tells you not to drink. If you drink alcohol, limit how much you have: 0-1 drink a day for women. 0-2 drinks a day for men. Be aware of how much alcohol is in your drink. In the [place] one drink equals one typical bottle of beer (12 oz), one-half glass of wine (5 oz), or one shot of hard liquor (1 oz). Do not use any products that contain nicotine or tobacco, such as cigarettes and e-cigarettes. If you need help quitting, ask your health care provider. Summary Having a healthy lifestyle and getting preventive care can help to protect your health and wellness after age 34. Screening and testing are the best way to find a health problem early and help you avoid having a fall. Early diagnosis and treatment give you the best chance for managing medical conditions that are more common for people who are older than age 63. Falls are a major cause of broken bones and head injuries in people who are older than age 76. Take precautions to prevent a fall at home. Work with your health care provider to learn what changes you can make to improve your health and wellness and to prevent falls. This information is not intended to replace advice given to you by your health care provider. Make sure you discuss any questions you have with your healthcare provider. Document Revised: 01/21/2020 Document Reviewed: 01/21/2020 Elsevier Patient Education  2022 [employer].

## 2020-08-22 NOTE — Assessment & Plan Note (Signed)
Chronic constipation, ?IBS related.   Discussed miralax and metamucil use. Update if ongoing despite this to consider daily medication.

## 2020-08-22 NOTE — Assessment & Plan Note (Signed)
Likely due to diabetes.  Consider checking B1.  Continue cymbalta, alpha lipoic acid.

## 2020-08-22 NOTE — Assessment & Plan Note (Signed)
Continue aspirin, statin.  

## 2020-08-22 NOTE — Assessment & Plan Note (Signed)
Chronic, stable period on metformin 500mg  bid - continue. Does not check sugars.

## 2020-08-22 NOTE — Assessment & Plan Note (Signed)
Congratulated on weight loss to date. Stable period.

## 2020-08-22 NOTE — Progress Notes (Signed)
Patient ID: Aaron Stafford, male    DOB: [birth year], 86 y.o.   MRN: [id number]  This visit was conducted in person.  BP 140/66   Pulse 63   Temp (!) 97.5 F (36.4 C) (Temporal)   Ht 5' 6.25" (1.683 m)   Wt 175 lb (79.4 kg)   SpO2 97%   BMI 28.03 kg/m    CC: AMW f/u visit  Subjective:   HPI: Aaron Stafford is a 86 y.o. male presenting on 08/22/2020 for Annual Exam (Prt 2.) and Sinus Problem (C/o head congestion and sinus pressure.)   Saw health advisor last week for medicare wellness visit. Note reviewed.   No results found.  Flowsheet Row Clinical Support from 08/15/2020 in [facility]  PHQ-2 Total Score 0       Fall Risk  08/15/2020 02/23/2020 08/13/2019 08/07/2018 03/04/2017  Falls in the past year? 1 0 0 0 Yes  Comment - - - - fell off a chair and hit back of head; no medical treatment  Number falls in past yr: 0 0 0 - 1  Injury with Fall? 1 0 0 - Yes  Comment hit his head - - - -  Risk for fall due to : Medication side effect - Medication side effect - -  Follow up Falls evaluation completed;Falls prevention discussed Falls evaluation completed Falls evaluation completed;Falls prevention discussed - -  Fall during winter 2021, hit head on driveway. Since then having residual R sided facial and head pain/pressure. Has seen ENT - possible TMJ.   Has been off losartan for 1+ year. BP running 127-140/60s at home. Continues amlodipine, lasix and hctz.   Notes ongoing sinus congestion. Not regular with nasal saline or flonase use.   Preventative: COLONOSCOPY Date: 03/2005 diverticulosis, tortuous colon with looping [name]). Cologuard negative 2017. Denies bowel changes or blood in stool. Age out.  Prostate - h/o BPH, takes finasteride. Nocturia x1, no urinary trouble. No h/o prostate cancer. Stopped DRE, intermittent PSA.  Lung cancer screening - not eligible  Flu shot yearly  COVID vaccine - [place] 02/2019 x2  Pneumovax 2006. prevnar 07/2013  Td 2011   zostavax 2012  shingrix - discussed Advanced planning - scanned into chart 06/2014. HCPOA is wife [name] then granddaughter [name]. Ok with tube feeds. Does not want prolonged life support.  Seat belt use discussed. Sunscreen use discussed. H/o melanoma removed from scalp. Sees derm regularly (Dr Phillip Heal).  Non smoker Alcohol - none Dentist - hasn't seen recently - no dental insurance. Flosses and brushes teeth regularly.  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 Interim medical history since our last visit reviewed. Allergies and medications reviewed and updated. Outpatient Medications Prior to Visit  Medication Sig Dispense Refill   Alpha-Lipoic Acid 600 MG CAPS Take 1 capsule (600 mg total) by mouth 2 (two) times daily. (Patient taking differently: Take 600 mg by mouth daily.)     aspirin 81 MG tablet Take 81 mg by mouth daily.     Blood Glucose Monitoring Suppl ([place]) w/Device KIT Check blood sugar once daily  1 kit 0   Cholecalciferol (VITAMIN D3) 1000 UNITS CAPS Take 1 capsule (1,000 Units total) by mouth daily. 30 capsule    Cranberry 500 MG CAPS Take 2 capsules (1,000 mg total) by mouth daily.     diclofenac Sodium (VOLTAREN) 1 % GEL Apply 2 g topically 3 (three) times daily.     DULoxetine (CYMBALTA) 30 MG capsule TAKE 1 CAPSULE BY MOUTH EVERY DAY 90 capsule 2   finasteride (PROSCAR) 5 MG tablet TAKE 1 TABLET BY MOUTH EVERY DAY 90 tablet 0   fluticasone (FLONASE) 50 MCG/ACT nasal spray SPRAY 2 SPRAYS INTO EACH NOSTRIL EVERY DAY 48 mL 0   furosemide (LASIX) 20 MG tablet TAKE  1 TABLET BY MOUTH EVERY DAY 90 tablet 3   gabapentin (NEURONTIN) 300 MG capsule Take 2-3 capsules (600-900 mg total) by mouth in the morning and at bedtime. 480 capsule 1   glucose blood (ONETOUCH VERIO) test strip Check blood sugar once daily 100 strip 3   hydrochlorothiazide (HYDRODIURIL) 25 MG tablet TAKE 1 TABLET BY MOUTH EVERY DAY 90 tablet 0   metFORMIN (GLUCOPHAGE) 500 MG tablet TAKE 1 TABLET BY MOUTH 2 TIMES DAILY WITH A MEAL. 180 tablet 0   OneTouch Delica Lancets 45Y MISC Check blood sugar once daily 100 each 3   pravastatin (PRAVACHOL) 40 MG tablet TAKE 1 TABLET BY MOUTH EVERYDAY AT BEDTIME 90 tablet 0   traZODone (DESYREL) 50 MG tablet TAKE 1 TO 1 & 1/2 TABLETS BY MOUTH AT BEDTIME AS NEEDED FOR SLEEP 135 tablet 0   valACYclovir (VALTREX) 1000 MG tablet TAKE 2 TABLETS (2,000 MG TOTAL) BY MOUTH 2 (TWO) TIMES DAILY. FOR 1 DAY 4 tablet 3   vitamin B-12 (CYANOCOBALAMIN) 250 MCG tablet Take 250 mcg by mouth once a week.      amLODipine (NORVASC) 5 MG tablet TAKE 1 TABLET BY MOUTH EVERY DAY 90 tablet 0   No facility-administered medications prior to visit.     Per HPI unless specifically indicated in ROS section below Review of Systems  Objective:  BP 140/66   Pulse 63   Temp (!) 97.5 F (36.4 C) (Temporal)   Ht 5' 6.25" (1.683 m)   Wt 175 lb (79.4 kg)   SpO2 97%   BMI 28.03 kg/m   Wt Readings from Last 3 Encounters:  08/22/20 175 lb (79.4 kg)  05/10/20 171 lb (77.6 kg)  02/23/20 174 lb 3.2 oz (79 kg)      Physical Exam Vitals and nursing note reviewed.  Constitutional:      General: He is not in acute distress.    Appearance: Normal appearance. He is well-developed. He is not ill-appearing.  HENT:     Head: Normocephalic and atraumatic.     Right Ear: Hearing, tympanic membrane, ear canal and external ear normal.     Left Ear: Hearing, tympanic membrane, ear canal and external ear normal.     Nose: Mucosal edema and congestion present.     Right Turbinates: Not  enlarged, swollen or pale.     Left Turbinates: Not enlarged, swollen or pale.     Right Sinus: No maxillary sinus tenderness or frontal sinus tenderness.     Left Sinus: No maxillary sinus tenderness or frontal sinus tenderness.  Eyes:     General: No scleral icterus.    Extraocular Movements: Extraocular movements intact.     Conjunctiva/sclera: Conjunctivae normal.     Pupils: Pupils are equal, round, and reactive to light.  Neck:  Thyroid: No thyroid mass or thyromegaly.  Cardiovascular:     Rate and Rhythm: Normal rate and regular rhythm.     Pulses: Normal pulses.          Radial pulses are 2+ on the right side and 2+ on the left side.     Heart sounds: Normal heart sounds. No murmur heard. Pulmonary:     Effort: Pulmonary effort is normal. No respiratory distress.     Breath sounds: Normal breath sounds. No wheezing, rhonchi or rales.  Abdominal:     General: Bowel sounds are normal. There is no distension.     Palpations: Abdomen is soft. There is no mass.     Tenderness: There is no abdominal tenderness. There is no guarding or rebound.     Hernia: No hernia is present.  Musculoskeletal:        General: Normal range of motion.     Cervical back: Normal range of motion and neck supple.     Right lower leg: No edema.     Left lower leg: No edema.  Lymphadenopathy:     Cervical: No cervical adenopathy.  Skin:    General: Skin is warm and dry.     Findings: No rash.  Neurological:     General: No focal deficit present.     Mental Status: He is alert and oriented to person, place, and time.  Psychiatric:        Mood and Affect: Mood normal.        Behavior: Behavior normal.        Thought Content: Thought content normal.        Judgment: Judgment normal.      Results for orders placed or performed in visit on 08/16/20  Microalbumin / creatinine urine ratio  Result Value Ref Range   Microalb, Ur 4.0 (H) 0.0 - 1.9 mg/dL   Creatinine,U 174.0 mg/dL   Microalb Creat  Ratio 2.3 0.0 - 30.0 mg/g  Hemoglobin A1c  Result Value Ref Range   Hgb A1c MFr Bld 7.2 (H) 4.6 - 6.5 %  Lipid panel  Result Value Ref Range   Cholesterol 140 0 - 200 mg/dL   Triglycerides 268.0 (H) 0.0 - 149.0 mg/dL   HDL 37.00 (L) >39.00 mg/dL   VLDL 53.6 (H) 0.0 - 40.0 mg/dL   Total CHOL/HDL Ratio 4    NonHDL 102.76   Comprehensive metabolic panel  Result Value Ref Range   Sodium 138 135 - 145 mEq/L   Potassium 4.1 3.5 - 5.1 mEq/L   Chloride 98 96 - 112 mEq/L   CO2 31 19 - 32 mEq/L   Glucose, Bld 130 (H) 70 - 99 mg/dL   BUN 18 6 - 23 mg/dL   Creatinine, Ser 0.98 0.40 - 1.50 mg/dL   Total Bilirubin 0.7 0.2 - 1.2 mg/dL   Alkaline Phosphatase 55 39 - 117 U/L   AST 18 0 - 37 U/L   ALT 18 0 - 53 U/L   Total Protein 7.0 6.0 - 8.3 g/dL   Albumin 4.1 3.5 - 5.2 g/dL   GFR 70.82 >60.00 mL/min   Calcium 10.0 8.4 - 10.5 mg/dL  VITAMIN D 25 Hydroxy (Vit-D Deficiency, Fractures)  Result Value Ref Range   VITD 29.11 (L) 30.00 - 100.00 ng/mL  LDL cholesterol, direct  Result Value Ref Range   Direct LDL 70.0 mg/dL    Assessment & Plan:  This visit occurred during the SARS-CoV-2 public health emergency.  Safety  protocols were in place, including screening questions prior to the visit, additional usage of staff PPE, and extensive cleaning of exam room while observing appropriate contact time as indicated for disinfecting solutions.   Problem List Items Addressed This Visit     Vitamin D deficiency    Levels low off daily replacement - rec restart this.        Overweight with body mass index (BMI) 25.0-29.9    Congratulated on weight loss to date. Stable period.        Coronary atherosclerosis    Continue aspirin, statin.        Relevant Medications   losartan (COZAAR) 50 MG tablet   HTN (hypertension)    Chronic, adequate.  Will stop amlodipine and start losartan 70m daily.  Update with effect.  He continues hctz and lasix daily.        Relevant Medications    losartan (COZAAR) 50 MG tablet   Hyperlipidemia associated with type 2 diabetes mellitus (HCC)    Chronic, LDL at goal, trig elevated. Continue statin daily.  The ASCVD Risk score (Mikey BussingDC Jr., et al., 2013) failed to calculate for the following reasons:   The 2013 ASCVD risk score is only valid for ages 459to 736       Relevant Medications   losartan (COZAAR) 50 MG tablet   Type 2 diabetes, controlled, with peripheral neuropathy (HCC) - Primary    Chronic, stable period on metformin 5041mbid - continue. Does not check sugars.        Relevant Medications   losartan (COZAAR) 50 MG tablet   Chronic rhinitis    rec start daily flonase with intermittent nasal saline PRN       S/P CABG (coronary artery bypass graft)   BPH with obstruction/lower urinary tract symptoms    Continue finasteride.       Peripheral neuropathy    Likely due to diabetes.  Consider checking B1.  Continue cymbalta, alpha lipoic acid.        Chronic constipation    Chronic constipation, ?IBS related.   Discussed miralax and metamucil use. Update if ongoing despite this to consider daily medication.        Relevant Medications   polyethylene glycol powder (GLYCOLAX/MIRALAX) 17 GM/SCOOP powder   Other Visit Diagnoses     Need for 23-polyvalent pneumococcal polysaccharide vaccine       Relevant Orders   Pneumococcal polysaccharide vaccine 23-valent greater than or equal to 2yo subcutaneous/IM (Completed)        Meds ordered this encounter  Medications   polyethylene glycol powder (GLYCOLAX/MIRALAX) 17 GM/SCOOP powder    Sig: Take 8.5-17 g by mouth daily as needed for moderate constipation.    Dispense:  3350 g    Refill:  1   losartan (COZAAR) 50 MG tablet    Sig: Take 1 tablet (50 mg total) by mouth daily.    Dispense:  90 tablet    Refill:  3    In place of amlodipine    Orders Placed This Encounter  Procedures   Pneumococcal polysaccharide vaccine 23-valent greater than or equal to  2yo subcutaneous/IM     Patient instructions: Pneumovax23 today (updated pneumonia shot).  If interested, check with pharmacy about new 2 shot shingles series (shingrix).  Stop amlodipine, start losartan 5054mn it splace. Consider holding furosemide (lasix) every once in a while to see if tolerated.  Send me date of COVID shots to update your chart.  For constipation - try miralax 1/2-1 capful daily or metamucil daily. If this doesn't help, let me know to consider daily constipation medicine. Start vitamin D 1000 units daily.  Return as needed or in 6 months for diabetes follow up visit.  Good to see you today   Follow up plan: Return in about 6 months (around 02/22/2021) for follow up visit.  Ria Bush, MD

## 2020-08-24 ENCOUNTER — Telehealth: Payer: Medicare Other

## 2020-09-06 ENCOUNTER — Telehealth: Payer: Self-pay | Admitting: *Deleted

## 2020-09-06 NOTE — Telephone Encounter (Signed)
Spoke to patient by telephone and was advised that his blood pressure is 136/67 and heart rate 61. Patient scheduled for a visit with Dr. Einar Pheasant tomorrow 09/07/20 at 9:00 am. Patient was given ER precautions and he verbalized understanding. Patient had a negative covid screening.

## 2020-09-06 NOTE — Telephone Encounter (Signed)
Patient called stating that he was in a couple of weeks ago and had a sinus infection. Patient stated that he has been using the nasal saline and nasal spray as recommended. Patient stated that he thinks the sinus infection may have gotten worse. Patient stated the right side of his nostril may feel a little stuffy. Patient stated that he has been having head pressure in the back of his head that goes to the top of his head. Patient stated last night the pain level in his head was about a 4-6. Patient stated that he fell and hit his head back in February or March. Patient stated since the fall his thinking has changed. Patient stated that he also had surgery behind his ear back in March and May.  Patient stated that the pain started about a month ago and has gotten worse in the past week. Patient denies chest pain, SOB or any visual changes. Patient stated that he has not checked his blood pressure recently. Patient stated that he has soreness in the back of his neck. Patient stated that he is on his way home and will check his blood pressure when he gets home. Advised patient that I will give him time to get home and will call him back to get his blood pressure reading.

## 2020-09-07 ENCOUNTER — Other Ambulatory Visit: Payer: Self-pay

## 2020-09-07 ENCOUNTER — Ambulatory Visit (INDEPENDENT_AMBULATORY_CARE_PROVIDER_SITE_OTHER): Payer: Medicare Other | Admitting: Family Medicine

## 2020-09-07 VITALS — BP 104/54 | HR 75 | Temp 98.1°F | Wt 173.5 lb

## 2020-09-07 DIAGNOSIS — R519 Headache, unspecified: Secondary | ICD-10-CM | POA: Diagnosis not present

## 2020-09-07 DIAGNOSIS — J320 Chronic maxillary sinusitis: Secondary | ICD-10-CM

## 2020-09-07 MED ORDER — AMOXICILLIN-POT CLAVULANATE 875-125 MG PO TABS: 1.0000 | ORAL_TABLET | Freq: Two times a day (BID) | ORAL | 0 refills | Status: AC

## 2020-09-07 NOTE — Telephone Encounter (Signed)
Noted! Thank you

## 2020-09-07 NOTE — Assessment & Plan Note (Signed)
Reassuring neuro exam and pt notes HA the same time as sinus pressure though location is more occiptal vs sinus location. Given reassuring exam seems reasonable to do trial of abx. Though did discuss if worsening would recommend f/u and MRI Brain to rule out structural causes.

## 2020-09-07 NOTE — Progress Notes (Signed)
Subjective:     Aaron Stafford is a 86 y.o. male presenting for Headache (Lots of pressure and heaviness in R side and back of head. Tilting head down causes increase in pressure and dizziness)     Headache  This is a new problem. The current episode started more than 1 month ago. The pain is located in the Occipital and right unilateral region. Radiates to: to top of head. The pain quality is not similar to prior headaches. The quality of the pain is described as shooting (pressure). Associated symptoms include dizziness (with bending), drainage, eye pain (pressure behind the eye), neck pain and sinus pressure. Pertinent negatives include no ear pain, fever, loss of balance, nausea, numbness, phonophobia, photophobia, tingling, tinnitus, visual change or vomiting. Exacerbated by: heaviness and dizziness with bending over. Treatments tried: flonase, nasal spray. The treatment provided mild relief. His past medical history is significant for recent head traumas (4-5 months ago).   Went to an ENT w/in the last 2 months - had a camera through nares Dr. Darnell Level - advised flonase and saline w/o improvement  Recently had a sebaceous cyst removal behind the right ear - benign tumor  Has not been on antibiotics for his sinuses   Review of Systems  Constitutional:  Negative for fever.  HENT:  Positive for sinus pressure. Negative for ear pain and tinnitus.   Eyes:  Positive for pain (pressure behind the eye). Negative for photophobia.  Gastrointestinal:  Negative for nausea and vomiting.  Musculoskeletal:  Positive for neck pain.  Neurological:  Positive for dizziness (with bending) and headaches. Negative for tingling, numbness and loss of balance.    Social History   Tobacco Use  Smoking Status Never  Smokeless Tobacco Never        Objective:    BP Readings from Last 3 Encounters:  09/07/20 (!) 104/54  08/22/20 140/66  05/10/20 (!) 147/66   Wt Readings from Last 3 Encounters:   09/07/20 173 lb 8 oz (78.7 kg)  08/22/20 175 lb (79.4 kg)  05/10/20 171 lb (77.6 kg)    BP (!) 104/54   Pulse 75   Temp 98.1 F (36.7 C) (Temporal)   Wt 173 lb 8 oz (78.7 kg)   SpO2 97%   BMI 27.79 kg/m    Physical Exam Constitutional:      Appearance: Normal appearance. He is not ill-appearing or diaphoretic.  HENT:     Head: Normocephalic and atraumatic.     Right Ear: Tympanic membrane and external ear normal.     Left Ear: Tympanic membrane and external ear normal.     Nose: Congestion present.     Mouth/Throat:     Pharynx: Posterior oropharyngeal erythema present.  Eyes:     General: No scleral icterus.    Extraocular Movements: Extraocular movements intact.     Conjunctiva/sclera: Conjunctivae normal.  Cardiovascular:     Rate and Rhythm: Normal rate and regular rhythm.     Heart sounds: Murmur heard.  Pulmonary:     Effort: Pulmonary effort is normal. No respiratory distress.     Breath sounds: Normal breath sounds. No wheezing.  Musculoskeletal:     Cervical back: Normal range of motion and neck supple. No rigidity. No spinous process tenderness or muscular tenderness.  Skin:    General: Skin is warm and dry.  Neurological:     Mental Status: He is alert. Mental status is at baseline.     Cranial Nerves: No cranial nerve  deficit or facial asymmetry.     Sensory: No sensory deficit.     Motor: No weakness.     Coordination: Coordination normal.     Gait: Gait normal.  Psychiatric:        Mood and Affect: Mood normal.          Assessment & Plan:   Problem List Items Addressed This Visit       Other   New onset headache    Reassuring neuro exam and pt notes HA the same time as sinus pressure though location is more occiptal vs sinus location. Given reassuring exam seems reasonable to do trial of abx. Though did discuss if worsening would recommend f/u and MRI Brain to rule out structural causes.        Other Visit Diagnoses     Right  maxillary sinusitis    -  Primary   Relevant Medications   amoxicillin-clavulanate (AUGMENTIN) 875-125 MG tablet      Sinus symptoms x weeks w/o improvement with saline. Trial of abx. Return if symptoms do not resolve.    Return if symptoms worsen or fail to improve.  Lesleigh Noe, MD  This visit occurred during the SARS-CoV-2 public health emergency.  Safety protocols were in place, including screening questions prior to the visit, additional usage of staff PPE, and extensive cleaning of exam room while observing appropriate contact time as indicated for disinfecting solutions.

## 2020-09-07 NOTE — Patient Instructions (Signed)
#  Take the antibiotic for 7 days - if no improvement - call or make appointment with Dr. Danise Mina - if headache worsens we may want to consider head imaging.

## 2020-09-07 NOTE — Telephone Encounter (Signed)
See encounter from today.

## 2020-09-27 DIAGNOSIS — L570 Actinic keratosis: Secondary | ICD-10-CM | POA: Diagnosis not present

## 2020-09-27 DIAGNOSIS — L249 Irritant contact dermatitis, unspecified cause: Secondary | ICD-10-CM | POA: Diagnosis not present

## 2020-10-04 ENCOUNTER — Other Ambulatory Visit: Payer: Self-pay | Admitting: Family Medicine

## 2020-10-16 ENCOUNTER — Other Ambulatory Visit: Payer: Self-pay | Admitting: Family Medicine

## 2020-10-17 NOTE — Telephone Encounter (Signed)
Gabapentin Last filled:  10/04/20, #360 Last OV:  08/22/20, AWV prt 2 Next OV:  02/23/21, 6 mo DM f/u  Pharmacy requests Miralax 510 g vs 3350 g

## 2020-10-18 NOTE — Telephone Encounter (Signed)
ERx 

## 2021-01-01 DIAGNOSIS — Z23 Encounter for immunization: Secondary | ICD-10-CM | POA: Diagnosis not present

## 2021-02-01 DIAGNOSIS — M47812 Spondylosis without myelopathy or radiculopathy, cervical region: Secondary | ICD-10-CM | POA: Diagnosis not present

## 2021-02-05 ENCOUNTER — Telehealth: Payer: Self-pay

## 2021-02-05 DIAGNOSIS — E1142 Type 2 diabetes mellitus with diabetic polyneuropathy: Secondary | ICD-10-CM

## 2021-02-05 MED ORDER — ONETOUCH VERIO VI STRP: ORAL_STRIP | 3 refills | Status: DC

## 2021-02-05 MED ORDER — ONETOUCH VERIO FLEX SYSTEM W/DEVICE KIT: PACK | 0 refills | Status: DC

## 2021-02-05 MED ORDER — ONETOUCH DELICA LANCETS 30G MISC: 3 refills | Status: DC

## 2021-02-05 NOTE — Progress Notes (Addendum)
Chronic Care Management Pharmacy Assistant   Name: Aaron Stafford  MRN: [id number] DOB: [birth year]  Reason for Encounter: CCM (Diabetes Disease State)   Recent office visits:  08/22/2020 - Ria Bush, MD - Patient presented for annual exam. Stop: amLODipine (NORVASC) 5 MG tablet Start: losartan (COZAAR) 50 MG tablet. Start: polyethylene glycol powder (GLYCOLAX/MIRALAX) 17 GM/SCOOP powder. Immunizations: Pneumococcal Polysaccharide - 23.  08/15/2020 - Viviana Simpler, MD - Patient presented for [facility]. No medication changes.    Recent consult visits:  09/07/2020 - Family Medicine - Patient presented for right maxillary sinusitis. Start: amoxicillin-clavulanate (AUGMENTIN) 875-125 MG tablet for 7 days.   Hospital visits:  None in previous 6 months  Medications: Outpatient Encounter Medications as of 02/05/2021  Medication Sig   Alpha-Lipoic Acid 600 MG CAPS Take 1 capsule (600 mg total) by mouth 2 (two) times daily. (Patient taking differently: Take 600 mg by mouth daily.)   aspirin 81 MG tablet Take 81 mg by mouth daily.   Blood Glucose Monitoring Suppl (ONETOUCH VERIO FLEX SYSTEM) w/Device KIT Check blood sugar once daily   Cholecalciferol (VITAMIN D3) 1000 UNITS CAPS Take 1 capsule (1,000 Units total) by mouth daily.   Cranberry 500 MG CAPS Take 2 capsules (1,000 mg total) by mouth daily.   diclofenac Sodium (VOLTAREN) 1 % GEL Apply 2 g topically 3 (three) times daily.   DULoxetine (CYMBALTA) 30 MG capsule TAKE 1 CAPSULE BY MOUTH EVERY DAY   finasteride (PROSCAR) 5 MG tablet TAKE 1 TABLET BY MOUTH EVERY DAY   fluticasone (FLONASE) 50 MCG/ACT nasal spray SPRAY 2 SPRAYS INTO EACH NOSTRIL EVERY DAY   furosemide (LASIX) 20 MG tablet TAKE 1 TABLET BY MOUTH EVERY DAY   gabapentin (NEURONTIN) 300 MG capsule TAKE 2 CAPSULES BY MOUTH TWICE A DAY   glucose blood (ONETOUCH VERIO) test strip Check blood sugar once daily   hydrochlorothiazide (HYDRODIURIL) 25 MG tablet  TAKE 1 TABLET BY MOUTH EVERY DAY   losartan (COZAAR) 50 MG tablet Take 1 tablet (50 mg total) by mouth daily.   metFORMIN (GLUCOPHAGE) 500 MG tablet TAKE 1 TABLET BY MOUTH 2 TIMES DAILY WITH A MEAL.   OneTouch Delica Lancets 77A MISC Check blood sugar once daily   polyethylene glycol powder (GLYCOLAX/MIRALAX) 17 GM/SCOOP powder TAKE 8.5-17 G BY MOUTH DAILY AS NEEDED FOR MODERATE CONSTIPATION.   pravastatin (PRAVACHOL) 40 MG tablet TAKE 1 TABLET BY MOUTH EVERYDAY AT BEDTIME   traZODone (DESYREL) 50 MG tablet TAKE 1 TO 1 & 1/2 TABLETS BY MOUTH AT BEDTIME AS NEEDED FOR SLEEP   valACYclovir (VALTREX) 1000 MG tablet TAKE 2 TABLETS (2,000 MG TOTAL) BY MOUTH 2 (TWO) TIMES DAILY. FOR 1 DAY   vitamin B-12 (CYANOCOBALAMIN) 250 MCG tablet Take 250 mcg by mouth once a week.    No facility-administered encounter medications on file as of 02/05/2021.   Recent Relevant Labs: Lab Results  Component Value Date/Time   HGBA1C 7.2 (H) 08/16/2020 08:43 AM   HGBA1C 6.3 (A) 02/23/2020 09:17 AM   HGBA1C 6.5 08/13/2019 08:14 AM   MICROALBUR 4.0 (H) 08/16/2020 08:43 AM   MICROALBUR 3.4 (H) 02/21/2014 08:43 AM    Kidney Function Lab Results  Component Value Date/Time   CREATININE 0.98 08/16/2020 08:43 AM   CREATININE 1.10 10/08/2019 12:35 PM   GFR 70.82 08/16/2020 08:43 AM   GFRNONAA >60 10/08/2019 12:35 PM   GFRAA >60 10/08/2019 12:35 PM   Contacted patient on 02/05/2021 to discuss diabetes disease state.  Current antihyperglycemic regimen:  Metformin 500 mg - Take 1 tablet by mouth 2 times daily with meal  Patient verbally confirms he is taking the above medications as directed. Yes  What diet changes have been made to improve diabetes control? Patient states he refrains from most sugars and tries to eat well.   What recent interventions/DTPs have been made to improve glycemic control:  No recent interventions  Have there been any recent hospitalizations or ED visits since last visit with CPP?  No  Patient denies hypoglycemic symptoms, including Pale, Sweaty, Shaky, Hungry, Nervous/irritable, and Vision changes  Patient denies hyperglycemic symptoms, including blurry vision, excessive thirst, fatigue, polyuria, and weakness  How often are you checking your blood sugar? Patient has not been taking his blood sugar. Patient stated he had a prescription for a blood glucose monitor that was sent to [facility] which has closed. He tried to get [facility] and they stated they did not have a prescription. Patient does not have a way to check his blood sugar and would like a new prescription for the blood glucose monitor and supplies [facility] [address], [place] Phone: [phone]  During the week, how often does your blood glucose drop below 70? Patient does not know as he does not check his blood sugar.   Are you checking your feet daily/regularly? Yes  Adherence Review: Is the patient currently on a STATIN medication? Yes Is the patient currently on ACE/ARB medication? Yes Does the patient have >5 day gap between last estimated fill dates? No  Care Gaps: Annual wellness visit in last year? Yes 08/15/2020 Most recent A1C reading: 7.2 on 08/16/2020 Most Recent BP reading: 104/54 on 09/07/2020  Last eye exam / retinopathy screening: 12/15/2019 Last diabetic foot exam: Up to date  Counseled patient on importance of annual eye and foot exam.   Star Rating Drugs:  Medication:  Last Fill: Day Supply Metformin 500 mg 02/03/2021 90 Pravastatin 40 mg 01/15/2021 90 Losartan 50 mg 11/15/2020 90 [facility]  PCP appointment on 02/24/2020  [name], CPP notified  Marijean Niemann, [place] Assistant [phone]  I have reviewed the care management and care coordination activities outlined in this encounter and I am certifying that I agree with the content of this note. No further action required.  [name],  PharmD Clinical Pharmacist [facility] [phone]

## 2021-02-05 NOTE — Addendum Note (Signed)
Addended by: [name] on: 02/05/2021 03:19 PM   Modules accepted: Orders

## 2021-02-23 ENCOUNTER — Encounter: Payer: Self-pay | Admitting: Family Medicine

## 2021-02-23 ENCOUNTER — Ambulatory Visit (INDEPENDENT_AMBULATORY_CARE_PROVIDER_SITE_OTHER)
Admission: RE | Admit: 2021-02-23 | Discharge: 2021-02-23 | Disposition: A | Discharge status: Home / Self Care | Payer: Medicare Other | Source: Ambulatory Visit | Attending: Family Medicine | Admitting: Family Medicine

## 2021-02-23 ENCOUNTER — Ambulatory Visit (INDEPENDENT_AMBULATORY_CARE_PROVIDER_SITE_OTHER): Payer: Medicare Other | Admitting: Family Medicine

## 2021-02-23 ENCOUNTER — Other Ambulatory Visit: Payer: Self-pay

## 2021-02-23 VITALS — BP 132/70 | HR 57 | Temp 96.3°F | Ht 66.0 in | Wt 184.1 lb

## 2021-02-23 DIAGNOSIS — E1140 Type 2 diabetes mellitus with diabetic neuropathy, unspecified: Secondary | ICD-10-CM | POA: Diagnosis not present

## 2021-02-23 DIAGNOSIS — E785 Hyperlipidemia, unspecified: Secondary | ICD-10-CM

## 2021-02-23 DIAGNOSIS — R079 Chest pain, unspecified: Secondary | ICD-10-CM | POA: Diagnosis not present

## 2021-02-23 DIAGNOSIS — M898X1 Other specified disorders of bone, shoulder: Secondary | ICD-10-CM | POA: Diagnosis not present

## 2021-02-23 DIAGNOSIS — R0789 Other chest pain: Secondary | ICD-10-CM

## 2021-02-23 DIAGNOSIS — E1142 Type 2 diabetes mellitus with diabetic polyneuropathy: Secondary | ICD-10-CM | POA: Diagnosis not present

## 2021-02-23 DIAGNOSIS — Z951 Presence of aortocoronary bypass graft: Secondary | ICD-10-CM | POA: Diagnosis not present

## 2021-02-23 DIAGNOSIS — I25118 Atherosclerotic heart disease of native coronary artery with other forms of angina pectoris: Secondary | ICD-10-CM

## 2021-02-23 DIAGNOSIS — E1169 Type 2 diabetes mellitus with other specified complication: Secondary | ICD-10-CM | POA: Diagnosis not present

## 2021-02-23 DIAGNOSIS — G4700 Insomnia, unspecified: Secondary | ICD-10-CM

## 2021-02-23 LAB — BASIC METABOLIC PANEL
BUN: 23 mg/dL (ref 6–23)
CO2: 33 mEq/L — ABNORMAL HIGH (ref 19–32)
Calcium: 10.2 mg/dL (ref 8.4–10.5)
Chloride: 99 mEq/L (ref 96–112)
Creatinine, Ser: 1.03 mg/dL (ref 0.40–1.50)
GFR: 66.47 mL/min (ref >60.00)
Glucose, Bld: 155 mg/dL — ABNORMAL HIGH (ref 70–99)
Potassium: 4.8 mEq/L (ref 3.5–5.1)
Sodium: 140 mEq/L (ref 135–145)

## 2021-02-23 LAB — TSH: TSH: 3.35 u[IU]/mL (ref 0.35–5.50)

## 2021-02-23 LAB — CBC WITH DIFFERENTIAL/PLATELET
Basophils Absolute: 0 10*3/uL (ref 0.0–0.1)
Basophils Relative: 0.6 % (ref 0.0–3.0)
Eosinophils Absolute: 0.2 10*3/uL (ref 0.0–0.7)
Eosinophils Relative: 2.5 % (ref 0.0–5.0)
HCT: 41.2 % (ref 39.0–52.0)
Hemoglobin: 13.7 g/dL (ref 13.0–17.0)
Lymphocytes Relative: 25.1 % (ref 12.0–46.0)
Lymphs Abs: 2 10*3/uL (ref 0.7–4.0)
MCHC: 33.3 g/dL (ref 30.0–36.0)
MCV: 90.4 fl (ref 78.0–100.0)
Monocytes Absolute: 0.7 10*3/uL (ref 0.1–1.0)
Monocytes Relative: 8.3 % (ref 3.0–12.0)
Neutro Abs: 5 10*3/uL (ref 1.4–7.7)
Neutrophils Relative %: 63.5 % (ref 43.0–77.0)
Platelets: 240 10*3/uL (ref 150.0–400.0)
RBC: 4.56 Mil/uL (ref 4.22–5.81)
RDW: 14 % (ref 11.5–15.5)
WBC: 7.8 10*3/uL (ref 4.0–10.5)

## 2021-02-23 LAB — POCT GLYCOSYLATED HEMOGLOBIN (HGB A1C): Hemoglobin A1C: 6.7 % — AB (ref 4.0–5.6)

## 2021-02-23 IMAGING — DX DG CHEST 2V
2 series · 2 of 2 positions shown · non-contrast
Comparison: [DATE]

CLINICAL DATA: R collarbone pain, chest pain

EXAM:
CHEST - 2 VIEW

[chest pa]
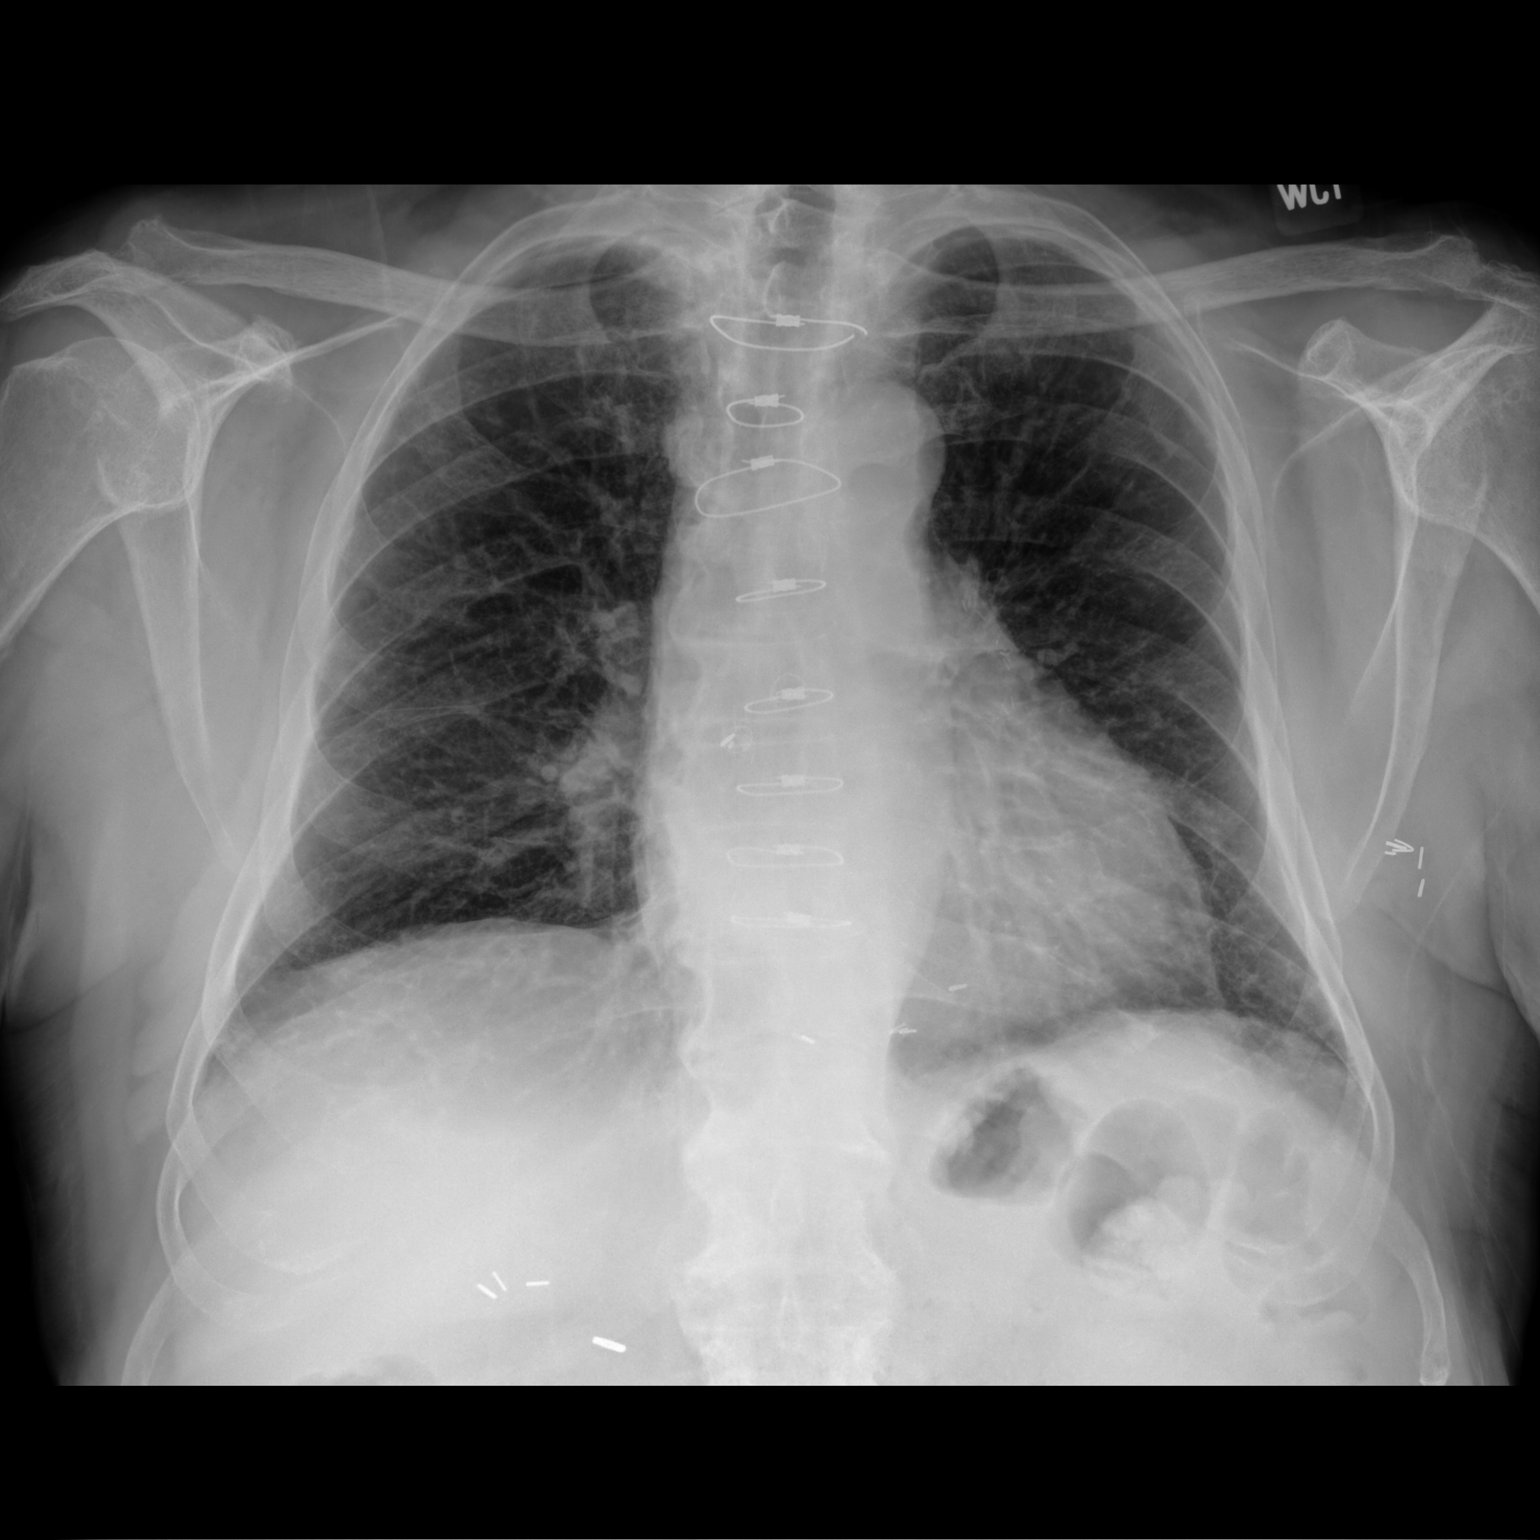

[chest lat]
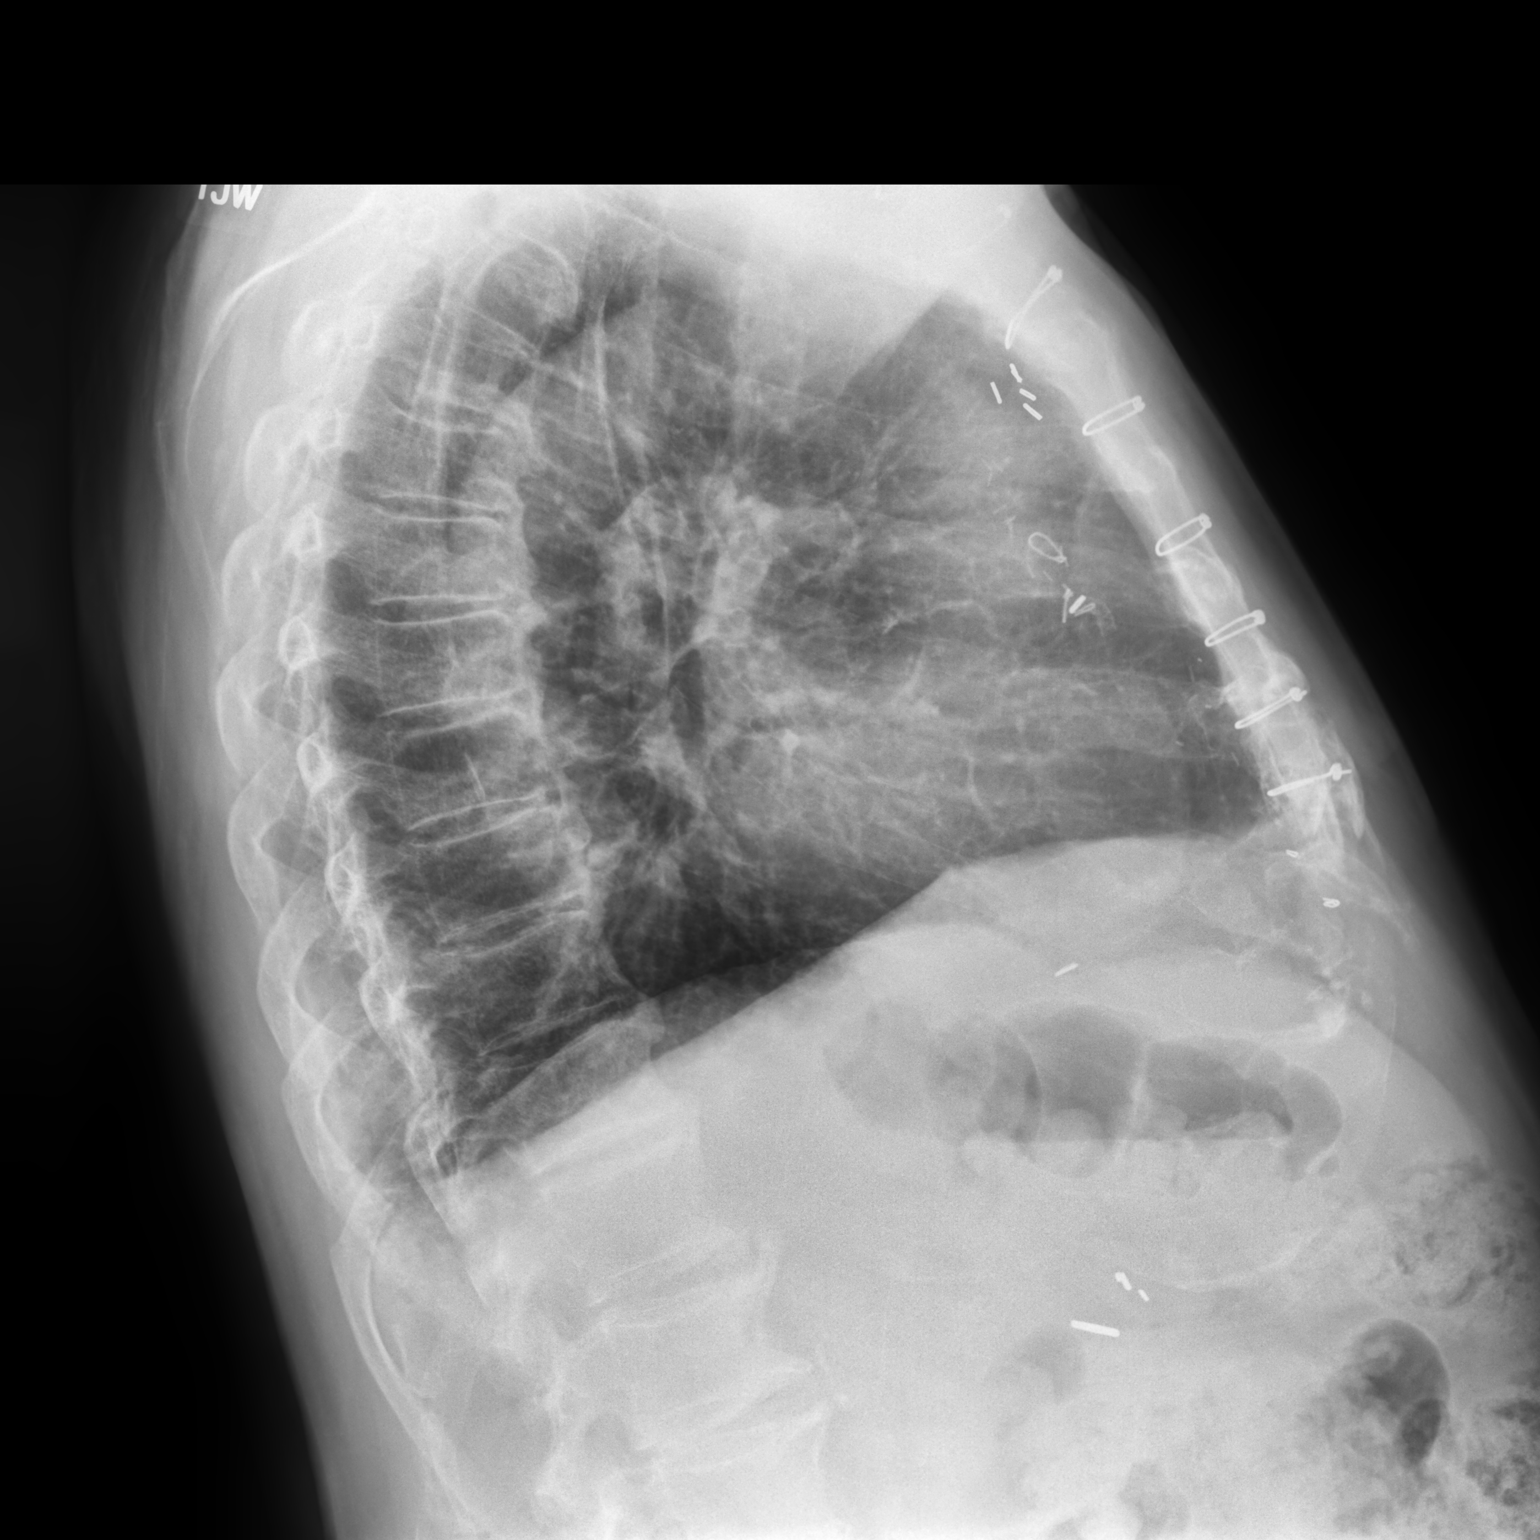

[2 of 2 positions shown; findings below may reference images not displayed]

FINDINGS: Cardiomediastinal silhouette is within normal limits. No pleural
effusion. No pneumothorax. No focal consolidation. No acute osseous
abnormality. Median sternotomy wires. Surgical clips overlie the
left axilla, mediastinum, and right upper quadrant.
IMPRESSION: No acute findings in the chest.

## 2021-02-23 MED ORDER — DULOXETINE HCL 20 MG PO CPEP: ORAL_CAPSULE | ORAL | 3 refills | Status: DC

## 2021-02-23 MED ORDER — VALACYCLOVIR HCL 1 G PO TABS: 2000.0000 mg | ORAL_TABLET | Freq: Two times a day (BID) | ORAL | 3 refills | Status: DC

## 2021-02-23 MED ORDER — TRAZODONE HCL 50 MG PO TABS: ORAL_TABLET | ORAL | 3 refills | Status: DC

## 2021-02-23 NOTE — Assessment & Plan Note (Signed)
Chronic, stable period on current regimen of metformin 500mg  bid.

## 2021-02-23 NOTE — Patient Instructions (Addendum)
We will request latest eye exam from Dr Ellin Mayhew in [place].  EKG today.  Chest xray today.  We will refer you back to cardiology.  Continue current medicines.  I have refilled cymbalta (duloxetine) at lower dose 20mg  nightly.  Good to see you today.  Return in 6 months for wellness visit

## 2021-02-23 NOTE — Progress Notes (Signed)
Patient ID: Aaron Stafford, male    DOB: January 11, 1935, 87 y.o.   MRN: [id number]  This visit was conducted in person.  BP 132/70    Pulse (!) 57    Temp (!) 96.3 F (35.7 C) (Temporal)    Ht _0  (1.676 m)    Wt 184 lb 2 oz (83.5 kg)    SpO2 96%    BMI 29.72 kg/m    CC: DM f/u visit, R collarbone pain  Subjective:   HPI: Aaron Stafford is a 87 y.o. male presenting on 02/23/2021 for Follow-up (6 mo DM /[facility]/Has not had an eye exam in last year) and Pain (Right collarbone x 3 months)   Over the past 2 weeks noticing substernal tightness whenever he lifts 5 gallon water dispenser or walks fast, also feels tightness to R jaw. Takes 2 aspirin, sitting and resting also helps. Last saw Dr Rockey Situ 04/2019. Known CAD s/p CABG 2011. Does not have nitro at home.   3 mo h/o R medial collarbone soreness without inciting trauma/injury or falls.  No bruising.   DM - does not regularly check sugars had trouble obtaining glucometer recently - but finally has obtained one. Normally checks sugars once daily. Compliant with antihyperglycemic regimen which includes: metformin 568m bid. Denies low sugars or hypoglycemic symptoms. Denies paresthesias, blurry vision. Last diabetic eye exam 11/2019, DUE - however he states he's recently [place]- [place] were requested. Glucometer brand: onetouch verio. Last foot exam: 02/2020 - due. DSME: declines. Lab Results  Component Value Date   HGBA1C 6.7 (A) 02/23/2021   Diabetic Foot Exam - Simple   Simple Foot Form Diabetic Foot exam was performed with the following findings: Yes 02/23/2021 10:07 AM  Visual Inspection No deformities, no ulcerations, no other skin breakdown bilaterally: Yes Sensation Testing Intact to touch and monofilament testing bilaterally: Yes Pulse Check See comments: Yes Comments Mildly diminished pulses bilaterally    Lab Results  Component Value Date   MICROALBUR 4.0 (H) 08/16/2020         Relevant past medical, surgical, family and social history reviewed and updated as indicated. Interim medical history since our last visit reviewed. Allergies and medications reviewed and updated. Outpatient Medications Prior to Visit  Medication Sig Dispense Refill   Alpha-Lipoic Acid 600 MG CAPS Take 1 capsule (600 mg total) by mouth 2 (two) times daily. (Patient taking differently: Take 600 mg by mouth daily.)     aspirin 81 MG tablet Take 81 mg by mouth daily.     Blood Glucose Monitoring Suppl (ONETOUCH VERIO FLEX SYSTEM) w/Device KIT Check blood sugar once daily 1 kit 0   Cholecalciferol (VITAMIN D3) 1000 UNITS CAPS Take 1 capsule (1,000 Units total) by mouth daily. 30 capsule    Cranberry 500 MG CAPS Take 2 capsules (1,000 mg total) by mouth daily.     diclofenac Sodium (VOLTAREN) 1 % GEL Apply 2 g topically 3 (three) times daily.     finasteride (PROSCAR) 5 MG tablet TAKE 1 TABLET BY MOUTH EVERY DAY 90 tablet 2   fluticasone (FLONASE) 50 MCG/ACT nasal spray SPRAY 2 SPRAYS INTO EACH NOSTRIL EVERY DAY 48 mL 3   furosemide (LASIX) 20 MG tablet TAKE 1 TABLET BY MOUTH EVERY DAY 90 tablet 3   gabapentin (NEURONTIN) 300 MG capsule TAKE 2 CAPSULES BY MOUTH TWICE A DAY 360 capsule 1   glucose blood (ONETOUCH VERIO) test strip Check blood sugar once  daily 100 strip 3   hydrochlorothiazide (HYDRODIURIL) 25 MG tablet TAKE 1 TABLET BY MOUTH EVERY DAY 90 tablet 3   losartan (COZAAR) 50 MG tablet Take 1 tablet (50 mg total) by mouth daily. 90 tablet 3   metFORMIN (GLUCOPHAGE) 500 MG tablet TAKE 1 TABLET BY MOUTH 2 TIMES DAILY WITH A MEAL. 180 tablet 3   OneTouch Delica Lancets 74F MISC Check blood sugar once daily 100 each 3   polyethylene glycol powder (GLYCOLAX/MIRALAX) 17 GM/SCOOP powder TAKE 8.5-17 G BY MOUTH DAILY AS NEEDED FOR MODERATE CONSTIPATION. 510 g 1   pravastatin (PRAVACHOL) 40 MG tablet TAKE 1 TABLET BY MOUTH EVERYDAY AT BEDTIME 90 tablet 3   vitamin B-12 (CYANOCOBALAMIN) 250 MCG  tablet Take 250 mcg by mouth once a week.      DULoxetine (CYMBALTA) 30 MG capsule TAKE 1 CAPSULE BY MOUTH EVERY DAY 90 capsule 2   traZODone (DESYREL) 50 MG tablet TAKE 1 TO 1 & 1/2 TABLETS BY MOUTH AT BEDTIME AS NEEDED FOR SLEEP 135 tablet 1   valACYclovir (VALTREX) 1000 MG tablet TAKE 2 TABLETS (2,000 MG TOTAL) BY MOUTH 2 (TWO) TIMES DAILY. FOR 1 DAY 4 tablet 3   No facility-administered medications prior to visit.     Per HPI unless specifically indicated in ROS section below Review of Systems  Objective:  BP 132/70    Pulse (!) 57    Temp (!) 96.3 F (35.7 C) (Temporal)    Ht _0  (1.676 m)    Wt 184 lb 2 oz (83.5 kg)    SpO2 96%    BMI 29.72 kg/m   Wt Readings from Last 3 Encounters:  02/23/21 184 lb 2 oz (83.5 kg)  09/07/20 173 lb 8 oz (78.7 kg)  08/22/20 175 lb (79.4 kg)      Physical Exam Vitals and nursing note reviewed.  Constitutional:      Appearance: Normal appearance. He is not ill-appearing.  Eyes:     Extraocular Movements: Extraocular movements intact.     Conjunctiva/sclera: Conjunctivae normal.     Pupils: Pupils are equal, round, and reactive to light.  Cardiovascular:     Rate and Rhythm: Normal rate and regular rhythm.     Pulses: Normal pulses.     Heart sounds: Normal heart sounds. No murmur heard. Pulmonary:     Effort: Pulmonary effort is normal. No respiratory distress.     Breath sounds: Normal breath sounds. No wheezing, rhonchi or rales.     Comments: Discomfort to palpation at head of right clavicle without obvious erythema,edema Chest:     Chest wall: No tenderness.  Musculoskeletal:     Right lower leg: No edema.     Left lower leg: No edema.     Comments: See HPI for foot exam if done  Skin:    General: Skin is warm and dry.     Findings: No rash.  Neurological:     Mental Status: He is alert.  Psychiatric:        Mood and Affect: Mood normal.        Behavior: Behavior normal.      Results for orders placed or performed in visit  on 02/23/21  TSH  Result Value Ref Range   TSH 3.35 0.35 - 5.50 uIU/mL  CBC with Differential/Platelet  Result Value Ref Range   WBC 7.8 4.0 - 10.5 K/uL   RBC 4.56 4.22 - 5.81 Mil/uL   Hemoglobin 13.7 13.0 - 17.0 g/dL  HCT 41.2 39.0 - 52.0 %   MCV 90.4 78.0 - 100.0 fl   MCHC 33.3 30.0 - 36.0 g/dL   RDW 14.0 11.5 - 15.5 %   Platelets 240.0 150.0 - 400.0 K/uL   Neutrophils Relative % 63.5 43.0 - 77.0 %   Lymphocytes Relative 25.1 12.0 - 46.0 %   Monocytes Relative 8.3 3.0 - 12.0 %   Eosinophils Relative 2.5 0.0 - 5.0 %   Basophils Relative 0.6 0.0 - 3.0 %   Neutro Abs 5.0 1.4 - 7.7 K/uL   Lymphs Abs 2.0 0.7 - 4.0 K/uL   Monocytes Absolute 0.7 0.1 - 1.0 K/uL   Eosinophils Absolute 0.2 0.0 - 0.7 K/uL   Basophils Absolute 0.0 0.0 - 0.1 K/uL  Basic metabolic panel  Result Value Ref Range   Sodium 140 135 - 145 mEq/L   Potassium 4.8 3.5 - 5.1 mEq/L   Chloride 99 96 - 112 mEq/L   CO2 33 (H) 19 - 32 mEq/L   Glucose, Bld 155 (H) 70 - 99 mg/dL   BUN 23 6 - 23 mg/dL   Creatinine, Ser 1.03 0.40 - 1.50 mg/dL   GFR 66.47 >60.00 mL/min   Calcium 10.2 8.4 - 10.5 mg/dL  POCT glycosylated hemoglobin (Hb A1C)  Result Value Ref Range   Hemoglobin A1C 6.7 (A) 4.0 - 5.6 %   HbA1c POC (<> result, manual entry)     HbA1c, POC (prediabetic range)     HbA1c, POC (controlled diabetic range)     Lab Results  Component Value Date   CHOL 140 08/16/2020   HDL 37.00 (L) 08/16/2020   LDLCALC 49 08/05/2013   LDLDIRECT 70.0 08/16/2020   TRIG 268.0 (H) 08/16/2020   CHOLHDL 4 08/16/2020    EKG - sinus bradycardia 50s with frequent PVCs, LAD with LAFB, RBBB, q waves inferiorly largely unchanged from prior 04/2019   DG Chest 2 View CLINICAL DATA:  R collarbone pain, chest pain  EXAM: CHEST - 2 VIEW  COMPARISON:  August 2021  FINDINGS: Cardiomediastinal silhouette is within normal limits. No pleural effusion. No pneumothorax. No focal consolidation. No acute osseous abnormality. Median  sternotomy wires. Surgical clips overlie the left axilla, mediastinum, and right upper quadrant.  IMPRESSION: No acute findings in the chest.  Electronically Signed   By: Albin Felling M.D.   On: 02/23/2021 11:31   Assessment & Plan:  This visit occurred during the SARS-CoV-2 public health emergency.  Safety protocols were in place, including screening questions prior to the visit, additional usage of staff PPE, and extensive cleaning of exam room while observing appropriate contact time as indicated for disinfecting solutions.   Problem List Items Addressed This Visit     Coronary atherosclerosis   Insomnia    Continue trazodone.       Hyperlipidemia associated with type 2 diabetes mellitus (HCC)    Chronic, stable on pravastatin 7m daily - latest LDL was 70 The ASCVD Risk score (Arnett DK, et al., 2019) failed to calculate for the following reasons:   The 2019 ASCVD risk score is only valid for ages 462to 720      Type 2 diabetes, controlled, with peripheral neuropathy (HCC)    Chronic, stable period on current regimen of metformin 5010mbid.       Relevant Medications   DULoxetine (CYMBALTA) 20 MG capsule   traZODone (DESYREL) 50 MG tablet   Other Relevant Orders   POCT glycosylated hemoglobin (Hb A1C) (Completed)  TSH (Completed)   S/P CABG (coronary artery bypass graft)   Type 2 diabetes mellitus with diabetic neuropathy, unspecified ([place])    Presumed diabetes related periph neuropathy.  Continues alpha lipoic acid, gabapentin 639m bid, cymbalta (recent start).  Will drop cymbalta dose to 245mdaily given he felt 3046mose was too strong.       Pain of right clavicle    Check xray to further evaluate tender head of R clavicle. Overall benign exam today.       Relevant Orders   DG Chest 2 View (Completed)   EKG 12-Lead (Completed)   Chest tightness - Primary    Endorsing concerning exertional substernal chest pressure relieved by rest in h/o known CAD s/p  CABG 2011. Previous nitro caused marked drop in blood pressure so will avoid for now. Check EKG, CXR, labs today, and will refer urgently to cardiology for further evaluation. ER precautions reviewed. He will avoid exertion or heavy lifting until seen by cardiologist.       Relevant Orders   TSH (Completed)   CBC with Differential/Platelet (Completed)   Basic metabolic panel (Completed)   Ambulatory referral to Cardiology     Meds ordered this encounter  Medications   valACYclovir (VALTREX) 1000 MG tablet    Sig: Take 2 tablets (2,000 mg total) by mouth 2 (two) times daily. For 1 day    Dispense:  4 tablet    Refill:  3   DULoxetine (CYMBALTA) 20 MG capsule    Sig: TAKE 1 CAPSULE BY MOUTH EVERY night    Dispense:  90 capsule    Refill:  3    Note new dose   traZODone (DESYREL) 50 MG tablet    Sig: TAKE 1 tablet BY MOUTH AT BEDTIME AS NEEDED FOR SLEEP    Dispense:  90 tablet    Refill:  3   Orders Placed This Encounter  Procedures   DG Chest 2 View    Standing Status:   Future    Number of Occurrences:   1    Standing Expiration Date:   02/23/2022    Order Specific Question:   Reason for Exam (SYMPTOM  OR DIAGNOSIS REQUIRED)    Answer:   R collarbone pain, chest pain    Order Specific Question:   Preferred imaging location?    Answer:   [facility]   TSH   CBC with Differential/Platelet   Basic metabolic panel   Ambulatory referral to Cardiology    Referral Priority:   Urgent    Referral Type:   Consultation    Referral Reason:   Specialty Services Required    Requested Specialty:   Cardiology    Number of Visits Requested:   1   POCT glycosylated hemoglobin (Hb A1C)   EKG 12-Lead     Patient Instructions  We will request latest eye exam from Dr [facility]in Mayhew [facility].  Chest xray today.  We will refer you back to cardiology.  Continue current medicines.  I have refilled cymbalta (duloxetine) at lower dose 59m69mghtly.  Good to see you today.   Return in 6 months for wellness visit   Follow up plan: Return in about 6 months (around 08/23/2021), or if symptoms worsen or fail to improve, for medicare wellness visit.  JaviRia Bush

## 2021-02-24 DIAGNOSIS — R0789 Other chest pain: Secondary | ICD-10-CM | POA: Insufficient documentation

## 2021-02-24 DIAGNOSIS — M89319 Hypertrophy of bone, unspecified shoulder: Secondary | ICD-10-CM | POA: Insufficient documentation

## 2021-02-24 DIAGNOSIS — M898X1 Other specified disorders of bone, shoulder: Secondary | ICD-10-CM | POA: Insufficient documentation

## 2021-02-24 NOTE — Assessment & Plan Note (Addendum)
Chronic, stable on pravastatin 40mg  daily - latest LDL was 70 The ASCVD Risk score (Arnett DK, et al., 2019) failed to calculate for the following reasons:   The 2019 ASCVD risk score is only valid for ages 60 to 45

## 2021-02-24 NOTE — Assessment & Plan Note (Signed)
Endorsing concerning exertional substernal chest pressure relieved by rest in h/o known CAD s/p CABG 2011. Previous nitro caused marked drop in blood pressure so will avoid for now. Check EKG, CXR, labs today, and will refer urgently to cardiology for further evaluation. ER precautions reviewed. He will avoid exertion or heavy lifting until seen by cardiologist.

## 2021-02-24 NOTE — Assessment & Plan Note (Signed)
Continue trazodone 

## 2021-02-24 NOTE — Assessment & Plan Note (Addendum)
Check xray to further evaluate tender head of R clavicle. Overall benign exam today.

## 2021-02-24 NOTE — Assessment & Plan Note (Addendum)
Presumed diabetes related periph neuropathy.  Continues alpha lipoic acid, gabapentin 600mg  bid, cymbalta (recent start).  Will drop cymbalta dose to 20mg  daily given he felt 30mg  dose was too strong.

## 2021-02-27 DIAGNOSIS — L570 Actinic keratosis: Secondary | ICD-10-CM | POA: Diagnosis not present

## 2021-02-27 DIAGNOSIS — Z85820 Personal history of malignant melanoma of skin: Secondary | ICD-10-CM | POA: Diagnosis not present

## 2021-02-27 DIAGNOSIS — L218 Other seborrheic dermatitis: Secondary | ICD-10-CM | POA: Diagnosis not present

## 2021-02-27 DIAGNOSIS — L298 Other pruritus: Secondary | ICD-10-CM | POA: Diagnosis not present

## 2021-02-27 DIAGNOSIS — L578 Other skin changes due to chronic exposure to nonionizing radiation: Secondary | ICD-10-CM | POA: Diagnosis not present

## 2021-02-27 DIAGNOSIS — Z859 Personal history of malignant neoplasm, unspecified: Secondary | ICD-10-CM | POA: Diagnosis not present

## 2021-02-27 DIAGNOSIS — Z872 Personal history of diseases of the skin and subcutaneous tissue: Secondary | ICD-10-CM | POA: Diagnosis not present

## 2021-02-27 DIAGNOSIS — L249 Irritant contact dermatitis, unspecified cause: Secondary | ICD-10-CM | POA: Diagnosis not present

## 2021-02-27 DIAGNOSIS — Z86018 Personal history of other benign neoplasm: Secondary | ICD-10-CM | POA: Diagnosis not present

## 2021-02-27 DIAGNOSIS — L821 Other seborrheic keratosis: Secondary | ICD-10-CM | POA: Diagnosis not present

## 2021-03-07 ENCOUNTER — Other Ambulatory Visit: Payer: Self-pay

## 2021-03-07 ENCOUNTER — Ambulatory Visit (INDEPENDENT_AMBULATORY_CARE_PROVIDER_SITE_OTHER): Payer: Medicare Other | Admitting: Nurse Practitioner

## 2021-03-07 ENCOUNTER — Encounter: Payer: Self-pay | Admitting: Nurse Practitioner

## 2021-03-07 VITALS — BP 124/60 | HR 59 | Ht 67.0 in | Wt 184.0 lb

## 2021-03-07 DIAGNOSIS — E785 Hyperlipidemia, unspecified: Secondary | ICD-10-CM

## 2021-03-07 DIAGNOSIS — I10 Essential (primary) hypertension: Secondary | ICD-10-CM

## 2021-03-07 DIAGNOSIS — I25110 Atherosclerotic heart disease of native coronary artery with unstable angina pectoris: Secondary | ICD-10-CM | POA: Diagnosis not present

## 2021-03-07 DIAGNOSIS — I200 Unstable angina: Secondary | ICD-10-CM | POA: Diagnosis not present

## 2021-03-07 MED ORDER — ISOSORBIDE MONONITRATE ER 30 MG PO TB24: 15.0000 mg | ORAL_TABLET | Freq: Every day | ORAL | 1 refills | Status: DC

## 2021-03-07 NOTE — Patient Instructions (Addendum)
Medication Instructions:  Your physician has recommended you make the following change in your medication:   START taking isosorbide mononitrate (IMDUR) 15 mg daily   *If you need a refill on your cardiac medications before your next appointment, please call your pharmacy*   Lab Work: None ordered  If you have labs (blood work) drawn today and your tests are completely normal, you will receive your results only by: [place] (if you have MyChart) OR A paper copy in the mail If you have any lab test that is abnormal or we need to change your treatment, we will call you to review the results.   Testing/Procedures: [facility] [place]  Your caregiver has ordered a Stress Test with nuclear imaging. The purpose of this test is to evaluate the blood supply to your heart muscle. This procedure is referred to as a "Non-Invasive Stress Test." This is because other than having an IV started in your vein, nothing is inserted or "invades" your body. Cardiac stress tests are done to find areas of poor blood flow to the heart by determining the extent of coronary artery disease (CAD). Some patients exercise on a treadmill, which naturally increases the blood flow to your heart, while others who are  unable to walk on a treadmill due to physical limitations have a pharmacologic/chemical stress agent called Lexiscan . This medicine will mimic walking on a treadmill by temporarily increasing your coronary blood flow.   Please note: these test may take anywhere between 2-4 hours to complete  PLEASE REPORT TO [facility] AT THE FIRST DESK WILL DIRECT YOU WHERE TO GO  Date of Procedure:_____________________________________  Arrival Time for Procedure:______________________________  Instructions regarding medication:   X : Hold diabetes medication morning of procedure   PLEASE NOTIFY THE OFFICE AT LEAST 24 HOURS IN ADVANCE IF YOU ARE UNABLE TO KEEP YOUR APPOINTMENT.   [phone] AND  PLEASE NOTIFY NUCLEAR MEDICINE AT [facility] AT LEAST 24 HOURS IN ADVANCE IF YOU ARE UNABLE TO KEEP YOUR APPOINTMENT. [phone]  How to prepare for your Myoview test:  Do not eat or drink after midnight No caffeine for 24 hours prior to test No smoking 24 hours prior to test. Your medication may be taken with water.  If your doctor stopped a medication because of this test, do not take that medication. Ladies, please do not wear dresses.  Skirts or pants are appropriate. Please wear a short sleeve shirt. No perfume, cologne or lotion. Wear comfortable walking shoes. No heels!    Follow-Up: At [facility], you and your health needs are our priority.  As part of our continuing mission to provide you with exceptional heart care, we have created designated Provider Care Teams.  These Care Teams include your primary Cardiologist (physician) and Advanced Practice Providers (APPs -  Physician Assistants and Nurse Practitioners) who all work together to provide you with the care you need, when you need it.  We recommend signing up for the patient portal called "MyChart".  Sign up information is provided on this After Visit Summary.  MyChart is used to connect with patients for Virtual Visits (Telemedicine).  Patients are able to view lab/test results, encounter notes, upcoming appointments, etc.  Non-urgent messages can be sent to your provider as well.   To learn more about what you can do with MyChart, go to [email or web address].    Your next appointment:   10 day(s)  The format for your next appointment:   In Person  Provider:   You may see Ida Rogue, MD or one of the following Advanced Practice Providers on your designated Care Team:   Murray Hodgkins, NP :1}    Other Instructions N/A

## 2021-03-07 NOTE — Progress Notes (Signed)
Office Visit    Patient Name: Aaron Stafford Date of Encounter: 03/07/2021  Primary Care Provider:  Ria Bush, MD Primary Cardiologist:  Ida Rogue, MD  Chief Complaint    87 year old male with a history of CAD status post CABG times 4 in 1995, hypertension, hyperlipidemia, and diabetes, who presents for follow-up related to chest tightness.  Past Medical History    Past Medical History:  Diagnosis Date   CAD (coronary artery disease)    a. 1995 s/p CABG x 4 (LIMA->LAD, VG->OM1->OM2, VG->RCA); b. 07/2009 PCI: LM 60, LAD 100ost, LCX 100p, RCA 100, LIMA->LAD atretic, VG->OM1->OM2 patent w/ collats to dLAD, VG->RCA 95 (3.5x15 Vision BMS). EF 55%.   Chronic rhinitis    [facility]   COVID-19 virus infection 09/2019   Diabetes mellitus type II    Diverticulosis 06/2011   by CT and colonoscopy   Fatty liver 06/2011   by CT   GERD (gastroesophageal reflux disease)    History of BPH    History of shingles    HLD (hyperlipidemia)    HTN (hypertension)    Ischemic heart disease    Melanoma ([place]) 12/2015   L forearm, scalp   MI (myocardial infarction) ([place]) 1995   Shingles rash 09/26/2012   Sialadenitis 05/01/2017   Past Surgical History:  Procedure Laterality Date   ANKLE SURGERY  03/2002   Left ankle fracture   CARDIAC CATHETERIZATION     CHOLECYSTECTOMY  1985   COLONOSCOPY     diverticulosis, tortuous colon with looping [name])   CORONARY ARTERY BYPASS GRAFT  05/1993   x6 MI Attemped angioplasty   CORONARY STENT PLACEMENT  07/25/2009   Multi-Link VISION Cobalt chromium    CYSTOSCOPY  10/03/08   Normal-Dr. Bernardo Heater   INGUINAL HERNIA REPAIR  11/2000   and ventral hernia repair    Allergies  Allergies  Allergen Reactions   Morphine Sulfate     REACTION: Hallucinations   Nitroglycerin     REACTION: Decreased BP   Contrast Media [Iodinated Contrast Media] Rash    Severe hives    History of Present Illness    87 year old male with the above  past medical history including coronary artery disease, hypertension, hyperlipidemia, and diabetes.  He is status post CABG x 4 in 1995 with a LIMA to the LAD, vein graft to the RCA, and sequential vein graft to the OM1 and OM2.  In June 2011, he required bare-metal stent placement in the vein graft to the RCA.  At that time, he had native three-vessel occlusive disease, an atretic LIMA to the LAD, and a patent sequential vein graft to the OM1 and OM 2 with left to left collaterals to the distal LAD.  [name] was last seen in cardiology clinic (telemedicine visit) in March 2022, at which time he was doing well.  Over the past month, [name] has had 3 episodes of exertional chest tightness, with one episode radiating to his right jaw, which was reminiscent of his prior anginal equivalent.  He is very active, and walks frequently and as he describes, fast, on a daily basis.  Despite a fairly high level of activity, he does not experience chest tightness on a regular basis, and as noted, it is only been about once a week over the past 3 to 4 weeks.  Symptoms typically last about 5 minutes and resolve with rest.  Prior to coming into the office today, he had to walk quickly back out to his  car to get a mask and then back into the office, and he was completely asymptomatic.  He has no associated symptoms with tightness and denies dyspnea, palpitations, PND, orthopnea, dizziness, syncope, edema, or early satiety.  Home Medications    Current Outpatient Medications  Medication Sig Dispense Refill   Alpha-Lipoic Acid 600 MG CAPS Take 1 capsule (600 mg total) by mouth 2 (two) times daily.     aspirin 81 MG tablet Take 81 mg by mouth daily.     Blood Glucose Monitoring Suppl (ONETOUCH VERIO FLEX SYSTEM) w/Device KIT Check blood sugar once daily 1 kit 0   Cholecalciferol (VITAMIN D3) 1000 UNITS CAPS Take 1 capsule (1,000 Units total) by mouth daily. 30 capsule    Cranberry 500 MG CAPS Take 2 capsules (1,000  mg total) by mouth daily.     diclofenac Sodium (VOLTAREN) 1 % GEL Apply 2 g topically 3 (three) times daily.     DULoxetine (CYMBALTA) 20 MG capsule TAKE 1 CAPSULE BY MOUTH EVERY night 90 capsule 3   finasteride (PROSCAR) 5 MG tablet TAKE 1 TABLET BY MOUTH EVERY DAY 90 tablet 2   fluticasone (FLONASE) 50 MCG/ACT nasal spray SPRAY 2 SPRAYS INTO EACH NOSTRIL EVERY DAY 48 mL 3   furosemide (LASIX) 20 MG tablet TAKE 1 TABLET BY MOUTH EVERY DAY 90 tablet 3   gabapentin (NEURONTIN) 300 MG capsule TAKE 2 CAPSULES BY MOUTH TWICE A DAY 360 capsule 1   glucose blood (ONETOUCH VERIO) test strip Check blood sugar once daily 100 strip 3   hydrochlorothiazide (HYDRODIURIL) 25 MG tablet TAKE 1 TABLET BY MOUTH EVERY DAY 90 tablet 3   isosorbide mononitrate (IMDUR) 30 MG 24 hr tablet Take 0.5 tablets (15 mg total) by mouth daily. 90 tablet 1   losartan (COZAAR) 50 MG tablet Take 1 tablet (50 mg total) by mouth daily. 90 tablet 3   metFORMIN (GLUCOPHAGE) 500 MG tablet TAKE 1 TABLET BY MOUTH 2 TIMES DAILY WITH A MEAL. 180 tablet 3   OneTouch Delica Lancets 78M MISC Check blood sugar once daily 100 each 3   polyethylene glycol powder (GLYCOLAX/MIRALAX) 17 GM/SCOOP powder TAKE 8.5-17 G BY MOUTH DAILY AS NEEDED FOR MODERATE CONSTIPATION. 510 g 1   pravastatin (PRAVACHOL) 40 MG tablet TAKE 1 TABLET BY MOUTH EVERYDAY AT BEDTIME 90 tablet 3   traZODone (DESYREL) 50 MG tablet TAKE 1 tablet BY MOUTH AT BEDTIME AS NEEDED FOR SLEEP 90 tablet 3   valACYclovir (VALTREX) 1000 MG tablet Take 2 tablets (2,000 mg total) by mouth 2 (two) times daily. For 1 day 4 tablet 3   vitamin B-12 (CYANOCOBALAMIN) 250 MCG tablet Take 250 mcg by mouth once a week.      No current facility-administered medications for this visit.     Review of Systems    Over the past month, has had about 3 episodes of exertional chest tightness without associated symptoms.  He denies dyspnea, palpitations, PND, orthopnea, dizziness, syncope, edema, or  early satiety.  All other systems reviewed and are otherwise negative except as noted above.    Physical Exam    VS:  BP 124/60 (BP Location: Left Arm, Patient Position: Sitting, Cuff Size: Normal)    Pulse (!) 59    Ht $R'5\' 7"'IA$  (1.702 m)    Wt 184 lb (83.5 kg)    SpO2 98%    BMI 28.82 kg/m  , BMI Body mass index is 28.82 kg/m.     GEN: Well nourished, well developed,  in no acute distress. HEENT: normal. Neck: Supple, no JVD, carotid bruits, or masses. Cardiac: RRR, no murmurs, rubs, or gallops. No clubbing, cyanosis, edema.  Radials 2+/PT 1+ and equal bilaterally.  Respiratory:  Respirations regular and unlabored, clear to auscultation bilaterally. GI: Soft, nontender, nondistended, BS + x 4. MS: no deformity or atrophy. Skin: warm and dry, no rash. Neuro:  Strength and sensation are intact. Psych: Normal affect.  Accessory Clinical Findings    ECG personally reviewed by me today -sinus bradycardia, 59, first-degree AV block, left axis deviation, prior inferior infarct, right bundle branch block, poor R wave progression- no acute changes.  Lab Results  Component Value Date   WBC 7.8 02/23/2021   HGB 13.7 02/23/2021   HCT 41.2 02/23/2021   MCV 90.4 02/23/2021   PLT 240.0 02/23/2021   Lab Results  Component Value Date   CREATININE 1.03 02/23/2021   BUN 23 02/23/2021   NA 140 02/23/2021   K 4.8 02/23/2021   CL 99 02/23/2021   CO2 33 (H) 02/23/2021   Lab Results  Component Value Date   ALT 18 08/16/2020   AST 18 08/16/2020   ALKPHOS 55 08/16/2020   BILITOT 0.7 08/16/2020   Lab Results  Component Value Date   CHOL 140 08/16/2020   HDL 37.00 (L) 08/16/2020   LDLCALC 49 08/05/2013   LDLDIRECT 70.0 08/16/2020   TRIG 268.0 (H) 08/16/2020   CHOLHDL 4 08/16/2020    Lab Results  Component Value Date   HGBA1C 6.7 (A) 02/23/2021    Assessment & Plan    1.  Unstable angina/CAD: Over the past month, patient has had about 3 episodes of exertional chest tightness without  associated symptoms.  1 episode did radiate to his jaw.  All symptoms resolved with slowing down or rest after about 5 minutes.  As outlined above, he is very active and walks on a regular basis and typically does not experience any symptoms, with the exception of these 3 occasions.  We discussed options for management today.  I am adding isosorbide mononitrate 15 mg daily.  I see that he was very responsive to sublingual nitroglycerin in the setting of his myocardial infarction many years ago and we discussed that I would not expect him to have significant hypotension on low-dose long-acting nitrates.  I will also arrange for a Lexiscan Myoview to rule out ischemia.  He understands that if Myoview is high risk, he may require diagnostic catheterization.  His preference would be to avoid cath in the setting of advanced age and contrast allergy.  He will otherwise continue on aspirin and statin therapy.  2.  Essential hypertension: Stable.  Continue current regimen.  3.  Hyperlipidemia: LDL of 70 in June.  He remains on pravastatin therapy.  We may wish to consider adding Zetia to further push his LDL in the future.  4.  Type 2 diabetes mellitus: A1c 6.7.  Management per primary care.  5.  Disposition: Follow-up stress testing.  Follow-up in clinic in approximately 2 to 3 weeks.  Murray Hodgkins, NP 03/07/2021, 2:38 PM

## 2021-03-12 ENCOUNTER — Encounter: Payer: Self-pay | Admitting: Family Medicine

## 2021-03-13 ENCOUNTER — Other Ambulatory Visit: Payer: Self-pay

## 2021-03-13 ENCOUNTER — Encounter
Admission: RE | Admit: 2021-03-13 | Discharge: 2021-03-13 | Disposition: A | Discharge status: Home / Self Care | Payer: Medicare Other | Source: Ambulatory Visit | Attending: Nurse Practitioner | Admitting: Nurse Practitioner

## 2021-03-13 DIAGNOSIS — I200 Unstable angina: Secondary | ICD-10-CM | POA: Insufficient documentation

## 2021-03-13 LAB — NM MYOCAR MULTI W/SPECT W/WALL MOTION / EF
Estimated workload: 1
Exercise duration (min): 0 min
Exercise duration (sec): 0 s
LV dias vol: 134 mL (ref 62–150)
LV sys vol: 77 mL
MPHR: 135 {beats}/min
Nuc Stress EF: 43 %
Peak HR: 67 {beats}/min
Percent HR: 49 %
Rest HR: 56 {beats}/min
Rest Nuclear Isotope Dose: 10.1 mCi
SDS: 5
SRS: 16
SSS: 19
ST Depression (mm): 0 mm
Stress Nuclear Isotope Dose: 30.4 mCi
TID: 1.15

## 2021-03-13 MED ORDER — TECHNETIUM TC 99M TETROFOSMIN IV KIT
10.1000 | PACK | Freq: Once | INTRAVENOUS | Status: AC | PRN
  Administered 2021-03-13: 08:00:00 10.1 via INTRAVENOUS

## 2021-03-13 MED ORDER — REGADENOSON 0.4 MG/5ML IV SOLN
0.4000 mg | Freq: Once | INTRAVENOUS | Status: AC
  Administered 2021-03-13: 09:00:00 0.4 mg via INTRAVENOUS

## 2021-03-13 MED ORDER — TECHNETIUM TC 99M TETROFOSMIN IV KIT
30.0000 | PACK | Freq: Once | INTRAVENOUS | Status: AC | PRN
  Administered 2021-03-13: 09:00:00 30.41 via INTRAVENOUS

## 2021-03-14 ENCOUNTER — Telehealth: Payer: Self-pay | Admitting: *Deleted

## 2021-03-14 DIAGNOSIS — I200 Unstable angina: Secondary | ICD-10-CM

## 2021-03-14 NOTE — Telephone Encounter (Signed)
Appointments have been taken care of by scheduling.

## 2021-03-14 NOTE — Telephone Encounter (Signed)
-----   Message from Theora Gianotti, NP sent at 03/13/2021  5:04 PM EST ----- Like prior stress test, this shows a fixed defect reflective of prior heart attack in his inferolateral wall.  No evidence of new areas of poor blood flow.  However, his heart squeezing function is significantly reduced on this study @ 30%.  I recommend obtaining an echocardiogram to either confirm or refute the finding of his heart squeeze on this study (sometimes heart squeezing function is incorrect on these studies).  If heart squeeze is truly low, we may need to consider additional cardiac imaging/cath.

## 2021-03-14 NOTE — Telephone Encounter (Signed)
Reviewed information with provider and he would like to reschedule patients appt to be after echo has been done. Will review with scheduling for further assistance.

## 2021-03-14 NOTE — Telephone Encounter (Signed)
Spoke with patient and reviewed results and recommendations. He verbalized understanding and was agreeable with plan. Will reach out to someone in scheduling for their assistance and to call patient back with the information.

## 2021-03-14 NOTE — Telephone Encounter (Signed)
Left voicemail message to call back for review of results.  

## 2021-03-14 NOTE — Telephone Encounter (Signed)
Patient returning call.

## 2021-03-16 ENCOUNTER — Ambulatory Visit: Payer: Medicare Other | Admitting: Nurse Practitioner

## 2021-03-22 ENCOUNTER — Other Ambulatory Visit: Payer: Self-pay

## 2021-03-22 ENCOUNTER — Ambulatory Visit (INDEPENDENT_AMBULATORY_CARE_PROVIDER_SITE_OTHER): Payer: Medicare Other

## 2021-03-22 DIAGNOSIS — I200 Unstable angina: Secondary | ICD-10-CM

## 2021-03-22 LAB — ECHOCARDIOGRAM COMPLETE
AR max vel: 2.19 cm2
AV Area VTI: 2.25 cm2
AV Area mean vel: 2.06 cm2
AV Mean grad: 5 mmHg
AV Peak grad: 8.1 mmHg
Ao pk vel: 1.42 m/s
Area-P 1/2: 3.53 cm2
Calc EF: 56.8 %
MV M vel: 4.96 m/s
MV Peak grad: 98.4 mmHg
S' Lateral: 3.6 cm
Single Plane A2C EF: 58.1 %
Single Plane A4C EF: 55.8 %

## 2021-03-23 ENCOUNTER — Telehealth: Payer: Self-pay | Admitting: Cardiovascular Disease

## 2021-03-23 NOTE — Telephone Encounter (Signed)
Patient would like results for his echo and stress test. Please call.

## 2021-03-23 NOTE — Telephone Encounter (Signed)
Theora Gianotti, NP  03/23/2021 10:31 AM EST     Low-normal heart squeezing function with mild stiffness to the left ventricle.  Pressure on the right side of the heart is moderately elevated.  The mitral valve is mildly leaky.  We did this study b/c of reported low heart squeeze on stress test.  This echo is very reassuring.  Keep f/u on 2/9 and we'll discuss further.  Sometimes we may need to adjust fluid pill when pressures are elevated on the right side of the heart - will discuss.

## 2021-03-23 NOTE — Telephone Encounter (Signed)
I spoke with the patient regarding his echo results/ findings as per Ignacia Bayley, NP.  The patient voices understanding of these results and is agreeable to keeping his follow up appointment on 03/29/21 at 1055 with Gerald Stabs, NP.  He was very appreciative of the call back today.

## 2021-03-29 ENCOUNTER — Other Ambulatory Visit: Payer: Self-pay

## 2021-03-29 ENCOUNTER — Encounter: Payer: Self-pay | Admitting: Nurse Practitioner

## 2021-03-29 ENCOUNTER — Ambulatory Visit (INDEPENDENT_AMBULATORY_CARE_PROVIDER_SITE_OTHER): Payer: Medicare Other | Admitting: Nurse Practitioner

## 2021-03-29 VITALS — BP 130/70 | HR 60 | Ht 67.0 in | Wt 182.0 lb

## 2021-03-29 DIAGNOSIS — E1140 Type 2 diabetes mellitus with diabetic neuropathy, unspecified: Secondary | ICD-10-CM | POA: Diagnosis not present

## 2021-03-29 DIAGNOSIS — I25110 Atherosclerotic heart disease of native coronary artery with unstable angina pectoris: Secondary | ICD-10-CM

## 2021-03-29 DIAGNOSIS — E785 Hyperlipidemia, unspecified: Secondary | ICD-10-CM | POA: Diagnosis not present

## 2021-03-29 DIAGNOSIS — I10 Essential (primary) hypertension: Secondary | ICD-10-CM | POA: Diagnosis not present

## 2021-03-29 DIAGNOSIS — I200 Unstable angina: Secondary | ICD-10-CM

## 2021-03-29 NOTE — Progress Notes (Signed)
Office Visit    Patient Name: Aaron Stafford Date of Encounter: 03/29/2021  Primary Care Provider:  Ria Bush, MD Primary Cardiologist:  Aaron Rogue, MD  Chief Complaint    87 year old male with a history of CAD status post CABG x4 in 1995, hypertension, hyperlipidemia, and diabetes, who presents for follow-up after recent stress testing and echocardiogram.  Past Medical History    Past Medical History:  Diagnosis Date   CAD (coronary artery disease)    a. 1995 s/p CABG x 4 (LIMA->LAD, VG->OM1->OM2, VG->RCA); b. 07/2009 PCI: LM 24, LAD 100ost, LCX 100p, RCA 100, LIMA->LAD atretic, VG->OM1->OM2 patent w/ collats to dLAD, VG->RCA 95 (3.5x15 Vision BMS). EF 55%; c. 02/2021 MV: EF 30% (50-55% by echo), large inferolateral scar w/ HK. No ischemia.   Chronic rhinitis    ENT [facility]   COVID-19 virus infection 09/2019   Diabetes mellitus type II    Diastolic dysfunction    a. 03/2021 Echo: EF 50-55%, no rwma, GrI DD. RVSP 35.36mHg. Mild MR.   Diverticulosis 06/2011   by CT and colonoscopy   Fatty liver 06/2011   by CT   GERD (gastroesophageal reflux disease)    History of BPH    History of shingles    HLD (hyperlipidemia)    HTN (hypertension)    Ischemic heart disease    [place]   L forearm, scalp   [place]   Shingles rash 09/26/2012   Sialadenitis 05/01/2017   Past Surgical History:  Procedure Laterality Date   ANKLE SURGERY  03/2002   Left ankle fracture   CARDIAC CATHETERIZATION     CHOLECYSTECTOMY  1985   COLONOSCOPY     diverticulosis, tortuous colon with looping (Aaron Stafford   CORONARY ARTERY BYPASS GRAFT  05/1993   x6 MI Attemped angioplasty   CORONARY STENT PLACEMENT  07/25/2009   Multi-Link VISION Cobalt chromium    CYSTOSCOPY  10/03/08   Normal-Dr. SBernardo Stafford  INGUINAL HERNIA REPAIR  11/2000   and ventral hernia repair    Allergies  Allergies  Allergen Reactions   Morphine Sulfate     REACTION:  Hallucinations   Nitroglycerin     REACTION: Decreased BP   Contrast Media [Iodinated Contrast Media] Rash    Severe hives    History of Present Illness    87year old male with above past medical history including CAD, hypertension, hyperlipidemia, and diabetes.  He is status post CABG times 06/06/1993, with a LIMA to the LAD, vein graft to the RCA, and sequential vein graft to the OM1 and OM 2.  In June 2011, he required bare-metal stenting of the vein graft to the RCA.  At that time, he had native three-vessel occlusive disease, an atretic LIMA to LAD, and patent sequential vein graft to OM1 and OM 2, with left to left collaterals to the distal LAD.  I saw Aaron Stafford clinic on 1/18, at which time he reported 3 episodes of exertional chest tightness, though overall, his exercise tolerance was good.  Imdur 1236mdaily was added and he underwent a lexiscan MV on 1/24, which showed a large inferolateral scar w/o ischemia.  EF was 30% w/ inferolateral HK.  With the finding of LV dysfxn by SPECT, I obtained an echo, which showed low-nl LV fxn w/ an EF of 50-55%, GrI DD, nl RV fxn, RVSP of 35.36m57m, and mild MR.  Since his last visit, Aaron Stafford remained active, walking daily, w/o recurrent chest  pain or dyspnea.  He denies palpitations, pnd, orthopnea, n, v, dizziness, syncope, edema, weight gain, or early satiety.   Home Medications    Current Outpatient Medications  Medication Sig Dispense Refill   Alpha-Lipoic Acid 600 MG CAPS Take 1 capsule (600 mg total) by mouth 2 (two) times daily.     aspirin 81 MG tablet Take 81 mg by mouth daily.     Blood Glucose Monitoring Suppl (ONETOUCH VERIO FLEX SYSTEM) w/Device KIT Check blood sugar once daily 1 kit 0   Cholecalciferol (VITAMIN D3) 1000 UNITS CAPS Take 1 capsule (1,000 Units total) by mouth daily. 30 capsule    Cranberry 500 MG CAPS Take 2 capsules (1,000 mg total) by mouth daily.     diclofenac Sodium (VOLTAREN) 1 % GEL Apply 2 g topically 3  (three) times daily.     DULoxetine (CYMBALTA) 20 MG capsule TAKE 1 CAPSULE BY MOUTH EVERY night 90 capsule 3   finasteride (PROSCAR) 5 MG tablet TAKE 1 TABLET BY MOUTH EVERY DAY 90 tablet 2   fluticasone (FLONASE) 50 MCG/ACT nasal spray SPRAY 2 SPRAYS INTO EACH NOSTRIL EVERY DAY 48 mL 3   furosemide (LASIX) 20 MG tablet TAKE 1 TABLET BY MOUTH EVERY DAY 90 tablet 3   gabapentin (NEURONTIN) 300 MG capsule TAKE 2 CAPSULES BY MOUTH TWICE A DAY 360 capsule 1   glucose blood (ONETOUCH VERIO) test strip Check blood sugar once daily 100 strip 3   hydrochlorothiazide (HYDRODIURIL) 25 MG tablet TAKE 1 TABLET BY MOUTH EVERY DAY 90 tablet 3   isosorbide mononitrate (IMDUR) 30 MG 24 hr tablet Take 0.5 tablets (15 mg total) by mouth daily. 90 tablet 1   losartan (COZAAR) 50 MG tablet Take 1 tablet (50 mg total) by mouth daily. 90 tablet 3   metFORMIN (GLUCOPHAGE) 500 MG tablet TAKE 1 TABLET BY MOUTH 2 TIMES DAILY WITH A MEAL. 180 tablet 3   OneTouch Delica Lancets 23J MISC Check blood sugar once daily 100 each 3   polyethylene glycol powder (GLYCOLAX/MIRALAX) 17 GM/SCOOP powder TAKE 8.5-17 G BY MOUTH DAILY AS NEEDED FOR MODERATE CONSTIPATION. 510 g 1   pravastatin (PRAVACHOL) 40 MG tablet TAKE 1 TABLET BY MOUTH EVERYDAY AT BEDTIME 90 tablet 3   traZODone (DESYREL) 50 MG tablet TAKE 1 tablet BY MOUTH AT BEDTIME AS NEEDED FOR SLEEP 90 tablet 3   valACYclovir (VALTREX) 1000 MG tablet Take 2 tablets (2,000 mg total) by mouth 2 (two) times daily. For 1 day 4 tablet 3   vitamin B-12 (CYANOCOBALAMIN) 250 MCG tablet Take 250 mcg by mouth once a week.      No current facility-administered medications for this visit.     Review of Systems    He denies chest pain, palpitations, dyspnea, pnd, orthopnea, n, v, dizziness, syncope, edema, weight gain, or early satiety.  All other systems reviewed and are otherwise negative except as noted above.    Physical Exam    VS:  BP 130/70 (BP Location: Left Arm, Patient  Position: Sitting, Cuff Size: Normal)    Pulse 60    Ht _0  (1.702 m)    Wt 182 lb (82.6 kg)    SpO2 97%    BMI 28.51 kg/m  , BMI Body mass index is 28.51 kg/m.     GEN: Well nourished, well developed, in no acute distress. HEENT: normal. Neck: Supple, no JVD, carotid bruits, or masses. Cardiac: RRR, no murmurs, rubs, or gallops. No clubbing, cyanosis, edema.  Radials 2+PT  1+ and equal bilaterally.  Respiratory:  Respirations regular and unlabored, clear to auscultation bilaterally. GI: Soft, nontender, nondistended, BS + x 4. MS: no deformity or atrophy. Skin: warm and dry, no rash. Neuro:  Strength and sensation are intact. Psych: Normal affect.  Accessory Clinical Findings     Lab Results  Component Value Date   WBC 7.8 02/23/2021   HGB 13.7 02/23/2021   HCT 41.2 02/23/2021   MCV 90.4 02/23/2021   PLT 240.0 02/23/2021   Lab Results  Component Value Date   CREATININE 1.03 02/23/2021   BUN 23 02/23/2021   NA 140 02/23/2021   K 4.8 02/23/2021   CL 99 02/23/2021   CO2 33 (H) 02/23/2021   Lab Results  Component Value Date   ALT 18 08/16/2020   AST 18 08/16/2020   ALKPHOS 55 08/16/2020   BILITOT 0.7 08/16/2020   Lab Results  Component Value Date   CHOL 140 08/16/2020   HDL 37.00 (L) 08/16/2020   LDLCALC 49 08/05/2013   LDLDIRECT 70.0 08/16/2020   TRIG 268.0 (H) 08/16/2020   CHOLHDL 4 08/16/2020    Lab Results  Component Value Date   HGBA1C 6.7 (A) 02/23/2021    Assessment & Plan    1.  Unstable angina/CAD: Patient was seen in January after having 3 separate episodes of exertional chest pain.  He walks regularly and was having infrequent symptoms.  Obtained a Lexiscan Myoview which showed inferolateral scar without ischemia, though EF was noted to be 30%.  Follow-up echo showed EF of 50 to 55% without regional wall motion abnormalities.  Since his last visit, patient has not had any recurrent chest discomfort and remains active, walking regularly.  I will  continue isosorbide 15 mg daily as well as aspirin, statin, and ARB.  2.  Essential hypertension: Stable on current regimen.  3.  Hyperlipidemia: LDL of 70 in June 2022.  He remains on pravastatin therapy.  4.  Type 2 diabetes mellitus: A1c of 6.7 in January.  Management per primary team.  5.  Disposition: Follow-up in clinic in 4 to 6 months or sooner if necessary.   Murray Hodgkins, NP 03/29/2021, 12:18 PM

## 2021-03-29 NOTE — Patient Instructions (Signed)
Medication Instructions:  No changes at this time  *If you need a refill on your cardiac medications before your next appointment, please call your pharmacy*   Lab Work: None  If you have labs (blood work) drawn today and your tests are completely normal, you will receive your results only by: [place] (if you have MyChart) OR A paper copy in the mail If you have any lab test that is abnormal or we need to change your treatment, we will call you to review the results.   Testing/Procedures: None   Follow-Up: At [facility], you and your health needs are our priority.  As part of our continuing mission to provide you with exceptional heart care, we have created designated Provider Care Teams.  These Care Teams include your primary Cardiologist (physician) and Advanced Practice Providers (APPs -  Physician Assistants and Nurse Practitioners) who all work together to provide you with the care you need, when you need it.   Your next appointment:   3 month(s)  The format for your next appointment:   In Person  Provider:   Ida Rogue, MD or Murray Hodgkins, NP

## 2021-04-13 ENCOUNTER — Other Ambulatory Visit: Payer: Self-pay

## 2021-04-13 ENCOUNTER — Ambulatory Visit
Admission: EM | Admit: 2021-04-13 | Discharge: 2021-04-13 | Disposition: A | Discharge status: Home / Self Care | Payer: Medicare Other | Attending: Family | Admitting: Family

## 2021-04-13 DIAGNOSIS — J0140 Acute pansinusitis, unspecified: Secondary | ICD-10-CM | POA: Diagnosis not present

## 2021-04-13 DIAGNOSIS — R0982 Postnasal drip: Secondary | ICD-10-CM

## 2021-04-13 MED ORDER — AMOXICILLIN-POT CLAVULANATE 875-125 MG PO TABS: 1.0000 | ORAL_TABLET | Freq: Two times a day (BID) | ORAL | 0 refills | Status: AC

## 2021-04-13 NOTE — ED Provider Notes (Signed)
[facility]    CSN: [id number] Arrival date & time: 04/13/21  1065      History   Chief Complaint Chief Complaint  Patient presents with   Nasal Congestion    HPI Aaron Stafford is a 87 y.o. male.   87 year old male presents with nasal congestion, sinus pressure and right sided sinus pain and right ear discomfort for the past 5 to 6 days. Was improving slightly but symptoms worsened yesterday with more sinus pressure. Coughing up discolored mucus. Denies any fever, shortness of breath, or GI symptoms. Has taken Sudafed with no relief. His wife was sick last week with similar symptoms and dx with sinusitis. Non smoker. Other chronic health issues include HTN, CAD, type 2 DM, GERD, BPH, hyperlipidemia, and insomnia. Currently on Losartan, Imdur, HCTZ, Lasix, Pravachol, Metformin, Proscar, Gabapentin, Cymbalta and aspirin daily as well as multiple supplements. Also taking Flonase and Trazodone prn.   The history is provided by the patient.   Past Medical History:  Diagnosis Date   CAD (coronary artery disease)    a. 1995 s/p CABG x 4 (LIMA->LAD, VG->OM1->OM2, VG->RCA); b. 07/2009 PCI: LM 60, LAD 100ost, LCX 100p, RCA 100, LIMA->LAD atretic, VG->OM1->OM2 patent w/ collats to dLAD, VG->RCA 95 (3.5x15 Vision BMS). EF 55%; c. 02/2021 MV: EF 30% (50-55% by echo), large inferolateral scar w/ HK. No ischemia.   Chronic rhinitis    [facility]   COVID-19 virus infection 09/2019   Diabetes mellitus type II    Diastolic dysfunction    a. 03/2021 Echo: EF 50-55%, no rwma, GrI DD. RVSP 35.39mmHg. Mild MR.   Diverticulosis 06/2011   by CT and colonoscopy   Fatty liver 06/2011   by CT   GERD (gastroesophageal reflux disease)    History of BPH    History of shingles    HLD (hyperlipidemia)    HTN (hypertension)    Ischemic heart disease    Melanoma (HCC) 12/2015   L forearm, scalp   MI (myocardial infarction) (HCC) 1995   Shingles rash 09/26/2012   Sialadenitis 05/01/2017     Patient Active Problem List   Diagnosis Date Noted   Pain of right clavicle 02/24/2021   Chest tightness 02/24/2021   New onset headache 09/07/2020   Chronic constipation 08/22/2020   Right hand pain 08/18/2019   Tremor 08/14/2018   Urinary urgency 03/11/2017   Type 2 diabetes mellitus with diabetic neuropathy, unspecified (HCC) 10/05/2015   Advanced care planning/counseling discussion 06/26/2014   Stress 08/09/2013   BPH with obstruction/lower urinary tract symptoms    Medicare annual wellness visit, subsequent 03/25/2011   S/P CABG (coronary artery bypass graft) 06/07/2010   HTN (hypertension)    Hyperlipidemia associated with type 2 diabetes mellitus (HCC)    Type 2 diabetes, controlled, with peripheral neuropathy (HCC)    GERD (gastroesophageal reflux disease)    Chronic rhinitis    Vitamin D deficiency 04/11/2008   Insomnia 03/14/2008   Overweight with body mass index (BMI) 25.0-29.9 07/03/2006   ERECTILE DYSFUNCTION 07/01/2006   Coronary atherosclerosis 07/01/2006   Irritable bowel syndrome 07/01/2006    Past Surgical History:  Procedure Laterality Date   ANKLE SURGERY  03/2002   Left ankle fracture   CARDIAC CATHETERIZATION     CHOLECYSTECTOMY  1985   COLONOSCOPY     diverticulosis, tortuous colon with looping Marva Panda)   CORONARY ARTERY BYPASS GRAFT  05/1993   x6 MI Attemped angioplasty   CORONARY STENT PLACEMENT  07/25/2009  Multi-Link VISION Cobalt chromium    CYSTOSCOPY  10/03/08   Normal-Dr. Bernardo Heater   INGUINAL HERNIA REPAIR  11/2000   and ventral hernia repair       Home Medications    Prior to Admission medications   Medication Sig Start Date End Date Taking? Authorizing Provider  Alpha-Lipoic Acid 600 MG CAPS Take 1 capsule (600 mg total) by mouth 2 (two) times daily. 06/26/16  Yes Ria Bush, MD  amoxicillin-clavulanate (AUGMENTIN) 875-125 MG tablet Take 1 tablet by mouth every 12 (twelve) hours for 7 days. 04/13/21 04/20/21 Yes Athanasios Heldman, Nicholes Stairs, NP  aspirin 81 MG tablet Take 81 mg by mouth daily.   Yes [provider]  Blood Glucose Monitoring Suppl ([place]) w/Device KIT Check blood sugar once daily 02/05/21  Yes Ria Bush, MD  Cholecalciferol (VITAMIN D3) 1000 UNITS CAPS Take 1 capsule (1,000 Units total) by mouth daily. 12/14/14  Yes Ria Bush, MD  Cranberry 500 MG CAPS Take 2 capsules (1,000 mg total) by mouth daily. 02/03/19  Yes Ria Bush, MD  diclofenac Sodium (VOLTAREN) 1 % GEL Apply 2 g topically 3 (three) times daily. 08/18/19  Yes Ria Bush, MD  DULoxetine (CYMBALTA) 20 MG capsule TAKE 1 CAPSULE BY MOUTH EVERY night 02/23/21  Yes Ria Bush, MD  finasteride (PROSCAR) 5 MG tablet TAKE 1 TABLET BY MOUTH EVERY DAY 10/04/20  Yes Ria Bush, MD  fluticasone [facility]) 50 MCG/ACT nasal spray SPRAY 2 SPRAYS INTO EACH NOSTRIL EVERY DAY 10/17/20  Yes Ria Bush, MD  furosemide (LASIX) 20 MG tablet TAKE 1 TABLET BY MOUTH EVERY DAY 10/17/20  Yes Ria Bush, MD  gabapentin (NEURONTIN) 300 MG capsule TAKE 2 CAPSULES BY MOUTH TWICE A DAY 10/18/20  Yes Ria Bush, MD  glucose blood [facility] VERIO) test strip Check blood sugar once daily 02/05/21  Yes Ria Bush, MD  hydrochlorothiazide (HYDRODIURIL) 25 MG tablet TAKE 1 TABLET BY MOUTH EVERY DAY 10/17/20  Yes Ria Bush, MD  isosorbide mononitrate (IMDUR) 30 MG 24 hr tablet Take 0.5 tablets (15 mg total) by mouth daily. 03/07/21 06/05/21 Yes Theora Gianotti, NP  losartan (COZAAR) 50 MG tablet Take 1 tablet (50 mg total) by mouth daily. 08/22/20  Yes Ria Bush, MD  metFORMIN (GLUCOPHAGE) 500 MG tablet TAKE 1 TABLET BY MOUTH 2 TIMES DAILY WITH A MEAL. 10/17/20  Yes Ria Bush, MD  OneTouch Delica Lancets 97Q MISC Check blood sugar once daily 02/05/21  Yes Ria Bush, MD  polyethylene glycol powder (GLYCOLAX/MIRALAX) 17 GM/SCOOP powder TAKE 8.5-17 G BY MOUTH DAILY  AS NEEDED FOR MODERATE CONSTIPATION. 10/18/20  Yes Ria Bush, MD  pravastatin (PRAVACHOL) 40 MG tablet TAKE 1 TABLET BY MOUTH EVERYDAY AT BEDTIME 10/17/20  Yes Ria Bush, MD  traZODone (DESYREL) 50 MG tablet TAKE 1 tablet BY MOUTH AT BEDTIME AS NEEDED FOR SLEEP 02/23/21  Yes Ria Bush, MD  vitamin B-12 (CYANOCOBALAMIN) 250 MCG tablet Take 250 mcg by mouth once a week.    Yes [provider]  valACYclovir (VALTREX) 1000 MG tablet Take 2 tablets (2,000 mg total) by mouth 2 (two) times daily. For 1 day 02/23/21   Ria Bush, MD    Family History Family History  Problem Relation Age of Onset   Kidney failure Mother    Coronary artery disease Mother    Heart failure Mother    Hypertension Brother    Heart attack Brother    Alcohol abuse Brother    Hypertension Brother  Hypertension Sister     Social History Social History   Tobacco Use   Smoking status: Never   Smokeless tobacco: Never  Vaping Use   Vaping Use: Never used  Substance Use Topics   Alcohol use: No    Alcohol/week: 0.0 standard drinks   Drug use: No     Allergies   Morphine sulfate, Nitroglycerin, and Contrast media [iodinated contrast media]   Review of Systems Review of Systems  Constitutional:  Positive for fatigue. Negative for activity change, appetite change, chills and fever.  HENT:  Positive for congestion, ear pain (right), postnasal drip, sinus pressure (right), sinus pain (right) and sore throat. Negative for ear discharge, facial swelling, mouth sores, nosebleeds and trouble swallowing.   Eyes:  Negative for pain, discharge, redness and itching.  Respiratory:  Positive for cough. Negative for chest tightness, shortness of breath and wheezing.   Gastrointestinal:  Negative for diarrhea, nausea and vomiting.  Musculoskeletal:  Negative for arthralgias, myalgias, neck pain and neck stiffness.  Skin:  Negative for color change and rash.  Allergic/Immunologic: Positive  for environmental allergies. Negative for food allergies.  Neurological:  Positive for headaches. Negative for seizures, syncope, facial asymmetry, speech difficulty and light-headedness.  Hematological:  Negative for adenopathy. Does not bruise/bleed easily.  Psychiatric/Behavioral:  Positive for sleep disturbance.     Physical Exam Triage Vital Signs ED Triage Vitals  Enc Vitals Group     BP 04/13/21 0957 135/68     Pulse Rate 04/13/21 0957 (!) 118     Resp 04/13/21 0957 (!) 22     Temp 04/13/21 0957 98.2 F (36.8 C)     Temp Source 04/13/21 0957 Oral     SpO2 04/13/21 0957 97 %     Weight 04/13/21 0955 178 lb (80.7 kg)     Height 04/13/21 0955 $RemoveBefor'5\' 7"'kiqdVHRBVzhM$  (1.702 m)     Head Circumference --      Peak Flow --      Pain Score 04/13/21 0954 0     Pain Loc --      Pain Edu? --      Excl. in [place]? --    No data found.  Updated Vital Signs BP 135/68 (BP Location: Left Arm)    Pulse (!) 108    Temp 98.2 F (36.8 C) (Oral)    Resp (!) 22    Ht $R'5\' 7"'AJ$  (1.702 m)    Wt 178 lb (80.7 kg)    SpO2 97%    BMI 27.88 kg/m   Visual Acuity Right Eye Distance:   Left Eye Distance:   Bilateral Distance:    Right Eye Near:   Left Eye Near:    Bilateral Near:     Physical Exam Vitals and nursing note reviewed.  Constitutional:      General: He is awake. He is not in acute distress.    Appearance: He is well-developed and well-groomed. He is ill-appearing.     Comments: He is sitting on the exam table in no acute distress but appears tired and ill.   HENT:     Head: Normocephalic and atraumatic.     Right Ear: Hearing, tympanic membrane, ear canal and external ear normal.     Left Ear: Hearing, tympanic membrane, ear canal and external ear normal.     Nose: Congestion present.     Right Sinus: Maxillary sinus tenderness and frontal sinus tenderness present.     Left Sinus: No maxillary sinus tenderness or frontal  sinus tenderness.     Mouth/Throat:     Lips: Pink.     Mouth: Mucous  membranes are moist.     Pharynx: Uvula midline. Oropharyngeal exudate and posterior oropharyngeal erythema present. No pharyngeal swelling or uvula swelling.     Comments: Yellow post nasal drainage present Eyes:     Extraocular Movements: Extraocular movements intact.     Conjunctiva/sclera: Conjunctivae normal.  Cardiovascular:     Rate and Rhythm: Tachycardia present. Rhythm irregular.  Pulmonary:     Effort: Pulmonary effort is normal. No respiratory distress.     Breath sounds: Normal breath sounds and air entry. No decreased air movement. No decreased breath sounds, wheezing, rhonchi or rales.     Comments: Respiratory rate = 18 during exam.  Musculoskeletal:     Cervical back: Normal range of motion and neck supple.  Lymphadenopathy:     Cervical: No cervical adenopathy.  Skin:    General: Skin is warm and dry.     Capillary Refill: Capillary refill takes less than 2 seconds.     Findings: No rash.  Neurological:     General: No focal deficit present.     Mental Status: He is alert and oriented to person, place, and time.  Psychiatric:        Mood and Affect: Mood normal.        Behavior: Behavior normal. Behavior is cooperative.        Thought Content: Thought content normal.        Judgment: Judgment normal.     UC Treatments / Results  Labs (all labs ordered are listed, but only abnormal results are displayed) Labs Reviewed - No data to display  EKG   Radiology No results found.  Procedures Procedures (including critical care time)  Medications Ordered in UC Medications - No data to display  Initial Impression / Assessment and Plan / UC Course  I have reviewed the triage vital signs and the nursing notes.  Pertinent labs & imaging results that were available during my care of the patient were reviewed by me and considered in my medical decision making (see chart for details).     Reviewed with patient that he appears to have a right sided sinus  infection. Since symptoms are worsening, will treat for possible bacterial etiology. Recommend start Augmentin 875mg  twice a day as directed. Continue to push fluids to help loosen up mucus in sinuses and chest. May continue Flonase daily as directed. May use OTC Afrin nasal spray- mainly in the right nostril, every 8 hours as needed- do not use for more than 3 days. Follow-up with his PCP in 4 to 5 days if not improving.    Final Clinical Impressions(s) / UC Diagnoses   Final diagnoses:  Acute non-recurrent pansinusitis  Post-nasal drainage     Discharge Instructions      Recommend start Augmentin 875mg  twice a day for 7 days. Continue to push fluids to help loosen up mucus in sinuses and chest. May use OTC Afrin nasal spray 2 sprays especially in the right nostril every 8 hours as needed for congestion- use for only 3 days. Continue Flonase as directed. Follow-up with your PCP in 4 to 5 days if not improving.     ED Prescriptions     Medication Sig Dispense Auth. Provider   amoxicillin-clavulanate (AUGMENTIN) 875-125 MG tablet Take 1 tablet by mouth every 12 (twelve) hours for 7 days. 14 tablet Katy Apo, NP  PDMP not reviewed this encounter.   Katy Apo, NP 04/13/21 1329

## 2021-04-13 NOTE — ED Triage Notes (Signed)
Patient is here for "st & congestion". "Sinus infection with congestion, sinus pressure for about 5 days". Causing a little "s/t" with "Cough". No ha. No sob. No fever.

## 2021-04-13 NOTE — Discharge Instructions (Addendum)
Recommend start Augmentin 875mg  twice a day for 7 days. Continue to push fluids to help loosen up mucus in sinuses and chest. May use OTC Afrin nasal spray 2 sprays especially in the right nostril every 8 hours as needed for congestion- use for only 3 days. Continue Flonase as directed. Follow-up with your PCP in 4 to 5 days if not improving.

## 2021-04-26 ENCOUNTER — Telehealth: Payer: Self-pay

## 2021-04-26 NOTE — Progress Notes (Signed)
?  Transition CCM to Self Care ? ?Patient contacted to inform they have achieved their CCM goals and no longer need to be contacted as frequently. Patient advised services will still be available to them if they would like to reach out or have any new health concerns. Verified patient had contact information to pharmacist and health concierge on hand. Patient made aware CCM services would be continued if desired. Patient consented to cancel future CCM appointments. ? ?Charlene Brooke, CPP notified ? ?Marijean Niemann, RMA ?Clinical Pharmacy Assistant ?[phone] ?

## 2021-05-22 ENCOUNTER — Telehealth: Payer: Medicare Other

## 2021-05-25 ENCOUNTER — Other Ambulatory Visit: Payer: Self-pay | Admitting: Family Medicine

## 2021-06-12 ENCOUNTER — Observation Stay
Admission: EM | Admit: 2021-06-12 | Discharge: 2021-06-13 | Disposition: A | Discharge status: Home / Self Care | Payer: Medicare Other | Attending: Osteopathic Medicine | Admitting: Osteopathic Medicine

## 2021-06-12 ENCOUNTER — Observation Stay: Payer: Medicare Other

## 2021-06-12 ENCOUNTER — Other Ambulatory Visit: Payer: Self-pay

## 2021-06-12 ENCOUNTER — Emergency Department: Payer: Medicare Other

## 2021-06-12 ENCOUNTER — Observation Stay (HOSPITAL_BASED_OUTPATIENT_CLINIC_OR_DEPARTMENT_OTHER)
Admit: 2021-06-12 | Discharge: 2021-06-12 | Disposition: A | Discharge status: Home / Self Care | Payer: Medicare Other | Attending: Internal Medicine | Admitting: Internal Medicine

## 2021-06-12 DIAGNOSIS — W1839XA Other fall on same level, initial encounter: Secondary | ICD-10-CM | POA: Diagnosis not present

## 2021-06-12 DIAGNOSIS — N401 Enlarged prostate with lower urinary tract symptoms: Secondary | ICD-10-CM

## 2021-06-12 DIAGNOSIS — Z8616 Personal history of COVID-19: Secondary | ICD-10-CM | POA: Diagnosis not present

## 2021-06-12 DIAGNOSIS — Y92009 Unspecified place in unspecified non-institutional (private) residence as the place of occurrence of the external cause: Secondary | ICD-10-CM | POA: Diagnosis not present

## 2021-06-12 DIAGNOSIS — G459 Transient cerebral ischemic attack, unspecified: Secondary | ICD-10-CM | POA: Diagnosis not present

## 2021-06-12 DIAGNOSIS — Z7902 Long term (current) use of antithrombotics/antiplatelets: Secondary | ICD-10-CM | POA: Diagnosis not present

## 2021-06-12 DIAGNOSIS — E1140 Type 2 diabetes mellitus with diabetic neuropathy, unspecified: Secondary | ICD-10-CM | POA: Diagnosis not present

## 2021-06-12 DIAGNOSIS — I2510 Atherosclerotic heart disease of native coronary artery without angina pectoris: Secondary | ICD-10-CM | POA: Diagnosis present

## 2021-06-12 DIAGNOSIS — N138 Other obstructive and reflux uropathy: Secondary | ICD-10-CM | POA: Diagnosis present

## 2021-06-12 DIAGNOSIS — E785 Hyperlipidemia, unspecified: Secondary | ICD-10-CM | POA: Diagnosis not present

## 2021-06-12 DIAGNOSIS — Z955 Presence of coronary angioplasty implant and graft: Secondary | ICD-10-CM | POA: Diagnosis not present

## 2021-06-12 DIAGNOSIS — R001 Bradycardia, unspecified: Secondary | ICD-10-CM | POA: Diagnosis not present

## 2021-06-12 DIAGNOSIS — R000 Tachycardia, unspecified: Secondary | ICD-10-CM | POA: Diagnosis not present

## 2021-06-12 DIAGNOSIS — I63542 Cerebral infarction due to unspecified occlusion or stenosis of left cerebellar artery: Secondary | ICD-10-CM | POA: Diagnosis not present

## 2021-06-12 DIAGNOSIS — R296 Repeated falls: Secondary | ICD-10-CM | POA: Diagnosis present

## 2021-06-12 DIAGNOSIS — Z79899 Other long term (current) drug therapy: Secondary | ICD-10-CM | POA: Diagnosis not present

## 2021-06-12 DIAGNOSIS — I110 Hypertensive heart disease with heart failure: Secondary | ICD-10-CM | POA: Diagnosis not present

## 2021-06-12 DIAGNOSIS — Z7982 Long term (current) use of aspirin: Secondary | ICD-10-CM | POA: Insufficient documentation

## 2021-06-12 DIAGNOSIS — I6389 Other cerebral infarction: Secondary | ICD-10-CM

## 2021-06-12 DIAGNOSIS — R4701 Aphasia: Secondary | ICD-10-CM

## 2021-06-12 DIAGNOSIS — Z7984 Long term (current) use of oral hypoglycemic drugs: Secondary | ICD-10-CM | POA: Diagnosis not present

## 2021-06-12 DIAGNOSIS — Z951 Presence of aortocoronary bypass graft: Secondary | ICD-10-CM | POA: Insufficient documentation

## 2021-06-12 DIAGNOSIS — Z7901 Long term (current) use of anticoagulants: Secondary | ICD-10-CM | POA: Insufficient documentation

## 2021-06-12 DIAGNOSIS — W19XXXA Unspecified fall, initial encounter: Secondary | ICD-10-CM | POA: Diagnosis not present

## 2021-06-12 DIAGNOSIS — I639 Cerebral infarction, unspecified: Secondary | ICD-10-CM | POA: Diagnosis present

## 2021-06-12 DIAGNOSIS — I10 Essential (primary) hypertension: Secondary | ICD-10-CM | POA: Diagnosis present

## 2021-06-12 DIAGNOSIS — I5032 Chronic diastolic (congestive) heart failure: Secondary | ICD-10-CM | POA: Insufficient documentation

## 2021-06-12 DIAGNOSIS — I63212 Cerebral infarction due to unspecified occlusion or stenosis of left vertebral arteries: Secondary | ICD-10-CM | POA: Diagnosis not present

## 2021-06-12 DIAGNOSIS — Z85820 Personal history of malignant melanoma of skin: Secondary | ICD-10-CM | POA: Diagnosis not present

## 2021-06-12 DIAGNOSIS — R531 Weakness: Secondary | ICD-10-CM | POA: Diagnosis not present

## 2021-06-12 DIAGNOSIS — Z8673 Personal history of transient ischemic attack (TIA), and cerebral infarction without residual deficits: Secondary | ICD-10-CM | POA: Diagnosis present

## 2021-06-12 LAB — BASIC METABOLIC PANEL
Anion gap: 7 (ref 5–15)
BUN: 28 mg/dL — ABNORMAL HIGH (ref 8–23)
CO2: 25 mmol/L (ref 22–32)
Calcium: 8.4 mg/dL — ABNORMAL LOW (ref 8.9–10.3)
Chloride: 104 mmol/L (ref 98–111)
Creatinine, Ser: 1.05 mg/dL (ref 0.61–1.24)
GFR, Estimated: >60 mL/min (ref >60)
Glucose, Bld: 153 mg/dL — ABNORMAL HIGH (ref 70–99)
Potassium: 4.2 mmol/L (ref 3.5–5.1)
Sodium: 136 mmol/L (ref 135–145)

## 2021-06-12 LAB — CBC
HCT: 44.2 % (ref 39.0–52.0)
Hemoglobin: 14.6 g/dL (ref 13.0–17.0)
MCH: 30.5 pg (ref 26.0–34.0)
MCHC: 33 g/dL (ref 30.0–36.0)
MCV: 92.3 fL (ref 80.0–100.0)
Platelets: 169 10*3/uL (ref 150–400)
RBC: 4.79 MIL/uL (ref 4.22–5.81)
RDW: 12.9 % (ref 11.5–15.5)
WBC: 6.8 10*3/uL (ref 4.0–10.5)
nRBC: 0 % (ref 0.0–0.2)

## 2021-06-12 LAB — ECHOCARDIOGRAM COMPLETE
AR max vel: 2.76 cm2
AV Area VTI: 2.6 cm2
AV Area mean vel: 2.77 cm2
AV Mean grad: 4 mmHg
AV Peak grad: 6.9 mmHg
Ao pk vel: 1.31 m/s
Area-P 1/2: 3.95 cm2
Height: 68 in
MV VTI: 1.72 cm2
S' Lateral: 3.78 cm
Weight: 2864 oz

## 2021-06-12 LAB — CBG MONITORING, ED
Glucose-Capillary: 173 mg/dL — ABNORMAL HIGH (ref 70–99)
Glucose-Capillary: 167 mg/dL — ABNORMAL HIGH (ref 70–99)

## 2021-06-12 LAB — GLUCOSE, CAPILLARY
Glucose-Capillary: 110 mg/dL — ABNORMAL HIGH (ref 70–99)
Glucose-Capillary: 134 mg/dL — ABNORMAL HIGH (ref 70–99)

## 2021-06-12 LAB — BRAIN NATRIURETIC PEPTIDE: B Natriuretic Peptide: 88.3 pg/mL (ref 0.0–100.0)

## 2021-06-12 IMAGING — CT CT HEAD W/O CM
4 series · 16 of 47 positions shown, 18 images · non-contrast
Comparison: None.

CLINICAL DATA: Altered speech, bilateral leg weakness



[Series 2: head bone · axial · 0.41mm/px · z∈[-89,-59]mm · 3 of 75 slices shown]
[im 8/75  bone]
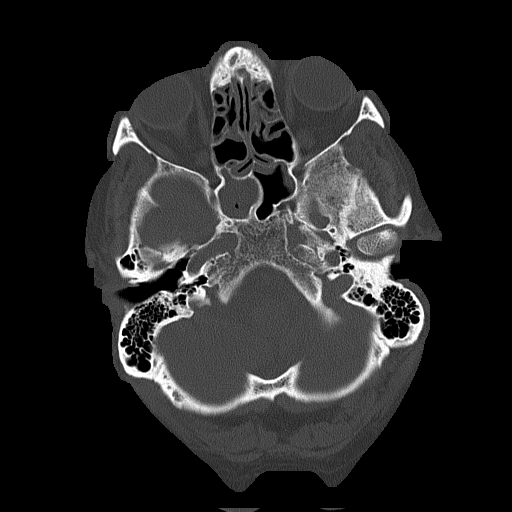
[im 15/75  bone]
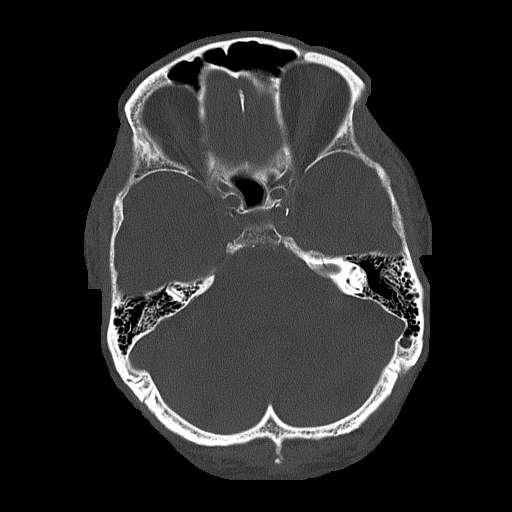
[im 23/75  bone]
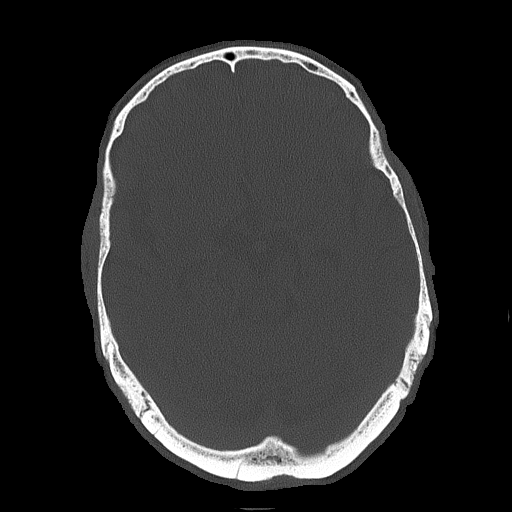

[Series 3: head wo · axial · 0.41mm/px · z∈[-88,+22]mm · 7 of 30 slices shown, 9 images]
[im 4/30  brain]
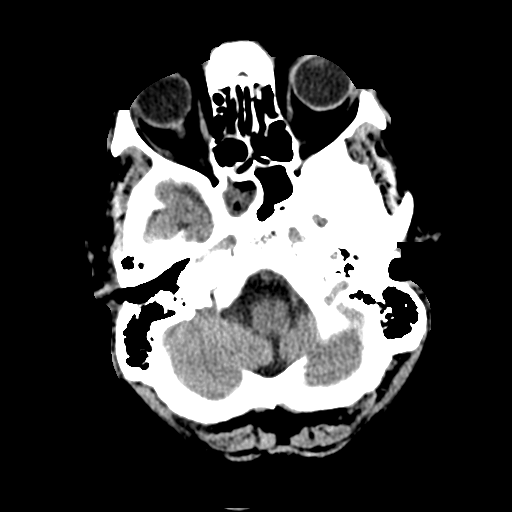
[im 4/30  bone]
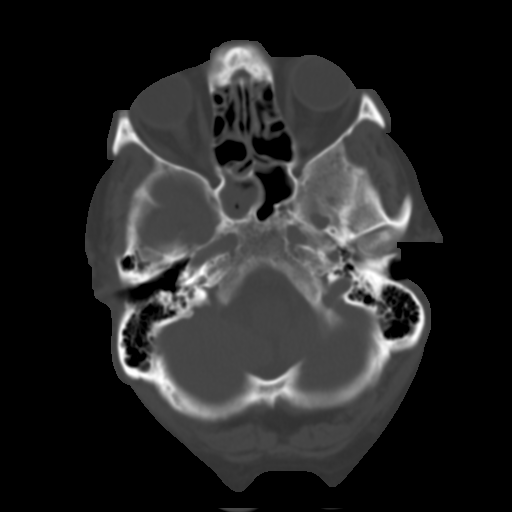
[im 8/30  brain]
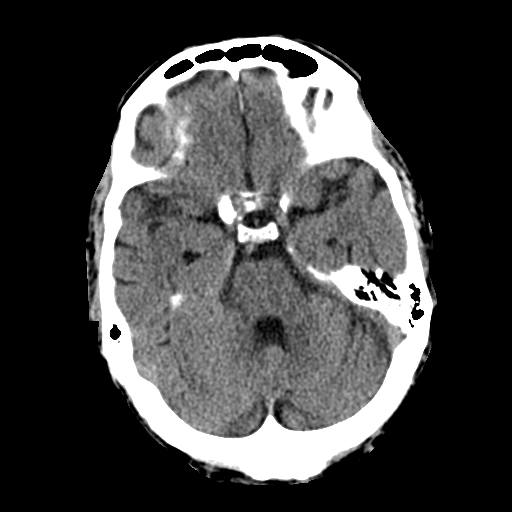
[im 11/30  brain]
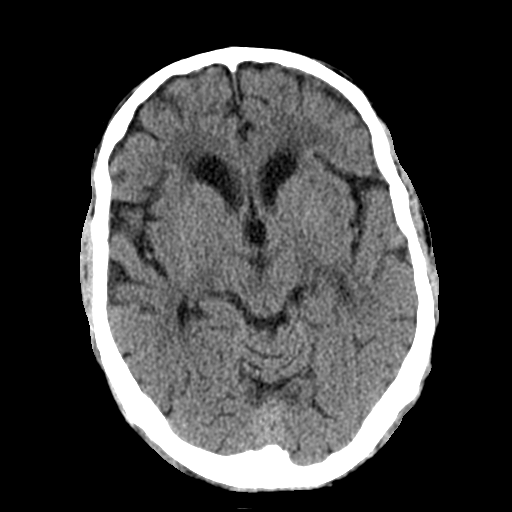
[im 15/30  brain]
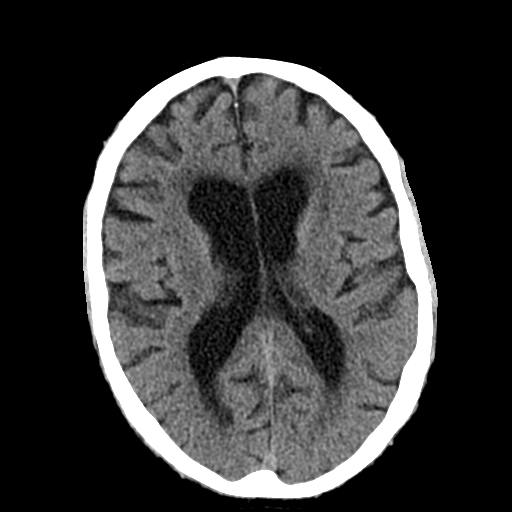
[im 19/30  brain]
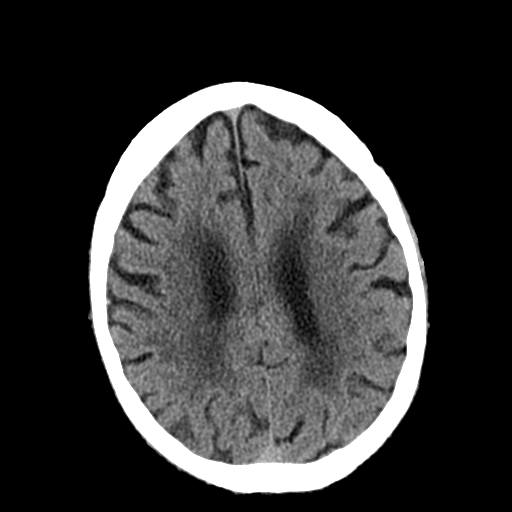
[im 19/30  bone]
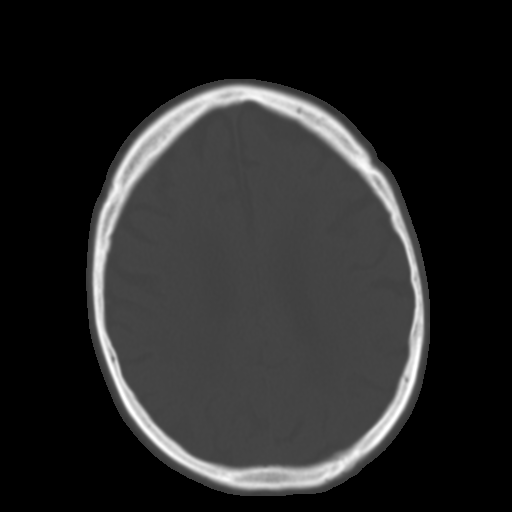
[im 22/30  brain]
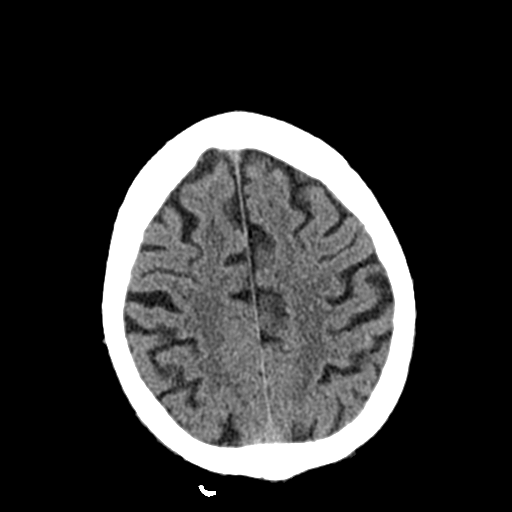
[im 26/30  brain]
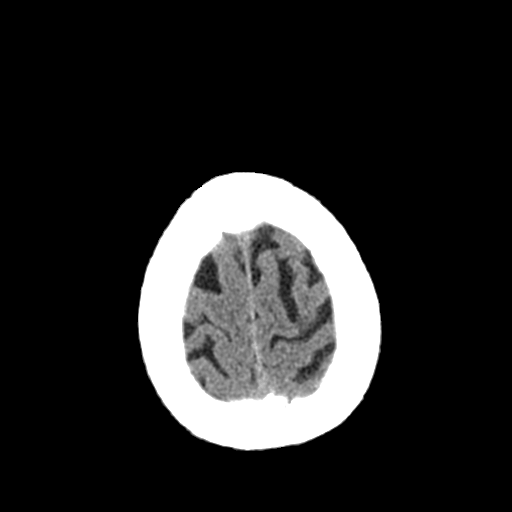

[Series 4: coronal soft tissue · coronal · 0.31mm/px · 3 of 66 slices shown]
[im 22/66  brain]
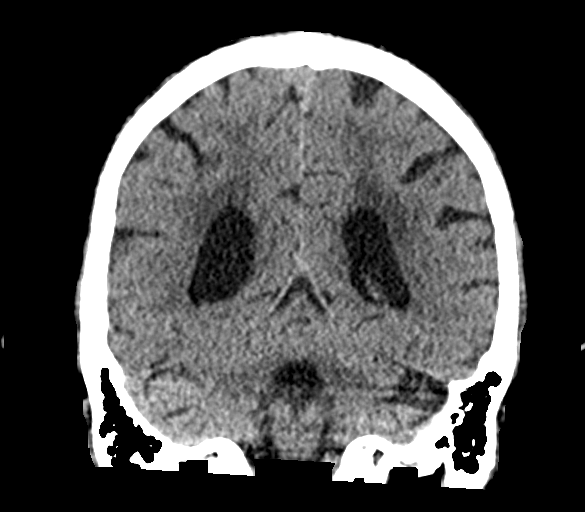
[im 29/66  brain]
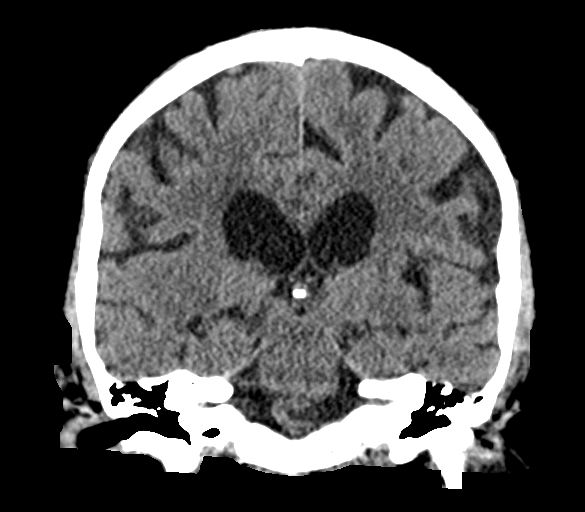
[im 37/66  brain]
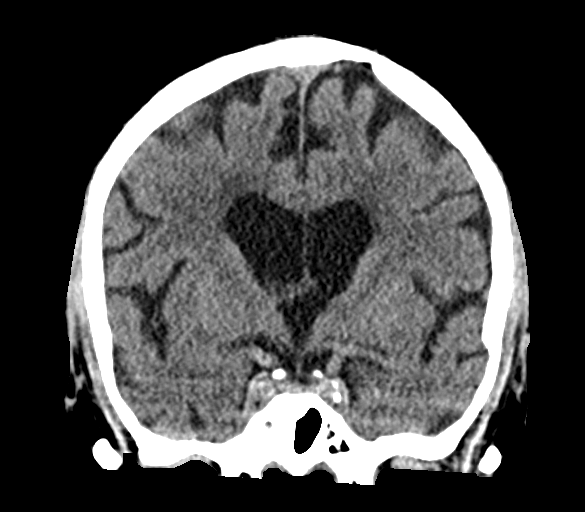

[Series 5: sagittal soft tissue · sagittal · 0.31mm/px · 3 of 58 slices shown]
[im 20/58  brain]
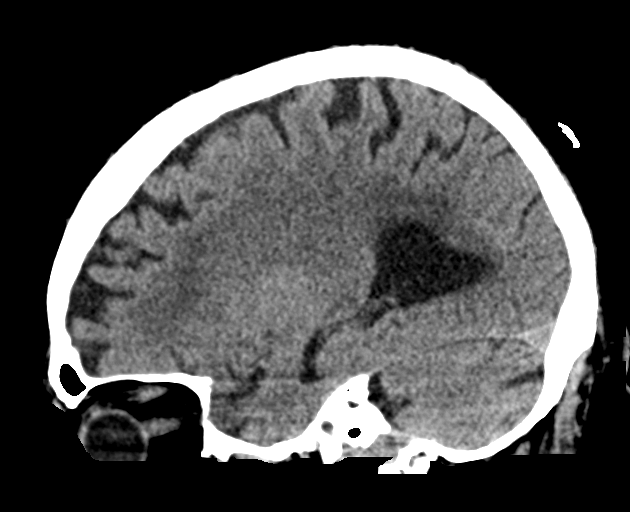
[im 29/58  brain]
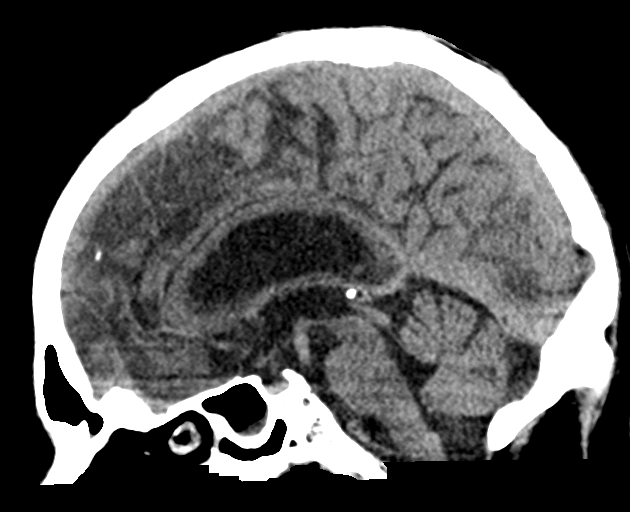
[im 39/58  brain]
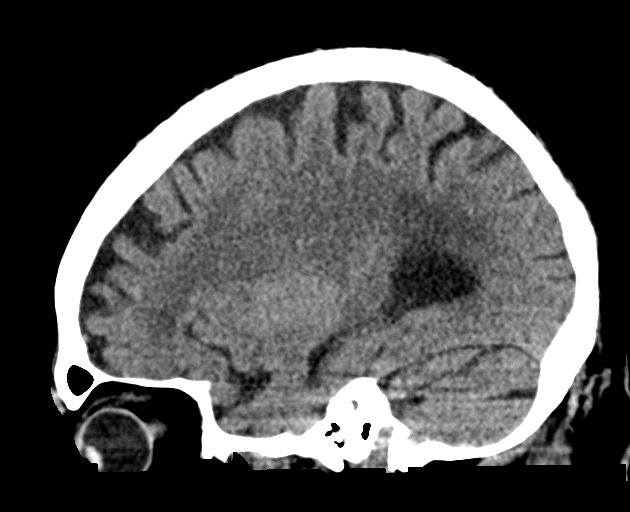

[16 of 47 positions shown; findings below may reference images not displayed]

FINDINGS: Brain: There is no evidence of acute intracranial hemorrhage,
extra-axial fluid collection, or acute infarct.

There is mild global parenchymal volume loss with prominence of the
ventricular system and extra-axial CSF spaces. Patchy hypodensity in
the subcortical and periventricular white matter likely reflects
sequela of chronic white matter microangiopathy.

There is no mass lesion.  There is no mass effect or midline shift.

Vascular: No hyperdense vessel or unexpected calcification.

Skull: Normal. Negative for fracture or focal lesion.

Sinuses/Orbits: There is mucosal thickening in the right sphenoid
sinus. Globes and orbits are unremarkable.

Other: None.
IMPRESSION: No acute intracranial pathology.

## 2021-06-12 IMAGING — MR MR HEAD W/O CM
12 series · 42 of 48 positions shown · IV contrast (gadavist)
Comparison: CT head [DATE]

CLINICAL DATA: TIA

EXAM:
MRI HEAD WITHOUT CONTRAST
MRA HEAD WITHOUT CONTRAST
MRA OF THE NECK WITHOUT AND WITH CONTRAST
TECHNIQUE: Multiplanar, multi-echo pulse sequences of the brain and surrounding
structures were acquired without intravenous contrast. Angiographic
images of the Circle of Willis were acquired using MRA technique
without intravenous contrast. Angiographic images of the neck were
acquired using MRA technique without and with intravenous contrast.
Carotid stenosis measurements (when applicable) are obtained
utilizing NASCET criteria, using the distal internal carotid
diameter as the denominator.
CONTRAST:  8mL GADAVIST GADOBUTROL 1 MMOL/ML IV SOLN

[Series 5: ax dwi_tracew · axial · 3.0mm · 0.65mm/px · z∈[-50,+98]mm · 4 of 48 slices shown]
[im 1/48]
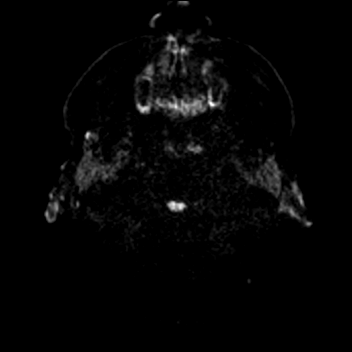
[im 16/48]
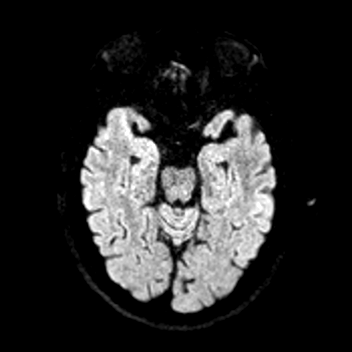
[im 32/48]
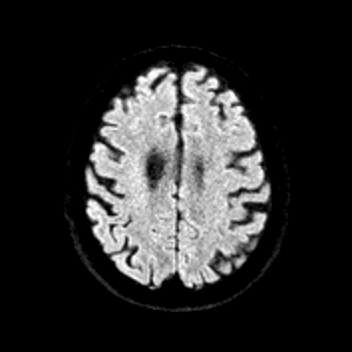
[im 48/48]
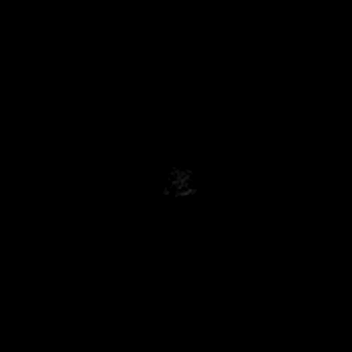

[Series 6: ax dwi_adc · axial · 3.0mm · 0.65mm/px · z∈[-50,+98]mm · 3 of 48 slices shown]
[im 1/48]
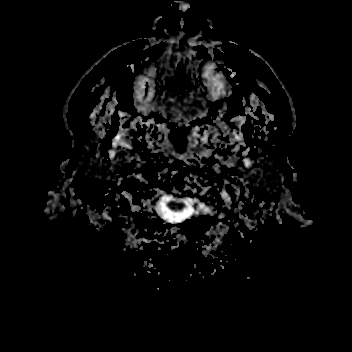
[im 24/48]
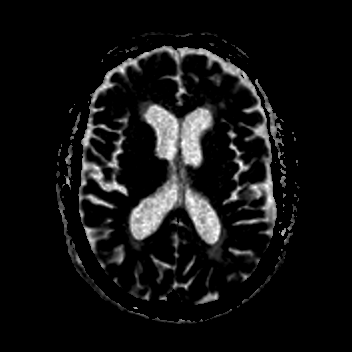
[im 48/48]
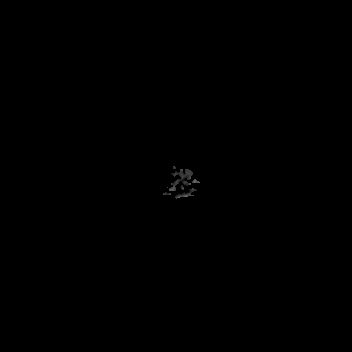

[Series 7: cor dwi_tracew · coronal · 5.0mm · 0.65mm/px · 3 of 40 slices shown]
[im 1/40]
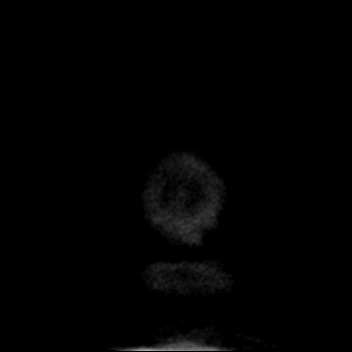
[im 20/40]
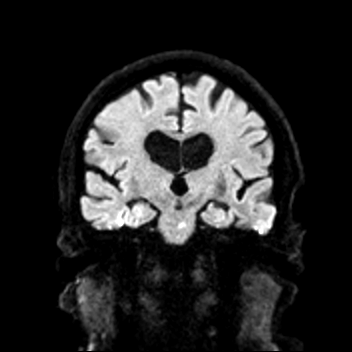
[im 40/40]
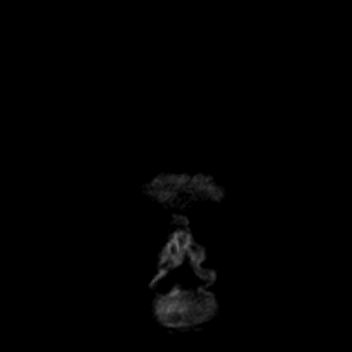

[Series 8: cor dwi_adc · coronal · 5.0mm · 0.65mm/px · 3 of 40 slices shown]
[im 1/40]
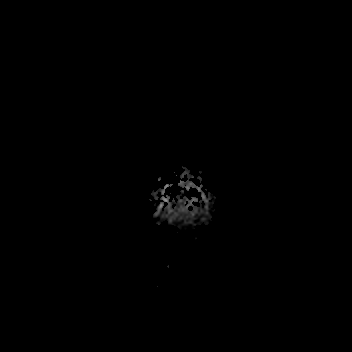
[im 20/40]
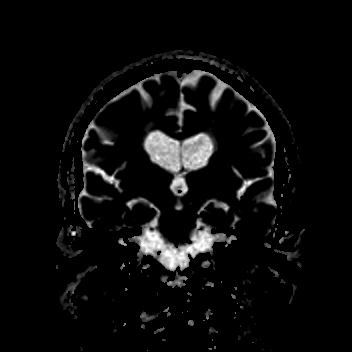
[im 40/40]
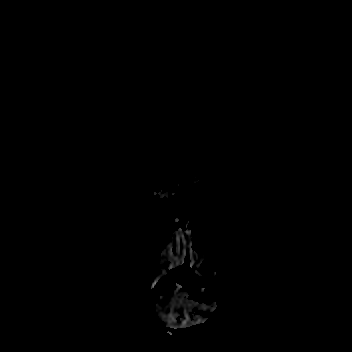

[Series 9: T1 · sagittal · 5.0mm · 0.62mm/px · 2 of 25 slices shown (1 of 2)]
[im 1/25]
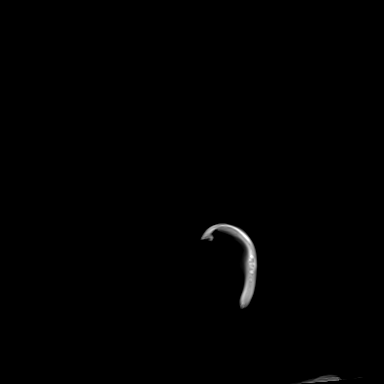
[im 25/25]
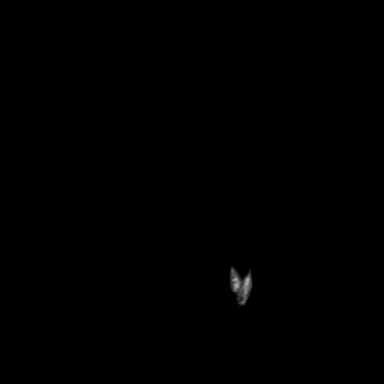

[Series 10: T2 · axial · 5.0mm · 0.53mm/px · z∈[-46,+92]mm · 2 of 25 slices shown (1 of 2)]
[im 1/25]
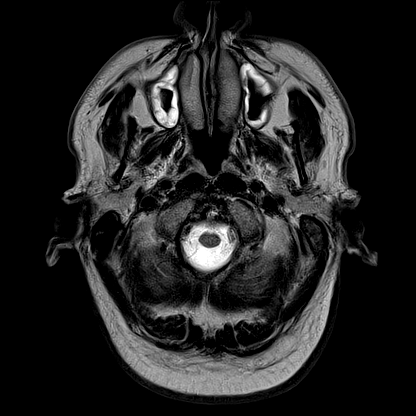
[im 25/25]
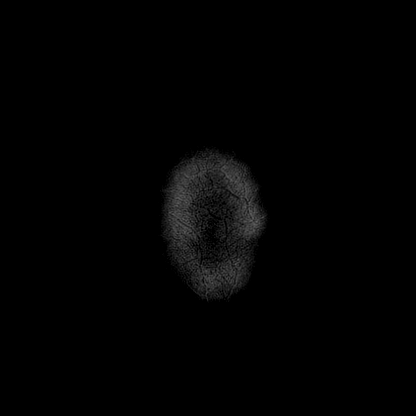

[Series 11: mag_images · axial · 3.0mm · 0.90mm/px · z∈[-61,+109]mm · 4 of 60 slices shown]
[im 1/60]
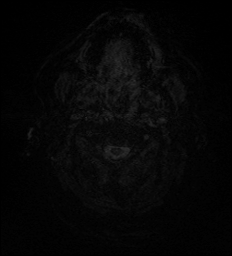
[im 20/60]
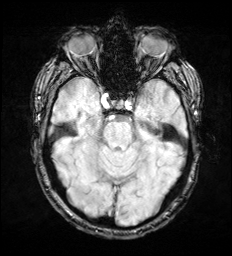
[im 40/60]
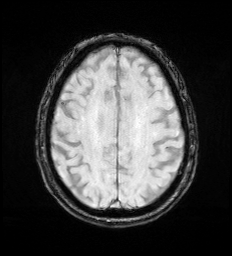
[im 60/60]
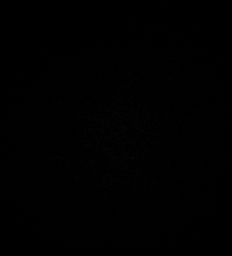

[Series 12: pha_images · axial · 3.0mm · 0.90mm/px · z∈[-61,+109]mm · 4 of 59 slices shown]
[im 1/59]
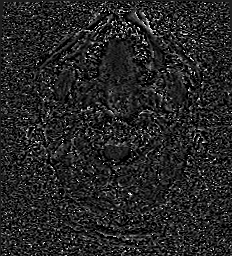
[im 20/59]
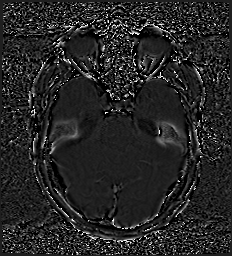
[im 39/59]
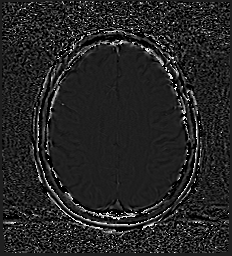
[im 59/59]
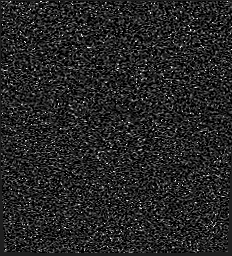

[Series 13: swi_images · axial · 3.0mm · 0.90mm/px · z∈[-61,+51]mm · 3 of 60 slices shown]
[im 1/60]
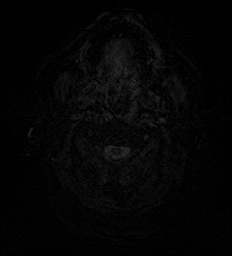
[im 20/60]
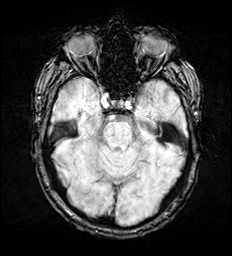
[im 40/60]
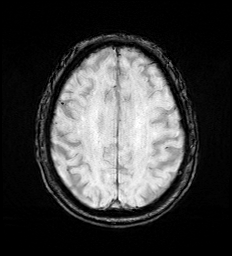

[Series 15: FLAIR · axial · 3.0mm · 0.53mm/px · z∈[-55,+100]mm · 4 of 55 slices shown]
[im 1/55]
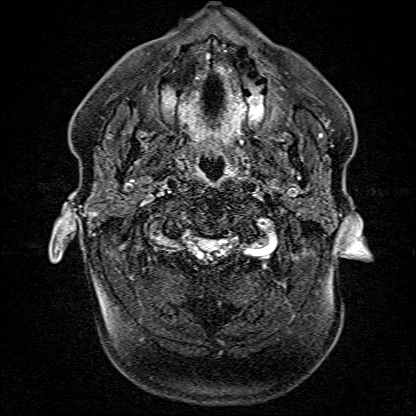
[im 19/55]
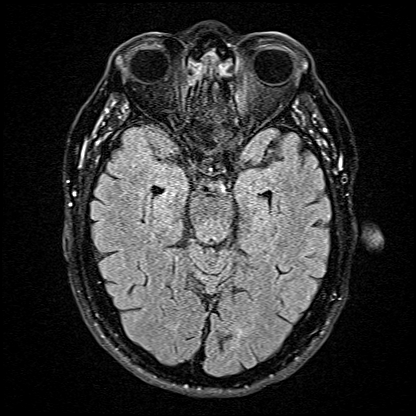
[im 37/55]
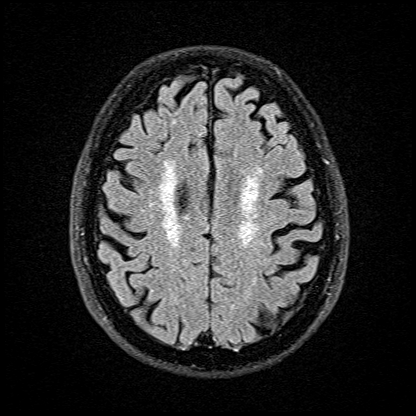
[im 55/55]
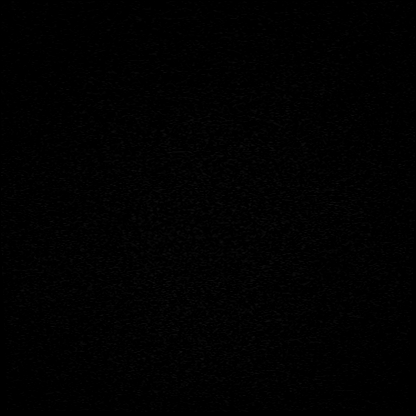

[Series 16: T1 · axial · 1.0mm · 0.98mm/px · z∈[-56,+111]mm · 8 of 176 slices shown (2 of 2)]
[im 1/176]
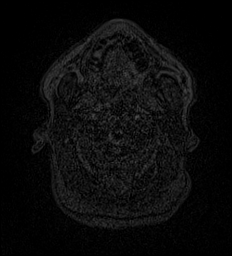
[im 30/176]
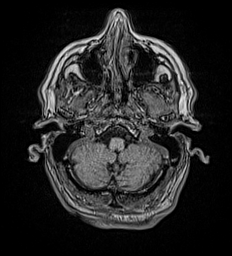
[im 59/176]
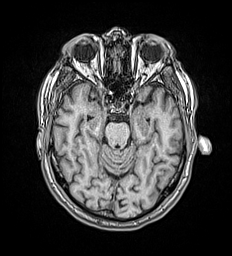
[im 73/176]
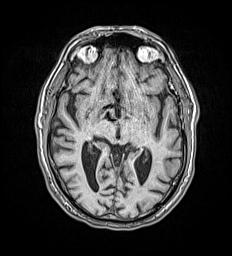
[im 103/176]
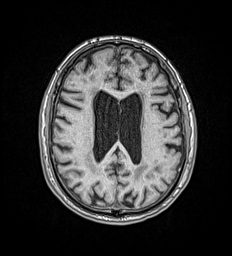
[im 117/176]
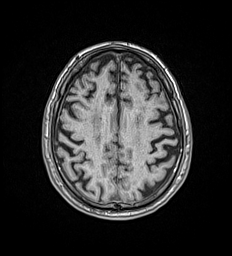
[im 146/176]
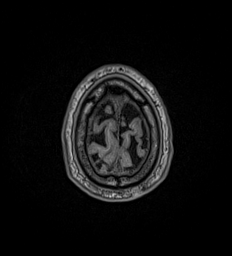
[im 176/176]
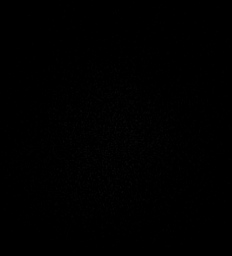

[Series 17: T2 · coronal · 5.0mm · 0.57mm/px · 2 of 29 slices shown (2 of 2)]
[im 1/29]
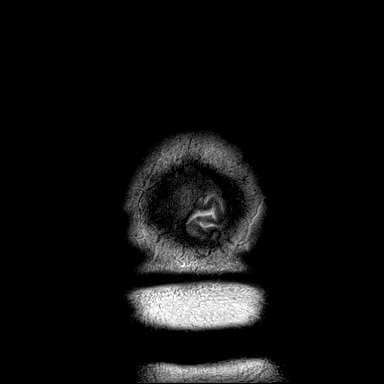
[im 29/29]
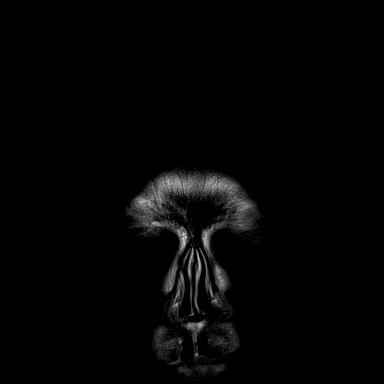

[42 of 48 positions shown; findings below may reference images not displayed]

FINDINGS: MR HEAD FINDINGS

Brain: 5 mm area of restricted diffusion left cerebellar tonsil
consistent with acute infarct. No other acute infarct identified

Mild to moderate white matter changes with scattered periventricular
deep white matter hyperintensity. Mild cerebral atrophy. Negative
for hemorrhage or mass or fluid collection.

Vascular: Normal arterial flow voids at the skull base.

Skull and upper cervical spine: Negative

Sinuses/Orbits: Moderate mucosal edema paranasal sinuses. Mastoid
clear. Negative orbit

Other: None

MRA HEAD FINDINGS

Anterior circulation: Internal carotid artery patent without
stenosis. Anterior and middle cerebral arteries patent without
stenosis or large vessel occlusion. No aneurysm.

Posterior circulation: Severe stenosis distal left vertebral artery
with irregularity. There is decreased flow related signal in the
left internal carotid artery likely due to occlusion of the proximal
left vertebral artery as identified on the MRA neck described
below. Left PICA not visualized

Right vertebral artery mildly stenotic. Right PICA patent. Basilar
patent. Posterior cerebral arteries patent without stenosis or
aneurysm.

Anatomic variants:None

MRA NECK FINDINGS

Aortic arch: Normal aortic arch. Proximal great vessels widely
patent.

Right carotid system: Mild atherosclerotic disease right carotid
bifurcation. Right internal carotid artery widely patent. Mild
stenosis origin of right external carotid artery.

Left carotid system: Left carotid widely patent. Left bifurcation
scratch the left carotid bifurcation widely patent.

Vertebral arteries: Proximal left vertebral artery is occluded with
reconstitution in the mid cervical spine. Severe stenosis distal
left vertebral artery just before the basilar. Left PICA not
visualized.

Mild stenosis origin of right vertebral artery. Mild stenosis distal
right vertebral artery. Right PICA patent.

Other: None
IMPRESSION: 1. Small acute infarct left cerebellar tonsil. The left vertebral
artery is occluded at the origin. There is a severe stenosis in the
left V4 segment. Left PICA not visualized.
2. Mild stenosis proximal and distal right vertebral artery.  But
3. Both carotid arteries are patent to the skull base without
significant stenosis.
4. Atrophy and mild to moderate chronic microvascular ischemia.

## 2021-06-12 IMAGING — MR MR MRA HEAD W/O CM
1 series · 25 of 48 positions shown · IV contrast (gadavist)
Comparison: CT head [DATE]

CLINICAL DATA: TIA

EXAM:
MRI HEAD WITHOUT CONTRAST
MRA HEAD WITHOUT CONTRAST
MRA OF THE NECK WITHOUT AND WITH CONTRAST
TECHNIQUE: Multiplanar, multi-echo pulse sequences of the brain and surrounding
structures were acquired without intravenous contrast. Angiographic
images of the Circle of Willis were acquired using MRA technique
without intravenous contrast. Angiographic images of the neck were
acquired using MRA technique without and with intravenous contrast.
Carotid stenosis measurements (when applicable) are obtained
utilizing NASCET criteria, using the distal internal carotid
diameter as the denominator.
CONTRAST:  8mL GADAVIST GADOBUTROL 1 MMOL/ML IV SOLN

[Series 5: TOF · axial · 0.5mm · 0.48mm/px · z∈[-49,+42]mm · 25 of 217 slices shown]
[im 1/217]
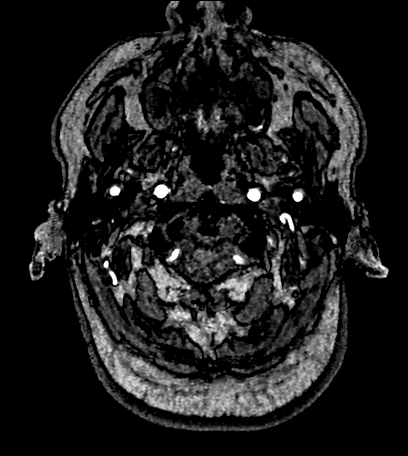
[im 5/217]
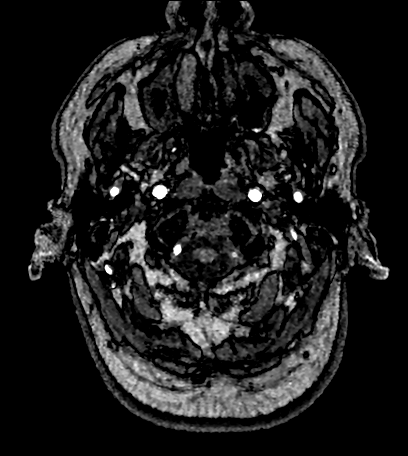
[im 10/217]
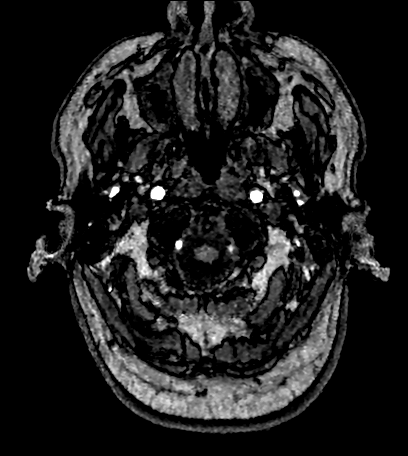
[im 14/217]
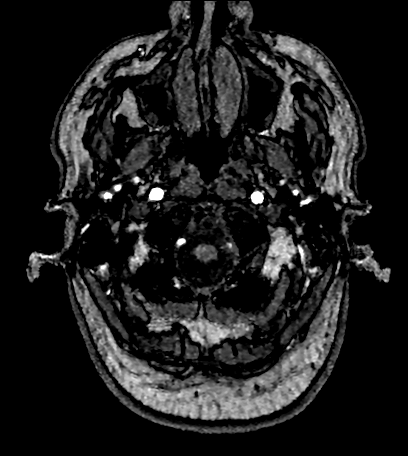
[im 19/217]
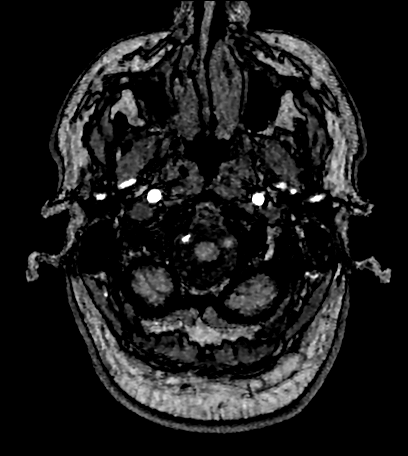
[im 23/217]
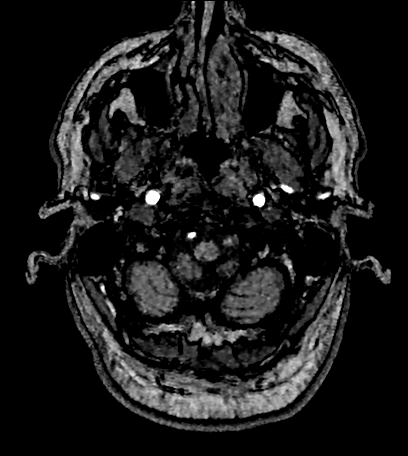
[im 28/217]
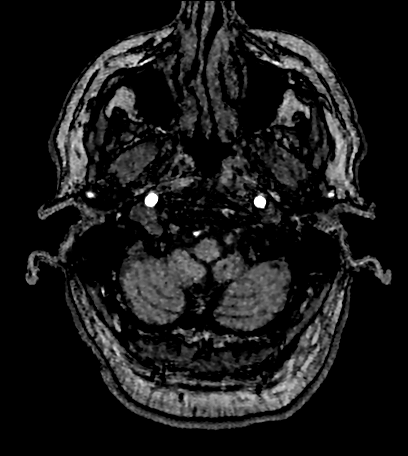
[im 33/217]
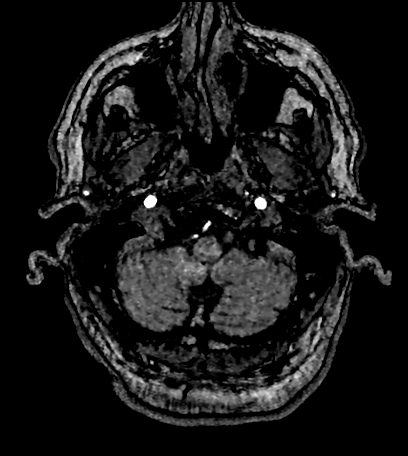
[im 37/217]
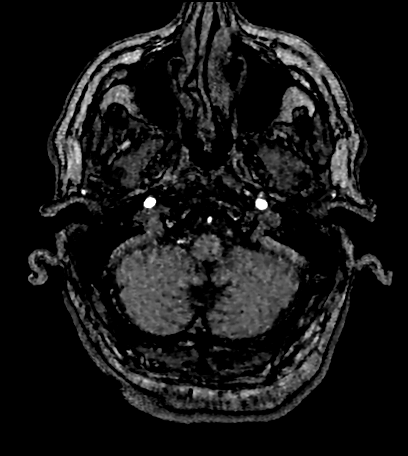
[im 42/217]
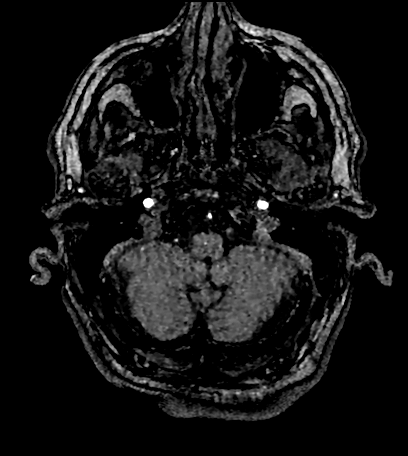
[im 46/217]
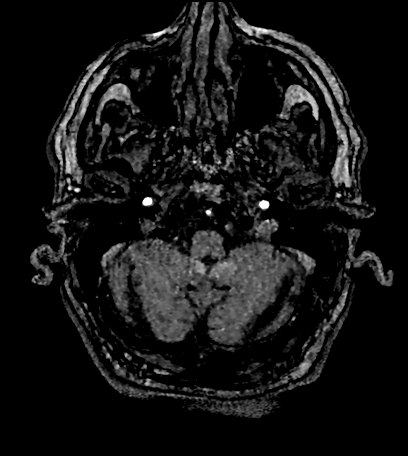
[im 51/217]
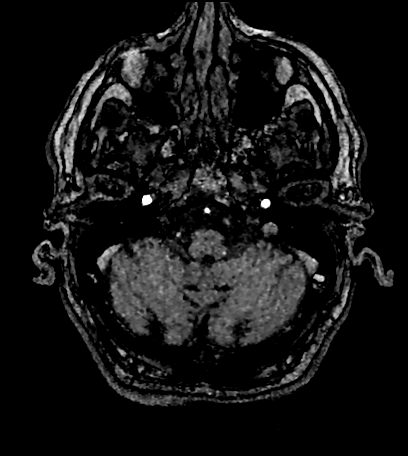
[im 56/217]
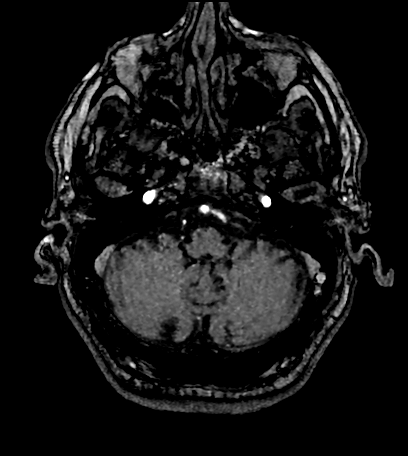
[im 60/217]
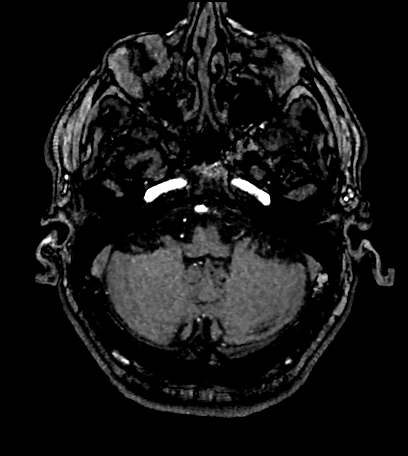
[im 65/217]
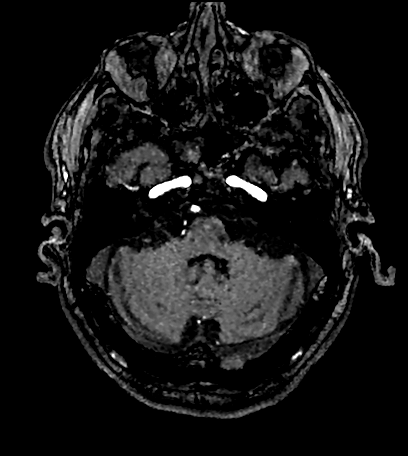
[im 69/217]
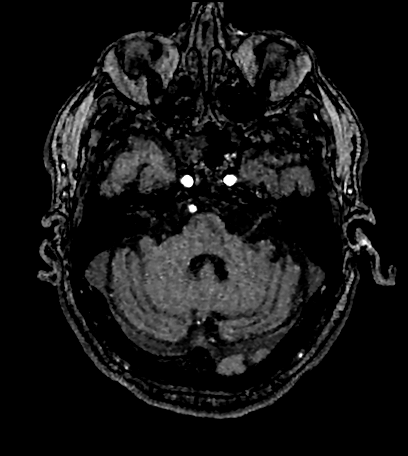
[im 74/217]
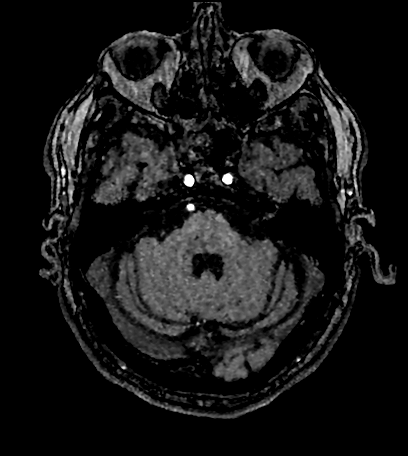
[im 79/217]
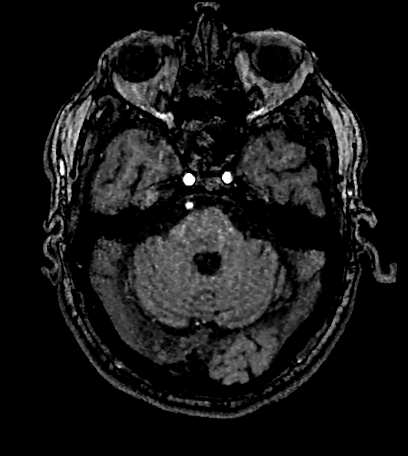
[im 97/217]
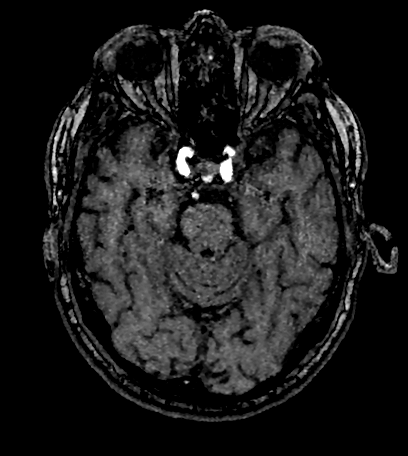
[im 111/217]
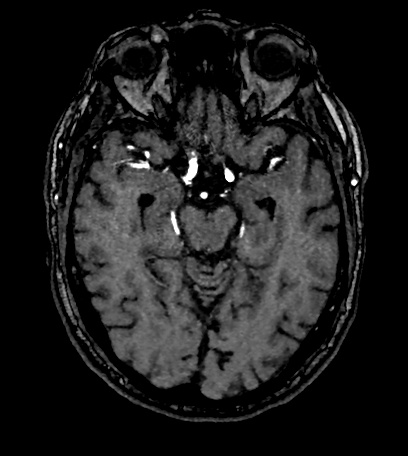
[im 125/217]
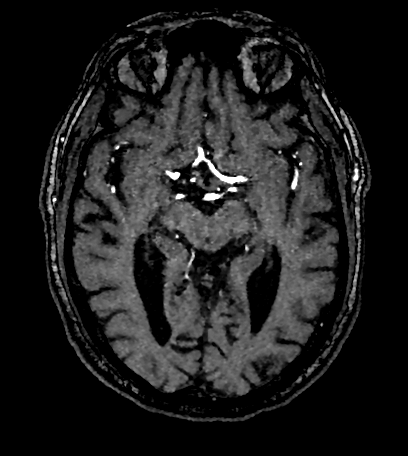
[im 152/217]
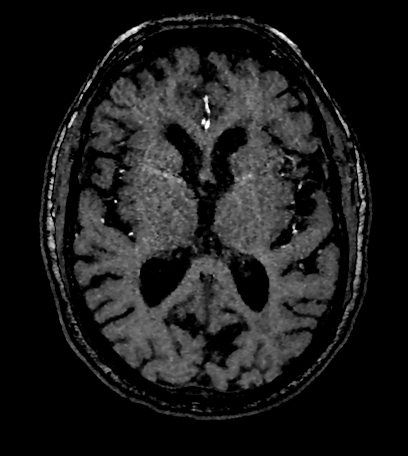
[im 180/217]
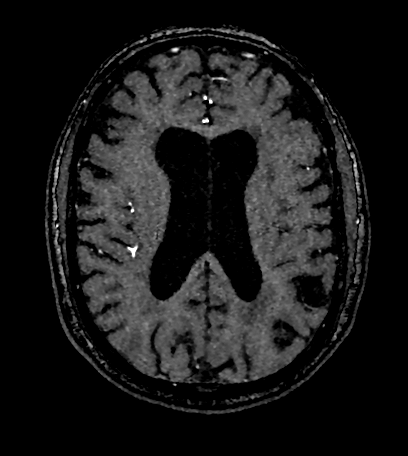
[im 184/217]
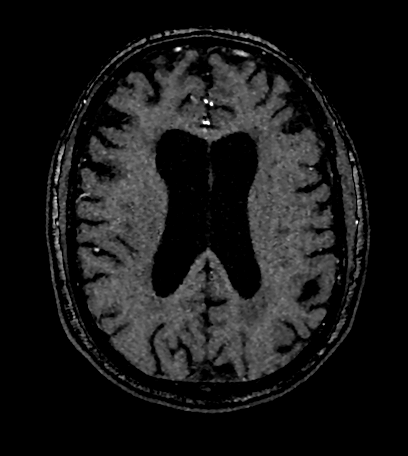
[im 207/217]
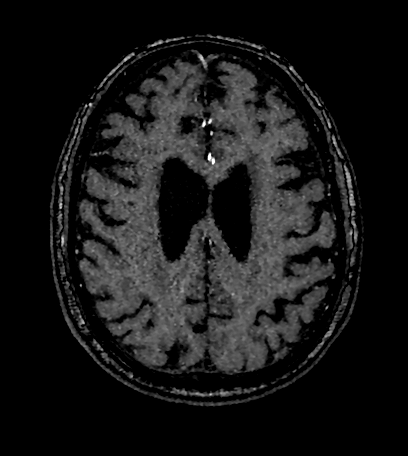

[25 of 48 positions shown; findings below may reference images not displayed]

FINDINGS: MR HEAD FINDINGS

Brain: 5 mm area of restricted diffusion left cerebellar tonsil
consistent with acute infarct. No other acute infarct identified

Mild to moderate white matter changes with scattered periventricular
deep white matter hyperintensity. Mild cerebral atrophy. Negative
for hemorrhage or mass or fluid collection.

Vascular: Normal arterial flow voids at the skull base.

Skull and upper cervical spine: Negative

Sinuses/Orbits: Moderate mucosal edema paranasal sinuses. Mastoid
clear. Negative orbit

Other: None

MRA HEAD FINDINGS

Anterior circulation: Internal carotid artery patent without
stenosis. Anterior and middle cerebral arteries patent without
stenosis or large vessel occlusion. No aneurysm.

Posterior circulation: Severe stenosis distal left vertebral artery
with irregularity. There is decreased flow related signal in the
left internal carotid artery likely due to occlusion of the proximal
left vertebral artery as identified on the MRA neck described
below. Left PICA not visualized

Right vertebral artery mildly stenotic. Right PICA patent. Basilar
patent. Posterior cerebral arteries patent without stenosis or
aneurysm.

Anatomic variants:None

MRA NECK FINDINGS

Aortic arch: Normal aortic arch. Proximal great vessels widely
patent.

Right carotid system: Mild atherosclerotic disease right carotid
bifurcation. Right internal carotid artery widely patent. Mild
stenosis origin of right external carotid artery.

Left carotid system: Left carotid widely patent. Left bifurcation
scratch the left carotid bifurcation widely patent.

Vertebral arteries: Proximal left vertebral artery is occluded with
reconstitution in the mid cervical spine. Severe stenosis distal
left vertebral artery just before the basilar. Left PICA not
visualized.

Mild stenosis origin of right vertebral artery. Mild stenosis distal
right vertebral artery. Right PICA patent.

Other: None
IMPRESSION: 1. Small acute infarct left cerebellar tonsil. The left vertebral
artery is occluded at the origin. There is a severe stenosis in the
left V4 segment. Left PICA not visualized.
2. Mild stenosis proximal and distal right vertebral artery.  But
3. Both carotid arteries are patent to the skull base without
significant stenosis.
4. Atrophy and mild to moderate chronic microvascular ischemia.

## 2021-06-12 IMAGING — MR MR MRA NECK WO/W CM
7 of 8 series · 37 of 48 positions shown · IV contrast (gadavist)
Comparison: CT head [DATE]

CLINICAL DATA: TIA

EXAM:
MRI HEAD WITHOUT CONTRAST
MRA HEAD WITHOUT CONTRAST
MRA OF THE NECK WITHOUT AND WITH CONTRAST
TECHNIQUE: Multiplanar, multi-echo pulse sequences of the brain and surrounding
structures were acquired without intravenous contrast. Angiographic
images of the Circle of Willis were acquired using MRA technique
without intravenous contrast. Angiographic images of the neck were
acquired using MRA technique without and with intravenous contrast.
Carotid stenosis measurements (when applicable) are obtained
utilizing NASCET criteria, using the distal internal carotid
diameter as the denominator.
CONTRAST:  8mL GADAVIST GADOBUTROL 1 MMOL/ML IV SOLN

[Series 13: angio_fl3d_cor_pre_ttc=2.0s · coronal · 0.9mm · 0.85mm/px · 8 of 96 slices shown]
[im 1/96]
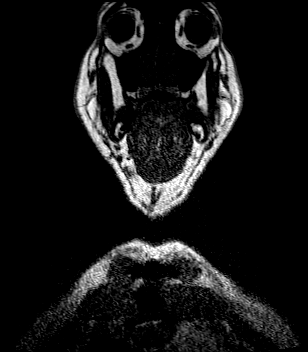
[im 14/96]
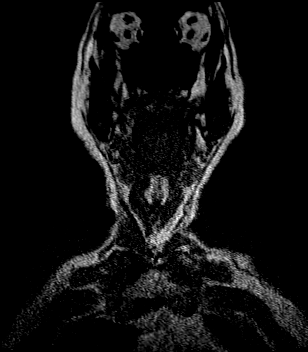
[im 28/96]
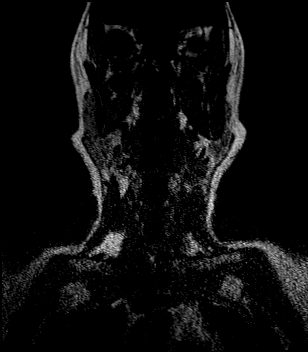
[im 41/96]
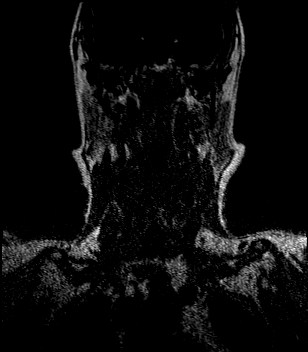
[im 55/96]
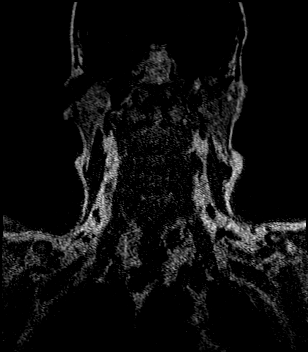
[im 68/96]
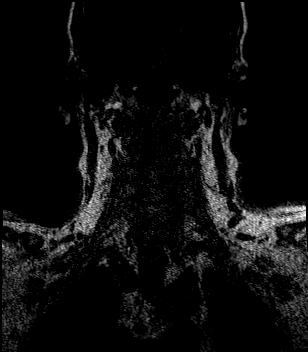
[im 82/96]
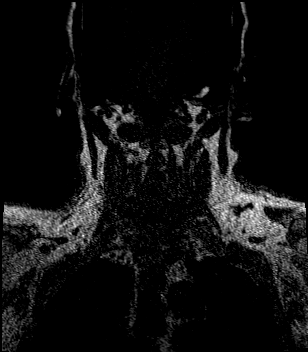
[im 96/96]
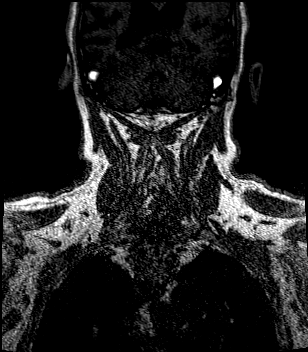

[Series 15: angio_fl3d_cor_post_ttc=2.0s · coronal · 0.9mm · 0.85mm/px · 8 of 96 slices shown]
[im 1/96]
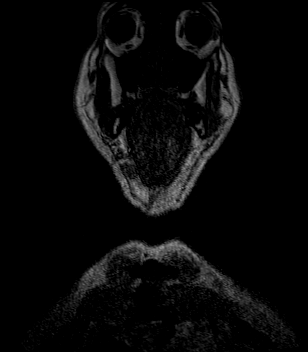
[im 14/96]
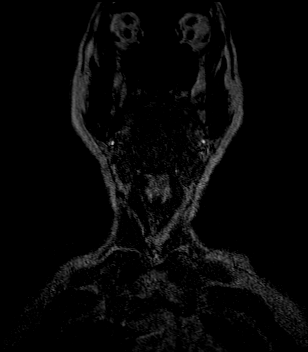
[im 28/96]
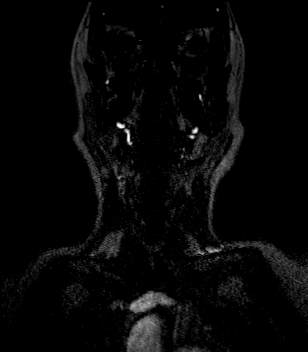
[im 41/96]
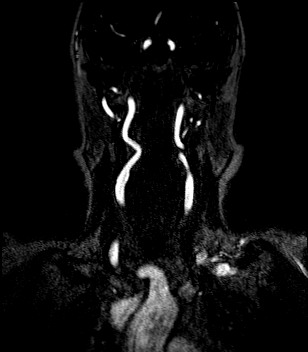
[im 55/96]
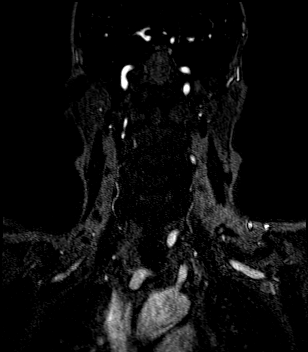
[im 68/96]
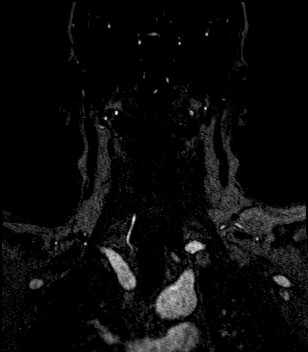
[im 82/96]
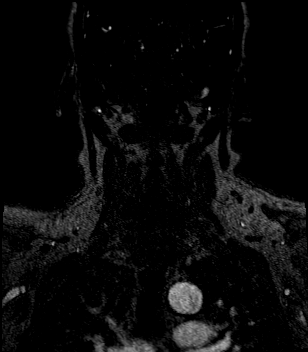
[im 96/96]
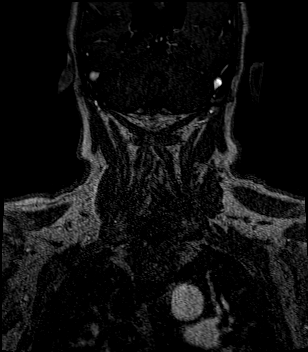

[Series 16: angio_fl3d_cor_post_ttc=2.0s_moco-adv · coronal · 0.9mm · 0.85mm/px · 9 of 96 slices shown]
[im 1/96]
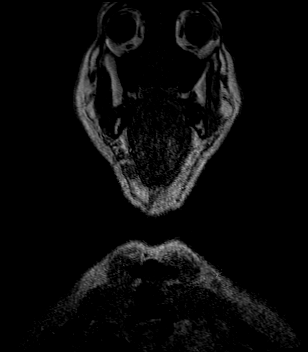
[im 12/96]
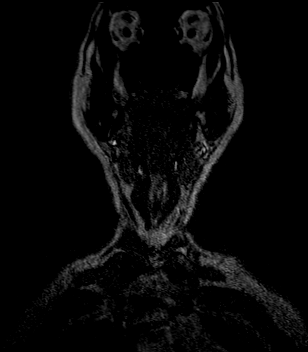
[im 24/96]
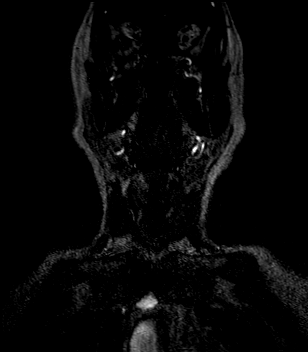
[im 36/96]
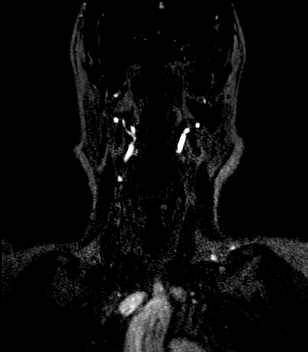
[im 48/96]
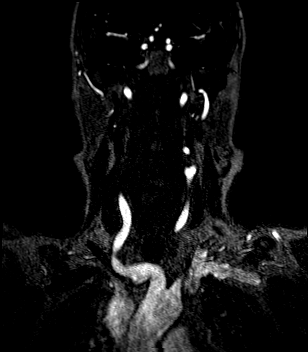
[im 60/96]
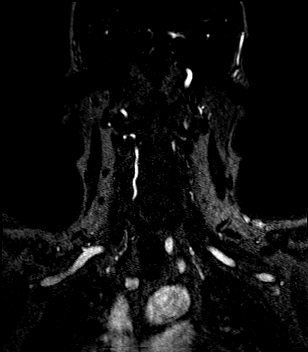
[im 72/96]
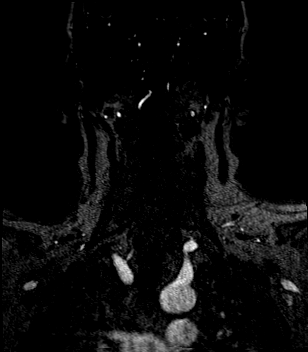
[im 84/96]
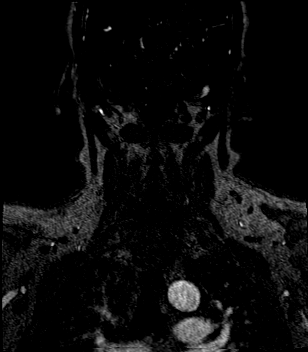
[im 96/96]
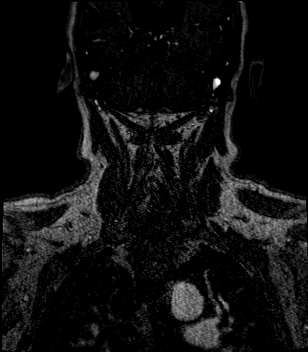

[Series 17: angio_fl3d_cor_post_ttc=2.0s_moco-adv_sub · coronal · 0.9mm · 0.85mm/px · 9 of 96 slices shown]
[im 1/96]
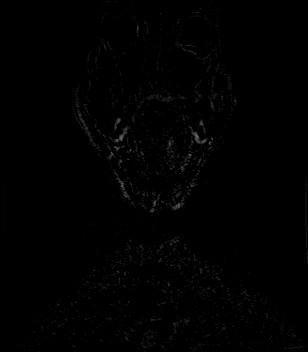
[im 12/96]
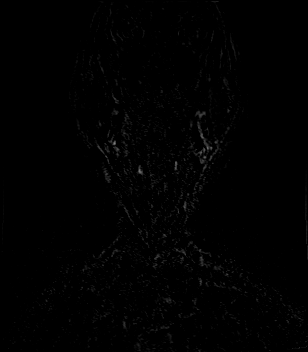
[im 24/96]
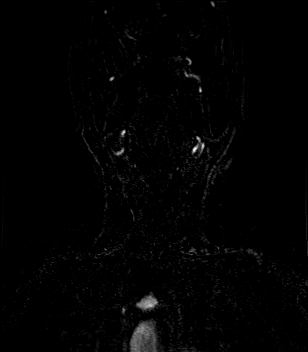
[im 36/96]
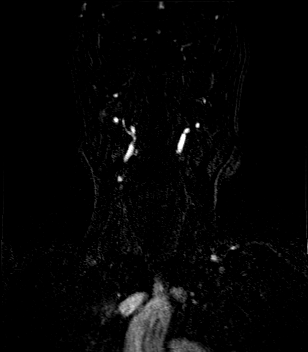
[im 48/96]
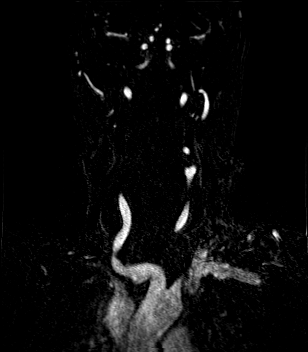
[im 60/96]
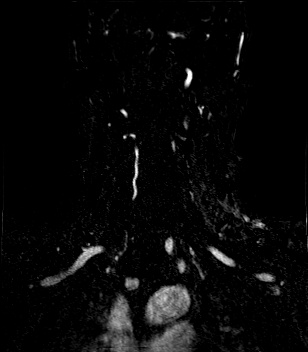
[im 72/96]
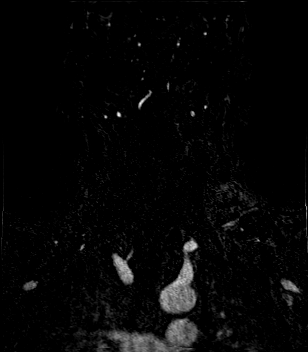
[im 84/96]
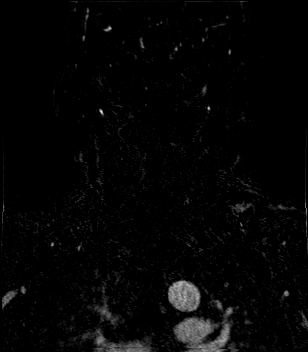
[im 96/96]
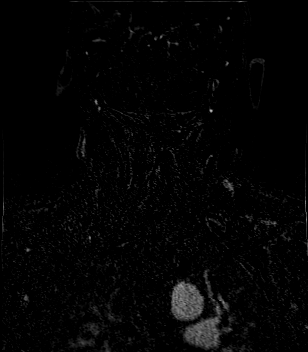

[Series 1090: rcca · 0.57mm/px · 1 of 9 slices shown]
[im 1/9]
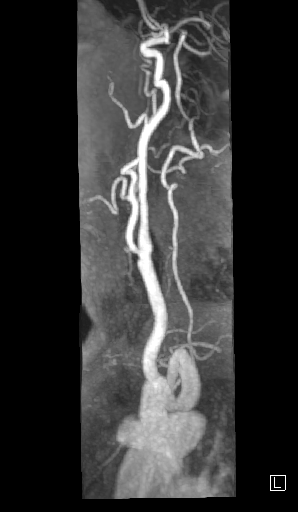

[Series 1094: lcca · 0.59mm/px · 1 of 4 slices shown]
[im 1/4]
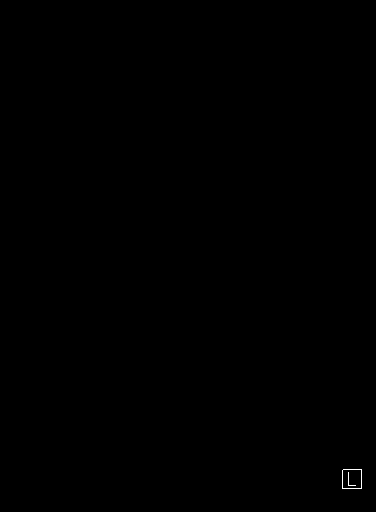

[Series 1098: verts · coronal · 0.9mm · 0.60mm/px · 1 of 3 slices shown]
[im 1/3]
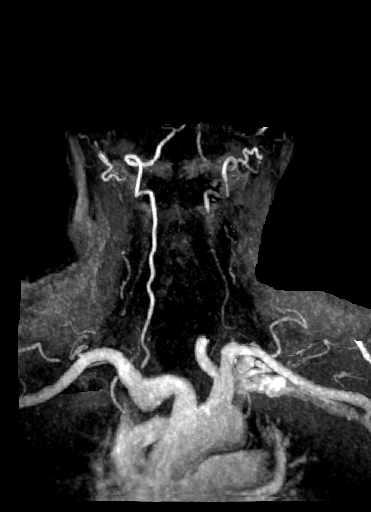

[37 of 48 positions shown; findings below may reference images not displayed]

FINDINGS: MR HEAD FINDINGS

Brain: 5 mm area of restricted diffusion left cerebellar tonsil
consistent with acute infarct. No other acute infarct identified

Mild to moderate white matter changes with scattered periventricular
deep white matter hyperintensity. Mild cerebral atrophy. Negative
for hemorrhage or mass or fluid collection.

Vascular: Normal arterial flow voids at the skull base.

Skull and upper cervical spine: Negative

Sinuses/Orbits: Moderate mucosal edema paranasal sinuses. Mastoid
clear. Negative orbit

Other: None

MRA HEAD FINDINGS

Anterior circulation: Internal carotid artery patent without
stenosis. Anterior and middle cerebral arteries patent without
stenosis or large vessel occlusion. No aneurysm.

Posterior circulation: Severe stenosis distal left vertebral artery
with irregularity. There is decreased flow related signal in the
left internal carotid artery likely due to occlusion of the proximal
left vertebral artery as identified on the MRA neck described
below. Left PICA not visualized

Right vertebral artery mildly stenotic. Right PICA patent. Basilar
patent. Posterior cerebral arteries patent without stenosis or
aneurysm.

Anatomic variants:None

MRA NECK FINDINGS

Aortic arch: Normal aortic arch. Proximal great vessels widely
patent.

Right carotid system: Mild atherosclerotic disease right carotid
bifurcation. Right internal carotid artery widely patent. Mild
stenosis origin of right external carotid artery.

Left carotid system: Left carotid widely patent. Left bifurcation
scratch the left carotid bifurcation widely patent.

Vertebral arteries: Proximal left vertebral artery is occluded with
reconstitution in the mid cervical spine. Severe stenosis distal
left vertebral artery just before the basilar. Left PICA not
visualized.

Mild stenosis origin of right vertebral artery. Mild stenosis distal
right vertebral artery. Right PICA patent.

Other: None
IMPRESSION: 1. Small acute infarct left cerebellar tonsil. The left vertebral
artery is occluded at the origin. There is a severe stenosis in the
left V4 segment. Left PICA not visualized.
2. Mild stenosis proximal and distal right vertebral artery.  But
3. Both carotid arteries are patent to the skull base without
significant stenosis.
4. Atrophy and mild to moderate chronic microvascular ischemia.

## 2021-06-12 MED ORDER — FLUTICASONE PROPIONATE 50 MCG/ACT NA SUSP: 1.0000 | Freq: Every day | NASAL | Status: DC

## 2021-06-12 MED ORDER — ATORVASTATIN CALCIUM 20 MG PO TABS
80.0000 mg | ORAL_TABLET | Freq: Every day | ORAL | Status: DC
  Administered 2021-06-13: 80 mg via ORAL
  Filled 2021-06-12: qty 4

## 2021-06-12 MED ORDER — VITAMIN D 25 MCG (1000 UNIT) PO TABS
1000.0000 [IU] | ORAL_TABLET | Freq: Every day | ORAL | Status: DC
  Administered 2021-06-12 – 2021-06-13 (×2): 1000 [IU] via ORAL
  Filled 2021-06-12 (×2): qty 1

## 2021-06-12 MED ORDER — DULOXETINE HCL 30 MG PO CPEP
30.0000 mg | ORAL_CAPSULE | Freq: Every day | ORAL | Status: DC
  Administered 2021-06-12 – 2021-06-13 (×2): 30 mg via ORAL
  Filled 2021-06-12 (×2): qty 1

## 2021-06-12 MED ORDER — ONDANSETRON HCL 4 MG/2ML IJ SOLN
4.0000 mg | Freq: Three times a day (TID) | INTRAMUSCULAR | Status: DC | PRN
Start: 2021-06-12 — End: 2021-06-13

## 2021-06-12 MED ORDER — PRAVASTATIN SODIUM 20 MG PO TABS
40.0000 mg | ORAL_TABLET | Freq: Every day | ORAL | Status: DC
  Administered 2021-06-12: 40 mg via ORAL
  Filled 2021-06-12: qty 2

## 2021-06-12 MED ORDER — GADOBUTROL 1 MMOL/ML IV SOLN
8.0000 mL | Freq: Once | INTRAVENOUS | Status: AC | PRN
  Administered 2021-06-12: 8 mL via INTRAVENOUS

## 2021-06-12 MED ORDER — ISOSORBIDE MONONITRATE ER 30 MG PO TB24
15.0000 mg | ORAL_TABLET | Freq: Every day | ORAL | Status: DC
  Administered 2021-06-12 – 2021-06-13 (×2): 15 mg via ORAL
  Filled 2021-06-12 (×2): qty 1

## 2021-06-12 MED ORDER — ENOXAPARIN SODIUM 40 MG/0.4ML IJ SOSY
40.0000 mg | PREFILLED_SYRINGE | INTRAMUSCULAR | Status: DC
  Administered 2021-06-12 – 2021-06-13 (×2): 40 mg via SUBCUTANEOUS
  Filled 2021-06-12 (×2): qty 0.4

## 2021-06-12 MED ORDER — SENNOSIDES-DOCUSATE SODIUM 8.6-50 MG PO TABS
1.0000 | ORAL_TABLET | Freq: Every evening | ORAL | Status: DC | PRN
  Administered 2021-06-12: 1 via ORAL
  Filled 2021-06-12: qty 1

## 2021-06-12 MED ORDER — FINASTERIDE 5 MG PO TABS
5.0000 mg | ORAL_TABLET | Freq: Every day | ORAL | Status: DC
  Administered 2021-06-12 – 2021-06-13 (×2): 5 mg via ORAL
  Filled 2021-06-12 (×2): qty 1

## 2021-06-12 MED ORDER — ACETAMINOPHEN 650 MG RE SUPP
650.0000 mg | RECTAL | Status: DC | PRN
Start: 2021-06-12 — End: 2021-06-13

## 2021-06-12 MED ORDER — TRAZODONE HCL 50 MG PO TABS
50.0000 mg | ORAL_TABLET | Freq: Every evening | ORAL | Status: DC | PRN
  Administered 2021-06-12: 50 mg via ORAL
  Filled 2021-06-12: qty 1

## 2021-06-12 MED ORDER — ACETAMINOPHEN 160 MG/5ML PO SOLN
650.0000 mg | ORAL | Status: DC | PRN
  Filled 2021-06-12: qty 20.3

## 2021-06-12 MED ORDER — STROKE: EARLY STAGES OF RECOVERY BOOK: Freq: Once | Status: AC

## 2021-06-12 MED ORDER — ACETAMINOPHEN 325 MG PO TABS: 650.0000 mg | ORAL_TABLET | ORAL | Status: DC | PRN

## 2021-06-12 MED ORDER — ALPHA-LIPOIC ACID 600 MG PO CAPS: 600.0000 mg | ORAL_CAPSULE | Freq: Two times a day (BID) | ORAL | Status: DC

## 2021-06-12 MED ORDER — INSULIN ASPART 100 UNIT/ML IJ SOLN
0.0000 [IU] | Freq: Three times a day (TID) | INTRAMUSCULAR | Status: DC
  Administered 2021-06-12: 2 [IU] via SUBCUTANEOUS
  Administered 2021-06-13: 1 [IU] via SUBCUTANEOUS
  Filled 2021-06-12 (×2): qty 1

## 2021-06-12 MED ORDER — HYDRALAZINE HCL 20 MG/ML IJ SOLN: 5.0000 mg | INTRAMUSCULAR | Status: DC | PRN

## 2021-06-12 MED ORDER — CRANBERRY 500 MG PO CAPS: 2.0000 | ORAL_CAPSULE | Freq: Every day | ORAL | Status: DC

## 2021-06-12 MED ORDER — CLOPIDOGREL BISULFATE 75 MG PO TABS
75.0000 mg | ORAL_TABLET | Freq: Every day | ORAL | Status: DC
  Administered 2021-06-12 – 2021-06-13 (×2): 75 mg via ORAL
  Filled 2021-06-12 (×2): qty 1

## 2021-06-12 MED ORDER — INSULIN ASPART 100 UNIT/ML IJ SOLN: 0.0000 [IU] | Freq: Every day | INTRAMUSCULAR | Status: DC

## 2021-06-12 MED ORDER — GABAPENTIN 300 MG PO CAPS
600.0000 mg | ORAL_CAPSULE | Freq: Two times a day (BID) | ORAL | Status: DC
  Administered 2021-06-12 (×2): 600 mg via ORAL
  Filled 2021-06-12 (×3): qty 2

## 2021-06-12 MED ORDER — ASPIRIN EC 81 MG PO TBEC
81.0000 mg | DELAYED_RELEASE_TABLET | Freq: Every day | ORAL | Status: DC
  Administered 2021-06-12 – 2021-06-13 (×2): 81 mg via ORAL
  Filled 2021-06-12 (×2): qty 1

## 2021-06-12 NOTE — Progress Notes (Signed)
PHARMACIST - PHYSICIAN ORDER COMMUNICATION ? ?CONCERNING: P&T Medication Policy on Herbal Medications ? ?DESCRIPTION:  This patient?s order for:  ALPHA-LIPOIC ACID 600 MG and Cranberry '1000mg'$  CAPS  has been noted. ? ?This product(s) is classified as an ?herbal? or natural product. ?Due to a lack of definitive safety studies or FDA approval, nonstandard manufacturing practices, plus the potential risk of unknown drug-drug interactions while on inpatient medications, the Pharmacy and Therapeutics Committee does not permit the use of ?herbal? or natural products of this type within [facility]. ?  ?ACTION TAKEN: ?The pharmacy department is unable to verify this order at this time . ? ?Please reevaluate patient?s clinical condition at discharge and address if the herbal or natural product(s) should be resumed at that time. ? ?Pernell Dupre, PharmD, BCPS ?Clinical Pharmacist ?06/12/2021 ?9:44 AM ? ? ?

## 2021-06-12 NOTE — ED Notes (Signed)
Dr Niu at bedside 

## 2021-06-12 NOTE — Evaluation (Signed)
Physical Therapy Evaluation ?Patient Details ?Name: Aaron Stafford ?MRN: [id number] ?DOB: 28-Dec-1934 ?Today's Date: 06/12/2021 ? ?History of Present Illness ? 87 y.o. male with medical history significant of hypertension, hyperlipidemia, diabetes mellitus, GERD, depression, CAD, CABG, stent placement, BPH, CHF, shingles, who presents with difficulty speaking.   Pt moving furniture at home last night, fell with a small injury to the back of his head, but denies head or neck pain.  No loss of consciousness.  6 AM morning of admission, he started having difficulty speaking.  He also had weakness in both legs.  No numbness or tinglings in extremities.  No facial droop, hearing loss or vision loss  ?Clinical Impression ? Pt did well with PT exam and per his and family report seems to functionally be at/near his baseline.  He does have some very mild studder/slurring with speech (though apparently there was some of this before this morning as well) but otherwise appears to have little to no residual S/S of his CVA.  Pt able to easily ambulate >300 ft w/o AD and with consistent (if occasionally wobbly) cadence with no LOBs, significant change in vitals and good overall confidence and safety.  Will maintain on caseload for further f/u to insure no extension of symptoms, facilitate activity and to trial stairs.  Pt will very likely not require further PT intervention at discharge, did discuss possible SLP given his mild speech deficits. ?   ? ?Recommendations for follow up therapy are one component of a multi-disciplinary discharge planning process, led by the attending physician.  Recommendations may be updated based on patient status, additional functional criteria and insurance authorization. ? ?Follow Up Recommendations No PT follow up ? ?  ?Assistance Recommended at Discharge PRN  ?Patient can return home with the following ? Assist for transportation ? ?  ?Equipment Recommendations    ?Recommendations for Other Services ?     ?  ?Functional Status Assessment Patient has not had a recent decline in their functional status  ? ?  ?Precautions / Restrictions Precautions ?Precautions: Fall ?Restrictions ?Weight Bearing Restrictions: No  ? ?  ? ?Mobility ? Bed Mobility ?Overal bed mobility: Independent ?  ?  ?  ?  ?  ?  ?General bed mobility comments: easily gets to sitting EOB w/o assist ?  ? ?Transfers ?Overall transfer level: Modified independent ?Equipment used: None ?  ?  ?  ?  ?  ?  ?  ?General transfer comment: Able to rise and maintain standing w/o issue, good confidence and safety ?  ? ?Ambulation/Gait ?Ambulation/Gait assistance: Min guard ?Gait Distance (Feet): 300 Feet ?Assistive device: None ?  ?  ?  ?  ?General Gait Details: Pt with self selected wide BOS on first few steps and maintained to a lesser degree t/o ambulation but was able to easily and confidently do prolonged bout of ambulation w/o LOBs and only minimal intermittent side/side wobble w/o overt unsteadiness, buckling or other safety concerns ? ?Stairs ?  ?  ?  ?  ?  ? ?Wheelchair Mobility ?  ? ?Modified Rankin (Stroke Patients Only) ?  ? ?  ? ?Balance Overall balance assessment: Modified Independent, Mild deficits observed, not formally tested ?  ?  ?  ?  ?  ?  ?  ?  ?  ?  ?  ?  ?  ?  ?  ?  ?  ?  ?   ? ? ? ?Pertinent Vitals/Pain Pain Assessment ?Pain Assessment: No/denies pain  ? ? ?  Home Living Family/patient expects to be discharged to:: Private residence ?Living Arrangements: Spouse/significant other ?Available Help at Discharge: Family;Available PRN/intermittently ?  ?  ?  ?  ?  ?  ?Home Equipment: None ?   ?  ?Prior Function Prior Level of Function : Independent/Modified Independent;Working/employed ?  ?  ?  ?  ?  ?  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  ?  ? ? ?Hand Dominance  ?   ? ?  ?Extremity/Trunk Assessment  ? Upper Extremity Assessment ?Upper Extremity Assessment: Overall WFL for tasks assessed (equal and  functional t/o) ?  ? ?Lower Extremity Assessment ?Lower Extremity Assessment: Overall WFL for tasks assessed (equal b/l and functional t/o) ?  ? ?   ?Communication  ? Communication: No difficulties (very mild slurring/studdering that family reports as only slightly different than his baseline)  ?Cognition Arousal/Alertness: Awake/alert ?Behavior During Therapy: [facility] for tasks assessed/performed ?Overall Cognitive Status: Within Functional Limits for tasks assessed ?  ?  ?  ?  ?  ?  ?  ?  ?  ?  ?  ?  ?  ?  ?  ?  ?  ?  ?  ? ?  ?General Comments   ? ?  ?Exercises    ? ?Assessment/Plan  ?  ?PT Assessment Patient does not need any further PT services  ?PT Problem List Decreased balance ? ?   ?  ?PT Treatment Interventions DME instruction;Gait training;Functional mobility training;Therapeutic exercise;Therapeutic activities;Balance training;Neuromuscular re-education;Stair training;Patient/family education   ? ?PT Goals (Current goals can be found in the Care Plan section)  ?Acute Rehab PT Goals ?Patient Stated Goal: go home ?PT Goal Formulation: With patient ?Time For Goal Achievement: 06/26/21 ?Potential to Achieve Goals: Good ? ?  ?Frequency Min 2X/week ?  ? ? ?Co-evaluation   ?  ?  ?  ?  ? ? ?  ?AM-PAC PT "6 Clicks" Mobility  ?Outcome Measure Help needed turning from your back to your side while in a flat bed without using bedrails?: None ?Help needed moving from lying on your back to sitting on the side of a flat bed without using bedrails?: None ?Help needed moving to and from a bed to a chair (including a wheelchair)?: None ?Help needed standing up from a chair using your arms (e.g., wheelchair or bedside chair)?: None ?Help needed to walk in hospital room?: None ?Help needed climbing 3-5 steps with a railing? : None ?6 Click Score: 24 ? ?  ?End of Session   ?Activity Tolerance: Patient tolerated treatment well ?Patient left: in bed;with call bell/phone within reach;with family/visitor present ?Nurse Communication:  Mobility status (possible speech consult) ?PT Visit Diagnosis: Other symptoms and signs involving the nervous system (R29.898) ?  ? ?Time: 1093-2355 ?PT Time Calculation (min) (ACUTE ONLY): 21 min ? ? ?Charges:   PT Evaluation ?$PT Eval Low Complexity: 1 Low ?  ?  ?   ? ? ?Kreg Shropshire, DPT ?06/12/2021, 2:44 PM ? ?

## 2021-06-12 NOTE — ED Notes (Signed)
Pt to CT

## 2021-06-12 NOTE — Assessment & Plan Note (Addendum)
Patient's presenting symptoms on 06/12/21 were concerning for possible TIA versus small stroke.   ?CT head was negative on admission.  ?Dr. Quinn Axe of neurology was consulted 06/12/21 ?Placed on tele med bed for observation ?MRI of brain and MRA head and neck : SEE RESULTS BELOW ?Held oral Bp meds to allow permissive HTN in the setting of acute stroke, for SBP>220 or dBP>110 ?ASA + Plavix x 90 days then Plavix alone after that ?Pravastatin to transition to high potency statin for secondary prevention  ?fasting lipid panel and HbA1c resulted, no serious concerns  ?2D transthoracic echocardiography: SEE RESULTS BELOW  ?swallowing screen ?PT/OT consult ? ? ? ?MRI of brain and MRA showed acute small cerebellar stroke. Dr. Quinn Axe recommended to keep patient on DAPT for 90 days given history of vertebral occlusion (aspirin 81 mg daily plus Plavix 75 mg daily), followed by Plavix 75 mg daily after that. ? ?1. Small acute infarct left cerebellar tonsil. The left vertebral ?artery is occluded at the origin. There is a severe stenosis in the ?left V4 segment. Left PICA not visualized. ?2. Mild stenosis proximal and distal right vertebral artery.  But ?3. Both carotid arteries are patent to the skull base without ?significant stenosis. ?4. Atrophy and mild to moderate chronic microvascular ischemia. ? ?Addendum: 2D echo showed EF of 50-55% with grade 2 diastolic dysfunction, also showed some aortic valve abnormalities. Echodensity along the ventricular aspect of the aortic valve is seen on the parasternal long axis images and is similar in appearance to study  from 03/22/2021. Clinical significance in regard to the patient's TIA is uncertain. Per Dr. Quinn Axe, pt will not need TEE. Pt can be discharged today and follow-up with neurology. ? ? ? ?

## 2021-06-12 NOTE — ED Notes (Signed)
Cbg is 173. ?

## 2021-06-12 NOTE — ED Notes (Signed)
Pt spoke with MRI screener. ?

## 2021-06-12 NOTE — Progress Notes (Signed)
Admission profile updated. ?

## 2021-06-12 NOTE — H&P (Addendum)
?History and Physical  ? ? ?Aaron Stafford UKG:[id number] DOB: 1934-03-09 DOA: 06/12/2021 ? ?Referring MD/NP/PA:  ? ?PCP: Ria Bush, MD  ? ?Patient coming from:  The patient is coming from home.  At baseline, pt is independent for most of ADL.       ? ?Chief Complaint: Difficulty speaking ? ?HPI: Aaron Stafford is a 87 y.o. male with medical history significant of hypertension, hyperlipidemia, diabetes mellitus, GERD, depression, CAD, CABG, stent placement, BPH, dCHF, shingles, who presents with difficulty speaking. ? ?Patient states that he fell accidentally when he was moving furniture at home at about 9 PM last night.  He had a small injury to the back of his head, but denies head or neck pain.  No loss of consciousness.  This morning at about 6 AM, he started having difficulty speaking.  He also had weakness in both legs.  No numbness or tinglings in extremities.  No facial droop, hearing loss or vision loss. Patient denies chest pain, cough, shortness breath.  No fever or chills.  No nausea, vomiting, diarrhea or abdominal pain.  No symptoms of UTI.  Patient's symptom of difficulty speaking has improved at arrival to ED, but still has very little bit of difficulty speaking. ? ?Data Reviewed and ED Course: pt was found to have WBC 6.8, GFR> 60, temperature normal, blood pressure 173/74, heart rate 56, RR 16, oxygen saturation 97% on room air.  CT of head is negative for acute intracranial abnormalities.  Patient is placed on telemetry bed for observation, Dr. Quinn Axe of neurology is consulted. ? ?EKG: I have personally reviewed.  Sinus rhythm, QTc 467, bifascicular block which is old, poor R wave progression ? ? ?Review of Systems:  ? ?General: no fevers, chills, no body weight gain, fatigue ?HEENT: no blurry vision, hearing changes or sore throat ?Respiratory: no dyspnea, coughing, wheezing ?CV: no chest pain, no palpitations ?GI: no nausea, vomiting, abdominal pain, diarrhea, constipation ?GU: no dysuria,  burning on urination, increased urinary frequency, hematuria  ?Ext: no leg edema ?Neuro: no unilateral weakness, numbness, or tingling, no vision change or hearing loss. Has fall and difficulty speaking. ?Skin: no rash, no skin tear. ?MSK: No muscle spasm, no deformity, no limitation of range of movement in spin ?Heme: No easy bruising.  ?Travel history: No recent long distant travel. ? ? ?Allergy:  ?Allergies  ?Allergen Reactions  ? Morphine Sulfate   ?  REACTION: Hallucinations  ? Nitroglycerin   ?  REACTION: Decreased BP  ? Contrast Media [Iodinated Contrast Media] Rash  ?  Severe hives  ? ? ?Past Medical History:  ?Diagnosis Date  ? CAD (coronary artery disease)   ? a. 1995 s/p CABG x 4 (LIMA->LAD, VG->OM1->OM2, VG->RCA); b. 07/2009 PCI: LM 60, LAD 100ost, LCX 100p, RCA 100, LIMA->LAD atretic, VG->OM1->OM2 patent w/ collats to dLAD, VG->RCA 95 (3.5x15 Vision BMS). EF 55%; c. 02/2021 MV: EF 30% (50-55% by echo), large inferolateral scar w/ HK. No ischemia.  ? Chronic rhinitis   ? ENT [place]  ? COVID-19 virus infection 09/2019  ? Diabetes mellitus type II   ? Diastolic dysfunction   ? a. 03/2021 Echo: EF 50-55%, no rwma, GrI DD. RVSP 35.87mHg. Mild MR.  ? Diverticulosis 06/2011  ? by CT and colonoscopy  ? Fatty liver 06/2011  ? by CT  ? GERD (gastroesophageal reflux disease)   ? History of BPH   ? History of shingles   ? HLD (hyperlipidemia)   ? HTN (hypertension)   ?  Ischemic heart disease   ? Melanoma ([place]) 12/2015  ? L forearm, scalp  ? MI (myocardial infarction) ([place]) 1995  ? Shingles rash 09/26/2012  ? Sialadenitis 05/01/2017  ? ? ?Past Surgical History:  ?Procedure Laterality Date  ? ANKLE SURGERY  03/2002  ? Left ankle fracture  ? CARDIAC CATHETERIZATION    ? CHOLECYSTECTOMY  1985  ? COLONOSCOPY    ? diverticulosis, tortuous colon with looping Aaron Stafford)  ? CORONARY ARTERY BYPASS GRAFT  05/1993  ? x6 MI Attemped angioplasty  ? CORONARY STENT PLACEMENT  07/25/2009  ? Multi-Link VISION Cobalt chromium   ?  CYSTOSCOPY  10/03/08  ? Normal-Dr. Bernardo Heater  ? INGUINAL HERNIA REPAIR  11/2000  ? and ventral hernia repair  ? ? ?Social History:  reports that he has never smoked. He has never used smokeless tobacco. He reports that he does not drink alcohol and does not use drugs. ? ?Family History:  ?Family History  ?Problem Relation Age of Onset  ? Kidney failure Mother   ? Coronary artery disease Mother   ? Heart failure Mother   ? Hypertension Brother   ? Heart attack Brother   ? Alcohol abuse Brother   ? Hypertension Brother   ? Hypertension Sister   ?  ? ?Prior to Admission medications   ?Medication Sig Start Date End Date Taking? Authorizing Provider  ?Alpha-Lipoic Acid 600 MG CAPS Take 1 capsule (600 mg total) by mouth 2 (two) times daily. 06/26/16   Ria Bush, MD  ?aspirin 81 MG tablet Take 81 mg by mouth daily.    [provider]  ?Blood Glucose Monitoring Suppl ([place]) w/Device KIT Check blood sugar once daily 02/05/21   Ria Bush, MD  ?Cholecalciferol (VITAMIN D3) 1000 UNITS CAPS Take 1 capsule (1,000 Units total) by mouth daily. 12/14/14   Ria Bush, MD  ?Cranberry 500 MG CAPS Take 2 capsules (1,000 mg total) by mouth daily. 02/03/19   Ria Bush, MD  ?diclofenac Sodium (VOLTAREN) 1 % GEL Apply 2 g topically 3 (three) times daily. 08/18/19   Ria Bush, MD  ?DULoxetine (CYMBALTA) 20 MG capsule TAKE 1 CAPSULE BY MOUTH EVERY night 02/23/21   Ria Bush, MD  ?DULoxetine (CYMBALTA) 30 MG capsule TAKE 1 CAPSULE BY MOUTH EVERY DAY 05/25/21   Ria Bush, MD  ?finasteride (PROSCAR) 5 MG tablet TAKE 1 TABLET BY MOUTH EVERY DAY 05/25/21   Ria Bush, MD  ?fluticasone [facility]) 50 MCG/ACT nasal spray SPRAY 2 SPRAYS INTO EACH NOSTRIL EVERY DAY 10/17/20   Ria Bush, MD  ?furosemide (LASIX) 20 MG tablet TAKE 1 TABLET BY MOUTH EVERY DAY 10/17/20   Ria Bush, MD  ?gabapentin (NEURONTIN) 300 MG capsule TAKE 2 CAPSULES BY MOUTH TWICE A DAY  10/18/20   Ria Bush, MD  ?glucose blood [facility] VERIO) test strip Check blood sugar once daily 02/05/21   Ria Bush, MD  ?hydrochlorothiazide (HYDRODIURIL) 25 MG tablet TAKE 1 TABLET BY MOUTH EVERY DAY 10/17/20   Ria Bush, MD  ?isosorbide mononitrate (IMDUR) 30 MG 24 hr tablet Take 0.5 tablets (15 mg total) by mouth daily. 03/07/21 06/05/21  Theora Gianotti, NP  ?losartan (COZAAR) 50 MG tablet TAKE 1 TABLET BY MOUTH EVERY DAY 05/25/21   Ria Bush, MD  ?metFORMIN (GLUCOPHAGE) 500 MG tablet TAKE 1 TABLET BY MOUTH 2 TIMES DAILY WITH A MEAL. 10/17/20   Ria Bush, MD  ?Jonetta Speak Lancets 36R MISC Check blood sugar once daily 02/05/21   Ria Bush, MD  ?  polyethylene glycol powder (GLYCOLAX/MIRALAX) 17 GM/SCOOP powder TAKE 8.5-17 G BY MOUTH DAILY AS NEEDED FOR MODERATE CONSTIPATION. 10/18/20   Ria Bush, MD  ?pravastatin (PRAVACHOL) 40 MG tablet TAKE 1 TABLET BY MOUTH EVERYDAY AT BEDTIME 10/17/20   Ria Bush, MD  ?traZODone (DESYREL) 50 MG tablet TAKE 1 tablet BY MOUTH AT BEDTIME AS NEEDED FOR SLEEP 02/23/21   Ria Bush, MD  ?valACYclovir (VALTREX) 1000 MG tablet Take 2 tablets (2,000 mg total) by mouth 2 (two) times daily. For 1 day 02/23/21   Ria Bush, MD  ?vitamin B-12 (CYANOCOBALAMIN) 250 MCG tablet Take 250 mcg by mouth once a week.     [provider]  ? ? ?Physical Exam: ?Vitals:  ? 06/12/21 0741 06/12/21 0830  ?BP: (!) 173/74 (!) 151/71  ?Pulse: (!) 56 (!) 57  ?Resp: 16 12  ?Temp: 97.7 ?F (36.5 ?C)   ?TempSrc: Oral   ?SpO2: 97% 99%  ?Weight: 81.2 kg   ?Height: _0  (1.727 m)   ? ?General: Not in acute distress ?HEENT: ?      Eyes: PERRL, EOMI, no scleral icterus. ?      ENT: No discharge from the ears and nose, no pharynx injection, no tonsillar enlargement.  ?      Neck: No JVD, no bruit, no mass felt. ?Heme: No neck lymph node enlargement. ?Cardiac: S1/S2, RRR, No murmurs, No gallops or rubs. ?Respiratory: No rales,  wheezing, rhonchi or rubs. ?GI: Soft, nondistended, nontender, no rebound pain, no organomegaly, BS present. ?GU: No hematuria ?Ext: No pitting leg edema bilaterally. 1+DP/PT pulse bilaterally. ?Musculoskeleta

## 2021-06-12 NOTE — ED Notes (Signed)
Pt to MRI

## 2021-06-12 NOTE — Assessment & Plan Note (Signed)
S/p of CABG 1995, s/p of stent placement, no chest pain. ?-Aspirin, pravastatin ?-Imdur ?

## 2021-06-12 NOTE — ED Triage Notes (Signed)
Pt to ED from home [facility] for altered speech and bilateral leg weakness (pt lowered self to floor) which resolved on way here. This occurred when pt got up at 0600 today. Pt states he couldn't get words out this morning. ? ? ?9pm last night pt was moving furniture and fell and hit head, no LOC. Went to bed at 11pm and all was normal at that time. ? ?This morning at 0600 pt got out of bed and legs gave out on him. EMS arrived about 0650, and pt was "stuttering", which is not normal for him. This resolved as pt was getting into truck. Pt had equal grip strength, LVO was +1 because of altered speech. ? ?EMS VS: 172/81, 56 HR, CBG 214. 12 lead normal. ?Hx DM, HTN, 2 stents placed, CABG. Takes aspirin daily. ?

## 2021-06-12 NOTE — Assessment & Plan Note (Signed)
Continue Proscar   

## 2021-06-12 NOTE — ED Provider Notes (Signed)
? ?[facility] ?Provider Note ? ? ? Event Date/Time  ? First MD Initiated Contact with Patient 06/12/21 [phone]   ?  (approximate) ? ? ?History  ? ?No chief complaint on file. ? ? ?HPI ? ?Aaron Stafford is a 87 y.o. male who presents to the ED for evaluation of No chief complaint on file. ?  ?I review cardiology clinic visit note from 2/9. Hx CABGx4 in 1995. Hx HTN, HLD, DM. ASA 81 is only thinner.  ? ?Patient presents to the ED by EMS from home for evaluation of a transient episode of expressive aphasia and difficulty walking. ? ?He had a minor occipital head injury last night while moving some furniture.  He was already sitting on the ground and fell backwards and struck his occiput on the floor from a sitting position.  No syncope, seizure or neurodeficits reported then.  He was reportedly at his baseline when he went to bed.  ? ?Upon awakening this morning, around 6 AM, he was unable to walk when getting out of the bed.  He lowered himself to the floor without significant trauma or falls.  He reports that he was unable to get his words out, saying that he " could think of them but the words would not come out.  " Wife reports trying to stand him up, and was able to do so with some difficulty.  She denies any laterality that she can recall or seizure activity.  This lasted a matter of minutes and he is now back at his baseline on arrival to the ED. ? ?No recent illnesses, syncopal episodes or other falls. ? ?Physical Exam  ? ?Triage Vital Signs: ?ED Triage Vitals  ?Enc Vitals Group  ?   BP   ?   Pulse   ?   Resp   ?   Temp   ?   Temp src   ?   SpO2   ?   Weight   ?   Height   ?   Head Circumference   ?   Peak Flow   ?   Pain Score   ?   Pain Loc   ?   Pain Edu?   ?   Excl. in [place]?   ? ? ?Most recent vital signs: ?Vitals:  ? 06/12/21 0741  ?BP: (!) 173/74  ?Pulse: (!) 56  ?Resp: 16  ?Temp: 97.7 ?F (36.5 ?C)  ?SpO2: 97%  ? ? ?General: Awake, no distress.  Conversational in full  sentences. ?CV:  Good peripheral perfusion.  ?Resp:  Normal effort.  ?Abd:  No distention.  ?MSK:  No deformity noted.  ?Neuro:  No focal deficits appreciated. ?Other:   ? ? ?ED Results / Procedures / Treatments  ? ?Labs ?(all labs ordered are listed, but only abnormal results are displayed) ?Labs Reviewed  ?BASIC METABOLIC PANEL - Abnormal; Notable for the following components:  ?    Result Value  ? Glucose, Bld 153 (*)   ? BUN 28 (*)   ? Calcium 8.4 (*)   ? All other components within normal limits  ?CBG MONITORING, ED - Abnormal; Notable for the following components:  ? Glucose-Capillary 173 (*)   ? All other components within normal limits  ?CBC  ?URINALYSIS, ROUTINE W REFLEX MICROSCOPIC  ? ? ?EKG ?Sinus rhythm, rate of 57 BPM, normal axis. Right bundle and left anterior fascicular block. No STEMI ? ?RADIOLOGY ?CT head reviewed by me without evidence of acute intracranial  pathology ? ? ?Official radiology report(s): ?CT HEAD WO CONTRAST ? ?Result Date: 06/12/2021 ?CLINICAL DATA:  Altered speech, bilateral leg weakness EXAM: CT HEAD WITHOUT CONTRAST TECHNIQUE: Contiguous axial images were obtained from the base of the skull through the vertex without intravenous contrast. RADIATION DOSE REDUCTION: This exam was performed according to the departmental dose-optimization program which includes automated exposure control, adjustment of the mA and/or kV according to patient size and/or use of iterative reconstruction technique. COMPARISON:  None. FINDINGS: Brain: There is no evidence of acute intracranial hemorrhage, extra-axial fluid collection, or acute infarct. There is mild global parenchymal volume loss with prominence of the ventricular system and extra-axial CSF spaces. Patchy hypodensity in the subcortical and periventricular white matter likely reflects sequela of chronic white matter microangiopathy. There is no mass lesion.  There is no mass effect or midline shift. Vascular: No hyperdense vessel or  unexpected calcification. Skull: Normal. Negative for fracture or focal lesion. Sinuses/Orbits: There is mucosal thickening in the right sphenoid sinus. Globes and orbits are unremarkable. Other: None. IMPRESSION: No acute intracranial pathology. Electronically Signed   By: Valetta Mole M.D.   On: 06/12/2021 08:06   ? ?PROCEDURES and INTERVENTIONS: ? ?.1-3 Lead EKG Interpretation ?Performed by: Vladimir Crofts, MD ?Authorized by: Vladimir Crofts, MD  ? ?  Interpretation: normal   ?  ECG rate:  58 ?  ECG rate assessment: bradycardic   ?  Rhythm: sinus bradycardia   ?  Ectopy: none   ?  Conduction: normal   ? ?Medications - No data to display ? ? ?IMPRESSION / MDM / ASSESSMENT AND PLAN / ED COURSE  ?I reviewed the triage vital signs and the nursing notes. ? ?Pleasant 87 year old male presents to the ED after transient episode of aphasia and bilateral leg weakness this morning, since resolved and concerning for TIA.  He has a normal neurologic examination on arrival to the ED without evidence of acute deficits, significant trauma or any neurologic deficits.  Looks well to me.  Blood work is reassuring with normal CBC and metabolic panel.  CT head without evidence of ICH, skull fracture or signs of a CVA.  Nonischemic EKG.  Asymptomatic now.  Story is concerning for TIA.  We will consult medicine for observation admission.  ? ?Clinical Course as of 06/12/21 0831  ?Tue Jun 12, 2021  ?0800 I performed a brief neurologic examination prior to patient going to CT.  I chat with family at the bedside extensively as patient is getting CT performed.  They tell me that his symptoms resolved and is back to baseline now.  We discussed TIAs and my recommendation for observation admission, pending CT scan and blood work and they are in agreement. [DS]  ?74 Reassessed and updated patient/family. [DS]  ?  ?Clinical Course User Index ?[DS] Vladimir Crofts, MD  ? ? ? ?FINAL CLINICAL IMPRESSION(S) / ED DIAGNOSES  ? ?Final diagnoses:  ?Expressive  aphasia  ?TIA (transient ischemic attack)  ? ? ? ?Rx / DC Orders  ? ?ED Discharge Orders   ? ? None  ? ?  ? ? ? ?Note:  This document was prepared using Dragon voice recognition software and may include unintentional dictation errors. ?  ?Vladimir Crofts, MD ?06/12/21 [phone] ? ?

## 2021-06-12 NOTE — Consult Note (Signed)
NEUROLOGY CONSULTATION NOTE  ? ?Date of service: June 13, 2021 ?Patient Name: Aaron Stafford ?MRN:  [id number] ?DOB:  Nov 28, 1934 ?Reason for consult: acute ischemic stroke ?Requesting physician: Dr. Ivor Costa ?_ _ _   _ __   _ __ _ _  __ __   _ __   __ _ ? ?History of Present Illness  ? ?87 yo with hx CAD, DM2, HTN, HL who presented to ED this AM after a transient episode of "stammering speech" around 6am today, now resolved. He was found to have a small acute ischemic infarct of the L cerebellar tonsil. He reports some dizziness yesterday evening as well resulting in a fall with minor head trauma (occipital), no LOC. This AM he also noted that his vision seemed to be "bouncing" when he looked to the L. All of these sx have now resolved. NIHSS = 0. Head CT NAICP. Stroke workup this admission: ? ?MRI brain ?MRA head ? ?1. Small acute infarct left cerebellar tonsil. The left vertebral ?artery is occluded at the origin. There is a severe stenosis in the ?left V4 segment. Left PICA not visualized. ?2. Mild stenosis proximal and distal right vertebral artery.  But ?3. Both carotid arteries are patent to the skull base without ?significant stenosis. ?4. Atrophy and mild to moderate chronic microvascular ischemia. ? ?CNS imaging personally reviewed; I agree with above interpretation ? ?TTE ? ?Left ventricular ejection fraction, by estimation, is 50 to 55%. The left ventricle has low ?normal function. The left ventricle demonstrates regional wall motion abnormalities ?(see scoring diagram/findings for description). There is moderate left ventricular ?hypertrophy of the basal-septal segment. Left ventricular diastolic parameters are ?consistent with Grade II diastolic dysfunction (pseudonormalization). Elevated left ?atrial pressure. ? ?Right ventricular systolic function is normal. The right ventricular size is normal. ?There is normal pulmonary artery systolic pressure. ? ?The mitral valve is normal in structure. Mild  mitral valve regurgitation. No evidence of ?mitral stenosis. ? ?Echodensity along the ventricular aspect of the aortic valve is seen on the parasternal ?long axis images and is similar in appearance to study from 03/22/2021. Clinical ?significance in regard to the patient's TIA is uncertain. TEE could be considered for ?further evalaution, as clinically indicated.. The aortic valve is tricuspid. Aortic valve ?regurgitation is not visualized. No aortic stenosis is present. ? ?There is mild dilatation of the ascending aorta, measuring 38 mm. ?The inferior vena cava is normal in size with greater than 50% respiratory variability, ?suggesting right atrial pressure of 3 mmHg. ? ?Stroke Labs ? ?   ?Component Value Date/Time  ? CHOL 116 06/13/2021 0340  ? TRIG 208 (H) 06/13/2021 0340  ? TRIG 173 (H) 02/27/2006 1020  ? HDL 28 (L) 06/13/2021 0340  ? CHOLHDL 4.1 06/13/2021 0340  ? VLDL 42 (H) 06/13/2021 0340  ? LDLCALC 46 06/13/2021 0340  ? LDLDIRECT 70.0 08/16/2020 0843  ? ? ?Lab Results  ?Component Value Date/Time  ? HGBA1C 6.9 (H) 06/12/2021 05:11 PM  ? ? ?  ?ROS  ? ?Per HPI: all other systems reviewed and are negative ? ?Past History  ? ?I have reviewed the following: ? ?Past Medical History:  ?Diagnosis Date  ? CAD (coronary artery disease)   ? a. 1995 s/p CABG x 4 (LIMA->LAD, VG->OM1->OM2, VG->RCA); b. 07/2009 PCI: LM 60, LAD 100ost, LCX 100p, RCA 100, LIMA->LAD atretic, VG->OM1->OM2 patent w/ collats to dLAD, VG->RCA 95 (3.5x15 Vision BMS). EF 55%; c. 02/2021 MV: EF 30% (50-55% by echo), large inferolateral scar w/  HK. No ischemia.  ? Chronic rhinitis   ? ENT [place]  ? COVID-19 virus infection 09/2019  ? Diabetes mellitus type II   ? Diastolic dysfunction   ? a. 03/2021 Echo: EF 50-55%, no rwma, GrI DD. RVSP 35.28mHg. Mild MR.  ? Diverticulosis 06/2011  ? by CT and colonoscopy  ? Fatty liver 06/2011  ? by CT  ? GERD (gastroesophageal reflux disease)   ? History of BPH   ? History of shingles   ? HLD (hyperlipidemia)   ?  HTN (hypertension)   ? Ischemic heart disease   ? [place]an[place] 12[place]2015  ? L forearm, scalp  ? [place]  ? Shingles rash 09/26/2012  ? Sialadenitis 05/01/2017  ? ?Past Surgical History:  ?Procedure Laterality Date  ? ANKLE SURGERY  03/2002  ? Left ankle fracture  ? CARDIAC CATHETERIZATION    ? CHOLECYSTECTOMY  1985  ? COLONOSCOPY    ? diverticulosis, tortuous colon with looping ([name]  ? CORONARY ARTERY BYPASS GRAFT  05/1993  ? x6 MI Attemped angioplasty  ? CORONARY STENT PLACEMENT  07/25/2009  ? Multi-Link VISION Cobalt chromium   ? CYSTOSCOPY  10/03/08  ? Normal-Dr. SBernardo Heater ? INGUINAL HERNIA REPAIR  11/2000  ? and ventral hernia repair  ? ?Family History  ?Problem Relation Age of Onset  ? Kidney failure Mother   ? Coronary artery disease Mother   ? Heart failure Mother   ? Hypertension Brother   ? Heart attack Brother   ? Alcohol abuse Brother   ? Hypertension Brother   ? Hypertension Sister   ? ?Social History  ? ?Socioeconomic History  ? Marital status: Married  ?  Spouse name: Not on file  ? Number of children: 5  ? Years of education: Not on file  ? Highest education level: Not on file  ?Occupational History  ? Occupation: PTheme park manager ?Tobacco Use  ? Smoking status: Never  ? Smokeless tobacco: Never  ?Vaping Use  ? Vaping Use: Never used  ?Substance and Sexual Activity  ? Alcohol use: No  ?  Alcohol/week: 0.0 standard drinks  ? Drug use: No  ? Sexual activity: Not on file  ?Other Topics Concern  ? Not on file  ?Social History Narrative  ? Caffeine: 1 cup/day, 2 cups tea daily  ? Lives with JBethena Roys(wife) and 3 dogs  ? Occupation: [place] park [place]  ? Activity: no regular exercise, does walk some  ? Diet: good water, fruits/vegetables daily  ? ?Social Determinants of Health  ? ?Financial Resource Strain: Low Risk   ? Difficulty of Paying Living Expenses: Not hard at all  ?Food Insecurity: No Food Insecurity  ? Worried About RCharity fundraiserin the Last Year:  Never true  ? Ran Out of Food in the Last Year: Never true  ?Transportation Needs: No Transportation Needs  ? Lack of Transportation (Medical): No  ? Lack of Transportation (Non-Medical): No  ?Physical Activity: Inactive  ? Days of Exercise per Week: 0 days  ? Minutes of Exercise per Session: 0 min  ?Stress: No Stress Concern Present  ? Feeling of Stress : Not at all  ?Social Connections: Not on file  ? ?Allergies  ?Allergen Reactions  ? Morphine Sulfate   ?  REACTION: Hallucinations  ? Nitroglycerin   ?  REACTION: Decreased BP  ? Contrast Media [Iodinated Contrast Media] Rash  ?  Severe hives  ? ? ?Medications  ? ?  Medications Prior to Admission  ?Medication Sig Dispense Refill Last Dose  ? Alpha-Lipoic Acid 600 MG CAPS Take 1 capsule (600 mg total) by mouth 2 (two) times daily.   06/11/2021  ? aspirin 81 MG tablet Take 81 mg by mouth daily.   06/11/2021  ? Cholecalciferol (VITAMIN D3) 1000 UNITS CAPS Take 1 capsule (1,000 Units total) by mouth daily. 30 capsule  Past Week  ? Cranberry 500 MG CAPS Take 2 capsules (1,000 mg total) by mouth daily.   06/11/2021  ? DULoxetine (CYMBALTA) 30 MG capsule TAKE 1 CAPSULE BY MOUTH EVERY DAY 90 capsule 1 06/11/2021  ? finasteride (PROSCAR) 5 MG tablet TAKE 1 TABLET BY MOUTH EVERY DAY 90 tablet 1 06/11/2021  ? fluticasone (FLONASE) 50 MCG/ACT nasal spray SPRAY 2 SPRAYS INTO EACH NOSTRIL EVERY DAY 48 mL 3 06/11/2021  ? furosemide (LASIX) 20 MG tablet TAKE 1 TABLET BY MOUTH EVERY DAY 90 tablet 3 06/11/2021  ? gabapentin (NEURONTIN) 300 MG capsule TAKE 2 CAPSULES BY MOUTH TWICE A DAY 360 capsule 1 06/11/2021  ? hydrochlorothiazide (HYDRODIURIL) 25 MG tablet TAKE 1 TABLET BY MOUTH EVERY DAY 90 tablet 3 06/11/2021  ? isosorbide mononitrate (IMDUR) 30 MG 24 hr tablet Take 0.5 tablets (15 mg total) by mouth daily. 90 tablet 1 06/11/2021  ? losartan (COZAAR) 50 MG tablet TAKE 1 TABLET BY MOUTH EVERY DAY 90 tablet 2 06/11/2021  ? metFORMIN (GLUCOPHAGE) 500 MG tablet TAKE 1 TABLET BY MOUTH 2 TIMES  DAILY WITH A MEAL. 180 tablet 3 06/11/2021  ? polyethylene glycol powder (GLYCOLAX/MIRALAX) 17 GM/SCOOP powder TAKE 8.5-17 G BY MOUTH DAILY AS NEEDED FOR MODERATE CONSTIPATION. 510 g 1 Past Month  ? pravast

## 2021-06-12 NOTE — Assessment & Plan Note (Signed)
Recent A1c 6.7, well controlled.  Patient is taking metformin ?-Sliding scale insulin ?

## 2021-06-12 NOTE — Assessment & Plan Note (Signed)
2D echo on 03/22/2021 showed EF of 50-55% with grade 1 diastolic dysfunction.  Patient does not have leg edema or JVD.  No shortness of breath.  CHF seem to be compensated. ?-Hold Lasix in the setting of acute TIA or small stroke ?

## 2021-06-12 NOTE — Assessment & Plan Note (Signed)
-   Fall precaution -PT/OT 

## 2021-06-12 NOTE — Progress Notes (Signed)
OT Cancellation Note ? ?Patient Details ?Name: Aaron Stafford ?MRN: [id number] ?DOB: 1934-08-06 ? ? ?Cancelled Treatment:    Reason Eval/Treat Not Completed: OT screened, no needs identified, will sign off. Order received, chart reviewed. Pt back to baseline functional independence. No skilled OT needs identified. Will sign off. Please re-consult if additional needs arise.  ? ?Dessie Coma, M.S. OTR/L  ?06/12/21, 3:53 PM  ?ascom [phone] ? ?

## 2021-06-12 NOTE — Assessment & Plan Note (Signed)
-   IV hydralazine prn for SBP> 220 or dBP> 110 ?-Hold home blood pressure medications ?

## 2021-06-12 NOTE — Assessment & Plan Note (Signed)
Pravastatin  

## 2021-06-13 DIAGNOSIS — R4701 Aphasia: Secondary | ICD-10-CM

## 2021-06-13 DIAGNOSIS — I639 Cerebral infarction, unspecified: Secondary | ICD-10-CM | POA: Diagnosis not present

## 2021-06-13 DIAGNOSIS — E785 Hyperlipidemia, unspecified: Secondary | ICD-10-CM | POA: Diagnosis not present

## 2021-06-13 DIAGNOSIS — W19XXXA Unspecified fall, initial encounter: Secondary | ICD-10-CM | POA: Diagnosis not present

## 2021-06-13 DIAGNOSIS — I2510 Atherosclerotic heart disease of native coronary artery without angina pectoris: Secondary | ICD-10-CM | POA: Diagnosis not present

## 2021-06-13 DIAGNOSIS — I5032 Chronic diastolic (congestive) heart failure: Secondary | ICD-10-CM

## 2021-06-13 DIAGNOSIS — N401 Enlarged prostate with lower urinary tract symptoms: Secondary | ICD-10-CM | POA: Diagnosis not present

## 2021-06-13 DIAGNOSIS — I63212 Cerebral infarction due to unspecified occlusion or stenosis of left vertebral arteries: Secondary | ICD-10-CM | POA: Diagnosis not present

## 2021-06-13 DIAGNOSIS — N138 Other obstructive and reflux uropathy: Secondary | ICD-10-CM | POA: Diagnosis not present

## 2021-06-13 DIAGNOSIS — G459 Transient cerebral ischemic attack, unspecified: Secondary | ICD-10-CM | POA: Diagnosis not present

## 2021-06-13 DIAGNOSIS — I2583 Coronary atherosclerosis due to lipid rich plaque: Secondary | ICD-10-CM

## 2021-06-13 DIAGNOSIS — I10 Essential (primary) hypertension: Secondary | ICD-10-CM | POA: Diagnosis not present

## 2021-06-13 DIAGNOSIS — E1140 Type 2 diabetes mellitus with diabetic neuropathy, unspecified: Secondary | ICD-10-CM | POA: Diagnosis not present

## 2021-06-13 LAB — URINALYSIS, ROUTINE W REFLEX MICROSCOPIC
Bilirubin Urine: NEGATIVE
Glucose, UA: NEGATIVE mg/dL
Hgb urine dipstick: NEGATIVE
Ketones, ur: NEGATIVE mg/dL
Leukocytes,Ua: NEGATIVE
Nitrite: NEGATIVE
Protein, ur: NEGATIVE mg/dL
Specific Gravity, Urine: 1.008 (ref 1.005–1.030)
pH: 6 (ref 5.0–8.0)

## 2021-06-13 LAB — LIPID PANEL
Cholesterol: 116 mg/dL (ref 0–200)
HDL: 28 mg/dL — ABNORMAL LOW (ref >40)
LDL Cholesterol: 46 mg/dL (ref 0–99)
Total CHOL/HDL Ratio: 4.1 RATIO
Triglycerides: 208 mg/dL — ABNORMAL HIGH (ref <150)
VLDL: 42 mg/dL — ABNORMAL HIGH (ref 0–40)

## 2021-06-13 LAB — HEMOGLOBIN A1C
Hgb A1c MFr Bld: 6.9 % — ABNORMAL HIGH (ref 4.8–5.6)
Mean Plasma Glucose: 151.33 mg/dL

## 2021-06-13 LAB — GLUCOSE, CAPILLARY: Glucose-Capillary: 131 mg/dL — ABNORMAL HIGH (ref 70–99)

## 2021-06-13 MED ORDER — PRAVASTATIN SODIUM 20 MG PO TABS: 40.0000 mg | ORAL_TABLET | Freq: Every day | ORAL | Status: DC

## 2021-06-13 MED ORDER — CLOPIDOGREL BISULFATE 75 MG PO TABS: 75.0000 mg | ORAL_TABLET | Freq: Every day | ORAL | 0 refills | Status: DC

## 2021-06-13 MED ORDER — ATORVASTATIN CALCIUM 40 MG PO TABS: 40.0000 mg | ORAL_TABLET | Freq: Every day | ORAL | 0 refills | Status: DC

## 2021-06-13 NOTE — Progress Notes (Signed)
SLP Cancellation Note ? ?Patient Details ?Name: Aaron Stafford ?MRN: [id number] ?DOB: [birth year] ? ? ?Cancelled treatment:       Reason Eval/Treat Not Completed: SLP screened, no needs identified, will sign off. Pt seen for cognitive communication screen. Pt independently oriented, participated in expressive/receptive communication for conversation. Pt's speech was clear and intelligible for duration of conversation. Pt reports that speech and cognitive are at baseline. Family present in room and denied cognitive concerns. NT denied concern for cognitive communication ability. Education shared regarding reaching out to primary care/neurology for speech concerns resurface. Pt endorsed understanding. Based on screen, no acute speech therapy needs identified.  ? ?[place] J Clapp MS CCC-SLP ? ? ?[place] J Clapp ?06/13/2021, 9:53 AM ?

## 2021-06-13 NOTE — Progress Notes (Signed)
Error - see discharge summary  ?

## 2021-06-13 NOTE — Discharge Summary (Signed)
?Physician Discharge Summary ?  ?Patient: Aaron Stafford MRN: [id number] DOB: [birth year]  ?Admit date:     06/12/2021  ?Discharge date: 06/13/21  ?Discharge Physician: Emeterio Reeve  ? ?PCP: Ria Bush, MD  ? ?Recommendations at discharge:  ? Follow w/ neurology as directed ?DAPT x 90 days (mid-July 2023) then just Plavix  ?Follow w/ PCP in 1-2 weeks, will need check on LFT's in 4-6 weeks after starting new statin Rx  ? ?Discharge Diagnoses: ?Principal Problem: ?  Stroke [facility]) ?Active Problems: ?  HTN (hypertension) ?  BPH with obstruction/lower urinary tract symptoms ?  Type 2 diabetes mellitus with diabetic neuropathy, unspecified ([place]) ?  HLD (hyperlipidemia) ?  Fall ?  CAD (coronary artery disease) ?  Chronic diastolic CHF (congestive heart failure) ([place]) ?  Expressive aphasia ?  TIA (transient ischemic attack) ? ?Resolved Problems: ?  * No resolved hospital problems. * ? ?Hospital Course: ?Aaron Stafford is a 87 y.o. male with medical history significant of hypertension, hyperlipidemia, diabetes mellitus, GERD, depression, CAD, CABG, stent placement, BPH, dCHF, shingles, who presents with difficulty speaking 06/12/21. Patient states that he fell accidentally when he was moving furniture at home at about 9 PM last night.  He had a small injury to the back of his head, but denies head or neck pain.  No loss of consciousness.  This morning at about 6 AM, he started having difficulty speaking.  He also had weakness in both legs.  No numbness or tinglings in extremities.  No facial droop, hearing loss or vision loss. Patient denies chest pain, cough, shortness breath.  No fever or chills.  No nausea, vomiting, diarrhea or abdominal pain.  No symptoms of UTI.  Patient's symptom of difficulty speaking has improved at arrival to ED, but still has very little bit of difficulty speaking, MRI of brain and MRA showed acute small cerebellar stroke. Dr. Quinn Axe recommended to keep patient on DAPT for 90 days given  history of vertebral occlusion (aspirin 81 mg daily plus Plavix 75 mg daily), followed by Plavix 75 mg daily after that. ? ?Consultants:  ?Neurology recommends DAPT x 90 days  ?PT/OT have not identified significant needs ? ? ? ?ASSESSMENT & PLAN  ? ?Stroke [facility]) ?Patient's presenting symptoms on 06/12/21 were concerning for possible TIA versus small stroke.   ?CT head was negative on admission.  ?Dr. Quinn Axe of neurology was consulted 06/12/21 ?Placed on tele med bed for observation ?MRI of brain and MRA head and neck : SEE RESULTS BELOW ?Held oral Bp meds to allow permissive HTN in the setting of acute stroke, for SBP>220 or dBP>110 ?ASA + Plavix x 90 days then Plavix alone after that ?Pravastatin to transition to high potency statin for secondary prevention  ?fasting lipid panel and HbA1c resulted, no serious concerns  ?2D transthoracic echocardiography: SEE RESULTS BELOW  ?swallowing screen ?PT/OT consult ? ?MRI of brain and MRA showed acute small cerebellar stroke. Dr. Quinn Axe recommended to keep patient on DAPT for 90 days given history of vertebral occlusion (aspirin 81 mg daily plus Plavix 75 mg daily), followed by Plavix 75 mg daily after that. ? ?MRI/MRA ?1. Small acute infarct left cerebellar tonsil. The left vertebral ?artery is occluded at the origin. There is a severe stenosis in the ?left V4 segment. Left PICA not visualized. ?2. Mild stenosis proximal and distal right vertebral artery.  But ?3. Both carotid arteries are patent to the skull base without ?significant stenosis. ?4. Atrophy and mild to moderate chronic  microvascular ischemia. ? ?2D echo showed EF of 50-55% with grade 2 diastolic dysfunction, also showed some aortic valve abnormalities. Echodensity along the ventricular aspect of the aortic valve is seen on the parasternal long axis images and is similar in appearance to study  from 03/22/2021. Clinical significance in regard to the patient's TIA is uncertain. Per Dr. Quinn Axe, pt will not need  TEE. Pt can be discharged today and follow-up with neurology. ? ? ? ? ?HTN (hypertension) ?- IV hydralazine prn for SBP> 220 or dBP> 110 ?-Hold home blood pressure medications ? ?BPH with obstruction/lower urinary tract symptoms ?- Continue Proscar ? ?Type 2 diabetes mellitus with diabetic neuropathy, unspecified ([place]) ?Recent A1c 6.7, well controlled.  Patient is taking metformin ?-Sliding scale insulin ? ?HLD (hyperlipidemia) ?- Pravastatin ? ?Fall ?- Fall precaution ?-PT/OT ? ?CAD (coronary artery disease) ?S/p of CABG 1995, s/p of stent placement, no chest pain. ?-Aspirin, pravastatin ?-Imdur ? ?Chronic diastolic CHF (congestive heart failure) ([place]) ?2D echo on 03/22/2021 showed EF of 50-55% with grade 1 diastolic dysfunction.  Patient does not have leg edema or JVD.  No shortness of breath.  CHF seem to be compensated. ?-Hold Lasix in the setting of acute TIA or small stroke ? ? ? ? ? ? ? ? ? ? ? ?Consultants: neurology ?Procedures performed: none  ?Disposition: Home ?Diet recommendation:  ?Discharge Diet Orders (From admission, onward)  ? ?  Start     Ordered  ? 06/13/21 0000  Diet - low sodium heart healthy       ? 06/13/21 0948  ? ?  ?  ? ?  ? ?Cardiac and Carb modified diet ?DISCHARGE MEDICATION: ?Allergies as of 06/13/2021   ? ?   Reactions  ? Morphine Sulfate   ? REACTION: Hallucinations  ? Nitroglycerin   ? REACTION: Decreased BP  ? Contrast Media [iodinated Contrast Media] Rash  ? Severe hives  ? ?  ? ?  ?Medication List  ?  ? ?STOP taking these medications   ? ?diclofenac Sodium 1 % Gel ?Commonly known as: VOLTAREN ?  ?pravastatin 40 MG tablet ?Commonly known as: PRAVACHOL ?  ?valACYclovir 1000 MG tablet ?Commonly known as: VALTREX ?  ?vitamin B-12 250 MCG tablet ?Commonly known as: CYANOCOBALAMIN ?  ? ?  ? ?TAKE these medications   ? ?Alpha-Lipoic Acid 600 MG Caps ?Take 1 capsule (600 mg total) by mouth 2 (two) times daily. ?  ?aspirin 81 MG tablet ?Take 81 mg by mouth daily. ?  ?atorvastatin 40 MG  tablet ?Commonly known as: Lipitor ?Take 1 tablet (40 mg total) by mouth daily. ?  ?clopidogrel 75 MG tablet ?Commonly known as: PLAVIX ?Take 1 tablet (75 mg total) by mouth daily. ?Start taking on: June 14, 2021 ?  ?Cranberry 500 MG Caps ?Take 2 capsules (1,000 mg total) by mouth daily. ?  ?DULoxetine 30 MG capsule ?Commonly known as: CYMBALTA ?TAKE 1 CAPSULE BY MOUTH EVERY DAY ?What changed: Another medication with the same name was removed. Continue taking this medication, and follow the directions you see here. ?  ?finasteride 5 MG tablet ?Commonly known as: PROSCAR ?TAKE 1 TABLET BY MOUTH EVERY DAY ?  ?fluticasone 50 MCG/ACT nasal spray ?Commonly known as: FLONASE ?SPRAY 2 SPRAYS INTO EACH NOSTRIL EVERY DAY ?  ?furosemide 20 MG tablet ?Commonly known as: LASIX ?TAKE 1 TABLET BY MOUTH EVERY DAY ?  ?gabapentin 300 MG capsule ?Commonly known as: NEURONTIN ?TAKE 2 CAPSULES BY MOUTH TWICE A DAY ?  ?hydrochlorothiazide  25 MG tablet ?Commonly known as: HYDRODIURIL ?TAKE 1 TABLET BY MOUTH EVERY DAY ?  ?isosorbide mononitrate 30 MG 24 hr tablet ?Commonly known as: IMDUR ?Take 0.5 tablets (15 mg total) by mouth daily. ?  ?losartan 50 MG tablet ?Commonly known as: COZAAR ?TAKE 1 TABLET BY MOUTH EVERY DAY ?  ?metFORMIN 500 MG tablet ?Commonly known as: GLUCOPHAGE ?TAKE 1 TABLET BY MOUTH 2 TIMES DAILY WITH A MEAL. ?  ?OneTouch Delica Lancets 76H Misc ?Check blood sugar once daily ?  ?OneTouch Verio Flex System w/Device Kit ?Check blood sugar once daily ?  ?OneTouch Verio test strip ?Generic drug: glucose blood ?Check blood sugar once daily ?  ?polyethylene glycol powder 17 GM/SCOOP powder ?Commonly known as: GLYCOLAX/MIRALAX ?TAKE 8.5-17 G BY MOUTH DAILY AS NEEDED FOR MODERATE CONSTIPATION. ?  ?traZODone 50 MG tablet ?Commonly known as: DESYREL ?TAKE 1 tablet BY MOUTH AT BEDTIME AS NEEDED FOR SLEEP ?  ?Vitamin D3 25 MCG (1000 UT) Caps ?Take 1 capsule (1,000 Units total) by mouth daily. ?  ? ?  ? ? ?Discharge Exam: ?Filed  Weights  ? 06/12/21 0741  ?Weight: 81.2 kg  ? ?Constitutional:  ?VSS, see nurse notes ?General Appearance: alert, well-developed, well-nourished, NAD ?Eyes: ?Normal lids and conjunctive, non-icteric sclera ?PERR

## 2021-06-27 ENCOUNTER — Other Ambulatory Visit: Payer: Self-pay

## 2021-06-27 ENCOUNTER — Encounter: Payer: Self-pay | Admitting: Emergency Medicine

## 2021-06-27 ENCOUNTER — Inpatient Hospital Stay
Admission: EM | Admit: 2021-06-27 | Discharge: 2021-06-29 | DRG: 065 | Disposition: A | Discharge status: Home / Self Care | Payer: Medicare Other | Attending: Hospitalist | Admitting: Hospitalist

## 2021-06-27 ENCOUNTER — Emergency Department: Payer: Medicare Other

## 2021-06-27 DIAGNOSIS — Z7982 Long term (current) use of aspirin: Secondary | ICD-10-CM

## 2021-06-27 DIAGNOSIS — I5032 Chronic diastolic (congestive) heart failure: Secondary | ICD-10-CM | POA: Diagnosis present

## 2021-06-27 DIAGNOSIS — I10 Essential (primary) hypertension: Secondary | ICD-10-CM | POA: Insufficient documentation

## 2021-06-27 DIAGNOSIS — N400 Enlarged prostate without lower urinary tract symptoms: Secondary | ICD-10-CM | POA: Diagnosis present

## 2021-06-27 DIAGNOSIS — Z85820 Personal history of malignant melanoma of skin: Secondary | ICD-10-CM

## 2021-06-27 DIAGNOSIS — G319 Degenerative disease of nervous system, unspecified: Secondary | ICD-10-CM | POA: Diagnosis not present

## 2021-06-27 DIAGNOSIS — I639 Cerebral infarction, unspecified: Secondary | ICD-10-CM | POA: Diagnosis not present

## 2021-06-27 DIAGNOSIS — Z955 Presence of coronary angioplasty implant and graft: Secondary | ICD-10-CM

## 2021-06-27 DIAGNOSIS — Z8616 Personal history of COVID-19: Secondary | ICD-10-CM

## 2021-06-27 DIAGNOSIS — Z66 Do not resuscitate: Secondary | ICD-10-CM | POA: Diagnosis present

## 2021-06-27 DIAGNOSIS — E1142 Type 2 diabetes mellitus with diabetic polyneuropathy: Secondary | ICD-10-CM | POA: Diagnosis present

## 2021-06-27 DIAGNOSIS — E785 Hyperlipidemia, unspecified: Secondary | ICD-10-CM | POA: Diagnosis present

## 2021-06-27 DIAGNOSIS — Z8673 Personal history of transient ischemic attack (TIA), and cerebral infarction without residual deficits: Secondary | ICD-10-CM | POA: Diagnosis not present

## 2021-06-27 DIAGNOSIS — D72829 Elevated white blood cell count, unspecified: Secondary | ICD-10-CM | POA: Diagnosis present

## 2021-06-27 DIAGNOSIS — Z9049 Acquired absence of other specified parts of digestive tract: Secondary | ICD-10-CM | POA: Diagnosis not present

## 2021-06-27 DIAGNOSIS — Z91041 Radiographic dye allergy status: Secondary | ICD-10-CM

## 2021-06-27 DIAGNOSIS — I63542 Cerebral infarction due to unspecified occlusion or stenosis of left cerebellar artery: Secondary | ICD-10-CM | POA: Diagnosis present

## 2021-06-27 DIAGNOSIS — J310 Chronic rhinitis: Secondary | ICD-10-CM | POA: Diagnosis present

## 2021-06-27 DIAGNOSIS — Z951 Presence of aortocoronary bypass graft: Secondary | ICD-10-CM

## 2021-06-27 DIAGNOSIS — I110 Hypertensive heart disease with heart failure: Secondary | ICD-10-CM | POA: Diagnosis present

## 2021-06-27 DIAGNOSIS — Z8249 Family history of ischemic heart disease and other diseases of the circulatory system: Secondary | ICD-10-CM | POA: Diagnosis not present

## 2021-06-27 DIAGNOSIS — Z7984 Long term (current) use of oral hypoglycemic drugs: Secondary | ICD-10-CM

## 2021-06-27 DIAGNOSIS — E1140 Type 2 diabetes mellitus with diabetic neuropathy, unspecified: Secondary | ICD-10-CM | POA: Diagnosis present

## 2021-06-27 DIAGNOSIS — R42 Dizziness and giddiness: Secondary | ICD-10-CM | POA: Diagnosis not present

## 2021-06-27 DIAGNOSIS — I252 Old myocardial infarction: Secondary | ICD-10-CM

## 2021-06-27 DIAGNOSIS — I2510 Atherosclerotic heart disease of native coronary artery without angina pectoris: Secondary | ICD-10-CM | POA: Diagnosis present

## 2021-06-27 DIAGNOSIS — Z885 Allergy status to narcotic agent status: Secondary | ICD-10-CM

## 2021-06-27 DIAGNOSIS — Z8619 Personal history of other infectious and parasitic diseases: Secondary | ICD-10-CM | POA: Diagnosis not present

## 2021-06-27 DIAGNOSIS — Z888 Allergy status to other drugs, medicaments and biological substances status: Secondary | ICD-10-CM

## 2021-06-27 DIAGNOSIS — Z79899 Other long term (current) drug therapy: Secondary | ICD-10-CM

## 2021-06-27 DIAGNOSIS — I6502 Occlusion and stenosis of left vertebral artery: Secondary | ICD-10-CM | POA: Diagnosis not present

## 2021-06-27 DIAGNOSIS — K219 Gastro-esophageal reflux disease without esophagitis: Secondary | ICD-10-CM | POA: Diagnosis present

## 2021-06-27 DIAGNOSIS — K760 Fatty (change of) liver, not elsewhere classified: Secondary | ICD-10-CM | POA: Diagnosis present

## 2021-06-27 DIAGNOSIS — Z9181 History of falling: Secondary | ICD-10-CM

## 2021-06-27 DIAGNOSIS — Z7902 Long term (current) use of antithrombotics/antiplatelets: Secondary | ICD-10-CM

## 2021-06-27 LAB — BASIC METABOLIC PANEL WITH GFR
Anion gap: 12 (ref 5–15)
BUN: 32 mg/dL — ABNORMAL HIGH (ref 8–23)
CO2: 30 mmol/L (ref 22–32)
Calcium: 10.1 mg/dL (ref 8.9–10.3)
Chloride: 95 mmol/L — ABNORMAL LOW (ref 98–111)
Creatinine, Ser: 1.12 mg/dL (ref 0.61–1.24)
GFR, Estimated: >60 mL/min (ref >60)
Glucose, Bld: 194 mg/dL — ABNORMAL HIGH (ref 70–99)
Potassium: 3.3 mmol/L — ABNORMAL LOW (ref 3.5–5.1)
Sodium: 137 mmol/L (ref 135–145)

## 2021-06-27 LAB — CBC
HCT: 41.8 % (ref 39.0–52.0)
Hemoglobin: 13.9 g/dL (ref 13.0–17.0)
MCH: 29.7 pg (ref 26.0–34.0)
MCHC: 33.3 g/dL (ref 30.0–36.0)
MCV: 89.3 fL (ref 80.0–100.0)
Platelets: 269 10*3/uL (ref 150–400)
RBC: 4.68 MIL/uL (ref 4.22–5.81)
RDW: 12.8 % (ref 11.5–15.5)
WBC: 13.5 10*3/uL — ABNORMAL HIGH (ref 4.0–10.5)
nRBC: 0 % (ref 0.0–0.2)

## 2021-06-27 LAB — CBG MONITORING, ED: Glucose-Capillary: 219 mg/dL — ABNORMAL HIGH (ref 70–99)

## 2021-06-27 LAB — PROTIME-INR
INR: 1.2 (ref 0.8–1.2)
Prothrombin Time: 14.8 seconds (ref 11.4–15.2)

## 2021-06-27 LAB — APTT: aPTT: 48 seconds — ABNORMAL HIGH (ref 24–36)

## 2021-06-27 IMAGING — MR MR HEAD W/O CM
11 series · 45 of 48 positions shown · non-contrast
Comparison: Prior study from [DATE].

CLINICAL DATA: Initial evaluation for acute dizziness.

EXAM:
MRI HEAD WITHOUT CONTRAST
TECHNIQUE: Multiplanar, multiecho pulse sequences of the brain and surrounding
structures were obtained without intravenous contrast.

[Series 5: ax dwi_tracew · axial · 3.0mm · 0.65mm/px · z∈[-63,+92]mm · 4 of 48 slices shown]
[im 1/48]
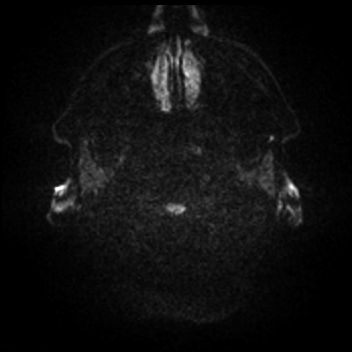
[im 16/48]
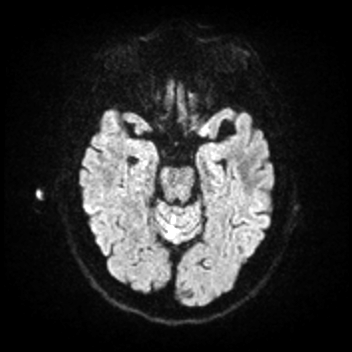
[im 32/48]
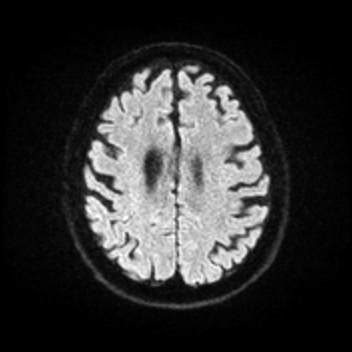
[im 48/48]
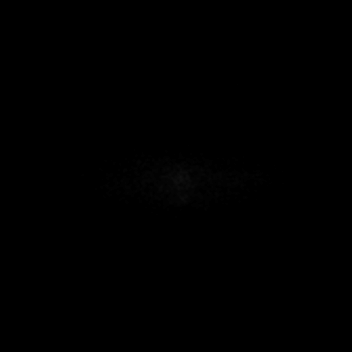

[Series 6: ax dwi_adc · axial · 3.0mm · 0.65mm/px · z∈[-63,+89]mm · 4 of 47 slices shown]
[im 1/47]
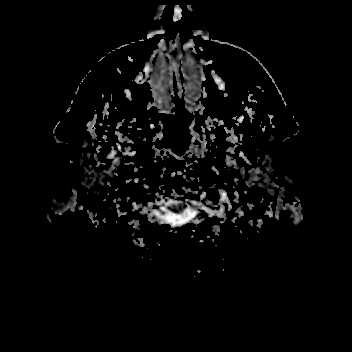
[im 16/47]
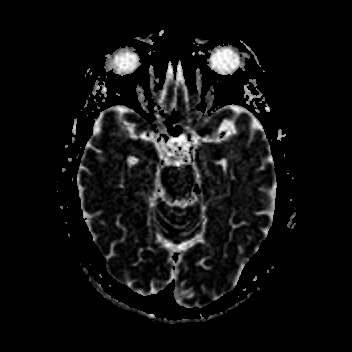
[im 31/47]
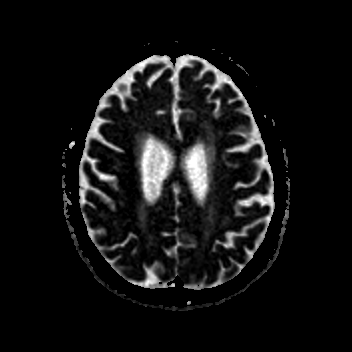
[im 47/47]
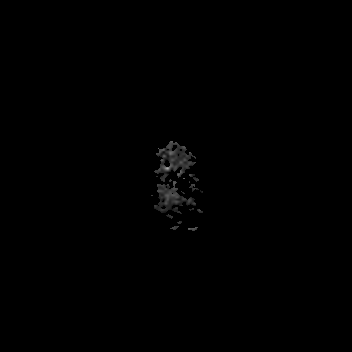

[Series 7: cor dwi_tracew · coronal · 5.0mm · 0.65mm/px · 3 of 40 slices shown]
[im 1/40]
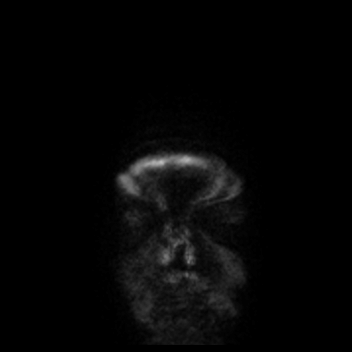
[im 20/40]
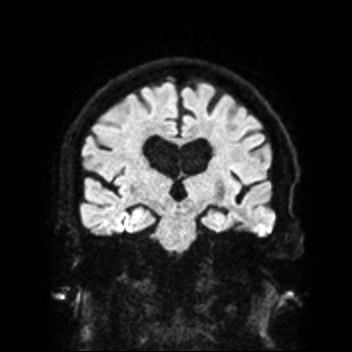
[im 40/40]
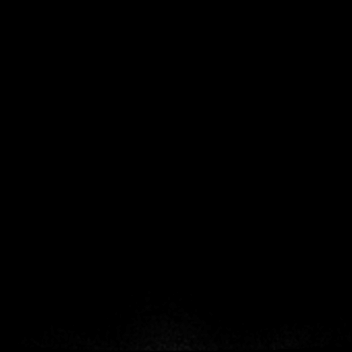

[Series 8: cor dwi_adc · coronal · 5.0mm · 0.65mm/px · 3 of 38 slices shown]
[im 1/38]
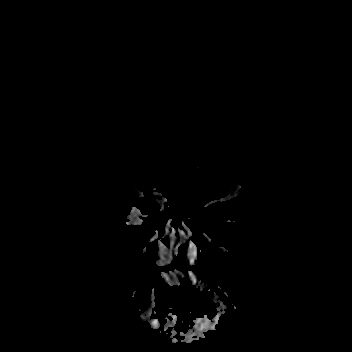
[im 19/38]
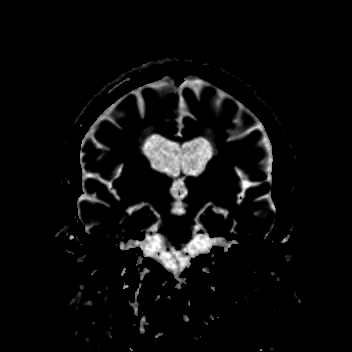
[im 38/38]
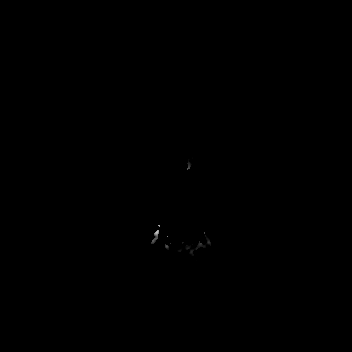

[Series 9: T1 · sagittal · 5.0mm · 0.62mm/px · 2 of 22 slices shown (1 of 2)]
[im 1/22]
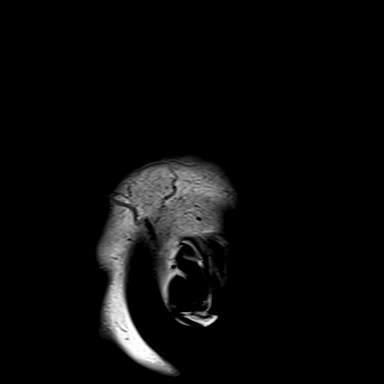
[im 22/22]
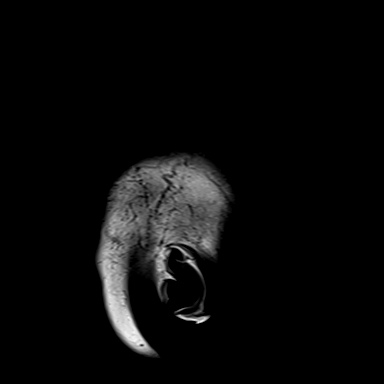

[Series 15: T2 · axial · 5.0mm · 0.53mm/px · z∈[-67,+95]mm · 2 of 28 slices shown (1 of 2)]
[im 1/28]
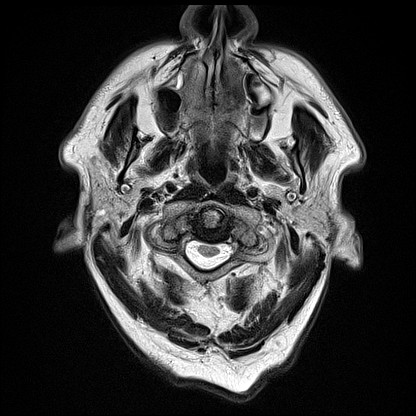
[im 28/28]
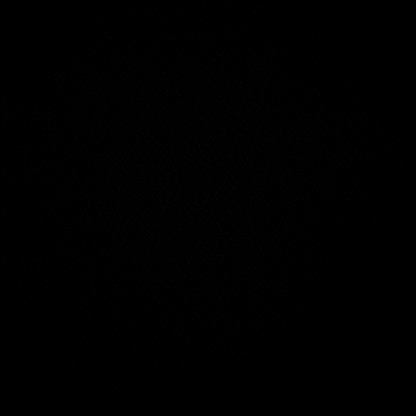

[Series 17: pha_images · axial · 3.0mm · 0.90mm/px · z∈[-74,+103]mm · 5 of 57 slices shown]
[im 1/57]
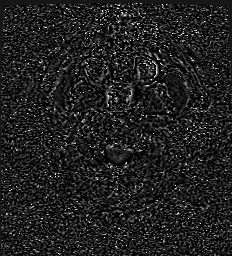
[im 15/57]
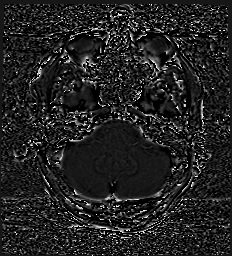
[im 29/57]
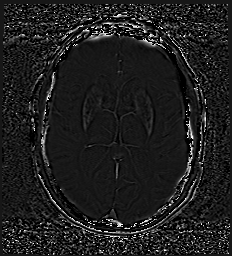
[im 43/57]
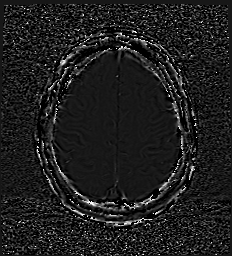
[im 57/57]
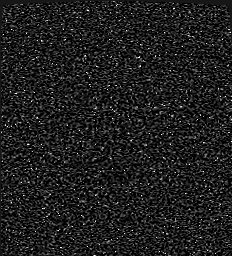

[Series 18: swi_images · axial · 3.0mm · 0.90mm/px · z∈[-74,+103]mm · 5 of 60 slices shown]
[im 1/60]
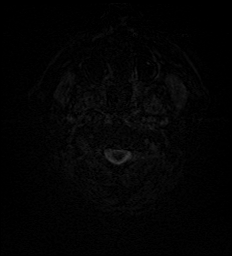
[im 15/60]
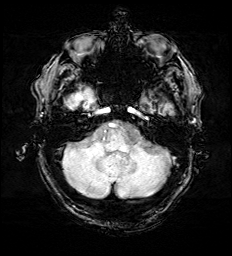
[im 30/60]
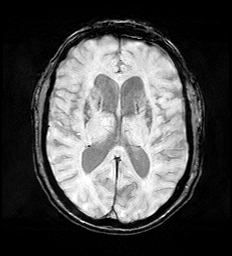
[im 45/60]
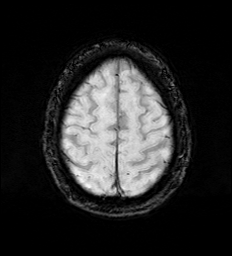
[im 60/60]
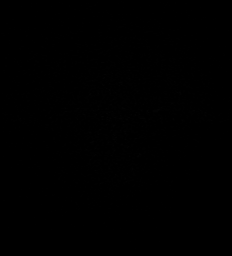

[Series 20: FLAIR · axial · 3.0mm · 0.53mm/px · z∈[-67,+95]mm · 4 of 55 slices shown]
[im 1/55]
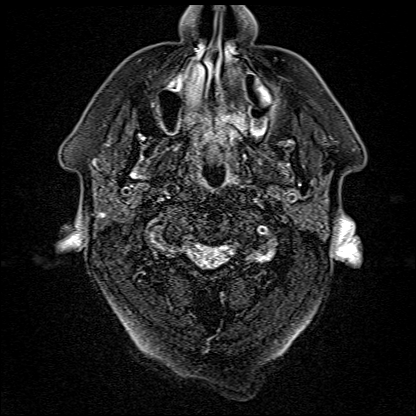
[im 19/55]
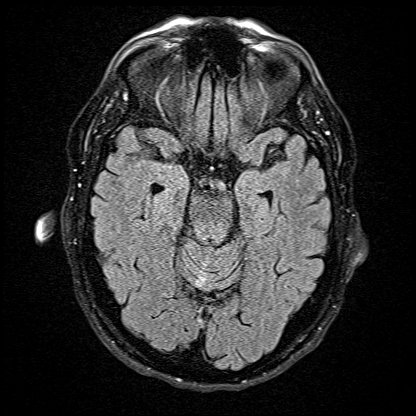
[im 37/55]
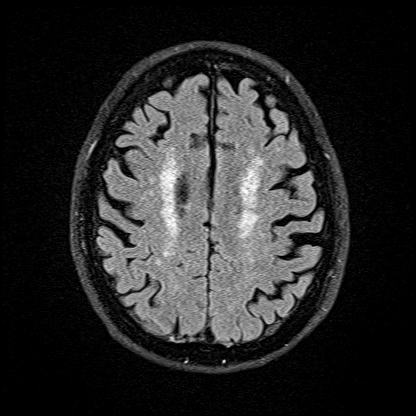
[im 55/55]
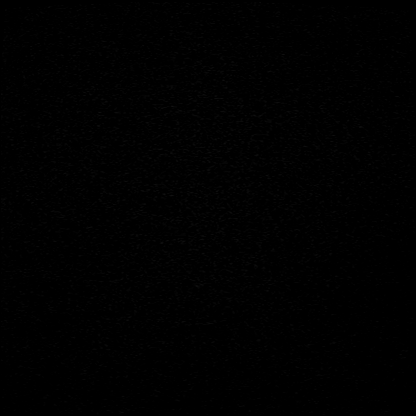

[Series 21: T1 · axial · 1.0mm · 0.98mm/px · z∈[-73,+102]mm · 11 of 176 slices shown (2 of 2)]
[im 1/176]
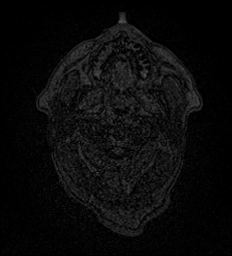
[im 14/176]
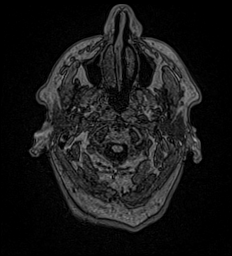
[im 27/176]
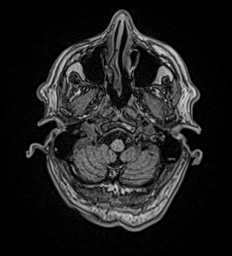
[im 41/176]
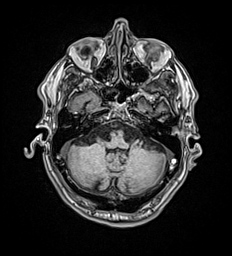
[im 54/176]
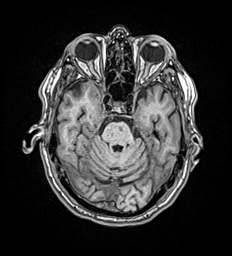
[im 68/176]
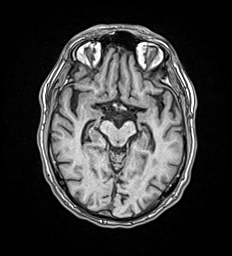
[im 81/176]
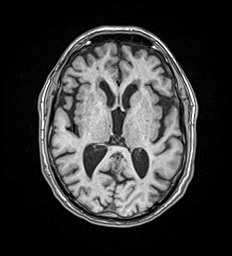
[im 95/176]
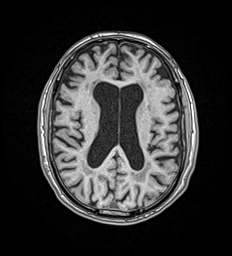
[im 122/176]
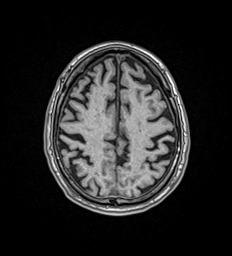
[im 149/176]
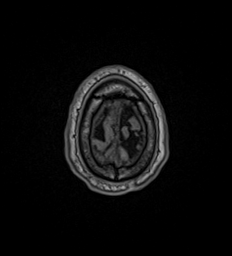
[im 176/176]
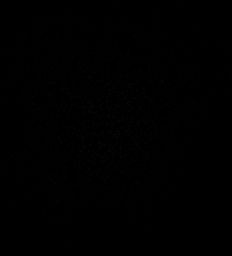

[Series 22: T2 · coronal · 5.0mm · 0.57mm/px · 2 of 29 slices shown (2 of 2)]
[im 1/29]
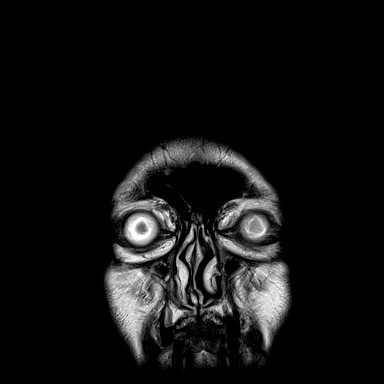
[im 29/29]
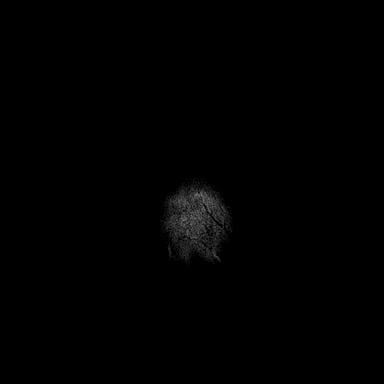

[45 of 48 positions shown; findings below may reference images not displayed]

FINDINGS: Brain: Generalized age-related cerebral atrophy with moderate
chronic microvascular ischemic disease. Small chronic infarct at the
inferior left cerebellar tonsil noted, which was acute on previous
MRI.

1.5 cm linear acute ischemic nonhemorrhagic infarcts seen involving
the paramedian superior right cerebellar vermis (series 5, image
16). No associated hemorrhage or mass effect. No other evidence for
acute or subacute ischemia. Gray-white matter differentiation
otherwise maintained. No other acute or chronic intracranial blood
products.

No mass lesion, midline shift or mass effect. No hydrocephalus or
extra-axial fluid collection. Pituitary gland suprasellar region
within normal limits. Midline structures intact.

Vascular: Irregular flow void within the proximal left V4 segment
related to previously identified stenosis and attenuated flow as
seen on prior MRA. Major intracranial vascular flow voids are
otherwise maintained.

Skull and upper cervical spine: Craniocervical junction within
normal limits. Bone marrow signal intensity normal. No scalp soft
tissue abnormality.

Sinuses/Orbits: Globes and orbital soft tissues demonstrate no acute
finding. Paranasal sinuses are largely clear. No significant mastoid
effusion.

Other: None.
IMPRESSION: 1. 1.5 cm acute ischemic nonhemorrhagic infarct involving the
paramedian superior right cerebellar vermis.
2. Irregular flow void within the proximal left V4 segment,
consistent with stenosis and attenuated flow as seen on previous
MRA.
3. Age-related cerebral atrophy with moderate chronic microvascular
ischemic disease. Small remote infarct at the inferior left
cerebellar tonsil.

## 2021-06-27 NOTE — ED Triage Notes (Signed)
Pt presents via POV with complaints of increased weakness starting tonight. He notes having a CVA 2 weeks ago and has residual weakness and believes he has been over doing causing him to feel weaker. Of note, he mentions having intermittent dizziness today as well. Denies CP or SOB.  ?

## 2021-06-27 NOTE — ED Provider Notes (Signed)
? ?[facility] ?Provider Note ? ? ? Event Date/Time  ? First MD Initiated Contact with Patient 06/27/21 2205   ?  (approximate) ? ? ?History  ? ?Weakness ? ? ?HPI ? ?Aaron Stafford is a 87 y.o. male  who, per discharge summary dated 06/13/21 had admission for CVA, who presents to the emergency department today because of concern for Dizziness.  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  The patient was recently admitted to the hospital for a CVA.  He states he had been doing okay since discharge.  He does not think he has been resting as much as he should.  He has been taking his medications as prescribed.  He denies any fevers, cough. ? ? ?Physical Exam  ? ?Triage Vital Signs: ?ED Triage Vitals  ?Enc Vitals Group  ?   BP 06/27/21 2145 (!) 150/73  ?   Pulse Rate 06/27/21 2145 77  ?   Resp 06/27/21 2145 18  ?   Temp 06/27/21 2145 98.6 ?F (37 ?C)  ?   Temp Source 06/27/21 2145 Oral  ?   SpO2 06/27/21 2145 96 %  ?   Weight 06/27/21 2146 180 lb 12.4 oz (82 kg)  ?   Height 06/27/21 2146 '5\' 8"'$  (1.727 m)  ?   Head Circumference --   ?   Peak Flow --   ?   Pain Score 06/27/21 2146 2  ? ?Most recent vital signs: ?Vitals:  ? 06/27/21 2145 06/27/21 2200  ?BP: (!) 150/73 (!) 156/71  ?Pulse: 77 69  ?Resp: 18 16  ?Temp: 98.6 ?F (37 ?C)   ?SpO2: 96% 94%  ? ? ?General: Awake, alert, oriented. ?CV:  Good peripheral perfusion. Regular rate and rhythm. ?Resp:  Normal effort. Lungs clear to auscultation ?Abd:  No distention.  ?Other:  EOMI. PERRL. Strength 5/5 in upper and lower extremities. Sensation grossly intact.  ? ? ?ED Results / Procedures / Treatments  ? ?Labs ?(all labs ordered are listed, but only abnormal results are displayed) ?Labs Reviewed  ?CBC - Abnormal; Notable for the following components:  ?    Result Value  ? WBC 13.5 (*)   ? All other components within normal limits  ?BASIC METABOLIC PANEL  ?URINALYSIS, ROUTINE W REFLEX MICROSCOPIC   ?APTT  ?PROTIME-INR  ?CBG MONITORING, ED  ? ? ? ?EKG ? ?INance Pear, attending physician, personally viewed and interpreted this EKG ? ?EKG Time: 2148 ?Rate: 74 ?Rhythm: normal sinus rhythm ?Axis: left axis deviation ?Intervals: qtc 461 ?QRS: LAFB, RBBB ?ST changes: no st elevation ?Impression: abnormal ekg ? ? ?RADIOLOGY ?MR pending ? ? ?PROCEDURES: ? ?Critical Care performed: No ? ?Procedures ? ? ?MEDICATIONS ORDERED IN ED: ?Medications - No data to display ? ? ?IMPRESSION / MDM / ASSESSMENT AND PLAN / ED COURSE  ?I reviewed the triage vital signs and the nursing notes. ?             ?               ? ?Differential diagnosis includes, but is not limited to, CVA, recrudescence of previous CVA secondary to infection, anemia. ? ?Patient presents to the emergency department today because of concerns for dizziness.  Patient did have recent cerebellar stroke.  Do have concerns for possible recurrence of stroke so will obtain MRI.  Additionally will check blood work and urine for possible signs of infection  that might of caused worsening of previous stroke symptoms. ? ? ?FINAL CLINICAL IMPRESSION(S) / ED DIAGNOSES  ? ?Dizziness ? ? ?Note:  This document was prepared using Dragon voice recognition software and may include unintentional dictation errors. ? ?  ?Nance Pear, MD ?06/27/21 2314 ? ?

## 2021-06-27 NOTE — ED Notes (Signed)
Patient transported to MRI 

## 2021-06-28 ENCOUNTER — Observation Stay: Payer: Medicare Other

## 2021-06-28 DIAGNOSIS — Z951 Presence of aortocoronary bypass graft: Secondary | ICD-10-CM | POA: Diagnosis not present

## 2021-06-28 DIAGNOSIS — Z885 Allergy status to narcotic agent status: Secondary | ICD-10-CM | POA: Diagnosis not present

## 2021-06-28 DIAGNOSIS — E1142 Type 2 diabetes mellitus with diabetic polyneuropathy: Secondary | ICD-10-CM | POA: Diagnosis present

## 2021-06-28 DIAGNOSIS — D72829 Elevated white blood cell count, unspecified: Secondary | ICD-10-CM | POA: Diagnosis present

## 2021-06-28 DIAGNOSIS — Z9049 Acquired absence of other specified parts of digestive tract: Secondary | ICD-10-CM | POA: Diagnosis not present

## 2021-06-28 DIAGNOSIS — I110 Hypertensive heart disease with heart failure: Secondary | ICD-10-CM | POA: Diagnosis present

## 2021-06-28 DIAGNOSIS — K219 Gastro-esophageal reflux disease without esophagitis: Secondary | ICD-10-CM | POA: Diagnosis present

## 2021-06-28 DIAGNOSIS — Z955 Presence of coronary angioplasty implant and graft: Secondary | ICD-10-CM | POA: Diagnosis not present

## 2021-06-28 DIAGNOSIS — I252 Old myocardial infarction: Secondary | ICD-10-CM | POA: Diagnosis not present

## 2021-06-28 DIAGNOSIS — I639 Cerebral infarction, unspecified: Secondary | ICD-10-CM | POA: Diagnosis not present

## 2021-06-28 DIAGNOSIS — E785 Hyperlipidemia, unspecified: Secondary | ICD-10-CM | POA: Diagnosis present

## 2021-06-28 DIAGNOSIS — J310 Chronic rhinitis: Secondary | ICD-10-CM | POA: Diagnosis present

## 2021-06-28 DIAGNOSIS — Z9181 History of falling: Secondary | ICD-10-CM | POA: Diagnosis not present

## 2021-06-28 DIAGNOSIS — Z8673 Personal history of transient ischemic attack (TIA), and cerebral infarction without residual deficits: Secondary | ICD-10-CM | POA: Diagnosis not present

## 2021-06-28 DIAGNOSIS — Z8249 Family history of ischemic heart disease and other diseases of the circulatory system: Secondary | ICD-10-CM | POA: Diagnosis not present

## 2021-06-28 DIAGNOSIS — Z66 Do not resuscitate: Secondary | ICD-10-CM | POA: Diagnosis present

## 2021-06-28 DIAGNOSIS — N400 Enlarged prostate without lower urinary tract symptoms: Secondary | ICD-10-CM | POA: Diagnosis present

## 2021-06-28 DIAGNOSIS — R42 Dizziness and giddiness: Secondary | ICD-10-CM | POA: Diagnosis present

## 2021-06-28 DIAGNOSIS — Z8616 Personal history of COVID-19: Secondary | ICD-10-CM | POA: Diagnosis not present

## 2021-06-28 DIAGNOSIS — I6502 Occlusion and stenosis of left vertebral artery: Secondary | ICD-10-CM | POA: Diagnosis not present

## 2021-06-28 DIAGNOSIS — I63542 Cerebral infarction due to unspecified occlusion or stenosis of left cerebellar artery: Secondary | ICD-10-CM | POA: Diagnosis present

## 2021-06-28 DIAGNOSIS — Z8619 Personal history of other infectious and parasitic diseases: Secondary | ICD-10-CM | POA: Diagnosis not present

## 2021-06-28 DIAGNOSIS — Z85820 Personal history of malignant melanoma of skin: Secondary | ICD-10-CM | POA: Diagnosis not present

## 2021-06-28 DIAGNOSIS — I5032 Chronic diastolic (congestive) heart failure: Secondary | ICD-10-CM | POA: Diagnosis present

## 2021-06-28 DIAGNOSIS — I2510 Atherosclerotic heart disease of native coronary artery without angina pectoris: Secondary | ICD-10-CM | POA: Diagnosis present

## 2021-06-28 DIAGNOSIS — Z91041 Radiographic dye allergy status: Secondary | ICD-10-CM | POA: Diagnosis not present

## 2021-06-28 DIAGNOSIS — K760 Fatty (change of) liver, not elsewhere classified: Secondary | ICD-10-CM | POA: Diagnosis present

## 2021-06-28 LAB — URINALYSIS, ROUTINE W REFLEX MICROSCOPIC
Bacteria, UA: NONE SEEN
Bilirubin Urine: NEGATIVE
Glucose, UA: 50 mg/dL — AB
Hgb urine dipstick: NEGATIVE
Ketones, ur: NEGATIVE mg/dL
Leukocytes,Ua: NEGATIVE
Nitrite: NEGATIVE
Protein, ur: 30 mg/dL — AB
Specific Gravity, Urine: 1.021 (ref 1.005–1.030)
pH: 6 (ref 5.0–8.0)

## 2021-06-28 LAB — CBC
HCT: 39.2 % (ref 39.0–52.0)
Hemoglobin: 13.1 g/dL (ref 13.0–17.0)
MCH: 29.6 pg (ref 26.0–34.0)
MCHC: 33.4 g/dL (ref 30.0–36.0)
MCV: 88.7 fL (ref 80.0–100.0)
Platelets: 249 10*3/uL (ref 150–400)
RBC: 4.42 MIL/uL (ref 4.22–5.81)
RDW: 12.8 % (ref 11.5–15.5)
WBC: 11.8 10*3/uL — ABNORMAL HIGH (ref 4.0–10.5)
nRBC: 0 % (ref 0.0–0.2)

## 2021-06-28 LAB — CBG MONITORING, ED
Glucose-Capillary: 152 mg/dL — ABNORMAL HIGH (ref 70–99)
Glucose-Capillary: 155 mg/dL — ABNORMAL HIGH (ref 70–99)

## 2021-06-28 LAB — PLATELET FUNCTION ASSAY
Collagen / ADP: 142 seconds — ABNORMAL HIGH (ref 0–118)
Collagen / Epinephrine: 197 seconds — ABNORMAL HIGH (ref 0–193)

## 2021-06-28 LAB — CREATININE, SERUM
Creatinine, Ser: 0.94 mg/dL (ref 0.61–1.24)
GFR, Estimated: >60 mL/min (ref >60)

## 2021-06-28 LAB — GLUCOSE, CAPILLARY
Glucose-Capillary: 143 mg/dL — ABNORMAL HIGH (ref 70–99)
Glucose-Capillary: 146 mg/dL — ABNORMAL HIGH (ref 70–99)

## 2021-06-28 IMAGING — MR MR MRA NECK WO/W CM
3 of 4 series · 21 of 48 positions shown · IV contrast (gadavist)
Comparison: [DATE]

CLINICAL DATA: Carotid artery stenosis screening, concern for
dissection, acute infarct on [DATE]

EXAM:
MRA NECK WITHOUT AND WITH CONTRAST
MRA HEAD WITHOUT CONTRAST
TECHNIQUE: Multiplanar and multiecho pulse sequences of the neck were obtained
without and with intravenous contrast. Angiographic images of the
neck were obtained using MRA technique without and with intravenous
contast.; Angiographic images of the Circle of Willis were obtained
using MRA technique without intravenous contrast.
CONTRAST:  7.5mL GADAVIST GADOBUTROL 1 MMOL/ML IV SOLN

[Series 19: angio_fl3d_cor_post_ttc=2.0s · coronal · 0.9mm · 0.85mm/px · 18 of 96 slices shown]
[im 1/96]
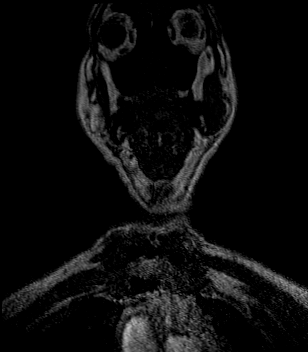
[im 6/96]
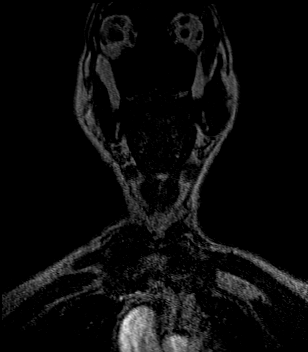
[im 12/96]
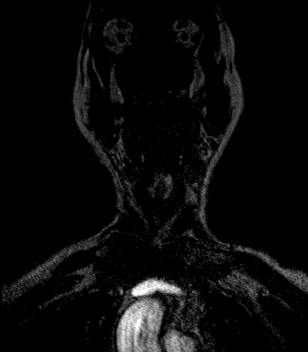
[im 17/96]
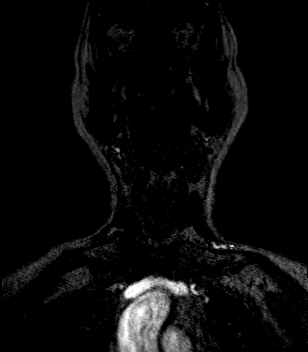
[im 23/96]
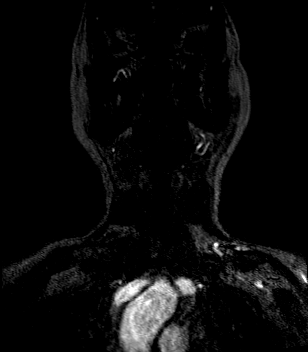
[im 28/96]
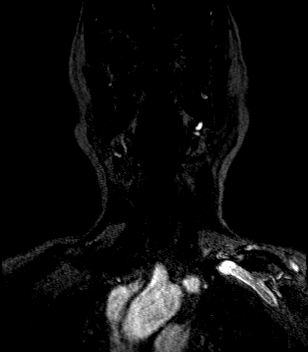
[im 34/96]
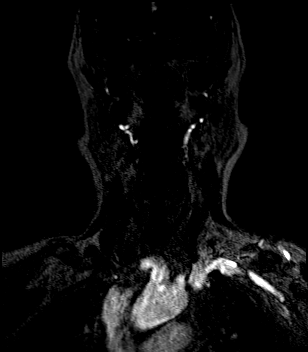
[im 40/96]
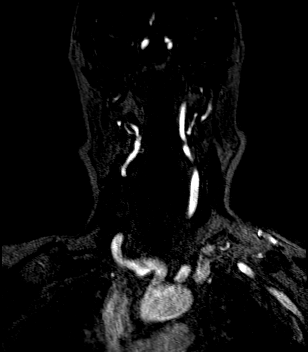
[im 45/96]
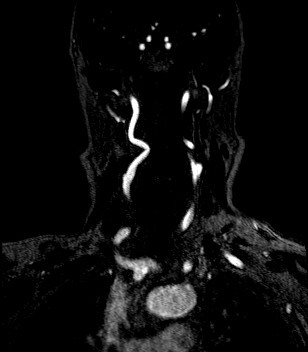
[im 51/96]
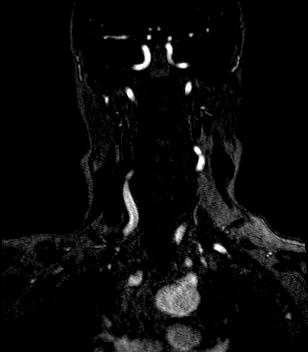
[im 56/96]
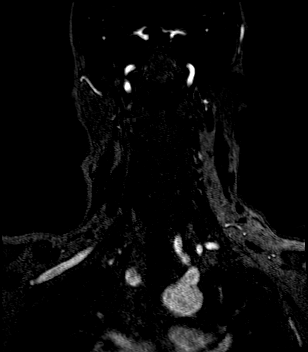
[im 62/96]
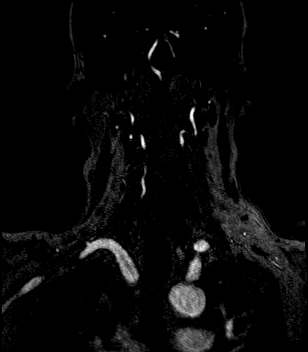
[im 68/96]
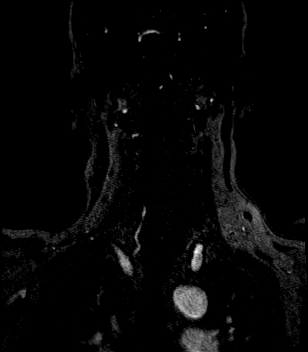
[im 73/96]
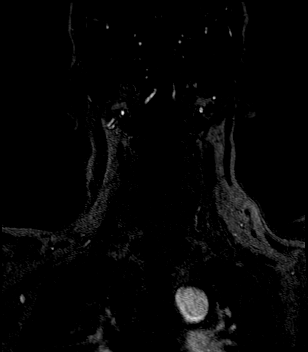
[im 79/96]
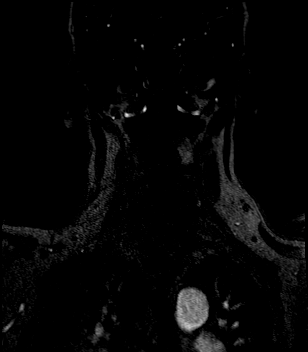
[im 84/96]
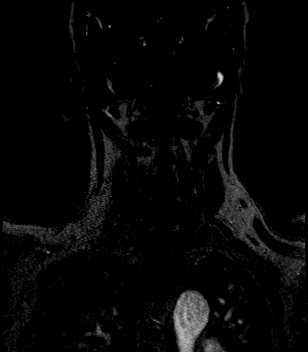
[im 90/96]
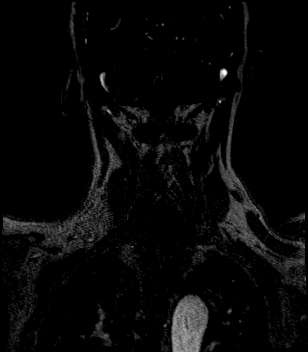
[im 96/96]
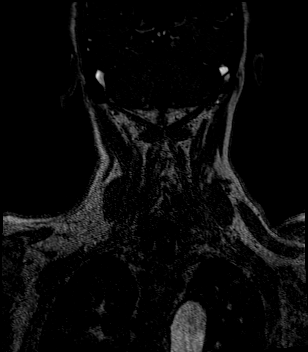

[Series 1151: lcca · 0.69mm/px · 1 of 4 slices shown]
[im 1/4]
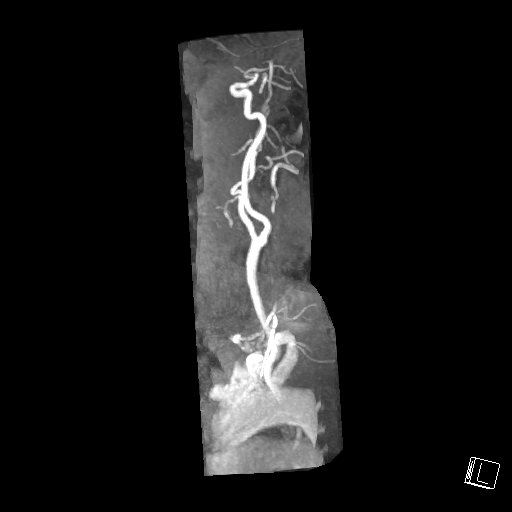

[Series 1155: rcca · 0.70mm/px · 2 of 8 slices shown]
[im 1/8]
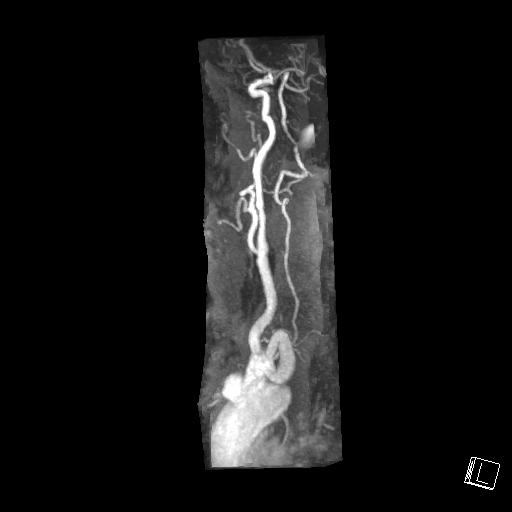
[im 8/8]
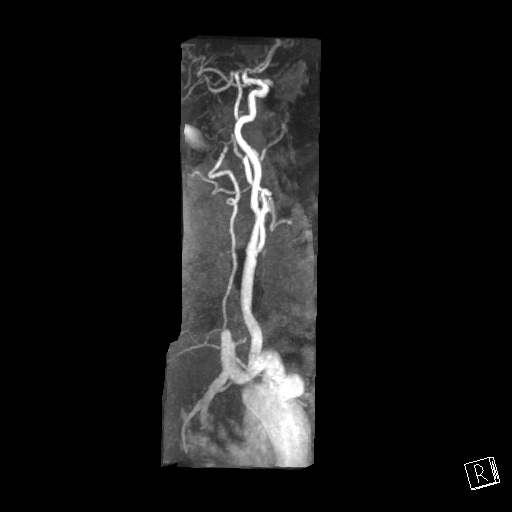

[21 of 48 positions shown; findings below may reference images not displayed]

FINDINGS: MRA NECK FINDINGS

Standard aortic branching. Imaged portion shows no evidence of
aneurysm or dissection. No significant stenosis of the major arch
vessel origins.

Common and internal carotid arteries are patent, without
hemodynamically significant stenosis. Redemonstrated mild stenosis
at the origin of the right ECA. No evidence of aneurysm or
dissection.

The origin of the right vertebral artery is poorly visualized, which
may be secondary to artifact or reflect moderate stenosis (series
19, image 67). The right vertebral artery is otherwise patent to the
skull base. The left vertebral artery is not visualized proximally
on either time-of-flight or contrast-enhanced sequences, with
reconstitution in the mid to upper cervical spine on
contrast-enhanced sequences only (series 19, image 62), similar to
the prior exam. No evidence of aneurysm or dissection.

MRA HEAD FINDINGS

Both internal carotid arteries are patent to the termini, without
significant stenosis.

A1 segments patent. Normal anterior communicating artery. Anterior
cerebral arteries are patent to their distal aspects.

No M1 stenosis or occlusion. Normal MCA bifurcations. Distal MCA
branches perfused and symmetric.

The right vertebral artery is patent to the vertebrobasilar
junction. The right PICA is visualized. Improved signal on
time-of-flight imaging in the left vertebral artery, with moderate
stenosis at the vertebrobasilar junction (series 5, image 31 and MRA
neck series 19, image 63). Moderate focal stenosis in the mid left
V4 (series 5, image 6). No significant stenosis in the imaged right
V4. Right posterior inferior cerebral artery is patent. The left
PICA is not visualized.

Basilar patent to its distal aspect. Superior cerebellar arteries
patent bilaterally.

Patent P1 segments. PCAs perfused to their distal aspects without
stenosis. The bilateral posterior communicating arteries are
visualized.
IMPRESSION: 1. Occlusion of the left vertebral artery at its origin, with
reconstitution on contrast-enhanced MRA in the distal V2 segment,
with additional moderate stenosis in the mid and distal V4. The left
PICA is not visualized.
2. Poor visualization of the origin of the right vertebral artery on
postcontrast MRA, which may be secondary to artifact or reflect
stenosis.
3. No other hemodynamically significant stenosis in the head or
neck.

## 2021-06-28 IMAGING — MR MR MRA HEAD W/O CM
1 series · 25 of 48 positions shown · IV contrast (gadavist)
Comparison: [DATE]

CLINICAL DATA: Carotid artery stenosis screening, concern for
dissection, acute infarct on [DATE]

EXAM:
MRA NECK WITHOUT AND WITH CONTRAST
MRA HEAD WITHOUT CONTRAST
TECHNIQUE: Multiplanar and multiecho pulse sequences of the neck were obtained
without and with intravenous contrast. Angiographic images of the
neck were obtained using MRA technique without and with intravenous
contast.; Angiographic images of the Circle of Willis were obtained
using MRA technique without intravenous contrast.
CONTRAST:  7.5mL GADAVIST GADOBUTROL 1 MMOL/ML IV SOLN

[Series 5: TOF · axial · 0.5mm · 0.48mm/px · z∈[-54,+42]mm · 25 of 217 slices shown]
[im 1/217]
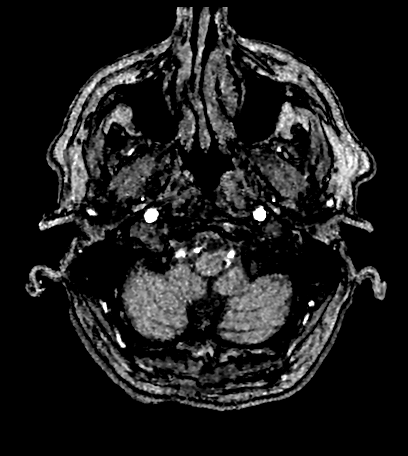
[im 5/217]
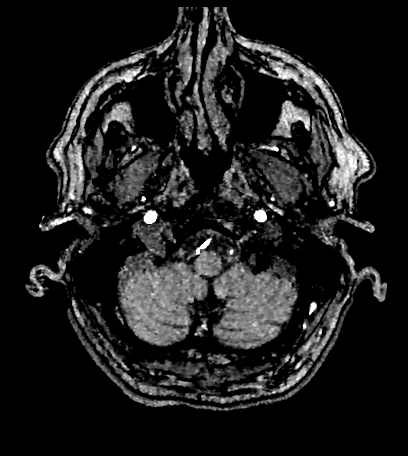
[im 10/217]
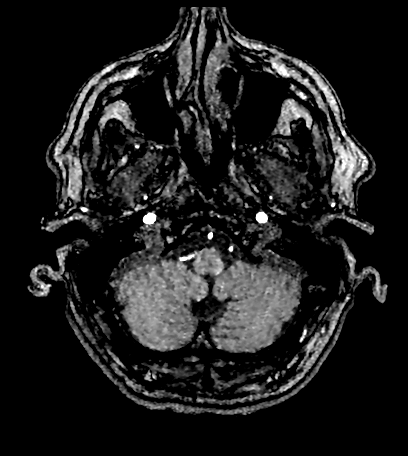
[im 14/217]
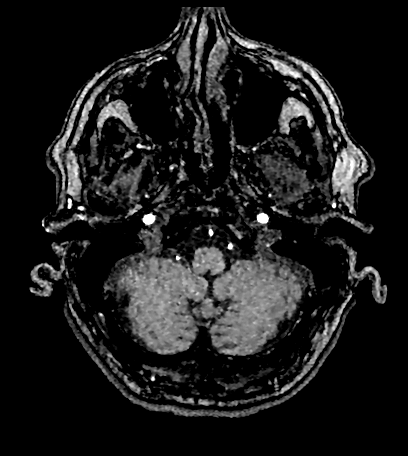
[im 19/217]
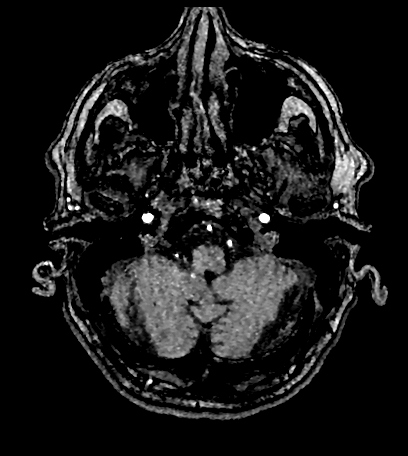
[im 23/217]
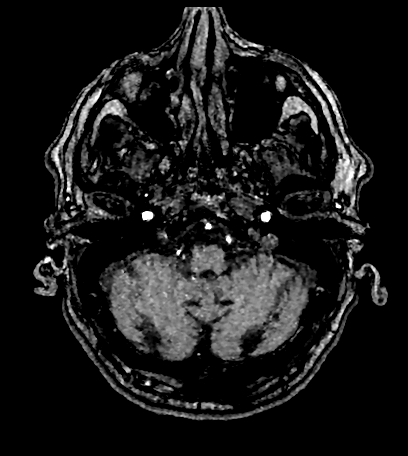
[im 28/217]
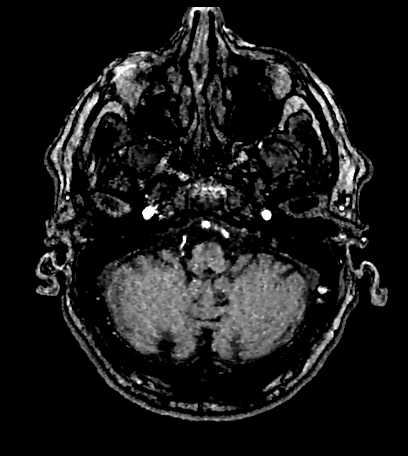
[im 33/217]
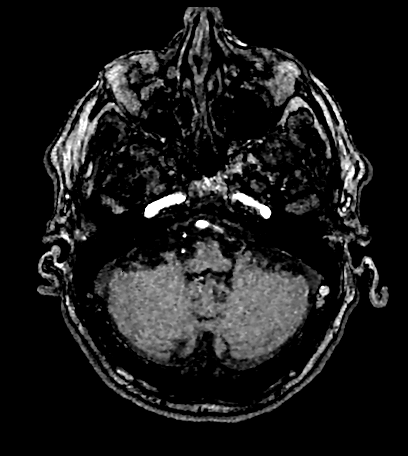
[im 37/217]
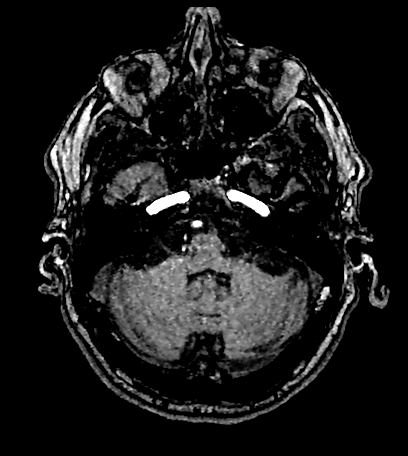
[im 42/217]
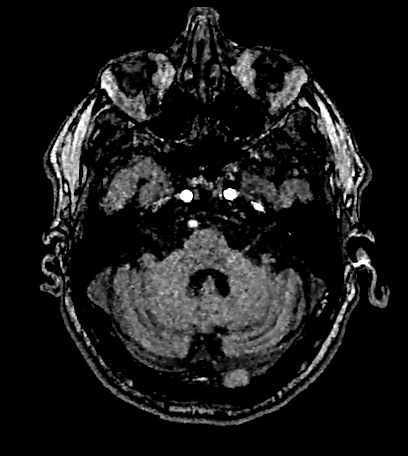
[im 46/217]
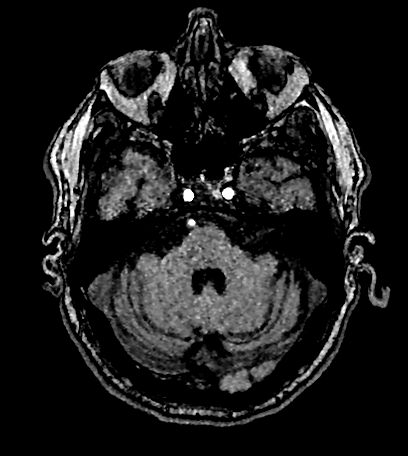
[im 51/217]
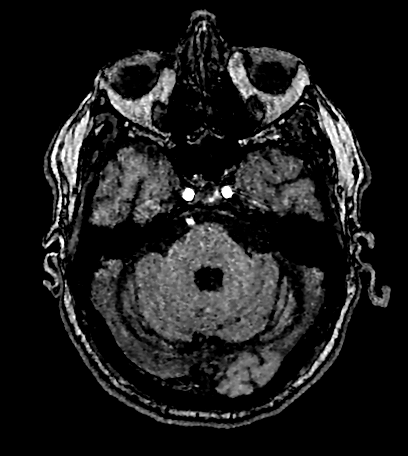
[im 56/217]
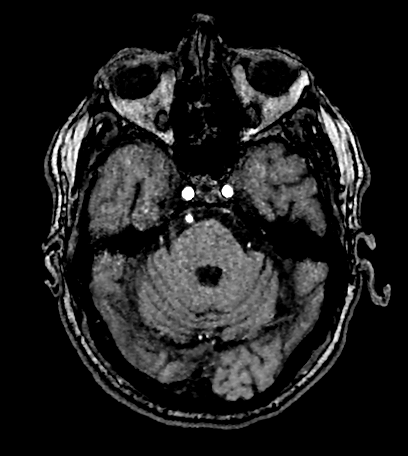
[im 60/217]
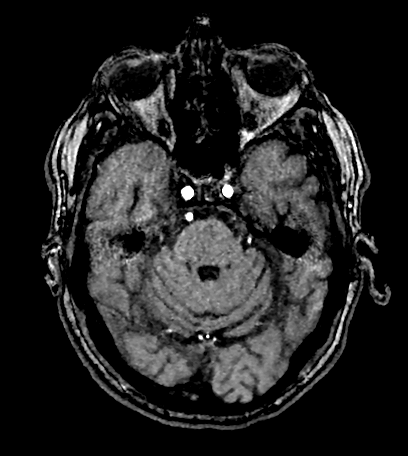
[im 65/217]
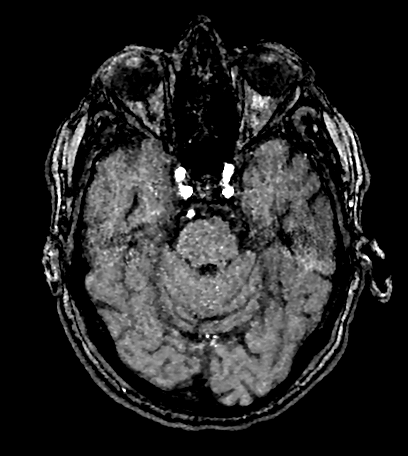
[im 69/217]
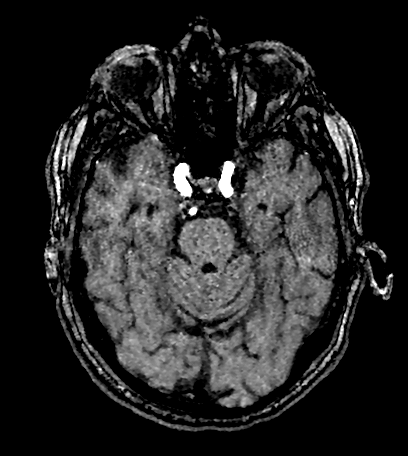
[im 74/217]
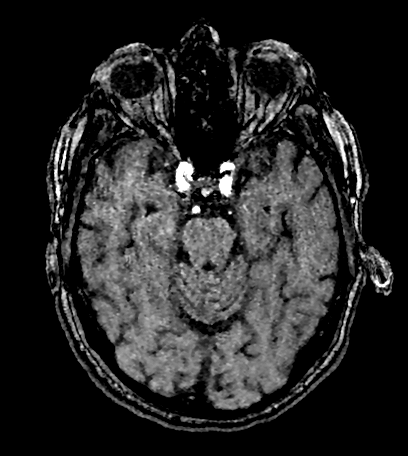
[im 79/217]
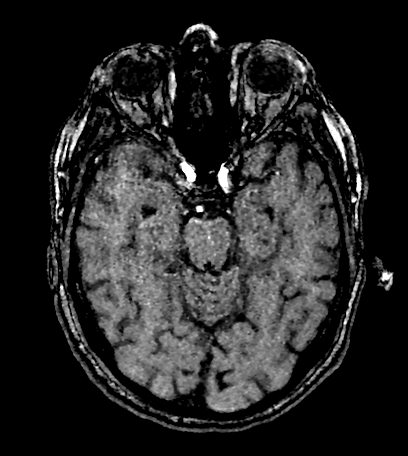
[im 97/217]
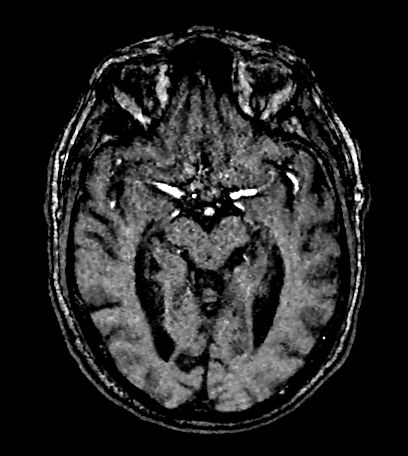
[im 111/217]
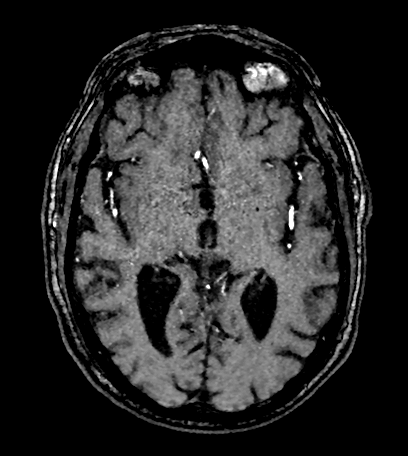
[im 125/217]
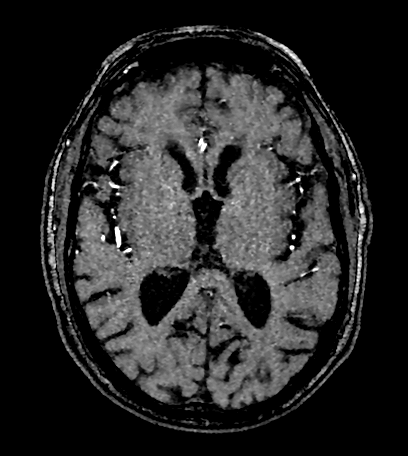
[im 152/217]
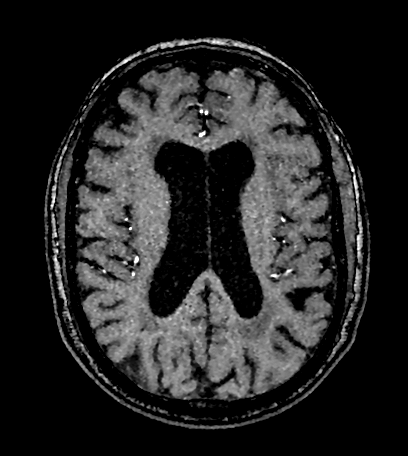
[im 180/217]
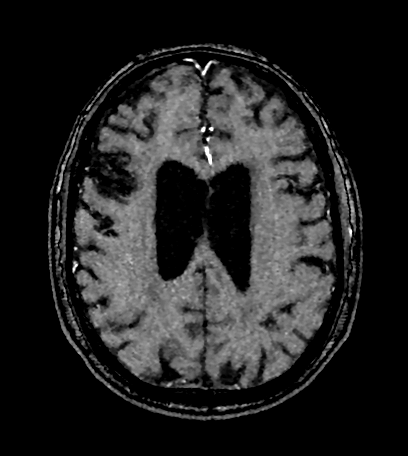
[im 184/217]
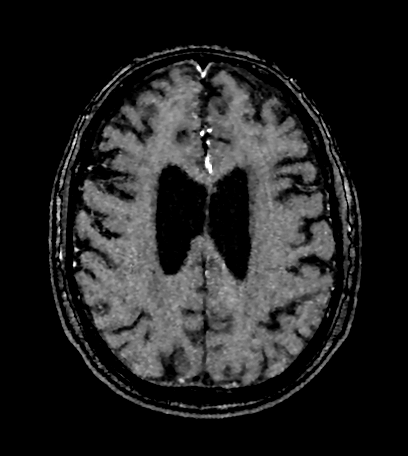
[im 207/217]
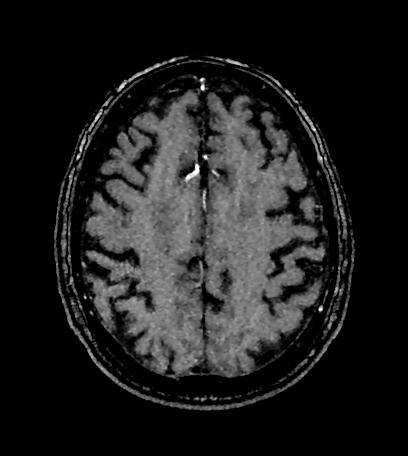

[25 of 48 positions shown; findings below may reference images not displayed]

FINDINGS: MRA NECK FINDINGS

Standard aortic branching. Imaged portion shows no evidence of
aneurysm or dissection. No significant stenosis of the major arch
vessel origins.

Common and internal carotid arteries are patent, without
hemodynamically significant stenosis. Redemonstrated mild stenosis
at the origin of the right ECA. No evidence of aneurysm or
dissection.

The origin of the right vertebral artery is poorly visualized, which
may be secondary to artifact or reflect moderate stenosis (series
19, image 67). The right vertebral artery is otherwise patent to the
skull base. The left vertebral artery is not visualized proximally
on either time-of-flight or contrast-enhanced sequences, with
reconstitution in the mid to upper cervical spine on
contrast-enhanced sequences only (series 19, image 62), similar to
the prior exam. No evidence of aneurysm or dissection.

MRA HEAD FINDINGS

Both internal carotid arteries are patent to the termini, without
significant stenosis.

A1 segments patent. Normal anterior communicating artery. Anterior
cerebral arteries are patent to their distal aspects.

No M1 stenosis or occlusion. Normal MCA bifurcations. Distal MCA
branches perfused and symmetric.

The right vertebral artery is patent to the vertebrobasilar
junction. The right PICA is visualized. Improved signal on
time-of-flight imaging in the left vertebral artery, with moderate
stenosis at the vertebrobasilar junction (series 5, image 31 and MRA
neck series 19, image 63). Moderate focal stenosis in the mid left
V4 (series 5, image 6). No significant stenosis in the imaged right
V4. Right posterior inferior cerebral artery is patent. The left
PICA is not visualized.

Basilar patent to its distal aspect. Superior cerebellar arteries
patent bilaterally.

Patent P1 segments. PCAs perfused to their distal aspects without
stenosis. The bilateral posterior communicating arteries are
visualized.
IMPRESSION: 1. Occlusion of the left vertebral artery at its origin, with
reconstitution on contrast-enhanced MRA in the distal V2 segment,
with additional moderate stenosis in the mid and distal V4. The left
PICA is not visualized.
2. Poor visualization of the origin of the right vertebral artery on
postcontrast MRA, which may be secondary to artifact or reflect
stenosis.
3. No other hemodynamically significant stenosis in the head or
neck.

## 2021-06-28 MED ORDER — GADOBUTROL 1 MMOL/ML IV SOLN
7.5000 mL | Freq: Once | INTRAVENOUS | Status: AC | PRN
  Administered 2021-06-28: 7.5 mL via INTRAVENOUS

## 2021-06-28 MED ORDER — DIPHENHYDRAMINE HCL 25 MG PO CAPS: 25.0000 mg | ORAL_CAPSULE | Freq: Four times a day (QID) | ORAL | Status: DC | PRN

## 2021-06-28 MED ORDER — ASPIRIN EC 81 MG PO TBEC
81.0000 mg | DELAYED_RELEASE_TABLET | Freq: Every day | ORAL | Status: DC
  Administered 2021-06-28 – 2021-06-29 (×2): 81 mg via ORAL
  Filled 2021-06-28 (×2): qty 1

## 2021-06-28 MED ORDER — ACETAMINOPHEN 650 MG RE SUPP: 650.0000 mg | RECTAL | Status: DC | PRN

## 2021-06-28 MED ORDER — CLOPIDOGREL BISULFATE 75 MG PO TABS
75.0000 mg | ORAL_TABLET | Freq: Every day | ORAL | Status: DC
  Administered 2021-06-28 – 2021-06-29 (×2): 75 mg via ORAL
  Filled 2021-06-28 (×2): qty 1

## 2021-06-28 MED ORDER — POTASSIUM CHLORIDE CRYS ER 20 MEQ PO TBCR
40.0000 meq | EXTENDED_RELEASE_TABLET | Freq: Once | ORAL | Status: AC
Start: 2021-06-28 — End: 2021-06-28
  Administered 2021-06-28: 40 meq via ORAL
  Filled 2021-06-28: qty 2

## 2021-06-28 MED ORDER — ACETAMINOPHEN 160 MG/5ML PO SOLN
650.0000 mg | ORAL | Status: DC | PRN
  Filled 2021-06-28: qty 20.3

## 2021-06-28 MED ORDER — FINASTERIDE 5 MG PO TABS
5.0000 mg | ORAL_TABLET | Freq: Every day | ORAL | Status: DC
  Administered 2021-06-28 – 2021-06-29 (×2): 5 mg via ORAL
  Filled 2021-06-28 (×2): qty 1

## 2021-06-28 MED ORDER — TRAZODONE HCL 50 MG PO TABS: 50.0000 mg | ORAL_TABLET | Freq: Every evening | ORAL | Status: DC | PRN

## 2021-06-28 MED ORDER — GABAPENTIN 300 MG PO CAPS
600.0000 mg | ORAL_CAPSULE | Freq: Two times a day (BID) | ORAL | Status: DC
  Administered 2021-06-28 – 2021-06-29 (×2): 600 mg via ORAL
  Filled 2021-06-28 (×2): qty 2

## 2021-06-28 MED ORDER — STROKE: EARLY STAGES OF RECOVERY BOOK: Freq: Once | Status: DC

## 2021-06-28 MED ORDER — INSULIN ASPART 100 UNIT/ML IJ SOLN
0.0000 [IU] | Freq: Three times a day (TID) | INTRAMUSCULAR | Status: DC
  Administered 2021-06-28: 3 [IU] via SUBCUTANEOUS
  Administered 2021-06-28: 2 [IU] via SUBCUTANEOUS
  Administered 2021-06-28: 3 [IU] via SUBCUTANEOUS
  Administered 2021-06-29 (×2): 2 [IU] via SUBCUTANEOUS
  Filled 2021-06-28 (×5): qty 1

## 2021-06-28 MED ORDER — ACETAMINOPHEN 325 MG PO TABS
650.0000 mg | ORAL_TABLET | ORAL | Status: DC | PRN
  Administered 2021-06-28 – 2021-06-29 (×2): 650 mg via ORAL
  Filled 2021-06-28 (×2): qty 2

## 2021-06-28 MED ORDER — SODIUM CHLORIDE 0.9 % IV SOLN: INTRAVENOUS | Status: DC

## 2021-06-28 MED ORDER — INSULIN ASPART 100 UNIT/ML IJ SOLN: 0.0000 [IU] | Freq: Every day | INTRAMUSCULAR | Status: DC

## 2021-06-28 MED ORDER — ENOXAPARIN SODIUM 40 MG/0.4ML IJ SOSY
40.0000 mg | PREFILLED_SYRINGE | INTRAMUSCULAR | Status: DC
  Administered 2021-06-28 – 2021-06-29 (×2): 40 mg via SUBCUTANEOUS
  Filled 2021-06-28 (×2): qty 0.4

## 2021-06-28 MED ORDER — ATORVASTATIN CALCIUM 20 MG PO TABS
40.0000 mg | ORAL_TABLET | Freq: Every day | ORAL | Status: DC
  Administered 2021-06-28 – 2021-06-29 (×2): 40 mg via ORAL
  Filled 2021-06-28 (×2): qty 2

## 2021-06-28 MED ORDER — DULOXETINE HCL 30 MG PO CPEP
30.0000 mg | ORAL_CAPSULE | Freq: Every day | ORAL | Status: DC
  Administered 2021-06-28 – 2021-06-29 (×2): 30 mg via ORAL
  Filled 2021-06-28 (×2): qty 1

## 2021-06-28 NOTE — Assessment & Plan Note (Signed)
Hold antihypertensives of losartan and HCTZ to allow for permissive hypertension ?

## 2021-06-28 NOTE — Assessment & Plan Note (Signed)
Continue aspirin and Plavix and atorvastatin ?Permissive hypertension for first 24-48 hrs post stroke onset: Prn Labetalol IV or Vasotec IV If BP greater than 220/120  ?Statins for LDL goal less than 70 ?Telemetry,  ?Avoid dextrose containing fluids, Maintain euglycemia, euthermia ?Neuro checks q4 hrs x 24 hrs and then per shift ?Head of bed 30 degrees ?Physical therapy/Occupational therapy/Speech therapy if failed dysphagia screen ?Neurology consult to follow ? ?

## 2021-06-28 NOTE — H&P (Signed)
?History and Physical  ? ? ?Patient: Aaron Stafford ZDG:[id number] DOB: 05/07/1934 ?DOA: 06/27/2021 ?DOS: the patient was seen and examined on 06/28/2021 ?PCP: Ria Bush, MD  ?Patient coming from: Home ? ?Chief Complaint:  ?Chief Complaint  ?Patient presents with  ? Weakness  ? ? ?HPI: Aaron Stafford is a 87 y.o. 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  He had been doing well until this time and taking medications as prescribed. ?ED course and data review: BP 150/73 with otherwise normal vitals.  WBC 13,000, potassium 3.3 glucose 194 with otherwise unremarkable blood work.  Urinalysis unremarkable.  EKG, personally viewed and interpreted showing normal sinus rhythm at 74 with nonspecific ST-T wave changes.  MRI brain showing the following ?IMPRESSION: ?1. 1.5 cm acute ischemic nonhemorrhagic infarct involving the ?paramedian superior right cerebellar vermis. ?2. Irregular flow void within the proximal left V4 segment, ?consistent with stenosis and attenuated flow as seen on previous ?MRA. ?3. Age-related cerebral atrophy with moderate chronic microvascular ?ischemic disease. Small remote infarct at the inferior left ?cerebellar tonsil. ? ?Hospitalist consulted for admission.  ? ?Review of Systems: As mentioned in the history of present illness. All other systems reviewed and are negative. ?Past Medical History:  ?Diagnosis Date  ? CAD (coronary artery disease)   ? a. 1995 s/p CABG x 4 (LIMA->LAD, VG->OM1->OM2, VG->RCA); b. 07/2009 PCI: LM 60, LAD 100ost, LCX 100p, RCA 100, LIMA->LAD atretic, VG->OM1->OM2 patent w/ collats to dLAD, VG->RCA 95 (3.5x15 Vision BMS). EF 55%; c. 02/2021 MV: EF 30% (50-55% by echo), large  inferolateral scar w/ HK. No ischemia.  ? Chronic rhinitis   ? ENT   ? COVID-19 virus infection 09/2019  ? Diabetes mellitus type II   ? Diastolic dysfunction   ? a. 03/2021 Echo: EF 50-55%, no rwma, GrI DD. RVSP 35.79mHg. Mild MR.  ? Diverticulosis 06/2011  ? by CT and colonoscopy  ? Fatty liver 06/2011  ? by CT  ? GERD (gastroesophageal reflux disease)   ? History of BPH   ? History of shingles   ? HLD (hyperlipidemia)   ? HTN (hypertension)   ? Ischemic heart disease   ? Melanoma ([facility] 12/2015  ? L forearm, scalp  ? MI (myocardial infarction) ([facility] [place]  ? Shingles rash 09/26/2012  ? Sialadenitis 05/01/2017  ? ?Past Surgical History:  ?Procedure Laterality Date  ? ANKLE SURGERY  03/2002  ? Left ankle fracture  ? CARDIAC CATHETERIZATION    ? CHOLECYSTECTOMY  1985  ? COLONOSCOPY    ? diverticulosis, tortuous colon with looping ([name]  ? CORONARY ARTERY BYPASS GRAFT  05/1993  ? x6 MI Attemped angioplasty  ? CORONARY STENT PLACEMENT  07/25/2009  ? Multi-Link VISION Cobalt chromium   ? CYSTOSCOPY  10/03/08  ? Normal-Dr. SBernardo Heater ? INGUINAL HERNIA REPAIR  11/2000  ? and ventral hernia repair  ? ?Social History:  reports that he has never smoked. He has never used smokeless tobacco. He reports that he does not drink alcohol and does not use drugs. ? ?Allergies  ?Allergen Reactions  ? Morphine Sulfate   ?  REACTION: Hallucinations  ? Nitroglycerin   ?  REACTION: Decreased BP  ? Contrast Media [  Iodinated Contrast Media] Rash  ?  Severe hives  ? ? ?Family History  ?Problem Relation Age of Onset  ? Kidney failure Mother   ? Coronary artery disease Mother   ? Heart failure Mother   ? Hypertension Brother   ? Heart attack Brother   ? Alcohol abuse Brother   ? Hypertension Brother   ? Hypertension Sister   ? ? ?Prior to Admission medications   ?Medication Sig Start Date End Date Taking? Authorizing Provider  ?Alpha-Lipoic Acid 600 MG CAPS Take 1 capsule (600 mg total) by mouth 2 (two) times daily. 06/26/16   Ria Bush, MD  ?aspirin 81 MG tablet Take 81 mg by mouth daily.    [provider]  ?atorvastatin (LIPITOR) 40 MG tablet Take 1 tablet (40 mg total) by mouth daily. 06/13/21 09/11/21  Emeterio Reeve, DO  ?Blood Glucose Monitoring Suppl (ONETOUCH VERIO FLEX SYSTEM) w/Device KIT Check blood sugar once daily 02/05/21   Ria Bush, MD  ?Cholecalciferol (VITAMIN D3) 1000 UNITS CAPS Take 1 capsule (1,000 Units total) by mouth daily. 12/14/14   Ria Bush, MD  ?clopidogrel (PLAVIX) 75 MG tablet Take 1 tablet (75 mg total) by mouth daily. 06/14/21 09/12/21  Emeterio Reeve, DO  ?Cranberry 500 MG CAPS Take 2 capsules (1,000 mg total) by mouth daily. 02/03/19   Ria Bush, MD  ?DULoxetine (CYMBALTA) 30 MG capsule TAKE 1 CAPSULE BY MOUTH EVERY DAY 05/25/21   Ria Bush, MD  ?finasteride (PROSCAR) 5 MG tablet TAKE 1 TABLET BY MOUTH EVERY DAY 05/25/21   Ria Bush, MD  ?fluticasone [facility]) 50 MCG/ACT nasal spray SPRAY 2 SPRAYS INTO EACH NOSTRIL EVERY DAY 10/17/20   Ria Bush, MD  ?furosemide (LASIX) 20 MG tablet TAKE 1 TABLET BY MOUTH EVERY DAY 10/17/20   Ria Bush, MD  ?gabapentin (NEURONTIN) 300 MG capsule TAKE 2 CAPSULES BY MOUTH TWICE A DAY 10/18/20   Ria Bush, MD  ?glucose blood [facility] VERIO) test strip Check blood sugar once daily 02/05/21   Ria Bush, MD  ?hydrochlorothiazide (HYDRODIURIL) 25 MG tablet TAKE 1 TABLET BY MOUTH EVERY DAY 10/17/20   Ria Bush, MD  ?isosorbide mononitrate (IMDUR) 30 MG 24 hr tablet Take 0.5 tablets (15 mg total) by mouth daily. 03/07/21 06/12/21  Theora Gianotti, NP  ?ketoconazole (NIZORAL) 2 % cream Apply topically. 06/25/21   [provider]  ?losartan (COZAAR) 50 MG tablet TAKE 1 TABLET BY MOUTH EVERY DAY 05/25/21   Ria Bush, MD  ?metFORMIN (GLUCOPHAGE) 500 MG tablet TAKE 1 TABLET BY MOUTH 2 TIMES DAILY WITH A MEAL. 10/17/20   Ria Bush, MD  ?mometasone (ELOCON) 0.1 % lotion Apply  topically. 06/27/21   [provider]  ?Jonetta Speak Lancets 16X MISC Check blood sugar once daily 02/05/21   Ria Bush, MD  ?polyethylene glycol powder (GLYCOLAX/MIRALAX) 17 GM/SCOOP powder TAKE 8.5-17 G BY MOUTH DAILY AS NEEDED FOR MODERATE CONSTIPATION. 10/18/20   Ria Bush, MD  ?traZODone (DESYREL) 50 MG tablet TAKE 1 tablet BY MOUTH AT BEDTIME AS NEEDED FOR SLEEP 02/23/21   Ria Bush, MD  ? ? ?Physical Exam: ?Vitals:  ? 06/28/21 0000 06/28/21 0045 06/28/21 0145 06/28/21 0215  ?BP:    (!) 153/72  ?Pulse: 63 66 63 62  ?Resp: (!) _0 ?Temp:      ?TempSrc:      ?SpO2: 97% 98% 96% 96%  ?Weight:      ?Height:      ? ?Physical Exam ?Vitals and  nursing note reviewed.  ?Constitutional:   ?   General: He is not in acute distress. ?HENT:  ?   Head: Normocephalic and atraumatic.  ?Cardiovascular:  ?   Rate and Rhythm: Normal rate and regular rhythm.  ?   Pulses: Normal pulses.  ?   Heart sounds: Normal heart sounds.  ?Pulmonary:  ?   Effort: Pulmonary effort is normal.  ?   Breath sounds: Normal breath sounds.  ?Abdominal:  ?   Palpations: Abdomen is soft.  ?   Tenderness: There is no abdominal tenderness.  ? ? ? ?Data Reviewed: ?Relevant notes from primary care and specialist visits, past discharge summaries as available in EHR, including Care Everywhere. ?Prior diagnostic testing as pertinent to current admission diagnoses ?Updated medications and problem lists for reconciliation ?ED course, including vitals, labs, imaging, treatment and response to treatment ?Triage notes, nursing and pharmacy notes and ED provider's notes ?Notable results as noted in HPI ? ? ?Assessment and Plan: ?* Cerebellar stroke, acute , recurrent(HCC) ?Continue aspirin and Plavix and atorvastatin ?Permissive hypertension for first 24-48 hrs post stroke onset: Prn Labetalol IV or Vasotec IV If BP greater than 220/120  ?Statins for LDL goal less than 70 ?Telemetry,  ?Avoid dextrose containing fluids,  Maintain euglycemia, euthermia ?Neuro checks q4 hrs x 24 hrs and then per shift ?Head of bed 30 degrees ?Physical therapy/Occupational therapy/Speech therapy if failed dysphagia screen ?Neurology consult t

## 2021-06-28 NOTE — ED Notes (Signed)
Patient ate 100% breakfast tray ?

## 2021-06-28 NOTE — Evaluation (Signed)
Physical Therapy Evaluation ?Patient Details ?Name: Aaron Stafford ?MRN: [id number] ?DOB: 1934-04-30 ?Today's Date: 06/28/2021 ? ?History of Present Illness ? Aaron Stafford is a 87 y.o. Stafford with medical history significant for HTN, DM, BPH, HLD, CAD s/p CABG 1914, diastolic CHF (ECHO 7/82/9562 with EF 55 to 55% and G2DD), recently hospitalized from 4/25 to 06/13/2021 with a small left cerebellar stroke and discharged. 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  He had been doing well until this time and taking medications as prescribed.  ED course and data review: BP 150/73 with otherwise normal vitals.  WBC 13,000, potassium 3.3 glucose 194 with otherwise unremarkable blood work.  Urinalysis unremarkable.  EKG, personally viewed and interpreted showing normal sinus rhythm at 74 with nonspecific ST-T wave changes. MRI showing 1.5 cm acute ischemic nonhemorrhagic infarct involving the  paramedian superior right cerebellar vermis. ?  ?Clinical Impression ? Patient received in bed, family/spouse in room. Patient agreeable to PT assessment. He is independent with bed mobility and transfers. Ambulated with supervision 200 feet no AD, performed head turning, no lob. Able to stand with eyes closed without losing balance. He appears to be at baseline level of function. Patient does not require follow up PT at this time.    ?   ? ?Recommendations for follow up therapy are one component of a multi-disciplinary discharge planning process, led by the attending physician.  Recommendations may be updated based on patient status, additional functional criteria and insurance authorization. ? ?Follow Up Recommendations No PT follow up ? ?  ?Assistance Recommended at Discharge Intermittent Supervision/Assistance  ?Patient can return home with the following ?   ? ?  ?Equipment Recommendations None recommended by PT  ?Recommendations for Other Services ?    ?  ?Functional Status  Assessment Patient has not had a recent decline in their functional status  ? ?  ?Precautions / Restrictions Precautions ?Precaution Comments: mod fall ?Restrictions ?Weight Bearing Restrictions: No  ? ?  ? ?Mobility ? Bed Mobility ?Overal bed mobility: Modified Independent ?  ?  ?  ?  ?  ?  ?General bed mobility comments: easily gets to sitting EOB w/o assist ?  ? ?Transfers ?Overall transfer level: Independent ?Equipment used: None ?  ?  ?  ?  ?  ?  ?  ?General transfer comment: Able to rise and maintain standing w/o issue, good confidence and safety ?  ? ?Ambulation/Gait ?Ambulation/Gait assistance: Min guard ?Gait Distance (Feet): 200 Feet ?Assistive device: None ?Gait Pattern/deviations: Step-through pattern ?Gait velocity: slightly slowed ?  ?  ?General Gait Details: Patient ambulated without AD, performed head turns with ambulation and does not have lob. Able to static stand with eyes closed without LOB. ? ?Stairs ?  ?  ?  ?  ?  ? ?Wheelchair Mobility ?  ? ?Modified Rankin (Stroke Patients Only) ?  ? ?  ? ?Balance Overall balance assessment: Independent ?  ?  ?  ?  ?  ?  ?  ?  ?  ?  ?  ?  ?  ?  ?  ?  ?  ?  ?   ? ? ? ?Pertinent Vitals/Pain Pain Assessment ?Pain Assessment: No/denies pain  ? ? ?Home Living Family/patient expects to be discharged to:: Private residence ?Living Arrangements: Spouse/significant other ?Available Help at Discharge: Family;Available 24 hours/day ?Type of Home: House ?Home Access: Stairs to enter ?  ?Entrance Stairs-Number of Steps:  1 ?  ?Home Layout: One level ?Home Equipment: None ?   ?  ?Prior Function Prior Level of Function : Independent/Modified Independent;Working/employed;Driving ?  ?  ?  ?  ?  ?  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  ? ? ?Hand Dominance  ?   ? ?  ?Extremity/Trunk Assessment  ? Upper Extremity Assessment ?Upper Extremity Assessment: Defer to OT evaluation ?  ? ?Lower  Extremity Assessment ?Lower Extremity Assessment: Overall WFL for tasks assessed ?  ? ?Cervical / Trunk Assessment ?Cervical / Trunk Assessment: Normal  ?Communication  ? Communication: No difficulties  ?Cognition Arousal/Alertness: Awake/alert ?Behavior During Therapy: [facility] for tasks assessed/performed ?Overall Cognitive Status: Within Functional Limits for tasks assessed ?  ?  ?  ?  ?  ?  ?  ?  ?  ?  ?  ?  ?  ?  ?  ?  ?  ?  ?  ? ?  ?General Comments   ? ?  ?Exercises    ? ?Assessment/Plan  ?  ?PT Assessment Patient does not need any further PT services  ?PT Problem List Decreased balance ? ?   ?  ?PT Treatment Interventions DME instruction;Gait training;Functional mobility training;Therapeutic exercise;Therapeutic activities;Balance training;Neuromuscular re-education;Stair training;Patient/family education   ? ?PT Goals (Current goals can be found in the Care Plan section)  ?Acute Rehab PT Goals ?Patient Stated Goal: go home ?PT Goal Formulation: With patient/family ?Time For Goal Achievement: 07/12/21 ?Potential to Achieve Goals: Good ? ?  ?Frequency   ?  ? ? ?Co-evaluation   ?  ?  ?  ?  ? ? ?  ?AM-PAC PT "6 Clicks" Mobility  ?Outcome Measure Help needed turning from your back to your side while in a flat bed without using bedrails?: None ?Help needed moving from lying on your back to sitting on the side of a flat bed without using bedrails?: None ?Help needed moving to and from a bed to a chair (including a wheelchair)?: None ?Help needed standing up from a chair using your arms (e.g., wheelchair or bedside chair)?: None ?Help needed to walk in hospital room?: None ?Help needed climbing 3-5 steps with a railing? : None ?6 Click Score: 24 ? ?  ?End of Session Equipment Utilized During Treatment: Gait belt ?Activity Tolerance: Patient tolerated treatment well ?Patient left: in bed;with call bell/phone within reach;with family/visitor present ?Nurse Communication: Mobility status ?PT Visit Diagnosis: Other symptoms  and signs involving the nervous system (R29.898) ?  ? ?Time: 5400-8676 ?PT Time Calculation (min) (ACUTE ONLY): 24 min ? ? ?Charges:   PT Evaluation ?$PT Eval Moderate Complexity: 1 Mod ?PT Treatments ?$Gait Training: 8-22 mins ?  ?   ? ? ?[employer], PT, [employer] ?06/28/21,11:55 AM ? ?

## 2021-06-28 NOTE — Evaluation (Addendum)
Speech Language Pathology Evaluation ?Patient Details ?Name: Aaron Stafford ?MRN: [id number] ?DOB: [birth year] ?Today's Date: 06/28/2021 ?Time: 5329-9242 ?SLP Time Calculation (min) (ACUTE ONLY): 20 min ? ?Problem List:  ?Patient Active Problem List  ? Diagnosis Date Noted  ? Cerebellar stroke ([place]) 06/28/2021  ? Expressive aphasia   ? TIA (transient ischemic attack)   ? Cerebellar stroke, acute , recurrent(HCC) 06/12/2021  ? HLD (hyperlipidemia)   ? Fall   ? CAD (coronary artery disease)   ? Chronic diastolic CHF (congestive heart failure) ([place])   ? Pain of right clavicle 02/24/2021  ? Chest tightness 02/24/2021  ? New onset headache 09/07/2020  ? Chronic constipation 08/22/2020  ? Right hand pain 08/18/2019  ? Tremor 08/14/2018  ? Urinary urgency 03/11/2017  ? Type 2 diabetes mellitus with diabetic neuropathy, unspecified ([place]) 10/05/2015  ? Advanced care planning/counseling discussion 06/26/2014  ? Stress 08/09/2013  ? BPH with obstruction/lower urinary tract symptoms   ? Medicare annual wellness visit, subsequent 03/25/2011  ? S/P CABG (coronary artery bypass graft) 06/07/2010  ? HTN (hypertension)   ? Hyperlipidemia associated with type 2 diabetes mellitus ([place])   ? Type 2 diabetes, controlled, with peripheral neuropathy ([place])   ? GERD (gastroesophageal reflux disease)   ? Chronic rhinitis   ? Vitamin D deficiency 04/11/2008  ? Insomnia 03/14/2008  ? Overweight with body mass index (BMI) 25.0-29.9 07/03/2006  ? ERECTILE DYSFUNCTION 07/01/2006  ? Coronary atherosclerosis 07/01/2006  ? Irritable bowel syndrome 07/01/2006  ? ?Past Medical History:  ?Past Medical History:  ?Diagnosis Date  ? CAD (coronary artery disease)   ? a. 1995 s/p CABG x 4 (LIMA->LAD, VG->OM1->OM2, VG->RCA); b. 07/2009 PCI: LM 60, LAD 100ost, LCX 100p, RCA 100, LIMA->LAD atretic, VG->OM1->OM2 patent w/ collats to dLAD, VG->RCA 95 (3.5x15 Vision BMS). EF 55%; c. 02/2021 MV: EF 30% (50-55% by echo), large inferolateral scar w/ HK. No ischemia.  ?  Chronic rhinitis   ? ENT [place]  ? COVID-19 virus infection 09/2019  ? Diabetes mellitus type II   ? Diastolic dysfunction   ? a. 03/2021 Echo: EF 50-55%, no rwma, GrI DD. RVSP 35.26mHg. Mild MR.  ? Diverticulosis 06/2011  ? by CT and colonoscopy  ? Fatty liver 06/2011  ? by CT  ? GERD (gastroesophageal reflux disease)   ? History of BPH   ? History of shingles   ? HLD (hyperlipidemia)   ? HTN (hypertension)   ? Ischemic heart disease   ? [place]anoma ([place] 12/2015  ? L forearm, scalp  ? MI ([place]ocardial infarction) ([facility] 1995  ? Shingles rash 09/26/2012  ? Sialadenitis 05/01/2017  ? ?Past Surgical History:  ?Past Surgical History:  ?Procedure Laterality Date  ? ANKLE SURGERY  03/2002  ? Left ankle fracture  ? CARDIAC CATHETERIZATION    ? CHOLECYSTECTOMY  1985  ? COLONOSCOPY    ? diverticulosis, tortuous colon with looping ([name]  ? CORONARY ARTERY BYPASS GRAFT  05/1993  ? x6 MI Attemped angioplasty  ? CORONARY STENT PLACEMENT  07/25/2009  ? Multi-Link VISION Cobalt chromium   ? CYSTOSCOPY  10/03/08  ? Normal-Dr. SBernardo Heater ? INGUINAL HERNIA REPAIR  11/2000  ? and ventral hernia repair  ? ?HPI:  ?Per P38H&P "NWELBORN KEENAis a 87y.o. 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  He had been doing well until this time and taking medications as prescribed.  ED course and data review: BP 150/73 with otherwise normal vitals.  WBC 13,000, potassium 3.3 glucose 194 with otherwise unremarkable blood work.  Urinalysis unremarkable.  EKG, personally viewed and interpreted showing normal sinus rhythm at 74 with nonspecific ST-T wave changes.  MRI brain showing the following  IMPRESSION:  1. 1.5 cm acute ischemic  nonhemorrhagic infarct involving the  paramedian superior right cerebellar vermis.  2. Irregular flow void within the proximal left V4 segment,  consistent with stenosis and attenuated flow as seen on previous  MRA.  3. Age-related cerebral atrophy with moderate chronic microvascular  ischemic disease. Small remote infarct at the inferior left  cerebellar tonsil."  ? ?Assessment / Plan / Recommendation ?Clinical Impression ? Pt seen for cognitive-linguistic evaluation. Pt alert, pleasant, and cooperative. Son at bedside. Both pt and son deny changes to cognitive-linguistic functioning this admission.  ? ?Pt assessed via informal means and portions of Cognistat. Pt demonstrated intact basic speech/language/cognitive ability. Pt A&Ox4. Pt's speech is fluent, appropriate, and without s/sx dysarthria or anomia. ? ?Suspect pt is at baseline cognitive-linguistic functioning. No f/u SLP services warranted at d/c. SLP to sign off given the above. ? ?Pt, pt's son, and RN made aware of results, recommendations, and SLP POC. Pt and son verbalize understanding/agreement.  ?   ?SLP Assessment ? SLP Recommendation/Assessment: Patient does not need any further [facility] Services ?SLP Visit Diagnosis: Cognitive communication deficit (R41.841)  ?  ?Recommendations for follow up therapy are one component of a multi-disciplinary discharge planning process, led by the attending physician.  Recommendations may be updated based on patient status, additional functional criteria and insurance authorization. ?   ?Follow Up Recommendations ? No SLP follow up  ?  ?Assistance Recommended at Discharge ?  (defer to OT/PT)  ?Functional Status Assessment Patient has not had a recent decline in their functional status  ?   ?SLP Evaluation ?Cognition ? Overall Cognitive Status: Within Functional Limits for tasks assessed ?Orientation Level: Oriented X4 ?Attention: Focused;Sustained ?Focused Attention: Appears intact ?Sustained  Attention: Appears intact ?Memory: Appears intact ?Awareness: Appears intact ?Problem Solving: Appears intact ?Executive Function: Reasoning;Sequencing ?Reasoning: Appears intact (extra time) ?Sequencing: Appears intact ?Safety/Judgment: Appears intact  ?  ?   ?Comprehension ? Auditory Comprehension ?Overall Auditory Comprehension: Appears within functional limits for tasks assessed ?Yes/No Questions: Within Functional Limits ?Commands: Within Functional Limits ?Visual Recognition/Discrimination ?Discrimination: Not tested ?Reading Comprehension ?Reading Status: Not tested  ?  ?Expression Expression ?Primary Mode of Expression: Verbal ?Verbal Expression ?Overall Verbal Expression: Appears within functional limits for tasks assessed ?Initiation: No impairment ?Automatic Speech:  (WFL; social automatics) ?Repetition: No impairment ?Naming: No impairment ?Pragmatics: No impairment ?Written Expression ?Written Expression: Not tested   ?Oral / Motor ? Oral Motor/Sensory Function ?Overall Oral Motor/Sensory Function: Within functional limits ?Motor Speech ?Overall Motor Speech: Appears within functional limits for tasks assessed ?Respiration: Within functional limits ?Phonation: Normal ?Resonance: Within functional limits ?Articulation: Within functional limitis ?Intelligibility: Intelligible ?Motor Planning: Witnin functional limits ?Motor Speech Errors: Not applicable   ?        ?Cherrie Gauze, M.S., CCC-SLP ?Speech-Language Pathologist ?[facility] ?[phone])  ? ?Quintella Baton ?06/28/2021, 9:01 AM ? ?

## 2021-06-28 NOTE — Consult Note (Signed)
Neurology Consultation ?Reason for Consult: Stroke on MRI ?Requesting Physician: Enzo Bi ? ?CC: Episode of weakness ? ?History is obtained from: Patient and chart review and family at bedside ? ?HPI: Aaron Stafford is a 87 y.o. male with a past medical history significant for coronary artery disease s/p CABG (6378), diastolic congestive heart failure (echo 06/12/2021 EF 58%, grade 2 diastolic dysfunction), hypertension, hyperlipidemia, diabetes type 2, cerebellar stroke (06/12/2021, on dual antiplatelet therapy), BPH. ? 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  However when asked about any head trauma or falls recently he notes that a few days prior to his admission in April he was trying to move some furniture and fell backwards with his head in extension.  He has not had any neck pain until yesterday while in the ED, and notes pain on neck movement that radiates into his head, improves with rest, and has been lessening in severity over the course of today.  He additionally had a groin rash for the past 2 months which has been improving after applying cream as recommended by his dermatologist, he feels like he has had increased propensity to lose his balance and leaning to the left and right in the past 1 month.  Denies any recent infectious symptoms; On full review of systems he denies any other significant recent concerns.   ? 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 ? ?LKW: 5/10 7 PM ?tPA given?: No, recent stroke ?Premorbid modified rankin scale:  ?    0 - No symptoms. ? ?ROS: All other review of systems was negative except as noted in the HPI.  ? ?Past Medical History:  ?Diagnosis Date  ? CAD (coronary artery disease)   ? a. 1995 s/p CABG x 4 (LIMA->LAD, VG->OM1->OM2, VG->RCA); b. 07/2009 PCI: LM 60, LAD 100ost, LCX 100p, RCA 100, LIMA->LAD atretic, VG->OM1->OM2 patent w/ collats to dLAD, VG->RCA 95 (3.5x15 Vision BMS). EF 55%; c. 02/2021 MV: EF 30% (50-55% by echo), large inferolateral scar w/ HK. No ischemia.  ? Chronic rhinitis   ? ENT [place]  ? COVID-19 virus infection 09/2019  ? Diabetes mellitus type II   ? Diastolic dysfunction   ? a. 03/2021 Echo: EF 50-55%, no rwma, GrI DD. RVSP 35.20mmHg. Mild MR.  ? Diverticulosis 06/2011  ? by CT and colonoscopy  ? Fatty liver 06/2011  ? by CT  ? GERD (gastroesophageal reflux disease)   ? History of BPH   ? History of shingles   ? HLD (hyperlipidemia)   ? HTN (hypertension)   ? Ischemic heart disease   ? Melanoma ([place]) 12/2015  ? L forearm, scalp  ? MI (myocardial infarction) ([place]) 1995  ? Shingles rash 09/26/2012  ?  Sialadenitis 05/01/2017  ? ?Past Surgical History:  ?Procedure Laterality Date  ? ANKLE SURGERY  03/2002  ? Left ankle fracture  ? CARDIAC CATHETERIZATION    ? CHOLECYSTECTOMY  1985  ? COLONOSCOPY    ? diverticulosis, tortuous colon with looping [name])  ? CORONARY ARTERY BYPASS GRAFT  05/1993  ? x6 MI Attemped angioplasty  ? CORONARY STENT PLACEMENT  07/25/2009  ? Multi-Link VISION Cobalt chromium   ? CYSTOSCOPY  10/03/08  ? Normal-Dr. Bernardo Heater  ? INGUINAL HERNIA REPAIR  11/2000  ? and ventral hernia repair  ? ?Current Outpatient Medications  ?Medication Instructions  ? Alpha-Lipoic Acid  600 mg, Oral, 2 times daily  ? aspirin 81 mg, Oral, Daily,    ? atorvastatin (LIPITOR) 40 mg, Oral, Daily  ? Blood Glucose Monitoring Suppl ([place]) w/Device KIT Check blood sugar once daily  ? clopidogrel (PLAVIX) 75 mg, Oral, Daily  ? Cranberry 1,000 mg, Oral, Daily  ? DULoxetine (CYMBALTA) 30 MG capsule TAKE 1 CAPSULE BY MOUTH EVERY DAY  ? finasteride (PROSCAR) 5 MG tablet TAKE 1 TABLET BY MOUTH EVERY DAY  ? fluticasone (FLONASE) 50 MCG/ACT nasal spray SPRAY 2 SPRAYS INTO EACH NOSTRIL EVERY DAY  ? furosemide (LASIX) 20 MG tablet TAKE 1 TABLET BY MOUTH EVERY DAY  ? gabapentin (NEURONTIN) 300 MG capsule TAKE 2 CAPSULES BY MOUTH TWICE A DAY  ? glucose blood (ONETOUCH VERIO) test strip Check blood sugar once daily  ? hydrochlorothiazide (HYDRODIURIL) 25 MG tablet TAKE 1 TABLET BY MOUTH EVERY DAY  ? isosorbide mononitrate (IMDUR) 15 mg, Oral, Daily  ? ketoconazole (NIZORAL) 2 % cream Topical  ? losartan (COZAAR) 50 MG tablet TAKE 1 TABLET BY MOUTH EVERY DAY  ? metFORMIN (GLUCOPHAGE) 500 MG tablet TAKE 1 TABLET BY MOUTH 2 TIMES DAILY WITH A MEAL.  ? mometasone (ELOCON) 0.1 % lotion Topical  ? OneTouch Delica Lancets 42V MISC Check blood sugar once daily  ? polyethylene glycol powder (GLYCOLAX/MIRALAX) 8.5-17 g, Oral, Daily PRN  ? traZODone (DESYREL) 50 MG tablet TAKE 1 tablet BY MOUTH AT BEDTIME AS NEEDED FOR SLEEP  ? Vitamin D3 1,000 Units, Oral, Daily  ? ? ? ?Family History  ?Problem Relation Age of Onset  ? Kidney failure Mother   ? Coronary artery disease Mother   ? Heart failure Mother   ? Hypertension Brother   ? Heart attack Brother   ? Alcohol abuse Brother   ? Hypertension Brother   ? Hypertension Sister   ? ?Social History:  reports that he has never smoked. He has never used smokeless tobacco. He reports that he does not drink alcohol and does not use drugs. ? ? ?Exam: ?Current vital signs: ?BP (!) 147/66   Pulse (!) 55   Temp 98.4 ?F (36.9 ?C) (Oral)   Resp 19   Ht $R'5\' 8"'Vg$  (1.727 m)    Wt 82 kg   SpO2 97%   BMI 27.49 kg/m?  ?Vital signs in last 24 hours: ?Temp:  [98.4 ?F (36.9 ?C)-98.6 ?F (37 ?C)] 98.4 ?F (36.9 ?C) (05/11 1230) ?Pulse Rate:  [50-77] 55 (05/11 1230) ?Resp:  [15-24] 19 (05/11 1230) ?BP: (138-156)/(58-80) 147/66 (05/11 1230) ?SpO2:  [92 %-99 %] 97 % (05/11 1230) ?Weight:  [82 kg] 82 kg (05/10 2146) ? ? ?Physical Exam  ?Constitutional: Appears well-developed and well-nourished.  ?Psych: Affect appropriate to situation, calm, cooperative, pleasant ?Eyes: No scleral injection ?HENT: No oropharyngeal obstruction.  ?MSK: no joint deformities.  ?Cardiovascular: Normal rate and  regular rhythm. Perfusing extremities well ?Respiratory: Effort normal, non-labored breathing ?GI: Soft.  No distension. There is no tenderness.  ?Skin: Warm dry and intact visible skin ? ?Neuro: ?Mental Status: ?Patient is awake, alert, oriented to person, place, month, year, and situation. ?Patient is able to give a clear and coherent history. ?No signs of aphasia or neglect ?Cranial Nerves: ?II: Visual Fields are full. Pupils are equal, round, and reactive to light.   ?III,IV, VI: EOMI without ptosis or diploplia.  ?V: Facial sensation is symmetric to temperature ?VII: Facial movement is symmetric.  ?VIII: hearing is intact to voice ?X: Uvula elevates symmetrically ?XI: Shoulder shrug is symmetric. ?XII: tongue is midline without atrophy or fasciculations.  ?Motor: ?Tone is normal. Bulk is normal. 5/5 strength was present in all four extremities.  ?Sensory: ?Sensation is symmetric to light touch and temperature in the arms and legs. ?Deep Tendon Reflexes: ?1+ and symmetric in the biceps and patellae.  ?Cerebellar: ?FNF and HKS are intact bilaterally ?Gait:  ?Deferred  ? ?NIHSS total 0 ?Performed at 2 PM on 5/10  ? ? ?I have reviewed labs in epic and the results pertinent to this consultation are: ? ? ?Basic Metabolic Panel: ?Recent Labs  ?Lab 06/27/21 ?2149 06/28/21 ?0342  ?NA 137  --   ?K 3.3*  --   ?CL 95*  --    ?CO2 30  --   ?GLUCOSE 194*  --   ?BUN 32*  --   ?CREATININE 1.12 0.94  ?CALCIUM 10.1  --   ? ? ?CBC: ?Recent Labs  ?Lab 06/27/21 ?2149 06/28/21 ?0342  ?WBC 13.5* 11.8*  ?HGB 13.9 13.1  ?HCT 41.8 39.2  ?[place]

## 2021-06-28 NOTE — Assessment & Plan Note (Signed)
Sliding scale insulin coverage.  Hold home metformin ?

## 2021-06-28 NOTE — Progress Notes (Signed)
OT Cancellation Note ? ?Patient Details ?Name: Aaron Stafford ?MRN: [id number] ?DOB: 08-03-1934 ? ? ?Cancelled Treatment:    Reason Eval/Treat Not Completed: OT screened, no needs identified, will sign off. Consult received, chart reviewed. Spoke with PT, pt at baseline, no OT needs at this time. Will sign off.  ? ?Ardeth Perfect., MPH, MS, OTR/L ?ascom [phone] ?06/28/21, 12:23 PM ? ?

## 2021-06-28 NOTE — ED Provider Notes (Signed)
Patient received in signout from Dr. Archie Balboa pending MRI brain to assess for recurrent stroke in the setting of balance issues this evening.  He was admitted about 3 weeks ago for an acute cerebellar stroke.  His MRI results with evidence of another acute stroke contralateral to the previous.  I reassessed the patient and he reports feeling okay while seated vertigo, but has significant balance issues when upright.  I recommended medical admission and he is agreeable.  I consulted medicine who agrees to admit. ?  ?Vladimir Crofts, MD ?06/28/21 [phone] ? ?

## 2021-06-28 NOTE — Assessment & Plan Note (Signed)
Continue aspirin, atorvastatin, Imdur ?

## 2021-06-28 NOTE — Assessment & Plan Note (Signed)
Holding losartan, furosemide for permissive hypertension ?

## 2021-06-28 NOTE — ED Notes (Signed)
Per Kennyth Lose RN she is ready for pt and turned it green. Send pt up ? ?

## 2021-06-29 ENCOUNTER — Other Ambulatory Visit (HOSPITAL_COMMUNITY): Payer: Self-pay

## 2021-06-29 DIAGNOSIS — I639 Cerebral infarction, unspecified: Secondary | ICD-10-CM | POA: Diagnosis not present

## 2021-06-29 LAB — CBC
HCT: 40.1 % (ref 39.0–52.0)
Hemoglobin: 13.5 g/dL (ref 13.0–17.0)
MCH: 29.3 pg (ref 26.0–34.0)
MCHC: 33.7 g/dL (ref 30.0–36.0)
MCV: 87 fL (ref 80.0–100.0)
Platelets: 248 10*3/uL (ref 150–400)
RBC: 4.61 MIL/uL (ref 4.22–5.81)
RDW: 12.8 % (ref 11.5–15.5)
WBC: 9.4 10*3/uL (ref 4.0–10.5)
nRBC: 0 % (ref 0.0–0.2)

## 2021-06-29 LAB — MAGNESIUM: Magnesium: 2.2 mg/dL (ref 1.7–2.4)

## 2021-06-29 LAB — BASIC METABOLIC PANEL
Anion gap: 6 (ref 5–15)
BUN: 21 mg/dL (ref 8–23)
CO2: 30 mmol/L (ref 22–32)
Calcium: 9.8 mg/dL (ref 8.9–10.3)
Chloride: 102 mmol/L (ref 98–111)
Creatinine, Ser: 0.99 mg/dL (ref 0.61–1.24)
GFR, Estimated: >60 mL/min (ref >60)
Glucose, Bld: 150 mg/dL — ABNORMAL HIGH (ref 70–99)
Potassium: 4.4 mmol/L (ref 3.5–5.1)
Sodium: 138 mmol/L (ref 135–145)

## 2021-06-29 LAB — GLUCOSE, CAPILLARY
Glucose-Capillary: 144 mg/dL — ABNORMAL HIGH (ref 70–99)
Glucose-Capillary: 141 mg/dL — ABNORMAL HIGH (ref 70–99)

## 2021-06-29 MED ORDER — TICAGRELOR 90 MG PO TABS: 90.0000 mg | ORAL_TABLET | Freq: Two times a day (BID) | ORAL | 2 refills | Status: DC

## 2021-06-29 NOTE — Progress Notes (Signed)
Neurology Progress Note ? ?Subjective: ?-Neck pain is improving ?-No new symptoms or concerns ? ?Exam: ?Vitals:  ? 06/29/21 0604 06/29/21 0808  ?BP: (!) 152/77 (!) 157/73  ?Pulse: 62 62  ?Resp: 17 16  ?Temp: 98.1 ?F (36.7 ?C) 97.6 ?F (36.4 ?C)  ?SpO2: 96% 95%  ? ?Gen: In bed, comfortable  ?Resp: non-labored breathing, no grossly audible wheezing ?Cardiac: Perfusing extremities well  ?Abd: soft, nt ? ?Neuro: ?MS: Conversant, appropriate, fully oriented ?CN: EOMI to tracking examiner, face symmetric, tongue midline ?Motor: Moving all 4 extremities grossly equally ? ?Pertinent data: ? ?MRA personally reviewed, agree with radiology that this is a stable examination ? ?Impression: Recurrent atheroembolic/small vessel stroke.  Lengthy discussion with the patient and family as well as pharmacy and primary team about adjustment of dual antiplatelet therapy ? ?Recommendations: ?-Switch Plavix to ticagrelor, patient to take 180 mg loading dose 24 hours after last dose of Plavix (take on 06/30/2021 around 9 AM), followed by 90 mg twice daily.  However discussed with patient if this is prohibitively expensive, he may remain on Plavix ?-Outpatient P2Y12 level to confirm he is a Plavix responder (given his demographics, unlikely to be a nonresponder), but unfortunately this lab cannot be obtained here ?-Continue 90 days of dual antiplatelet therapy followed by aspirin monotherapy lifelong ?-Continue home statin ? ?Lesleigh Noe MD-PhD ?[facility] ?[phone]  ?[facility] coverage for [facility] is from 8 AM to 4 AM in-house and 4 PM to 8 PM by telephone/video. 8 PM to 8 AM emergent questions or overnight urgent questions should be addressed to Teleneurology On-call or [facility] neurohospitalist; contact information can be found on AMION ? ?Greater than 50 minutes were spent in care of this patient today, greater than 50% of bedside and in direct care coordination ? ? ?

## 2021-06-29 NOTE — Discharge Summary (Signed)
? ?Physician Discharge Summary ? ? ?ILIAN Stafford  male DOB: 1934/03/10  ?KVQ:[id number] ? ?PCP: Ria Bush, MD ? ?Admit date: 06/27/2021 ?Discharge date: 06/29/2021 ? ?Admitted From: home ?Disposition:  home ?CODE STATUS: DNR ? ?Discharge Instructions   ? ? Ambulatory referral to Neurology   Complete by: As directed ?  ? Discharge instructions   Complete by: As directed ?  ? Neurologist has switched you from Plavix to ticagrelor (aka Brilinta).  Please take 180 mg (2 tablets) tomorrow morning on 5/12, then 90 mg (1 tab) night of 5/12.  Then afterwards, 90 mg twice daily for 3 months.  You will be on aspirin 81 mg daily indefinitely. ? ? ?Dr. Enzo Bi ?- ?-  ? ?  ? ?Hospital Course:  ?For full details, please see H&P, progress notes, consult notes and ancillary notes.  ?Briefly,  ?Aaron Stafford is a 87 y.o. 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 ? ?* Cerebellar stroke, acute , recurrent(HCC) ?--MRI brain showed 1.5 cm acute ischemic nonhemorrhagic infarct involving the paramedian superior right cerebellar vermis. ?--Per neuro rec, plavix was switched to ticagrelor, with a loading dose 180 mg on 5/13 morning followed by 90 mg BID for 3 months.   ?--Pt will be on ASA 81 daily indefinitely. ?-Outpatient P2Y12 level to confirm he is a Plavix responder (given his demographics, unlikely to be a nonresponder), but unfortunately this lab cannot be obtained here. ?--Continue home statin ?--outpatient f/u with neurology ?  ?Chronic diastolic CHF (congestive heart failure) (HCC) ?--home BP meds held for permissive HTN, and resumed after discharge (see below) ?  ?S/P CABG (coronary artery bypass graft) ?Continue aspirin, atorvastatin ?  ?Type 2 diabetes, controlled, with  peripheral neuropathy (HCC) ?--A1c 6.9, well controlled ?--resume home metformin after discharge. ?  ?HTN (hypertension) ?--home BP meds held for permissive HTN, and resumed after discharge (see below) ?  ? ?Discharge Diagnoses:  ?Principal Problem: ?  Cerebellar stroke, acute , recurrent(HCC) ?Active Problems: ?  HTN (hypertension) ?  Type 2 diabetes, controlled, with peripheral neuropathy ([place]) ?  S/P CABG (coronary artery bypass graft) ?  Chronic diastolic CHF (congestive heart failure) ([place]) ?  Cerebellar stroke ([place]) ?  Stroke [facility]) ? ? ?30 Day Unplanned Readmission Risk Score   ? ?Flowsheet Row ED to Hosp-Admission (Current) from 06/27/2021 in [facility]  ?30 Day Unplanned Readmission Risk Score (%) 10.18 Filed at 06/29/2021 1200  ? ?  ? ? This score is the patient's risk of an unplanned readmission within 30 days of being discharged (0 -100%). The score is based on dignosis, age, lab data, medications, orders, and past utilization.   ?Low:  0-14.9   Medium: 15-21.9   High: 22-29.9   Extreme: 30 and above ? ?  ? ?  ? ? ?Discharge Instructions: ? ?Allergies as of 06/29/2021   ? ?   Reactions  ? Morphine Sulfate   ? REACTION: Hallucinations  ? Nitroglycerin   ? REACTION: Decreased BP  ? Contrast Media [iodinated Contrast Media] Rash  ? Severe hives  ? ?  ? ?  ?Medication List  ?  ? ?STOP taking these medications   ? ?clopidogrel 75 MG tablet ?Commonly known as: PLAVIX ?  ? ?  ? ?  TAKE these medications   ? ?Alpha-Lipoic Acid 600 MG Caps ?Take 1 capsule (600 mg total) by mouth 2 (two) times daily. ?  ?aspirin 81 MG tablet ?Take 81 mg by mouth daily. ?  ?atorvastatin 40 MG tablet ?Commonly known as: Lipitor ?Take 1 tablet (40 mg total) by mouth daily. ?  ?Cranberry 500 MG Caps ?Take 2 capsules (1,000 mg total) by mouth daily. ?  ?DULoxetine 30 MG capsule ?Commonly known as: CYMBALTA ?TAKE 1 CAPSULE BY MOUTH EVERY DAY ?  ?finasteride 5 MG tablet ?Commonly known as:  PROSCAR ?TAKE 1 TABLET BY MOUTH EVERY DAY ?  ?fluticasone 50 MCG/ACT nasal spray ?Commonly known as: FLONASE ?SPRAY 2 SPRAYS INTO EACH NOSTRIL EVERY DAY ?  ?furosemide 20 MG tablet ?Commonly known as: LASIX ?TAKE 1 TABLET BY MOUTH EVERY DAY ?  ?gabapentin 300 MG capsule ?Commonly known as: NEURONTIN ?TAKE 2 CAPSULES BY MOUTH TWICE A DAY ?  ?hydrochlorothiazide 25 MG tablet ?Commonly known as: HYDRODIURIL ?TAKE 1 TABLET BY MOUTH EVERY DAY ?  ?isosorbide mononitrate 30 MG 24 hr tablet ?Commonly known as: IMDUR ?Take 0.5 tablets (15 mg total) by mouth daily. ?  ?ketoconazole 2 % cream ?Commonly known as: NIZORAL ?Apply topically. ?  ?losartan 50 MG tablet ?Commonly known as: COZAAR ?TAKE 1 TABLET BY MOUTH EVERY DAY ?  ?metFORMIN 500 MG tablet ?Commonly known as: GLUCOPHAGE ?TAKE 1 TABLET BY MOUTH 2 TIMES DAILY WITH A MEAL. ?  ?mometasone 0.1 % lotion ?Commonly known as: ELOCON ?Apply topically. ?  ?OneTouch Delica Lancets 02O Misc ?Check blood sugar once daily ?  ?OneTouch Verio Flex System w/Device Kit ?Check blood sugar once daily ?  ?OneTouch Verio test strip ?Generic drug: glucose blood ?Check blood sugar once daily ?  ?polyethylene glycol powder 17 GM/SCOOP powder ?Commonly known as: GLYCOLAX/MIRALAX ?TAKE 8.5-17 G BY MOUTH DAILY AS NEEDED FOR MODERATE CONSTIPATION. ?  ?ticagrelor 90 MG Tabs tablet ?Commonly known as: BRILINTA ?Take 1 tablet (90 mg total) by mouth 2 (two) times daily. ?  ?traZODone 50 MG tablet ?Commonly known as: DESYREL ?TAKE 1 tablet BY MOUTH AT BEDTIME AS NEEDED FOR SLEEP ?  ?Vitamin D3 25 MCG (1000 UT) Caps ?Take 1 capsule (1,000 Units total) by mouth daily. ?  ? ?  ? ? ? Follow-up Information   ? ? Ria Bush, MD Follow up in 1 week(s).   ?Specialty: Family Medicine ?Contact information: ?[address] ?[place] [address] ?[phone] ? ? ?  ?  ? ? Anabel Bene, MD Follow up in 1 month(s).   ?Specialty: Neurology ?Contact information: ?[address] ?[facility] ?[place] [address] ?[phone] ? ? ?  ?  ? ?  ?  ? ?  ? ? ?Allergies  ?Allergen Reactions  ? Morphine Sulfate   ?  REACTION: Hallucinations  ? Nitroglycerin   ?  REACTION: Decreased BP  ? Contrast Media [Iodinated Contrast Media] Rash  ?  Severe hives  ? ? ? ?The results of significant diagnostics from this hospitalization (including imaging, microbiology, ancillary and laboratory) are listed below for reference.  ? ?Consultations: ? ? ?Procedures/Studies: ?CT HEAD WO CONTRAST ? ?Result Date: 06/12/2021 ?CLINICAL DATA:  Altered speech, bilateral leg weakness EXAM: CT HEAD WITHOUT CONTRAST TECHNIQUE: Contiguous axial images were obtained from the base of the skull through the vertex without intravenous contrast. RADIATION DOSE REDUCTION: This exam was performed according to the departmental dose-optimization program which includes automated exposure control, adjustment of the mA  and/or kV according to patient size and/or use of iterative reconstruction technique. COMPARISON:  None. FINDINGS: Brain: There is no evidence of acute intracranial hemorrhage, extra-axial fluid collection, or acute infarct. There is mild global parenchymal volume loss with prominence of the ventricular system and extra-axial CSF spaces. Patchy hypodensity in the subcortical and periventricular white matter likely reflects sequela of chronic white matter microangiopathy. There is no mass lesion.  There is no mass effect or midline shift. Vascular: No hyperdense vessel or unexpected calcification. Skull: Normal. Negative for fracture or focal lesion. Sinuses/Orbits: There is mucosal thickening in the right sphenoid sinus. Globes and orbits are unremarkable. Other: None. IMPRESSION: No acute intracranial pathology. Electronically Signed   By: Valetta Mole M.D.   On: 06/12/2021 08:06  ? ?MR ANGIO HEAD WO CONTRAST ? ?Result Date: 06/28/2021 ?CLINICAL DATA:  Carotid artery stenosis screening, concern for dissection, acute  infarct on 06/27/2021 EXAM: MRA NECK WITHOUT AND WITH CONTRAST MRA HEAD WITHOUT CONTRAST TECHNIQUE: Multiplanar and multiecho pulse sequences of the neck were obtained without and with intravenous contrast. Angiog

## 2021-06-29 NOTE — TOC Benefit Eligibility Note (Signed)
Patient Advocate Encounter ? ?Insurance verification completed.   ? ?The patient is currently admitted and upon discharge could be taking Brilinta 90 mg. ? ?The current 30 day co-pay is, $236.68.  ? ?The patient is insured through [employer] Medicare Part D  ? ? ?Lyndel Safe, CPhT ?Pharmacy Patient Advocate Specialist ?[facility] Patient Advocate Team ?Direct Number: [phone]  Fax: [phone] ? ? ? ? ? ?  ?

## 2021-07-02 NOTE — Progress Notes (Signed)
Cardiology Office Note ? ?Date:  07/03/2021  ? ?ID:  Aaron Stafford, DOB [birth year], MRN [id number] ? ?PCP:  Ria Bush, MD  ? ?Chief Complaint  ?Patient presents with  ? 3 month follow up   ?  "Doing well." Medications reviewed by the patient verbally.   ? ? ?HPI:  ?Aaron Stafford is a very pleasant 87 year old gentleman with a history of  ?coronary artery disease,   ?bypass surgery, With LIMA graft to the LAD, vein graft to the RCA, vein graft to the OM  ?previous stent placed to the vein graft to the RCA in June 2011,  ?hyperlipidemia,  ?hypertension  ?Kidney stones ?who presents for routine follow up of his coronary artery disease, CVA.  ? ? 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  ?[place] 3/21 ?Recent admission to the hospital for stroke April 2023 ?difficulty speaking 06/12/21.  ?fell accidentally when he was moving furniture at home at about 9 PM night before ?Developed neurologic symptoms next morning 6 AM, weakness both legs ?Evaluated by neurology ? ?Currently on aspirin with Brilinta 90 twice daily ?Having some balance issues on select days, walking for exercise at church ?No recent falls but does report prior history of falls ? ?Hospital imaging reviewed ?MRI/MRA imaging reviewed on today's visit ?1. Small acute infarct left cerebellar tonsil. The left vertebral ?artery is occluded at the origin. There is a severe stenosis in the ?left V4 segment. Left PICA not visualized. ?2. Mild stenosis proximal and distal right vertebral artery.  But ?3. Both carotid arteries are patent to the skull base without ?significant stenosis. ?4. Atrophy and mild to moderate chronic microvascular ischemia. ?  ?2D echo showed EF of 50-55% with grade 2 diastolic dysfunction, also showed some aortic valve abnormalities.  ? ?Overall feels he has made a good recovery ?Feels blood pressure is well controlled on current medication regiment ?No near-syncope or syncope ?No leg edema, no chest pain concerning for angina ? ?EKG personally  reviewed by myself on todays visit ?Normal sinus rhythm rate 60 bpm right bundle branch block, left anterior fascicular block ?No change from prior EKG 2021 ? ? ?PMH:   has a past medical history of CAD (coronary artery disease), Chronic rhinitis, COVID-19 virus infection (09/2019), Diabetes mellitus type II, Diastolic dysfunction, Diverticulosis (06/2011), Fatty liver (06/2011), GERD (gastroesophageal reflux disease), History of BPH, History of shingles, HLD (hyperlipidemia), HTN (hypertension), Ischemic heart disease, Melanoma ([place]) (12/2015), MI (myocardial infarction) ([place]) (1995), Shingles rash (09/26/2012), and Sialadenitis (05/01/2017). ? ?PSH:    ?Past Surgical History:  ?Procedure Laterality Date  ? ANKLE SURGERY  03/2002  ? Left ankle fracture  ? CARDIAC CATHETERIZATION    ? CHOLECYSTECTOMY  1985  ? COLONOSCOPY    ? diverticulosis, tortuous colon with looping [name])  ? CORONARY ARTERY BYPASS GRAFT  05/1993  ? x6 MI Attemped angioplasty  ? CORONARY STENT PLACEMENT  07/25/2009  ? Multi-Link VISION Cobalt chromium   ? CYSTOSCOPY  10/03/08  ? Normal-Dr. Bernardo Heater  ? INGUINAL HERNIA REPAIR  11/2000  ? and ventral hernia repair  ? ? ?Current Outpatient Medications  ?Medication Sig Dispense Refill  ? Alpha-Lipoic Acid 600 MG CAPS Take 1 capsule (600 mg total) by mouth 2 (two) times daily.    ? aspirin 81 MG tablet Take 81 mg by mouth daily.    ? atorvastatin (LIPITOR) 40 MG tablet Take 1 tablet (40 mg total) by mouth daily. 90 tablet 0  ? Blood Glucose Monitoring Suppl ([place]) w/Device  KIT Check blood sugar once daily 1 kit 0  ? Cholecalciferol (VITAMIN D3) 1000 UNITS CAPS Take 1 capsule (1,000 Units total) by mouth daily. 30 capsule   ? Cranberry 500 MG CAPS Take 2 capsules (1,000 mg total) by mouth daily.    ? DULoxetine (CYMBALTA) 30 MG capsule TAKE 1 CAPSULE BY MOUTH EVERY DAY 90 capsule 1  ? finasteride (PROSCAR) 5 MG tablet TAKE 1 TABLET BY MOUTH EVERY DAY 90 tablet 1  ? fluticasone  (FLONASE) 50 MCG/ACT nasal spray SPRAY 2 SPRAYS INTO EACH NOSTRIL EVERY DAY 48 mL 3  ? furosemide (LASIX) 20 MG tablet TAKE 1 TABLET BY MOUTH EVERY DAY 90 tablet 3  ? gabapentin (NEURONTIN) 300 MG capsule TAKE 2 CAPSULES BY MOUTH TWICE A DAY 360 capsule 1  ? glucose blood (ONETOUCH VERIO) test strip Check blood sugar once daily 100 strip 3  ? hydrochlorothiazide (HYDRODIURIL) 25 MG tablet TAKE 1 TABLET BY MOUTH EVERY DAY 90 tablet 3  ? ketoconazole (NIZORAL) 2 % cream Apply topically.    ? losartan (COZAAR) 50 MG tablet TAKE 1 TABLET BY MOUTH EVERY DAY 90 tablet 2  ? metFORMIN (GLUCOPHAGE) 500 MG tablet TAKE 1 TABLET BY MOUTH 2 TIMES DAILY WITH A MEAL. 180 tablet 3  ? mometasone (ELOCON) 0.1 % lotion Apply topically.    ? OneTouch Delica Lancets 41L MISC Check blood sugar once daily 100 each 3  ? polyethylene glycol powder (GLYCOLAX/MIRALAX) 17 GM/SCOOP powder TAKE 8.5-17 G BY MOUTH DAILY AS NEEDED FOR MODERATE CONSTIPATION. 510 g 1  ? ticagrelor (BRILINTA) 90 MG TABS tablet Take 1 tablet (90 mg total) by mouth 2 (two) times daily. 60 tablet 2  ? traZODone (DESYREL) 50 MG tablet TAKE 1 tablet BY MOUTH AT BEDTIME AS NEEDED FOR SLEEP 90 tablet 3  ? isosorbide mononitrate (IMDUR) 30 MG 24 hr tablet Take 0.5 tablets (15 mg total) by mouth daily. 90 tablet 1  ? ?No current facility-administered medications for this visit.  ? ? ?Allergies:   Morphine sulfate, Nitroglycerin, and Contrast media [iodinated contrast media]  ? ?Social History:  The patient  reports that he has never smoked. He has never used smokeless tobacco. He reports that he does not drink alcohol and does not use drugs.  ? ?Family History:   family history includes Alcohol abuse in his brother; Coronary artery disease in his mother; Heart attack in his brother; Heart failure in his mother; Hypertension in his brother, brother, and sister; Kidney failure in his mother.  ? ? ?Review of Systems: ?Review of Systems  ?Constitutional: Negative.   ?HENT:  Negative.    ?Respiratory: Negative.    ?Cardiovascular: Negative.   ?Gastrointestinal: Negative.   ?Musculoskeletal: Negative.   ?Neurological: Negative.   ?Psychiatric/Behavioral: Negative.    ?All other systems reviewed and are negative. ? ?PHYSICAL EXAM: ?VS:  BP 140/72 (BP Location: Left Arm, Patient Position: Sitting, Cuff Size: Normal)   Ht $R'5\' 8"'lU$  (1.727 m)   Wt 173 lb 4 oz (78.6 kg)   SpO2 98%   BMI 26.34 kg/m?  , BMI Body mass index is 26.34 kg/m?. ?GEN: Well nourished, well developed, in no acute distress  ?HEENT: normal  ?Neck: no JVD, carotid bruits, or masses ?Cardiac: RRR; no murmurs, rubs, or gallops,no edema  ?Respiratory:  clear to auscultation bilaterally, normal work of breathing ?GI: soft, nontender, nondistended, + BS ?MS: no deformity or atrophy  ?Skin: warm and dry, no rash ?Neuro:  Strength and sensation are intact ?Psych:  euthymic mood, full affect ? ?Recent Labs: ?08/16/2020: ALT 18 ?02/23/2021: TSH 3.35 ?06/12/2021: B Natriuretic Peptide 88.3 ?06/29/2021: BUN 21; Creatinine, Ser 0.99; Hemoglobin 13.5; Magnesium 2.2; Platelets 248; Potassium 4.4; Sodium 138  ? ? ?Lipid Panel ?Lab Results  ?Component Value Date  ? CHOL 116 06/13/2021  ? HDL 28 (L) 06/13/2021  ? LDLCALC 46 06/13/2021  ? TRIG 208 (H) 06/13/2021  ? ? ?Wt Readings from Last 3 Encounters:  ?07/03/21 173 lb 4 oz (78.6 kg)  ?06/28/21 174 lb 13.2 oz (79.3 kg)  ?06/12/21 179 lb (81.2 kg)  ?  ? ?ASSESSMENT AND PLAN: ? ?History of stroke ?Reported 2 strokes, back to back ?Good recovery, slight days with balance issues ?April 2023, presumably secondary to cerebrovascular disease ?Has been seen by neurology, started on Brilinta 90 mg twice daily with aspirin for several months ?Cholesterol at goal ? ?Atherosclerosis of native coronary artery of native heart with stable angina pectoris ([place]) - Plan: EKG 12-Lead ?Currently with no symptoms of angina. No further workup at this time. Continue current medication regimen. ? ?Type 2 diabetes,  controlled, with peripheral neuropathy ([place]) - Plan: EKG 12-Lead ?Hemoglobin A1c with general well controlled, weight stable if not trending down ? ?S/P CABG (coronary artery bypass graft) - Plan: EKG 12-Lead ?Doing well sinc

## 2021-07-03 ENCOUNTER — Encounter: Payer: Self-pay | Admitting: Cardiovascular Disease

## 2021-07-03 ENCOUNTER — Ambulatory Visit (INDEPENDENT_AMBULATORY_CARE_PROVIDER_SITE_OTHER): Payer: Medicare Other | Admitting: Cardiovascular Disease

## 2021-07-03 VITALS — BP 140/72 | HR 60 | Ht 68.0 in | Wt 173.2 lb

## 2021-07-03 DIAGNOSIS — I5032 Chronic diastolic (congestive) heart failure: Secondary | ICD-10-CM

## 2021-07-03 DIAGNOSIS — E1140 Type 2 diabetes mellitus with diabetic neuropathy, unspecified: Secondary | ICD-10-CM | POA: Diagnosis not present

## 2021-07-03 DIAGNOSIS — E785 Hyperlipidemia, unspecified: Secondary | ICD-10-CM

## 2021-07-03 DIAGNOSIS — I25110 Atherosclerotic heart disease of native coronary artery with unstable angina pectoris: Secondary | ICD-10-CM | POA: Diagnosis not present

## 2021-07-03 DIAGNOSIS — G459 Transient cerebral ischemic attack, unspecified: Secondary | ICD-10-CM | POA: Diagnosis not present

## 2021-07-03 DIAGNOSIS — I200 Unstable angina: Secondary | ICD-10-CM | POA: Diagnosis not present

## 2021-07-03 DIAGNOSIS — I639 Cerebral infarction, unspecified: Secondary | ICD-10-CM

## 2021-07-03 DIAGNOSIS — E1142 Type 2 diabetes mellitus with diabetic polyneuropathy: Secondary | ICD-10-CM | POA: Diagnosis not present

## 2021-07-03 DIAGNOSIS — I10 Essential (primary) hypertension: Secondary | ICD-10-CM

## 2021-07-03 DIAGNOSIS — Z951 Presence of aortocoronary bypass graft: Secondary | ICD-10-CM

## 2021-07-03 NOTE — Patient Instructions (Signed)
Monitor blood pressure ? ?Medication Instructions:  ?No changes ? ?If you need a refill on your cardiac medications before your next appointment, please call your pharmacy.  ? ?Lab work: ?No new labs needed ? ?Testing/Procedures: ?No new testing needed ? ?Follow-Up: ?At [facility], you and your health needs are our priority.  As part of our continuing mission to provide you with exceptional heart care, we have created designated Provider Care Teams.  These Care Teams include your primary Cardiologist (physician) and Advanced Practice Providers (APPs -  Physician Assistants and Nurse Practitioners) who all work together to provide you with the care you need, when you need it. ? ?You will need a follow up appointment in 12 months ? ?Providers on your designated Care Team:   ?Murray Hodgkins, NP ?Christell Faith, PA-C ?Cadence Kathlen Mody, PA-C ? ?COVID-19 Vaccine Information can be found at: [email or web address].co.uk For questions related to vaccine distribution or appointments, please email [email or web address] or call [phone].  ? ?

## 2021-07-05 ENCOUNTER — Telehealth: Payer: Self-pay | Admitting: Family Medicine

## 2021-07-05 LAB — CULTURE, BLOOD (ROUTINE X 2)
Culture: NO GROWTH
Culture: NO GROWTH
Special Requests: ADEQUATE

## 2021-07-05 NOTE — Telephone Encounter (Signed)
Recent hospitalization x2 for stroke. TCM phone call not performed.  Plz call to see how he's recovering and offer hosp f/u with me sooner than 08/2021

## 2021-07-06 NOTE — Telephone Encounter (Signed)
Spoke with pt asking for an update.  Says he's really weak and slow to speak but doing well otherwise.  Offered hosp f/u on 07/09/21 at 9:30, pt accepted.

## 2021-07-09 ENCOUNTER — Ambulatory Visit (INDEPENDENT_AMBULATORY_CARE_PROVIDER_SITE_OTHER): Payer: Medicare Other | Admitting: Family Medicine

## 2021-07-09 ENCOUNTER — Encounter: Payer: Self-pay | Admitting: Family Medicine

## 2021-07-09 VITALS — BP 124/72 | HR 68 | Temp 97.7°F | Ht 68.0 in | Wt 175.5 lb

## 2021-07-09 DIAGNOSIS — E1169 Type 2 diabetes mellitus with other specified complication: Secondary | ICD-10-CM

## 2021-07-09 DIAGNOSIS — I639 Cerebral infarction, unspecified: Secondary | ICD-10-CM

## 2021-07-09 DIAGNOSIS — E1140 Type 2 diabetes mellitus with diabetic neuropathy, unspecified: Secondary | ICD-10-CM

## 2021-07-09 DIAGNOSIS — I200 Unstable angina: Secondary | ICD-10-CM | POA: Diagnosis not present

## 2021-07-09 DIAGNOSIS — E785 Hyperlipidemia, unspecified: Secondary | ICD-10-CM | POA: Diagnosis not present

## 2021-07-09 NOTE — Progress Notes (Signed)
Patient ID: Aaron Stafford, male    DOB: 18-Jan-1935, 87 y.o.   MRN: [id number]  This visit was conducted in person.  BP 124/72   Pulse 68   Temp 97.7 F (36.5 C) (Temporal)   Ht _0  (1.727 m)   Wt 175 lb 8 oz (79.6 kg)   SpO2 96%   BMI 26.68 kg/m    CC: hosp f/u visit  Subjective:   HPI: Aaron Stafford is a 87 y.o. male presenting on 07/09/2021 for Hospitalization Follow-up (Admitted on 06/27/21 at [facility], dx CVA [x2].   Accompanied by wife, [name]. )   Recent hospitalization x2 for acute stroke presenting with difficulty speaking, fall, and bilateral leg weakness.  MRI/MRA showed small acute infarct of left cerebellar tonsil with occluded left vertebral artery, and severe stenosis of left V4 segment.  There is also evidence of atrophy and mild to moderate chronic microvascular ischemia.  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  He was initially started on aspirin plus Plavix, this was switched to Brilinta Brilinta 90 mg twice daily plus aspirin after the second stroke, planned for the next several months.  Pravastatin was also changed to high potency atorvastatin 40 mg daily.  Carotid arteries were patent. Hospital records reviewed. Med rec performed. 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  Home health not set up - not needed.  Other follow up appointments scheduled: Saw cardiology Dr. Rockey Situ last week. Neurology appt not set up yet. Will refer to [facility] today.  ______________________________________________________________________ Hospital admission: 06/27/2021 Hospital discharge: 06/29/2021 TCM f/u phone call: not performed   Discharge Diagnoses:  Principal Problem:   Cerebellar stroke, acute , recurrent(HCC) Active Problems:   HTN (hypertension)   Type 2  diabetes, controlled, with peripheral neuropathy (HCC)   S/P CABG (coronary artery bypass graft)   Chronic diastolic CHF (congestive heart failure) (HCC)   Cerebellar stroke ([place])   Stroke ([place])     Relevant past medical, surgical, family and social history reviewed and updated as indicated. Interim medical history since our last visit reviewed. Allergies and medications reviewed and updated. Outpatient Medications Prior to Visit  Medication Sig Dispense Refill   Alpha-Lipoic Acid 600 MG CAPS Take 1 capsule (600 mg total) by mouth 2 (two) times daily.     aspirin 81 MG tablet Take 81 mg by mouth daily.     atorvastatin (LIPITOR) 40 MG tablet Take 1 tablet (40 mg total) by mouth daily. 90 tablet 0   Blood Glucose Monitoring Suppl (ONETOUCH VERIO FLEX SYSTEM) w/Device KIT Check blood sugar once daily 1 kit 0   DULoxetine (CYMBALTA) 30 MG capsule TAKE 1 CAPSULE BY MOUTH EVERY DAY 90 capsule 1   finasteride (PROSCAR) 5 MG tablet TAKE 1 TABLET BY MOUTH EVERY DAY 90 tablet 1   fluticasone (FLONASE) 50 MCG/ACT nasal spray SPRAY 2 SPRAYS INTO EACH NOSTRIL EVERY DAY 48 mL 3   furosemide (LASIX) 20 MG tablet TAKE 1 TABLET BY MOUTH EVERY DAY 90 tablet 3   gabapentin (NEURONTIN) 300 MG capsule TAKE 2 CAPSULES BY MOUTH TWICE A DAY 360 capsule 1   glucose blood (ONETOUCH VERIO) test strip Check blood sugar once daily 100 strip 3   hydrochlorothiazide (HYDRODIURIL) 25 MG tablet TAKE 1 TABLET BY MOUTH EVERY DAY  90 tablet 3   ketoconazole (NIZORAL) 2 % cream Apply topically.     losartan (COZAAR) 50 MG tablet TAKE 1 TABLET BY MOUTH EVERY DAY 90 tablet 2   metFORMIN (GLUCOPHAGE) 500 MG tablet TAKE 1 TABLET BY MOUTH 2 TIMES DAILY WITH A MEAL. 180 tablet 3   mometasone (ELOCON) 0.1 % lotion Apply topically.     OneTouch Delica Lancets 24P MISC Check blood sugar once daily 100 each 3   polyethylene glycol powder (GLYCOLAX/MIRALAX) 17 GM/SCOOP powder TAKE 8.5-17 G BY MOUTH DAILY AS NEEDED FOR MODERATE  CONSTIPATION. 510 g 1   ticagrelor (BRILINTA) 90 MG TABS tablet Take 1 tablet (90 mg total) by mouth 2 (two) times daily. 60 tablet 2   traZODone (DESYREL) 50 MG tablet TAKE 1 tablet BY MOUTH AT BEDTIME AS NEEDED FOR SLEEP 90 tablet 3   Cholecalciferol (VITAMIN D3) 1000 UNITS CAPS Take 1 capsule (1,000 Units total) by mouth daily. (Patient not taking: Reported on 07/09/2021) 30 capsule    Cranberry 500 MG CAPS Take 2 capsules (1,000 mg total) by mouth daily. (Patient not taking: Reported on 07/09/2021)     isosorbide mononitrate (IMDUR) 30 MG 24 hr tablet Take 0.5 tablets (15 mg total) by mouth daily. 90 tablet 1   No facility-administered medications prior to visit.     Per HPI unless specifically indicated in ROS section below Review of Systems  Objective:  BP 124/72   Pulse 68   Temp 97.7 F (36.5 C) (Temporal)   Ht _0  (1.727 m)   Wt 175 lb 8 oz (79.6 kg)   SpO2 96%   BMI 26.68 kg/m   Wt Readings from Last 3 Encounters:  07/09/21 175 lb 8 oz (79.6 kg)  07/03/21 173 lb 4 oz (78.6 kg)  06/28/21 174 lb 13.2 oz (79.3 kg)      Physical Exam Vitals and nursing note reviewed.  Constitutional:      Appearance: Normal appearance. He is not ill-appearing.  Neck:     Vascular: No carotid bruit.  Cardiovascular:     Rate and Rhythm: Normal rate and regular rhythm.     Pulses: Normal pulses.     Heart sounds: Murmur (3/6 systolic) heard.  Pulmonary:     Effort: Pulmonary effort is normal. No respiratory distress.     Breath sounds: Normal breath sounds. No wheezing, rhonchi or rales.  Musculoskeletal:     Cervical back: Normal range of motion and neck supple.     Right lower leg: No edema.     Left lower leg: No edema.  Skin:    General: Skin is warm and dry.     Findings: No rash.  Neurological:     Mental Status: He is alert.  Psychiatric:        Mood and Affect: Mood normal.        Behavior: Behavior normal.       Assessment & Plan:   Problem List Items Addressed  This Visit     Hyperlipidemia associated with type 2 diabetes mellitus ([place])    Doing well on atorvastatin.  We will check labs in 2 months at his physical.       Relevant Orders   Ambulatory referral to Neurology   Type 2 diabetes mellitus with diabetic neuropathy, unspecified ([place])    Overall stable time on metformin.       Relevant Orders   Ambulatory referral to Neurology   Cerebellar stroke, acute , recurrent(HCC) - Primary  Status post 2 recent cerebellar strokes, first on left and second on right.  He is now on aspirin and Brilinta for 3 months with plan to transition to plain aspirin indefinitely.  He is also now on atorvastatin 40 mg daily.  He seems to be tolerating medication changes well.  Will refer to neurology to establish care. 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      Relevant Orders   Ambulatory referral to Neurology     No orders of the defined types were placed in this encounter.  Orders Placed This Encounter  Procedures   Ambulatory referral to Neurology    Referral Priority:   Routine    Referral Type:   Consultation    Referral Reason:   Specialty Services Required    Requested Specialty:   Neurology    Number of Visits Requested:   1     Patient Instructions  We will refer you to neurology in [place].  I agree with slowing down for work.  Good to see you today. I'm glad you're recovering so well after the 2 strokes! Keep your appointment for July.   Follow up plan: Return in about 2 months (around 09/08/2021).  Ria Bush, MD

## 2021-07-09 NOTE — Assessment & Plan Note (Addendum)
Status post 2 recent cerebellar strokes, first on left and second on right.  He is now on aspirin and Brilinta for 3 months with plan to transition to plain aspirin indefinitely.  He is also now on atorvastatin 40 mg daily.  He seems to be tolerating medication changes well.  Will refer to neurology to establish care. 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

## 2021-07-09 NOTE — Assessment & Plan Note (Signed)
Doing well on atorvastatin.  We will check labs in 2 months at his physical.

## 2021-07-09 NOTE — Patient Instructions (Addendum)
We will refer you to neurology in [place].  I agree with slowing down for work.  Good to see you today. I'm glad you're recovering so well after the 2 strokes! Keep your appointment for July.

## 2021-07-09 NOTE — Assessment & Plan Note (Signed)
Overall stable time on metformin.

## 2021-07-09 NOTE — Assessment & Plan Note (Deleted)
Status post 2 recent cerebellar strokes, first on left and second on right.  He is now on aspirin and Brilinta for 3 months with plan to transition to plain aspirin indefinitely.  He is also now on atorvastatin 40 mg daily.  He seems to be tolerating medication changes well.  Will refer to neurology to establish care.

## 2021-07-30 DIAGNOSIS — G629 Polyneuropathy, unspecified: Secondary | ICD-10-CM | POA: Diagnosis not present

## 2021-07-30 DIAGNOSIS — Z8673 Personal history of transient ischemic attack (TIA), and cerebral infarction without residual deficits: Secondary | ICD-10-CM | POA: Diagnosis not present

## 2021-07-30 DIAGNOSIS — G250 Essential tremor: Secondary | ICD-10-CM | POA: Diagnosis not present

## 2021-07-30 DIAGNOSIS — R29818 Other symptoms and signs involving the nervous system: Secondary | ICD-10-CM | POA: Diagnosis not present

## 2021-08-12 ENCOUNTER — Other Ambulatory Visit: Payer: Self-pay | Admitting: Family Medicine

## 2021-08-16 ENCOUNTER — Ambulatory Visit (INDEPENDENT_AMBULATORY_CARE_PROVIDER_SITE_OTHER): Payer: Medicare Other | Admitting: *Deleted

## 2021-08-16 DIAGNOSIS — Z0000 Encounter for general adult medical examination without abnormal findings: Secondary | ICD-10-CM

## 2021-08-16 NOTE — Progress Notes (Signed)
Subjective:   Aaron Stafford is a 87 y.o. male who presents for Medicare Annual/Subsequent preventive examination.  I connected with  Aaron Stafford on 08/16/21 by a telephone enabled telemedicine application and verified that I am speaking with the correct person using two identifiers.   I discussed the limitations of evaluation and management by telemedicine. The patient expressed understanding and agreed to proceed.  Patient location: home  Provider location: Tele-health-home    Review of Systems           Objective:    There were no vitals filed for this visit. There is no height or weight on file to calculate BMI.     06/28/2021   10:26 AM 06/27/2021    9:48 PM 06/12/2021   10:00 AM 06/12/2021    7:45 AM 04/13/2021    9:57 AM 08/15/2020    9:53 AM 12/18/2019    2:37 AM  Advanced Directives  Does Patient Have a Medical Advance Directive? No No Yes Yes Yes Yes No  Type of Advance Directive Living will  Living will Living will [place];Living will [place];Living will   Does patient want to make changes to medical advance directive? No - Patient declined  No - Patient declined  No - Patient declined    Copy of [place] in Chart?      No - copy requested   Would patient like information on creating a medical advance directive? No - Patient declined          Current Medications (verified) Outpatient Encounter Medications as of 08/16/2021  Medication Sig   Alpha-Lipoic Acid 600 MG CAPS Take 1 capsule (600 mg total) by mouth 2 (two) times daily.   aspirin 81 MG tablet Take 81 mg by mouth daily.   atorvastatin (LIPITOR) 40 MG tablet Take 1 tablet (40 mg total) by mouth daily.   Blood Glucose Monitoring Suppl (ONETOUCH VERIO FLEX SYSTEM) w/Device KIT Check blood sugar once daily   Cholecalciferol (VITAMIN D3) 1000 UNITS CAPS Take 1 capsule (1,000 Units total) by mouth daily. (Patient not taking: Reported on  07/09/2021)   Cranberry 500 MG CAPS Take 2 capsules (1,000 mg total) by mouth daily. (Patient not taking: Reported on 07/09/2021)   DULoxetine (CYMBALTA) 30 MG capsule TAKE 1 CAPSULE BY MOUTH EVERY DAY   finasteride (PROSCAR) 5 MG tablet TAKE 1 TABLET BY MOUTH EVERY DAY   fluticasone (FLONASE) 50 MCG/ACT nasal spray SPRAY 2 SPRAYS INTO EACH NOSTRIL EVERY DAY   furosemide (LASIX) 20 MG tablet TAKE 1 TABLET BY MOUTH EVERY DAY   gabapentin (NEURONTIN) 300 MG capsule TAKE 2 CAPSULES BY MOUTH TWICE A DAY   glucose blood (ONETOUCH VERIO) test strip Check blood sugar once daily   hydrochlorothiazide (HYDRODIURIL) 25 MG tablet TAKE 1 TABLET BY MOUTH EVERY DAY   isosorbide mononitrate (IMDUR) 30 MG 24 hr tablet Take 0.5 tablets (15 mg total) by mouth daily.   ketoconazole (NIZORAL) 2 % cream Apply topically.   losartan (COZAAR) 50 MG tablet TAKE 1 TABLET BY MOUTH EVERY DAY   metFORMIN (GLUCOPHAGE) 500 MG tablet TAKE 1 TABLET BY MOUTH 2 TIMES DAILY WITH A MEAL.   mometasone (ELOCON) 0.1 % lotion Apply topically.   OneTouch Delica Lancets 97C MISC Check blood sugar once daily   polyethylene glycol powder (GLYCOLAX/MIRALAX) 17 GM/SCOOP powder TAKE 8.5-17 G BY MOUTH DAILY AS NEEDED FOR MODERATE CONSTIPATION.   ticagrelor (BRILINTA) 90 MG TABS tablet  Take 1 tablet (90 mg total) by mouth 2 (two) times daily.   traZODone (DESYREL) 50 MG tablet TAKE 1 tablet BY MOUTH AT BEDTIME AS NEEDED FOR SLEEP   No facility-administered encounter medications on file as of 08/16/2021.    Allergies (verified) Morphine sulfate, Nitroglycerin, and Contrast media [iodinated contrast media]   History: Past Medical History:  Diagnosis Date   CAD (coronary artery disease)    a. 1995 s/p CABG x 4 (LIMA->LAD, VG->OM1->OM2, VG->RCA); b. 07/2009 PCI: LM 60, LAD 100ost, LCX 100p, RCA 100, LIMA->LAD atretic, VG->OM1->OM2 patent w/ collats to dLAD, VG->RCA 95 (3.5x15 Vision BMS). EF 55%; c. 02/2021 MV: EF 30% (50-55% by echo), large  inferolateral scar w/ HK. No ischemia.   Chronic rhinitis    ENT [facility]   COVID-19 virus infection 09/2019   Diabetes mellitus type II    Diastolic dysfunction    a. 03/2021 Echo: EF 50-55%, no rwma, GrI DD. RVSP 35.39mmHg. Mild MR.   Diverticulosis 06/2011   by CT and colonoscopy   Fatty liver 06/2011   by CT   GERD (gastroesophageal reflux disease)    History of BPH    History of shingles    HLD (hyperlipidemia)    HTN (hypertension)    Ischemic heart disease    Melanoma ([place]) 12/2015   L forearm, scalp   MI (myocardial infarction) ([place]) 1995   Shingles rash 09/26/2012   Sialadenitis 05/01/2017   Past Surgical History:  Procedure Laterality Date   ANKLE SURGERY  03/2002   Left ankle fracture   CARDIAC CATHETERIZATION     CHOLECYSTECTOMY  1985   COLONOSCOPY     diverticulosis, tortuous colon with looping ([place])   CORONARY ARTERY BYPASS GRAFT  05/1993   x6 MI [place] angioplasty   CORONARY STENT PLACEMENT  07/25/2009   Multi-Link VISION Cobalt chromium    CYSTOSCOPY  10/03/08   Normal-Dr. Bernardo Heater   INGUINAL HERNIA REPAIR  11/2000   and ventral hernia repair   Family History  Problem Relation Age of Onset   Kidney failure Mother    Coronary artery disease Mother    Heart failure Mother    Hypertension Brother    Heart attack Brother    Alcohol abuse Brother    Hypertension Brother    Hypertension Sister    Social History   Socioeconomic History   Marital status: Married    Spouse name: Not on file   Number of children: Not on file   Years of education: Not on file   Highest education level: Not on file  Occupational History   Occupation: Theme park manager  Tobacco Use   Smoking status: Never   Smokeless tobacco: Never  Vaping Use   Vaping Use: Never used  Substance and Sexual Activity   Alcohol use: No    Alcohol/week: 0.0 standard drinks of alcohol   Drug use: No   Sexual activity: Not on file  Other Topics Concern   Not on file  Social History  Narrative   Caffeine: 1 cup/day, 2 cups tea daily   Lives with [name] (wife) and 3 dogs   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[place], close [employer] family   Activity: no regular exercise, does walk some   Diet: good water, fruits/vegetables daily   Social Determinants of Health   Financial Resource Strain: Low Risk  (08/15/2020)   Overall Financial Resource Strain (CARDIA)    Difficulty of Paying Living Expenses: Not hard at all  Food Insecurity: No Food Insecurity (08/15/2020)  Hunger Vital Sign    Worried About Running Out of Food in the Last Year: Never true    Ran Out of Food in the Last Year: Never true  Transportation Needs: No Transportation Needs (08/15/2020)   PRAPARE - Administrator, Civil Service (Medical): No    Lack of Transportation (Non-Medical): No  Physical Activity: Inactive (08/15/2020)   Exercise Vital Sign    Days of Exercise per Week: 0 days    Minutes of Exercise per Session: 0 min  Stress: No Stress Concern Present (08/15/2020)   [employer] of Occupational Health - Occupational Stress Questionnaire    Feeling of Stress : Not at all  Social Connections: Not on file    Tobacco Counseling Counseling given: Not Answered   Clinical Intake:                 Diabetic?  Yes  Nutrition Risk Assessment:  Has the patient had any N/V/D within the last 2 months?  No  Does the patient have any non-healing wounds?  No  Has the patient had any unintentional weight loss or weight gain?  No   Diabetes:  Is the patient diabetic?  Yes  If diabetic, was a CBG obtained today?  No  Did the patient bring in their glucometer from home?  No  How often do you monitor your CBG's? Does not check often.   Financial Strains and Diabetes Management:  Are you having any financial strains with the device, your supplies or your medication? No .  Does the patient want to be seen by Chronic Care Management for management of their diabetes?  No  Would the patient  like to be referred to a Nutritionist or for Diabetic Management?  No   Diabetic Exams:  Diabetic Eye Exam: Pt has been advised about the importance in completing this exam.  Diabetic Foot Exam: Pt has been advised about the importance in completing this exam.         Activities of Daily Living    06/28/2021   10:26 AM 06/12/2021   10:00 AM  In your present state of health, do you have any difficulty performing the following activities:  Hearing? 0 0  Vision? 0 0  Difficulty concentrating or making decisions? 0 0  Walking or climbing stairs? 1 0  Dressing or bathing? 0 0  Doing errands, shopping? 0 0    Patient Care Team: Eustaquio Boyden, MD as PCP - General (Family Medicine) Mariah Milling Tollie Pizza, MD as PCP - Cardiology (Cardiology) Candice Camp, MD as Consulting Physician (Ophthalmology) Mariah Milling Tollie Pizza, MD as Consulting Physician (Cardiology) Phil Dopp, [facility] as Pharmacist (Pharmacist)  Indicate any recent Medical Services you may have received from other than [facility] providers in the past year (date may be approximate).     Assessment:   This is a routine wellness examination for [place].  Hearing/Vision screen No results found.  Dietary issues and exercise activities discussed:     Goals Addressed   None    Depression Screen    08/15/2020    9:54 AM 02/23/2020    9:03 AM 08/13/2019    9:47 AM 08/07/2018    9:40 AM 03/04/2017    9:58 AM 12/26/2015    2:12 PM 12/14/2014   11:16 AM  PHQ 2/9 Scores  PHQ - 2 Score 0 0 0 0 0 0 0  PHQ- 9 Score 0  0 0 0      Fall Risk  08/15/2020    9:54 AM 02/23/2020    9:03 AM 08/13/2019    9:47 AM 08/07/2018    9:40 AM 03/04/2017    9:58 AM  Fall Risk   Falls in the past year? 1 0 0 0 Yes  Comment     fell off a chair and hit back of head; no medical treatment  Number falls in past yr: 0 0 0  1  Injury with Fall? 1 0 0  Yes  Comment hit his head      Risk for fall due to : Medication side effect  Medication side  effect    Follow up Falls evaluation completed;Falls prevention discussed Falls evaluation completed Falls evaluation completed;Falls prevention discussed      FALL RISK PREVENTION PERTAINING TO THE HOME:  Any stairs in or around the home? No  If so, are there any without handrails? No  Home free of loose throw rugs in walkways, pet beds, electrical cords, etc? Yes  Adequate lighting in your home to reduce risk of falls? Yes   ASSISTIVE DEVICES UTILIZED TO PREVENT FALLS:  Life alert? No  Use of a cane, walker or w/c? No  Grab bars in the bathroom? Yes  Shower chair or bench in shower? No  Elevated toilet seat or a handicapped toilet? No   TIMED UP AND GO:  Was the test performed? No .    Cognitive Function:    08/15/2020    9:57 AM 08/13/2019    9:49 AM 08/07/2018    9:45 AM 03/04/2017    9:59 AM 12/26/2015    2:13 PM  MMSE - Mini Mental State Exam  Orientation to time $Remov'5 5 5 5 5  'mwVraw$ Orientation to Place $Remove'5 5 5 5 5  'PqwvEVK$ Registration $Remov'3 3 3 3 3  'HHuWwf$ Attention/ Calculation 5 4 0 0 0  Recall $Remov'3 3 3 3 3  'ltSloh$ Language- name 2 objects   0 0 0  Language- repeat $RemoveBeforeDE'1 1 1 1 1  'UPiZynVdrhLVMdX$ Language- follow 3 step command   0 3 3  Language- read & follow direction   0 0 0  Write a sentence   0 0 0  Copy design   0 0 0  Total score   '17 20 20        '$ Immunizations Immunization History  Administered Date(s) Administered   Influenza Split 11/21/2011   Influenza Whole 11/19/2003, 03/01/2009   Influenza, High Dose Seasonal PF 10/03/2016, 10/27/2017, 12/03/2018   Influenza,inj,Quad PF,6+ Mos 11/23/2012, 02/15/2014, 12/14/2014, 12/21/2015   PFIZER(Purple Top)SARS-COV-2 Vaccination 03/09/2019, 04/03/2019, 02/23/2020   Pneumococcal Conjugate-13 08/09/2013   Pneumococcal Polysaccharide-23 05/10/2004, 08/22/2020   Td 12/26/1997, 04/12/2009   Zoster, Live 11/15/2010    TDAP status: Due, Education has been provided regarding the importance of this vaccine. Advised may receive this vaccine at local pharmacy or Health  Dept. Aware to provide a copy of the vaccination record if obtained from local pharmacy or Health Dept. Verbalized acceptance and understanding.  Flu Vaccine status: Due, Education has been provided regarding the importance of this vaccine. Advised may receive this vaccine at local pharmacy or Health Dept. Aware to provide a copy of the vaccination record if obtained from local pharmacy or Health Dept. Verbalized acceptance and understanding.  Pneumococcal vaccine status: Up to date  Covid-19 vaccine status: Information provided on how to obtain vaccines.   Qualifies for Shingles Vaccine? Yes   Zostavax completed Yes   Shingrix Completed?: No.    Education has been  provided regarding the importance of this vaccine. Patient has been advised to call insurance company to determine out of pocket expense if they have not yet received this vaccine. Advised may also receive vaccine at local pharmacy or Health Dept. Verbalized acceptance and understanding.  Screening Tests Health Maintenance  Topic Date Due   Zoster Vaccines- Shingrix (1 of 2) Never done   COVID-19 Vaccine (4 - Pfizer series) 04/19/2020   OPHTHALMOLOGY EXAM  12/14/2020   TETANUS/TDAP  08/13/2023 (Originally 04/13/2019)   INFLUENZA VACCINE  09/18/2021   HEMOGLOBIN A1C  12/12/2021   FOOT EXAM  02/23/2022   Pneumonia Vaccine 5+ Years old  Completed   HPV VACCINES  Aged Out    Health Maintenance  Health Maintenance Due  Topic Date Due   Zoster Vaccines- Shingrix (1 of 2) Never done   COVID-19 Vaccine (4 - Pfizer series) 04/19/2020   OPHTHALMOLOGY EXAM  12/14/2020    Colorectal cancer screening: No longer required.   Lung Cancer Screening: (Low Dose CT Chest recommended if Age 45-80 years, 30 pack-year currently smoking OR have quit w/in 15years.) does not qualify.   Lung Cancer Screening Referral:   Additional Screening:  Hepatitis C Screening:  does not qualify  Vision Screening: Recommended annual ophthalmology  exams for early detection of glaucoma and other disorders of the eye. Is the patient up to date with their annual eye exam?  Yes  Who is the provider or what is the name of the office in which the patient attends annual eye exams? [facility] If pt is not established with a provider, would they like to be referred to a provider to establish care? No .   Dental Screening: Recommended annual dental exams for proper oral hygiene  Community Resource Referral / Chronic Care Management: CRR required this visit?  No   CCM required this visit?  No      Plan:     I have personally reviewed and noted the following in the patient's chart:   Medical and social history Use of alcohol, tobacco or illicit drugs  Current medications and supplements including opioid prescriptions. Patient is not currently taking opioid prescriptions. Functional ability and status Nutritional status Physical activity Advanced directives List of other physicians Hospitalizations, surgeries, and ER visits in previous 12 months Vitals Screenings to include cognitive, depression, and falls Referrals and appointments  In addition, I have reviewed and discussed with patient certain preventive protocols, quality metrics, and best practice recommendations. A written personalized care plan for preventive services as well as general preventive health recommendations were provided to patient.     Leroy Kennedy, LPN   1/61/0960   Nurse Notes:

## 2021-08-16 NOTE — Patient Instructions (Signed)
Aaron Stafford , Thank you for taking time to come for your Medicare Wellness Visit. I appreciate your ongoing commitment to your health goals. Please review the following plan we discussed and let me know if I can assist you in the future.   Screening recommendations/referral Recommended yearly ophthalmology/optometry visit for glaucoma screening and checkup Recommended yearly dental visit for hygiene and checkup  Vaccinations: Influenza vaccine: Education provided Pneumococcal vaccine: Education provided Tdap vaccine: Education provided Shingles vaccine: Education provided      Next appointment: 08-29-2021 @ 9:30 [facility] 65 Years and Older, Male Preventive care refers to lifestyle choices and visits with your health care provider that can promote health and wellness. What does preventive care include? A yearly physical exam. This is also called an annual well check. Dental exams once or twice a year. Routine eye exams. Ask your health care provider how often you should have your eyes checked. Personal lifestyle choices, including: Daily care of your teeth and gums. Regular physical activity. Eating a healthy diet. Avoiding tobacco and drug use. Limiting alcohol use. Practicing safe sex. Taking low doses of aspirin every day. Taking vitamin and mineral supplements as recommended by your health care provider. What happens during an annual well check? The services and screenings done by your health care provider during your annual well check will depend on your age, overall health, lifestyle risk factors, and family history of disease. Counseling  Your health care provider may ask you questions about your: Alcohol use. Tobacco use. Drug use. Emotional well-being. Home and relationship well-being. Sexual activity. Eating habits. History of falls. Memory and ability to understand (cognition). Work and work Statistician. Screening  You may have the following  tests or measurements: Height, weight, and BMI. Blood pressure. Lipid and cholesterol levels. These may be checked every 5 years, or more frequently if you are over 7 years old. Skin check. Lung cancer screening. You may have this screening every year starting at age 44 if you have a 30-pack-year history of smoking and currently smoke or have quit within the past 15 years. Fecal occult blood test (FOBT) of the stool. You may have this test every year starting at age 72. Flexible sigmoidoscopy or colonoscopy. You may have a sigmoidoscopy every 5 years or a colonoscopy every 10 years starting at age 73. Prostate cancer screening. Recommendations will vary depending on your family history and other risks. Hepatitis C blood test. Hepatitis B blood test. Sexually transmitted disease (STD) testing. Diabetes screening. This is done by checking your blood sugar (glucose) after you have not eaten for a while (fasting). You may have this done every 1-3 years. Abdominal aortic aneurysm (AAA) screening. You may need this if you are a current or former smoker. Osteoporosis. You may be screened starting at age 51 if you are at high risk. Talk with your health care provider about your test results, treatment options, and if necessary, the need for more tests. Vaccines  Your health care provider may recommend certain vaccines, such as: Influenza vaccine. This is recommended every year. Tetanus, diphtheria, and acellular pertussis (Tdap, Td) vaccine. You may need a Td booster every 10 years. Zoster vaccine. You may need this after age 78. Pneumococcal 13-valent conjugate (PCV13) vaccine. One dose is recommended after age 52. Pneumococcal polysaccharide (PPSV23) vaccine. One dose is recommended after age 84. Talk to your health care provider about which screenings and vaccines you need and how often you need them. This information is not intended to  replace advice given to you by your health care provider.  Make sure you discuss any questions you have with your health care provider. Document Released: 03/03/2015 Document Revised: 10/25/2015 Document Reviewed: 12/06/2014 Elsevier Interactive Patient Education  2017 [employer] Prevention in the Home Falls can cause injuries. They can happen to people of all ages. There are many things you can do to make your home safe and to help prevent falls. What can I do on the outside of my home? Regularly fix the edges of walkways and driveways and fix any cracks. Remove anything that might make you trip as you walk through a door, such as a raised step or threshold. Trim any bushes or trees on the path to your home. Use bright outdoor lighting. Clear any walking paths of anything that might make someone trip, such as rocks or tools. Regularly check to see if handrails are loose or broken. Make sure that both sides of any steps have handrails. Any raised decks and porches should have guardrails on the edges. Have any leaves, snow, or ice cleared regularly. Use sand or salt on walking paths during winter. Clean up any spills in your garage right away. This includes oil or grease spills. What can I do in the bathroom? Use night lights. Install grab bars by the toilet and in the tub and shower. Do not use towel bars as grab bars. Use non-skid mats or decals in the tub or shower. If you need to sit down in the shower, use a plastic, non-slip stool. Keep the floor dry. Clean up any water that spills on the floor as soon as it happens. Remove soap buildup in the tub or shower regularly. Attach bath mats securely with double-sided non-slip rug tape. Do not have throw rugs and other things on the floor that can make you trip. What can I do in the bedroom? Use night lights. Make sure that you have a light by your bed that is easy to reach. Do not use any sheets or blankets that are too big for your bed. They should not hang down onto the floor. Have a  firm chair that has side arms. You can use this for support while you get dressed. Do not have throw rugs and other things on the floor that can make you trip. What can I do in the kitchen? Clean up any spills right away. Avoid walking on wet floors. Keep items that you use a lot in easy-to-reach places. If you need to reach something above you, use a strong step stool that has a grab bar. Keep electrical cords out of the way. Do not use floor polish or wax that makes floors slippery. If you must use wax, use non-skid floor wax. Do not have throw rugs and other things on the floor that can make you trip. What can I do with my stairs? Do not leave any items on the stairs. Make sure that there are handrails on both sides of the stairs and use them. Fix handrails that are broken or loose. Make sure that handrails are as long as the stairways. Check any carpeting to make sure that it is firmly attached to the stairs. Fix any carpet that is loose or worn. Avoid having throw rugs at the top or bottom of the stairs. If you do have throw rugs, attach them to the floor with carpet tape. Make sure that you have a light switch at the top of the stairs and the  bottom of the stairs. If you do not have them, ask someone to add them for you. What else can I do to help prevent falls? Wear shoes that: Do not have high heels. Have rubber bottoms. Are comfortable and fit you well. Are closed at the toe. Do not wear sandals. If you use a stepladder: Make sure that it is fully opened. Do not climb a closed stepladder. Make sure that both sides of the stepladder are locked into place. Ask someone to hold it for you, if possible. Clearly mark and make sure that you can see: Any grab bars or handrails. First and last steps. Where the edge of each step is. Use tools that help you move around (mobility aids) if they are needed. These include: Canes. Walkers. Scooters. Crutches. Turn on the lights when you  go into a dark area. Replace any light bulbs as soon as they burn out. Set up your furniture so you have a clear path. Avoid moving your furniture around. If any of your floors are uneven, fix them. If there are any pets around you, be aware of where they are. Review your medicines with your doctor. Some medicines can make you feel dizzy. This can increase your chance of falling. Ask your doctor what other things that you can do to help prevent falls. This information is not intended to replace advice given to you by your health care provider. Make sure you discuss any questions you have with your health care provider. Document Released: 12/01/2008 Document Revised: 07/13/2015 Document Reviewed: 03/11/2014 Elsevier Interactive Patient Education  2017 [employer].

## 2021-08-29 ENCOUNTER — Encounter: Payer: Self-pay | Admitting: Family Medicine

## 2021-08-29 ENCOUNTER — Ambulatory Visit (INDEPENDENT_AMBULATORY_CARE_PROVIDER_SITE_OTHER)
Admission: RE | Admit: 2021-08-29 | Discharge: 2021-08-29 | Disposition: A | Discharge status: Home / Self Care | Payer: Medicare Other | Source: Ambulatory Visit | Attending: Family Medicine | Admitting: Family Medicine

## 2021-08-29 ENCOUNTER — Ambulatory Visit (INDEPENDENT_AMBULATORY_CARE_PROVIDER_SITE_OTHER): Payer: Medicare Other | Admitting: Family Medicine

## 2021-08-29 VITALS — BP 118/62 | HR 58 | Temp 97.7°F | Ht 66.5 in | Wt 180.0 lb

## 2021-08-29 DIAGNOSIS — E785 Hyperlipidemia, unspecified: Secondary | ICD-10-CM

## 2021-08-29 DIAGNOSIS — I10 Essential (primary) hypertension: Secondary | ICD-10-CM

## 2021-08-29 DIAGNOSIS — I2510 Atherosclerotic heart disease of native coronary artery without angina pectoris: Secondary | ICD-10-CM

## 2021-08-29 DIAGNOSIS — Z7189 Other specified counseling: Secondary | ICD-10-CM

## 2021-08-29 DIAGNOSIS — M89319 Hypertrophy of bone, unspecified shoulder: Secondary | ICD-10-CM | POA: Diagnosis not present

## 2021-08-29 DIAGNOSIS — N401 Enlarged prostate with lower urinary tract symptoms: Secondary | ICD-10-CM | POA: Diagnosis not present

## 2021-08-29 DIAGNOSIS — E559 Vitamin D deficiency, unspecified: Secondary | ICD-10-CM

## 2021-08-29 DIAGNOSIS — Z951 Presence of aortocoronary bypass graft: Secondary | ICD-10-CM

## 2021-08-29 DIAGNOSIS — E1169 Type 2 diabetes mellitus with other specified complication: Secondary | ICD-10-CM

## 2021-08-29 DIAGNOSIS — N138 Other obstructive and reflux uropathy: Secondary | ICD-10-CM

## 2021-08-29 DIAGNOSIS — E663 Overweight: Secondary | ICD-10-CM

## 2021-08-29 DIAGNOSIS — I5032 Chronic diastolic (congestive) heart failure: Secondary | ICD-10-CM

## 2021-08-29 DIAGNOSIS — M25711 Osteophyte, right shoulder: Secondary | ICD-10-CM | POA: Diagnosis not present

## 2021-08-29 DIAGNOSIS — E1140 Type 2 diabetes mellitus with diabetic neuropathy, unspecified: Secondary | ICD-10-CM

## 2021-08-29 DIAGNOSIS — K5909 Other constipation: Secondary | ICD-10-CM

## 2021-08-29 DIAGNOSIS — E1142 Type 2 diabetes mellitus with diabetic polyneuropathy: Secondary | ICD-10-CM | POA: Diagnosis not present

## 2021-08-29 DIAGNOSIS — M19011 Primary osteoarthritis, right shoulder: Secondary | ICD-10-CM | POA: Diagnosis not present

## 2021-08-29 DIAGNOSIS — I200 Unstable angina: Secondary | ICD-10-CM

## 2021-08-29 DIAGNOSIS — N3941 Urge incontinence: Secondary | ICD-10-CM

## 2021-08-29 DIAGNOSIS — G4700 Insomnia, unspecified: Secondary | ICD-10-CM

## 2021-08-29 DIAGNOSIS — I639 Cerebral infarction, unspecified: Secondary | ICD-10-CM | POA: Diagnosis not present

## 2021-08-29 DIAGNOSIS — M25511 Pain in right shoulder: Secondary | ICD-10-CM | POA: Diagnosis not present

## 2021-08-29 LAB — LIPID PANEL
Cholesterol: 116 mg/dL (ref 0–200)
HDL: 35.6 mg/dL — ABNORMAL LOW (ref >39.00)
LDL Cholesterol: 49 mg/dL (ref 0–99)
NonHDL: 80.25
Total CHOL/HDL Ratio: 3
Triglycerides: 156 mg/dL — ABNORMAL HIGH (ref 0.0–149.0)
VLDL: 31.2 mg/dL (ref 0.0–40.0)

## 2021-08-29 LAB — COMPREHENSIVE METABOLIC PANEL
ALT: 18 U/L (ref 0–53)
AST: 18 U/L (ref 0–37)
Albumin: 4.2 g/dL (ref 3.5–5.2)
Alkaline Phosphatase: 70 U/L (ref 39–117)
BUN: 26 mg/dL — ABNORMAL HIGH (ref 6–23)
CO2: 31 mEq/L (ref 19–32)
Calcium: 9.6 mg/dL (ref 8.4–10.5)
Chloride: 98 mEq/L (ref 96–112)
Creatinine, Ser: 1.12 mg/dL (ref 0.40–1.50)
GFR: 59.9 mL/min — ABNORMAL LOW (ref >60.00)
Glucose, Bld: 135 mg/dL — ABNORMAL HIGH (ref 70–99)
Potassium: 4.1 mEq/L (ref 3.5–5.1)
Sodium: 136 mEq/L (ref 135–145)
Total Bilirubin: 0.9 mg/dL (ref 0.2–1.2)
Total Protein: 6.7 g/dL (ref 6.0–8.3)

## 2021-08-29 LAB — VITAMIN D 25 HYDROXY (VIT D DEFICIENCY, FRACTURES): VITD: 37.11 ng/mL (ref 30.00–100.00)

## 2021-08-29 NOTE — Assessment & Plan Note (Signed)
Advanced planning - scanned into chart 06/2014. HCPOA is wife Aaron Stafford then granddaughter [name]. Ok with tube feeds. Does not want prolonged life support.
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
## 2021-08-29 NOTE — Patient Instructions (Addendum)
Labs today  Clavicle xray today.  If interested, check with pharmacy about new 2 shot shingles series (shingrix).  Next visit we may try mirbetriq for bladder symptoms.  Good to see you today Return as needed or in 6 months for follow up visit.   Health Maintenance After Age 85 After age 48, you are at a higher risk for certain long-term diseases and infections as well as injuries from falls. Falls are a major cause of broken bones and head injuries in people who are older than age 39. Getting regular preventive care can help to keep you healthy and well. Preventive care includes getting regular testing and making lifestyle changes as recommended by your health care provider. Talk with your health care provider about: Which screenings and tests you should have. A screening is a test that checks for a disease when you have no symptoms. A diet and exercise plan that is right for you. What should I know about screenings and tests to prevent falls? Screening and testing are the best ways to find a health problem early. Early diagnosis and treatment give you the best chance of managing medical conditions that are common after age 70. Certain conditions and lifestyle choices may make you more likely to have a fall. Your health care provider may recommend: Regular vision checks. Poor vision and conditions such as cataracts can make you more likely to have a fall. If you wear glasses, make sure to get your prescription updated if your vision changes. Medicine review. Work with your health care provider to regularly review all of the medicines you are taking, including over-the-counter medicines. Ask your health care provider about any side effects that may make you more likely to have a fall. Tell your health care provider if any medicines that you take make you feel dizzy or sleepy. Strength and balance checks. Your health care provider may recommend certain tests to check your strength and balance while  standing, walking, or changing positions. Foot health exam. Foot pain and numbness, as well as not wearing proper footwear, can make you more likely to have a fall. Screenings, including: Osteoporosis screening. Osteoporosis is a condition that causes the bones to get weaker and break more easily. Blood pressure screening. Blood pressure changes and medicines to control blood pressure can make you feel dizzy. Depression screening. You may be more likely to have a fall if you have a fear of falling, feel depressed, or feel unable to do activities that you used to do. Alcohol use screening. Using too much alcohol can affect your balance and may make you more likely to have a fall. Follow these instructions at home: Lifestyle Do not drink alcohol if: Your health care provider tells you not to drink. If you drink alcohol: Limit how much you have to: 0-1 drink a day for women. 0-2 drinks a day for men. Know how much alcohol is in your drink. In the [place] one drink equals one 12 oz bottle of beer (355 mL), one 5 oz glass of wine (148 mL), or one 1 oz glass of hard liquor (44 mL). Do not use any products that contain nicotine or tobacco. These products include cigarettes, chewing tobacco, and vaping devices, such as e-cigarettes. If you need help quitting, ask your health care provider. Activity  Follow a regular exercise program to stay fit. This will help you maintain your balance. Ask your health care provider what types of exercise are appropriate for you. If you need a cane  or walker, use it as recommended by your health care provider. Wear supportive shoes that have nonskid soles. Safety  Remove any tripping hazards, such as rugs, cords, and clutter. Install safety equipment such as grab bars in bathrooms and safety rails on stairs. Keep rooms and walkways well-lit. General instructions Talk with your health care provider about your risks for falling. Tell your health care provider  if: You fall. Be sure to tell your health care provider about all falls, even ones that seem minor. You feel dizzy, tiredness (fatigue), or off-balance. Take over-the-counter and prescription medicines only as told by your health care provider. These include supplements. Eat a healthy diet and maintain a healthy weight. A healthy diet includes low-fat dairy products, low-fat (lean) meats, and fiber from whole grains, beans, and lots of fruits and vegetables. Stay current with your vaccines. Schedule regular health, dental, and eye exams. Summary Having a healthy lifestyle and getting preventive care can help to protect your health and wellness after age 33. Screening and testing are the best way to find a health problem early and help you avoid having a fall. Early diagnosis and treatment give you the best chance for managing medical conditions that are more common for people who are older than age 72. Falls are a major cause of broken bones and head injuries in people who are older than age 59. Take precautions to prevent a fall at home. Work with your health care provider to learn what changes you can make to improve your health and wellness and to prevent falls. This information is not intended to replace advice given to you by your health care provider. Make sure you discuss any questions you have with your health care provider. Document Revised: 06/26/2020 Document Reviewed: 06/26/2020 Elsevier Patient Education  [place].

## 2021-08-30 MED ORDER — GABAPENTIN 300 MG PO CAPS: 600.0000 mg | ORAL_CAPSULE | Freq: Two times a day (BID) | ORAL | 3 refills | Status: DC

## 2021-08-30 MED ORDER — HYDROCHLOROTHIAZIDE 25 MG PO TABS: 25.0000 mg | ORAL_TABLET | Freq: Every day | ORAL | 3 refills | Status: DC

## 2021-08-30 MED ORDER — METFORMIN HCL 500 MG PO TABS
500.0000 mg | ORAL_TABLET | Freq: Two times a day (BID) | ORAL | 3 refills | Status: DC
Start: 2021-08-30 — End: 2022-09-03

## 2021-08-30 MED ORDER — FINASTERIDE 5 MG PO TABS: 5.0000 mg | ORAL_TABLET | Freq: Every day | ORAL | 3 refills | Status: DC

## 2021-08-30 MED ORDER — ATORVASTATIN CALCIUM 40 MG PO TABS
40.0000 mg | ORAL_TABLET | Freq: Every day | ORAL | 3 refills | Status: DC
Start: 2021-08-30 — End: 2022-08-23

## 2021-08-30 MED ORDER — DULOXETINE HCL 30 MG PO CPEP: ORAL_CAPSULE | ORAL | 3 refills | Status: DC

## 2021-08-30 MED ORDER — LOSARTAN POTASSIUM 50 MG PO TABS
50.0000 mg | ORAL_TABLET | Freq: Every day | ORAL | 3 refills | Status: DC
Start: 2021-08-30 — End: 2022-09-03

## 2021-08-30 MED ORDER — FUROSEMIDE 20 MG PO TABS
20.0000 mg | ORAL_TABLET | Freq: Every day | ORAL | 3 refills | Status: DC
Start: 2021-08-30 — End: 2022-09-03

## 2021-08-30 NOTE — Assessment & Plan Note (Addendum)
Continues aspirin, statin. New brilinta planned x 3 months

## 2021-08-30 NOTE — Assessment & Plan Note (Signed)
Weight gain noted. Continue to monitor.

## 2021-08-30 NOTE — Assessment & Plan Note (Signed)
Stable period based on latest A1c <62moago - continue metformin '500mg'$  BID.  Consider GLP1RA, SGLT2i for cardiovascular protection.

## 2021-08-30 NOTE — Assessment & Plan Note (Signed)
Continues losartan '50mg'$  daily, hctz '25mg'$  nightly, with lasix daily.

## 2021-08-30 NOTE — Assessment & Plan Note (Signed)
Continue gabapentin 600mg bid 

## 2021-08-30 NOTE — Assessment & Plan Note (Signed)
Unsure how long present tender enlarged head of R clavicle - update clavicle xray.

## 2021-08-30 NOTE — Assessment & Plan Note (Signed)
Describes urge incontinence. Consider myrbetriq 32moafter stroke (given possible HTN side effect). Avoid antimuscarinics given age.

## 2021-08-30 NOTE — Assessment & Plan Note (Signed)
Stable period on colace and metamucil.

## 2021-08-30 NOTE — Assessment & Plan Note (Addendum)
Now on brilinta + aspirin x 3 months then just aspirin.  Appreciate neurology care.  Consider plavix use after this - will test for plavix metabolism/nonresponder given recent recurrent stroke while on plavix.

## 2021-08-30 NOTE — Assessment & Plan Note (Signed)
Update levels off replacement. 

## 2021-08-30 NOTE — Assessment & Plan Note (Addendum)
Back on losartan, furosemide '20mg'$  daily.  Seems euvolemic

## 2021-08-30 NOTE — Assessment & Plan Note (Addendum)
Continue atorvastatin - recent increase to '40mg'$  daily.  The ASCVD Risk score (Arnett DK, et al., 2019) failed to calculate for the following reasons:   The 2019 ASCVD risk score is only valid for ages 29 to 47   The patient has a prior MI or stroke diagnosis

## 2021-08-30 NOTE — Assessment & Plan Note (Addendum)
Chronic, stable period on finasteride.

## 2021-08-30 NOTE — Assessment & Plan Note (Addendum)
On trazodone nightly PRN.

## 2021-09-02 LAB — FRUCTOSAMINE: Fructosamine: 283 umol/L (ref 205–285)

## 2021-09-06 LAB — ACCUTYPE® CP, CLOPIDOGREL CYP2C19 GENOTYPE

## 2021-09-13 ENCOUNTER — Encounter: Payer: Self-pay | Admitting: Family Medicine

## 2021-09-13 DIAGNOSIS — E8889 Other specified metabolic disorders: Secondary | ICD-10-CM | POA: Insufficient documentation

## 2021-09-14 ENCOUNTER — Encounter: Payer: Self-pay | Admitting: Family Medicine

## 2021-09-17 NOTE — Telephone Encounter (Signed)
Spoke with pt notifying him wife's Aaron Stafford- he's on her dpr) rx was sent in on 09/14/21.  States they have picked it up.  [This message is also in pt's wife's, Aaron Stafford- 01/26/1939, chart 09/14/21 phn note.]

## 2021-10-16 ENCOUNTER — Other Ambulatory Visit: Payer: Self-pay | Admitting: Family Medicine

## 2021-10-16 NOTE — Telephone Encounter (Signed)
Prescribed by [facility].  Med not on current list.   Spoke with pt asking if he still takes Brilinta.  Confirms he does but needs refill.  Brilinta Last filled:  09/12/21, #60 Last OV:  08/29/21, CPE Next OV:  03/04/22, 6 mo f/u

## 2021-10-17 ENCOUNTER — Telehealth: Payer: Self-pay | Admitting: *Deleted

## 2021-10-17 NOTE — Telephone Encounter (Addendum)
Patient notified as instructed by telephone and verbalized understanding. Patient stated that he has been taking Pepto because he started with gas and loose stools Saturday.Patient stated that he has had a lot of gas and his stomach has been rumbling. Patient denies dizziness/lightheadedness or SOB. Patient stated that he will stop the Pepto. Patient wants to know if there is something else he can take that will help with all of the gas that he is having. Patient was given ER precautions and he verbalized understanding.

## 2021-10-17 NOTE — Telephone Encounter (Signed)
See advice given to patient by Access Nurse.

## 2021-10-17 NOTE — Telephone Encounter (Signed)
Patient notified as instructed by telephone and verbalized understanding. 

## 2021-10-17 NOTE — Telephone Encounter (Signed)
PLEASE NOTE: All timestamps contained within this report are represented as [place] Standard Time. CONFIDENTIALTY NOTICE: This fax transmission is intended only for the addressee. It contains information that is legally privileged, confidential or otherwise protected from use or disclosure. If you are not the intended recipient, you are strictly prohibited from reviewing, disclosing, copying using or disseminating any of this information or taking any action in reliance on or regarding this information. If you have received this fax in error, please notify us immediately by telephone so that we can arrange for its return to [place]. Phone: [phone], Toll-Free: [phone], Fax: [phone] Page: 1 of 2 Call Id: [id number] [place] [facility] - Client TELEPHONE ADVICE RECORD [facility] Patient Name: Aaron Stafford Gender: Male DOB: 05/07/1934 Age: 87 Y 48 M 27 D Return Phone Number: [phone] (Primary) Address: City/ State/ Zip: [place] [address] Client [place] [facility] - Client Client Site [place] Provider Ria Bush - MD Contact Type Call Who Is Calling Patient / Member / Family / Caregiver Call Type Triage / Clinical Relationship To Patient Self Return Phone Number [phone] (Primary) Chief Complaint Stool - Unusual Color Reason for Call Symptomatic / Request for [place] states he is having back stools. Translation No Nurse Assessment Nurse: Radford Pax, RN, Eugene Garnet Date/Time Eilene Ghazi Time): 10/17/2021 11:00:18 AM Confirm and document reason for call. If symptomatic, describe symptoms. ---May have had black stools. Has been taking peptobismol since monday. Does the patient have any new or worsening symptoms? ---Yes Will a triage be completed? ---Yes Related visit to physician within the last 2 weeks? ---No Does the PT have any chronic conditions?  (i.e. diabetes, asthma, this includes High risk factors for pregnancy, etc.) ---Yes List chronic conditions. ---type 2 diabetes, hx stroke Is this a behavioral health or substance abuse call? ---No Guidelines Guideline Title Affirmed Question Affirmed Notes Nurse Date/Time (Eastern Time) Stools - Unusual Color Unusual stool color probably from food or medicine Turner, RN, Eugene Garnet 10/17/2021 11:04:53 AM Disp. Time Eilene Ghazi Time) Disposition Final User 10/17/2021 11:08:54 AM Home Care Yes Radford Pax, RN, Eugene Garnet Final Disposition 10/17/2021 11:08:54 AM Home Care Yes Radford Pax, RN, Eugene Garnet PLEASE NOTE: All timestamps contained within this report are represented as [place] Standard Time. CONFIDENTIALTY NOTICE: This fax transmission is intended only for the addressee. It contains information that is legally privileged, confidential or otherwise protected from use or disclosure. If you are not the intended recipient, you are strictly prohibited from reviewing, disclosing, copying using or disseminating any of this information or taking any action in reliance on or regarding this information. If you have received this fax in error, please notify us immediately by telephone so that we can arrange for its return to [place]. Phone: [phone], Toll-Free: [phone], Fax: [phone] Page: 2 of 2 Call Id: [id number] [place] Disagree/Comply Comply Caller Understands Yes PreDisposition Call Doctor Care Advice Given Per Guideline HOME CARE: * You should be able to treat this at home. REASSURANCE AND EDUCATION - UNUSUAL STOOL COLOR: * Unusual colors of the stool can often be caused by certain foods (e.g., spinach, food coloring that has been added (e.g., icing on cake), and certain medicines (e.g., Kaopectate, Pepto-Bismol). CALL BACK IF: * Colors that may be a sign of disease include: red, jetblack, clay-colored, or yellow-white. * Unexplained color lasts over 24 hours * You become worse CARE ADVICE given per  [facility] - Unusual Color (Adult) guideline

## 2021-10-17 NOTE — Telephone Encounter (Signed)
Dark/black stools after pepto bismol.  Plz ensure no other symptoms such as dizziness/lightheadedness or shortness of breath.  Recommend hold pepto bismol and let us know right away if any recurrent dark/black stools off this OTC med.

## 2021-10-17 NOTE — Telephone Encounter (Signed)
Recommend he start with simethicone or GasX OTC.

## 2021-10-27 ENCOUNTER — Other Ambulatory Visit: Payer: Self-pay | Admitting: Family Medicine

## 2021-11-01 DIAGNOSIS — R29818 Other symptoms and signs involving the nervous system: Secondary | ICD-10-CM | POA: Diagnosis not present

## 2021-11-01 DIAGNOSIS — G629 Polyneuropathy, unspecified: Secondary | ICD-10-CM | POA: Diagnosis not present

## 2021-11-01 DIAGNOSIS — L821 Other seborrheic keratosis: Secondary | ICD-10-CM | POA: Diagnosis not present

## 2021-11-01 DIAGNOSIS — Z8673 Personal history of transient ischemic attack (TIA), and cerebral infarction without residual deficits: Secondary | ICD-10-CM | POA: Diagnosis not present

## 2021-11-01 DIAGNOSIS — L814 Other melanin hyperpigmentation: Secondary | ICD-10-CM | POA: Diagnosis not present

## 2021-11-01 DIAGNOSIS — D692 Other nonthrombocytopenic purpura: Secondary | ICD-10-CM | POA: Diagnosis not present

## 2021-11-01 DIAGNOSIS — L570 Actinic keratosis: Secondary | ICD-10-CM | POA: Diagnosis not present

## 2021-12-18 DIAGNOSIS — Z23 Encounter for immunization: Secondary | ICD-10-CM | POA: Diagnosis not present

## 2022-01-15 DIAGNOSIS — R29818 Other symptoms and signs involving the nervous system: Secondary | ICD-10-CM | POA: Diagnosis not present

## 2022-01-15 DIAGNOSIS — Z8673 Personal history of transient ischemic attack (TIA), and cerebral infarction without residual deficits: Secondary | ICD-10-CM | POA: Diagnosis not present

## 2022-01-15 DIAGNOSIS — G250 Essential tremor: Secondary | ICD-10-CM | POA: Diagnosis not present

## 2022-02-01 ENCOUNTER — Ambulatory Visit
Admission: RE | Admit: 2022-02-01 | Discharge: 2022-02-01 | Disposition: A | Discharge status: Home / Self Care | Payer: Medicare Other | Source: Ambulatory Visit | Attending: Family Medicine | Admitting: Family Medicine

## 2022-02-01 VITALS — BP 154/69 | HR 68 | Temp 98.1°F | Resp 15 | Ht 66.4 in | Wt 175.0 lb

## 2022-02-01 DIAGNOSIS — J0100 Acute maxillary sinusitis, unspecified: Secondary | ICD-10-CM | POA: Diagnosis not present

## 2022-02-01 LAB — SARS CORONAVIRUS 2 BY RT PCR: SARS Coronavirus 2 by RT PCR: NEGATIVE

## 2022-02-01 MED ORDER — AMOXICILLIN 500 MG PO TABS: 500.0000 mg | ORAL_TABLET | Freq: Two times a day (BID) | ORAL | 0 refills | Status: AC

## 2022-02-01 NOTE — Discharge Instructions (Addendum)
You test for COVID is negative.  Stop by the pharmacy to pick up your antibiotics.    It is important to stay hydrated: drink plenty of fluids (water, gatorade/powerade/pedialyte, juices, or teas) to keep your throat moisturized and help further relieve irritation/discomfort.    Return or go to the urgent care, if symptoms worsen or do not improve in the next few days

## 2022-02-01 NOTE — ED Provider Notes (Signed)
[facility]    CSN: [id number] Arrival date & time: 02/01/22  1056      History   Chief Complaint Chief Complaint  Patient presents with   Cough   Nasal Congestion    Appointment    HPI LEN AZEEZ is a 87 y.o. male.   HPI   Kyan presents for ongoing cough and nasal congestion and headache that started 2 days ago. Requests a COVID test has his wife had COVID recently.  No known fever, vomiting, shortness of breath, chest discomfort, diarrhea, abdominal pain. Had a bloody nose and neck pain. Reports history of arthritis in his neck.       Past Medical History:  Diagnosis Date   CAD (coronary artery disease)    a. 1995 s/p CABG x 4 (LIMA->LAD, VG->OM1->OM2, VG->RCA); b. 07/2009 PCI: LM 60, LAD 100ost, LCX 100p, RCA 100, LIMA->LAD atretic, VG->OM1->OM2 patent w/ collats to dLAD, VG->RCA 95 (3.5x15 Vision BMS). EF 55%; c. 02/2021 MV: EF 30% (50-55% by echo), large inferolateral scar w/ HK. No ischemia.   Chronic rhinitis    ENT [place]   COVID-19 virus infection 09/2019   Diabetes mellitus type II    Diastolic dysfunction    a. 03/2021 Echo: EF 50-55%, no rwma, GrI DD. RVSP 35.1mHg. Mild MR.   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/2021   Cerebellar stroke, acute , recurrent(HCC) 06/12/2021   Fall    Chronic diastolic CHF (congestive heart failure) ([place]    Clavicle enlargement 02/24/2021   Chest tightness 02/24/2021   New onset headache 09/07/2020   Chronic constipation 08/22/2020   Tremor 08/14/2018   Type 2 diabetes  mellitus with diabetic neuropathy, unspecified ([place] 10/05/2015   Advanced care planning/counseling discussion 06/26/2014   BPH with obstruction/lower urinary tract symptoms    Medicare annual wellness visit, subsequent 03/25/2011   S/P CABG (coronary artery bypass graft) 06/07/2010   HTN (hypertension)    Hyperlipidemia associated with type 2 diabetes mellitus ([facility]    Type 2 diabetes, controlled, with peripheral neuropathy (HCC)    GERD (gastroesophageal reflux disease)    Chronic rhinitis    Vitamin D deficiency 04/11/2008   Insomnia 03/14/2008   Urinary incontinence, urge 07/07/2006   Overweight with body mass index (BMI) 25.0-29.9 07/03/2006   ERECTILE DYSFUNCTION 07/01/2006   Coronary atherosclerosis 07/01/2006   Irritable bowel syndrome 07/01/2006    Past Surgical History:  Procedure Laterality Date   ANKLE SURGERY  03/2002   Left ankle fracture   CARDIAC CATHETERIZATION     CHOLECYSTECTOMY  1985   COLONOSCOPY     diverticulosis, tortuous colon with looping ([name]   CORONARY ARTERY BYPASS GRAFT  05/1993   x6 MI Attemped angioplasty   CORONARY STENT PLACEMENT  07/25/2009   Multi-Link VISION Cobalt chromium    CYSTOSCOPY  10/03/08   Normal-Dr. SBernardo Heater  INGUINAL HERNIA REPAIR  11/2000   and ventral hernia repair       Home Medications  Prior to Admission medications   Medication Sig Start Date End Date Taking? Authorizing Provider  amoxicillin (AMOXIL) 500 MG tablet Take 1 tablet (500 mg total) by mouth 2 (two) times daily for 7 days. 02/01/22 02/08/22 Yes Jyoti Harju, DO  Alpha-Lipoic Acid 600 MG CAPS Take 1 capsule (600 mg total) by mouth 2 (two) times daily. 06/26/16   Ria Bush, MD  aspirin 81 MG tablet Take 81 mg by mouth daily.    [provider]  atorvastatin (LIPITOR) 40 MG tablet Take 1 tablet (40 mg total) by mouth daily. 08/30/21 08/25/22  Ria Bush, MD  Blood Glucose Monitoring Suppl ([place]) w/Device KIT  Check blood sugar once daily 02/05/21   Ria Bush, MD  BRILINTA 90 MG TABS tablet TAKE 1 TABLET BY MOUTH 2 TIMES DAILY. 10/17/21   Ria Bush, MD  Cranberry 500 MG CAPS Take 2 capsules (1,000 mg total) by mouth daily. 02/03/19   Ria Bush, MD  DULoxetine (CYMBALTA) 30 MG capsule TAKE 1 CAPSULE BY MOUTH EVERY DAY 08/30/21   Ria Bush, MD  finasteride (PROSCAR) 5 MG tablet Take 1 tablet (5 mg total) by mouth daily. 08/30/21   Ria Bush, MD  fluticasone [facility]) 50 MCG/ACT nasal spray SPRAY 2 SPRAYS INTO EACH NOSTRIL EVERY DAY 10/29/21   Ria Bush, MD  furosemide (LASIX) 20 MG tablet Take 1 tablet (20 mg total) by mouth daily. 08/30/21   Ria Bush, MD  gabapentin (NEURONTIN) 300 MG capsule Take 2 capsules (600 mg total) by mouth 2 (two) times daily. 08/30/21   Ria Bush, MD  glucose blood [facility] VERIO) test strip Check blood sugar once daily 02/05/21   Ria Bush, MD  hydrochlorothiazide (HYDRODIURIL) 25 MG tablet Take 1 tablet (25 mg total) by mouth daily. 08/30/21   Ria Bush, MD  isosorbide mononitrate (IMDUR) 30 MG 24 hr tablet Take 0.5 tablets (15 mg total) by mouth daily. 03/07/21 06/28/21  Theora Gianotti, NP  ketoconazole (NIZORAL) 2 % cream Apply topically. 06/25/21   [provider]  losartan (COZAAR) 50 MG tablet Take 1 tablet (50 mg total) by mouth daily. 08/30/21   Ria Bush, MD  metFORMIN (GLUCOPHAGE) 500 MG tablet Take 1 tablet (500 mg total) by mouth 2 (two) times daily with a meal. 08/30/21   Ria Bush, MD  OneTouch Delica Lancets 93Y MISC Check blood sugar once daily 02/05/21   Ria Bush, MD  polyethylene glycol powder (GLYCOLAX/MIRALAX) 17 GM/SCOOP powder TAKE 8.5-17 G BY MOUTH DAILY AS NEEDED FOR MODERATE CONSTIPATION. 10/18/20   Ria Bush, MD  traZODone (DESYREL) 50 MG tablet TAKE 1 tablet BY MOUTH AT BEDTIME AS NEEDED FOR SLEEP 02/23/21   Ria Bush, MD     Family History Family History  Problem Relation Age of Onset   Kidney failure Mother    Coronary artery disease Mother    Heart failure Mother    Hypertension Brother    Heart attack Brother    Alcohol abuse Brother    Hypertension Brother    Hypertension Sister     Social History Social History   Tobacco Use   Smoking status: Never   Smokeless tobacco: Never  Vaping Use   Vaping Use: Never used  Substance Use Topics   Alcohol use: No    Alcohol/week: 0.0 standard drinks of alcohol   Drug use: No     Allergies   Morphine sulfate, Nitroglycerin, and Contrast media [iodinated contrast media]   Review of Systems Review of  Systems: negative unless otherwise stated in HPI.      Physical Exam Triage Vital Signs ED Triage Vitals  Enc Vitals Group     BP 02/01/22 1157 (!) 154/69     Pulse Rate 02/01/22 1157 68     Resp 02/01/22 1157 15     Temp 02/01/22 1157 98.1 F (36.7 C)     Temp Source 02/01/22 1157 Oral     SpO2 02/01/22 1157 96 %     Weight 02/01/22 1154 175 lb (79.4 kg)     Height 02/01/22 1154 5' 6.4" (1.687 m)     Head Circumference --      Peak Flow --      Pain Score 02/01/22 1154 0     Pain Loc --      Pain Edu? --      Excl. in [place]? --    No data found.  Updated Vital Signs BP (!) 154/69 (BP Location: Left Arm)   Pulse 68   Temp 98.1 F (36.7 C) (Oral)   Resp 15   Ht 5' 6.4" (1.687 m)   Wt 79.4 kg   SpO2 96%   BMI 27.91 kg/m   Visual Acuity Right Eye Distance:   Left Eye Distance:   Bilateral Distance:    Right Eye Near:   Left Eye Near:    Bilateral Near:     Physical Exam GEN:     alert, non-toxic appearing elderly male in no distress    HENT:  mucus membranes moist, clear nasal discharge, edematous and erythematous turbinates, maxillary sinus tenderness  EYES:   pupils equal and reactive, no scleral injection or discharge NECK:  ROM at baseline, no midline tenderness   RESP:  no increased work of breathing, clear to  auscultation bilaterally CVS:   regular rate and rhythm Skin:   warm and dry, senile purpura on upper extremities     UC Treatments / Results  Labs (all labs ordered are listed, but only abnormal results are displayed) Labs Reviewed  SARS CORONAVIRUS 2 BY RT PCR    EKG   Radiology No results found.  Procedures Procedures (including critical care time)  Medications Ordered in UC Medications - No data to display  Initial Impression / Assessment and Plan / UC Course  I have reviewed the triage vital signs and the nursing notes.  Pertinent labs & imaging results that were available during my care of the patient were reviewed by me and considered in my medical decision making (see chart for details).       Pt is a 87 y.o. male who presents for persistent cough and nasal congestion over the past week. [place] is afebrile here without recent antipyretics. Satting well on room air. Overall pt is non-toxic appearing, well hydrated, without respiratory distress. Pulmonary exam is unremarkable.  COVID was negative despite home exposure. Discussed symptomatic treatment.  Explained lack of efficacy of antibiotics in viral disease however after shared decision making will prescribe antibiotics for delayed use, if needed.  Typical duration of symptoms discussed.   Return and ED precautions given and voiced understanding. Discussed MDM, treatment plan and plan for follow-up with patient who agrees with plan.     Final Clinical Impressions(s) / UC Diagnoses   Final diagnoses:  Acute non-recurrent maxillary sinusitis     Discharge Instructions      You test for COVID is negative.  Stop by the pharmacy to pick up your antibiotics.    It is  important to stay hydrated: drink plenty of fluids (water, gatorade/powerade/pedialyte, juices, or teas) to keep your throat moisturized and help further relieve irritation/discomfort.    Return or go to the urgent care, if symptoms worsen or do  not improve in the next few days     ED Prescriptions     Medication Sig Dispense Auth. Provider   amoxicillin (AMOXIL) 500 MG tablet Take 1 tablet (500 mg total) by mouth 2 (two) times daily for 7 days. 14 tablet Milton Sagona, Ronnette Juniper, DO      PDMP not reviewed this encounter.   Lyndee Hensen, DO 02/01/22 1335

## 2022-02-01 NOTE — ED Triage Notes (Signed)
Patient c/o nasal congestion, HA, cough, that started 2 days ago.  Patient unsure of fevers.

## 2022-02-18 ENCOUNTER — Other Ambulatory Visit: Payer: Self-pay | Admitting: Nurse Practitioner

## 2022-02-18 DIAGNOSIS — I200 Unstable angina: Secondary | ICD-10-CM

## 2022-02-27 DIAGNOSIS — L578 Other skin changes due to chronic exposure to nonionizing radiation: Secondary | ICD-10-CM | POA: Diagnosis not present

## 2022-02-27 DIAGNOSIS — Z85820 Personal history of malignant melanoma of skin: Secondary | ICD-10-CM | POA: Diagnosis not present

## 2022-02-27 DIAGNOSIS — Z872 Personal history of diseases of the skin and subcutaneous tissue: Secondary | ICD-10-CM | POA: Diagnosis not present

## 2022-02-27 DIAGNOSIS — L298 Other pruritus: Secondary | ICD-10-CM | POA: Diagnosis not present

## 2022-02-27 DIAGNOSIS — Z86018 Personal history of other benign neoplasm: Secondary | ICD-10-CM | POA: Diagnosis not present

## 2022-02-27 DIAGNOSIS — L218 Other seborrheic dermatitis: Secondary | ICD-10-CM | POA: Diagnosis not present

## 2022-02-27 DIAGNOSIS — L821 Other seborrheic keratosis: Secondary | ICD-10-CM | POA: Diagnosis not present

## 2022-02-27 DIAGNOSIS — L249 Irritant contact dermatitis, unspecified cause: Secondary | ICD-10-CM | POA: Diagnosis not present

## 2022-02-27 DIAGNOSIS — Z859 Personal history of malignant neoplasm, unspecified: Secondary | ICD-10-CM | POA: Diagnosis not present

## 2022-03-04 ENCOUNTER — Encounter: Payer: Self-pay | Admitting: Family Medicine

## 2022-03-04 ENCOUNTER — Ambulatory Visit (INDEPENDENT_AMBULATORY_CARE_PROVIDER_SITE_OTHER): Payer: Medicare HMO | Admitting: Family Medicine

## 2022-03-04 VITALS — BP 130/70 | HR 60 | Temp 97.7°F | Ht 66.4 in | Wt 176.1 lb

## 2022-03-04 DIAGNOSIS — E8889 Other specified metabolic disorders: Secondary | ICD-10-CM | POA: Diagnosis not present

## 2022-03-04 DIAGNOSIS — E1140 Type 2 diabetes mellitus with diabetic neuropathy, unspecified: Secondary | ICD-10-CM | POA: Diagnosis not present

## 2022-03-04 DIAGNOSIS — E1142 Type 2 diabetes mellitus with diabetic polyneuropathy: Secondary | ICD-10-CM | POA: Diagnosis not present

## 2022-03-04 DIAGNOSIS — R296 Repeated falls: Secondary | ICD-10-CM | POA: Diagnosis not present

## 2022-03-04 DIAGNOSIS — I639 Cerebral infarction, unspecified: Secondary | ICD-10-CM | POA: Diagnosis not present

## 2022-03-04 LAB — POCT GLYCOSYLATED HEMOGLOBIN (HGB A1C): Hemoglobin A1C: 6.9 % — AB (ref 4.0–5.6)

## 2022-03-04 MED ORDER — ASPIRIN 81 MG PO TBEC: 81.0000 mg | DELAYED_RELEASE_TABLET | ORAL | Status: DC

## 2022-03-04 MED ORDER — ONETOUCH VERIO FLEX SYSTEM W/DEVICE KIT
PACK | 0 refills | Status: DC
Start: 2022-03-04 — End: 2022-03-20

## 2022-03-04 NOTE — Progress Notes (Signed)
Patient ID: Aaron Stafford, male    DOB: 10-30-1934, 88 y.o.   MRN: [id number]  This visit was conducted in person.  BP 130/70   Pulse 60   Temp 97.7 F (36.5 C) (Temporal)   Ht 5' 6.4" (1.687 m)   Wt 176 lb 2 oz (79.9 kg)   SpO2 99%   BMI 28.09 kg/m    CC: 6 mo DM f/u visit  Subjective:   HPI: Aaron Stafford is a 88 y.o. male presenting on 03/04/2022 for Medical Management of Chronic Issues (Here for 6 mo DM f/u.)   Continues preaching twice weekly, teaches Sunday school.   He's had several falls this past year - attributes to imbalance after cerebellar strokes. Has not completed balance training program.   Hospitalized 06/2021 for acute stroke - small acute infarct of L cerebellar tonsil with occluded L vertebral artery along with severe stenosis of L V4 segment. Subsequent repeat stroke 2 wks later (superior R cerebellar vermis). Treated with brilinta + aspirin for 3 months, now on aspirin indefinitely. He is also now on atorvastatin '40mg'$  daily.   Last saw neurology 01/15/2022 - now on plavix '75mg'$  daily with aspirin '81mg'$  MWF as well as gabapentin '600mg'$  BID. Notes occasional pain to back of head - "across brain" similar to before having stroke.  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 I will forward these results to [facility].   DM - does not regularly check sugars. Compliant with antihyperglycemic regimen which includes: metformin '500mg'$  bid. Denies low sugars or hypoglycemic symptoms. Denies paresthesias, blurry vision. Last diabetic eye exam 2021 - DUE. Glucometer brand: onetouch verio flex system - requests new meter. Last foot exam: 02/2021. DSME: declined. Lab Results  Component Value Date   HGBA1C 6.9 (A) 03/04/2022   Diabetic Foot Exam - Simple   Simple Foot Form Diabetic Foot exam was performed with the following findings: Yes 03/04/2022  9:01 AM  Visual Inspection See comments: Yes Sensation Testing Intact to  touch and monofilament testing bilaterally: Yes Pulse Check See comments: Yes Comments Diminished pedal pulses  Thickened onychomycotic nails    Lab Results  Component Value Date   MICROALBUR 4.0 (H) 08/16/2020         Relevant past medical, surgical, family and social history reviewed and updated as indicated. Interim medical history since our last visit reviewed. Allergies and medications reviewed and updated. Outpatient Medications Prior to Visit  Medication Sig Dispense Refill   Alpha-Lipoic Acid 600 MG CAPS Take 1 capsule (600 mg total) by mouth 2 (two) times daily.     atorvastatin (LIPITOR) 40 MG tablet Take 1 tablet (40 mg total) by mouth daily. 90 tablet 3   clopidogrel (PLAVIX) 75 MG tablet Take 1 tablet (75 mg total) by mouth daily.     Cranberry 500 MG CAPS Take 2 capsules (1,000 mg total) by mouth daily.     DULoxetine (CYMBALTA) 30 MG capsule TAKE 1 CAPSULE BY MOUTH EVERY DAY 90 capsule 3   finasteride (PROSCAR) 5 MG tablet Take 1 tablet (5 mg total) by mouth daily. 90 tablet 3   fluticasone (FLONASE) 50 MCG/ACT nasal spray SPRAY 2 SPRAYS INTO EACH NOSTRIL EVERY DAY 48 mL 3   furosemide (LASIX) 20 MG tablet Take 1 tablet (20 mg total) by mouth daily. 90 tablet 3   gabapentin (NEURONTIN) 300 MG capsule Take 2 capsules (600 mg total) by mouth 2 (two) times daily. 360 capsule  3   glucose blood (ONETOUCH VERIO) test strip Check blood sugar once daily 100 strip 3   hydrochlorothiazide (HYDRODIURIL) 25 MG tablet Take 1 tablet (25 mg total) by mouth daily. 90 tablet 3   isosorbide mononitrate (IMDUR) 30 MG 24 hr tablet TAKE 1/2 OF A TABLET (15 MG TOTAL) BY MOUTH DAILY 45 tablet 3   ketoconazole (NIZORAL) 2 % cream Apply topically.     losartan (COZAAR) 50 MG tablet Take 1 tablet (50 mg total) by mouth daily. 90 tablet 3   metFORMIN (GLUCOPHAGE) 500 MG tablet Take 1 tablet (500 mg total) by mouth 2 (two) times daily with a meal. 180 tablet 3   OneTouch Delica Lancets 73Z MISC  Check blood sugar once daily 100 each 3   polyethylene glycol powder (GLYCOLAX/MIRALAX) 17 GM/SCOOP powder TAKE 8.5-17 G BY MOUTH DAILY AS NEEDED FOR MODERATE CONSTIPATION. 510 g 1   traZODone (DESYREL) 50 MG tablet TAKE 1 tablet BY MOUTH AT BEDTIME AS NEEDED FOR SLEEP 90 tablet 3   aspirin 81 MG tablet Take 81 mg by mouth daily.     Blood Glucose Monitoring Suppl (ONETOUCH VERIO FLEX SYSTEM) w/Device KIT Check blood sugar once daily 1 kit 0   BRILINTA 90 MG TABS tablet TAKE 1 TABLET BY MOUTH 2 TIMES DAILY. 60 tablet 2   No facility-administered medications prior to visit.     Per HPI unless specifically indicated in ROS section below Review of Systems  Objective:  BP 130/70   Pulse 60   Temp 97.7 F (36.5 C) (Temporal)   Ht 5' 6.4" (1.687 m)   Wt 176 lb 2 oz (79.9 kg)   SpO2 99%   BMI 28.09 kg/m   Wt Readings from Last 3 Encounters:  03/04/22 176 lb 2 oz (79.9 kg)  02/01/22 175 lb (79.4 kg)  08/29/21 180 lb (81.6 kg)      Physical Exam Vitals and nursing note reviewed.  Constitutional:      Appearance: Normal appearance. He is not ill-appearing.  Eyes:     Extraocular Movements: Extraocular movements intact.     Conjunctiva/sclera: Conjunctivae normal.     Pupils: Pupils are equal, round, and reactive to light.  Cardiovascular:     Rate and Rhythm: Normal rate and regular rhythm.     Pulses: Normal pulses.     Heart sounds: Normal heart sounds. No murmur heard. Pulmonary:     Effort: Pulmonary effort is normal. No respiratory distress.     Breath sounds: Normal breath sounds. No wheezing, rhonchi or rales.  Musculoskeletal:     Right lower leg: No edema.     Left lower leg: No edema.     Comments: See HPI for foot exam if done  Skin:    General: Skin is warm and dry.     Findings: No rash.  Neurological:     Mental Status: He is alert.  Psychiatric:        Mood and Affect: Mood normal.        Behavior: Behavior normal.       Results for orders placed or  performed in visit on 03/04/22  POCT glycosylated hemoglobin (Hb A1C)  Result Value Ref Range   Hemoglobin A1C 6.9 (A) 4.0 - 5.6 %   HbA1c POC (<> result, manual entry)     HbA1c, POC (prediabetic range)     HbA1c, POC (controlled diabetic range)      Assessment & Plan:   Problem List Items Addressed  This Visit     Type 2 diabetes, controlled, with peripheral neuropathy ([place]) - Primary    Chronic, stable period only on metformin - continue.  Declines DSME.       Relevant Medications   aspirin EC 81 MG tablet   Blood Glucose Monitoring Suppl ([place]) w/Device KIT   Other Relevant Orders   POCT glycosylated hemoglobin (Hb A1C) (Completed)   Type 2 diabetes mellitus with diabetic neuropathy, unspecified (HCC)    Stable period - continues gabapentin '600mg'$  BID      Relevant Medications   aspirin EC 81 MG tablet   Cerebellar stroke, acute , recurrent(HCC)    Now on plavix daily + aspirin MWF. Followed by [facility]rology.       Relevant Medications   aspirin EC 81 MG tablet   Other Relevant Orders   Ambulatory referral to Physical Therapy   Recurrent falls    He notes recurrent falls, attributable to imbalance since 2 cerebellar strokes 06/2021.  Will refer to outpatient PT for balance training/fall prevention.       Relevant Orders   Ambulatory referral to Physical Therapy   CYP2C19 rapid metabolizer [facility])    Will notify neurology about CYP2C19 rapid metabolizer status as fyi.         Meds ordered this encounter  Medications   aspirin EC 81 MG tablet    Sig: Take 1 tablet (81 mg total) by mouth every Monday, Wednesday, and Friday. Swallow whole.   Blood Glucose Monitoring Suppl ([place] FLEX SYSTEM) w/Device KIT    Sig: Check blood sugar once daily    Dispense:  1 kit    Refill:  0    Orders Placed This Encounter  Procedures   Ambulatory referral to Physical Therapy    Referral Priority:   Routine    Referral Type:   Physical  Medicine    Referral Reason:   Specialty Services Required    Requested Specialty:   Physical Therapy    Number of Visits Requested:   1   POCT glycosylated hemoglobin (Hb A1C)    Patient Instructions  We will refer you to outpatient balance training/fall prevention program after cerebellar strokes.  Sugars are doing well - continue to limit added sugars and processed carbs in diet.  Try over the counter antifungal lotrimin for rash.  Continue current medicines.  Return in 6 months for physical/wellness visit   Follow up plan: Return in about 6 months (around 09/02/2022) for medicare wellness visit, annual exam, prior fasting for blood work.  Ria Bush, MD

## 2022-03-04 NOTE — Assessment & Plan Note (Signed)
Stable period - continues gabapentin '600mg'$  BID

## 2022-03-04 NOTE — Assessment & Plan Note (Addendum)
Chronic, stable period only on metformin - continue.  Declines DSME.

## 2022-03-04 NOTE — Assessment & Plan Note (Addendum)
Will notify neurology about CYP2C19 rapid metabolizer status as fyi.

## 2022-03-04 NOTE — Assessment & Plan Note (Signed)
He notes recurrent falls, attributable to imbalance since 2 cerebellar strokes 06/2021.  Will refer to outpatient PT for balance training/fall prevention.

## 2022-03-04 NOTE — Assessment & Plan Note (Addendum)
Now on plavix daily + aspirin MWF. Followed by [facility].

## 2022-03-04 NOTE — Patient Instructions (Addendum)
We will refer you to outpatient balance training/fall prevention program after cerebellar strokes.  Sugars are doing well - continue to limit added sugars and processed carbs in diet.  Try over the counter antifungal lotrimin for rash.  Continue current medicines.  Return in 6 months for physical/wellness visit

## 2022-03-06 ENCOUNTER — Ambulatory Visit: Payer: Medicare HMO

## 2022-03-08 ENCOUNTER — Ambulatory Visit: Payer: Medicare HMO

## 2022-03-11 ENCOUNTER — Ambulatory Visit: Payer: Medicare HMO

## 2022-03-13 ENCOUNTER — Ambulatory Visit: Payer: Medicare HMO

## 2022-03-18 ENCOUNTER — Ambulatory Visit: Payer: Medicare HMO

## 2022-03-19 ENCOUNTER — Ambulatory Visit: Payer: Medicare HMO

## 2022-03-20 ENCOUNTER — Other Ambulatory Visit (HOSPITAL_COMMUNITY): Payer: Self-pay

## 2022-03-20 ENCOUNTER — Ambulatory Visit: Payer: Medicare HMO

## 2022-03-20 ENCOUNTER — Telehealth: Payer: Self-pay

## 2022-03-20 MED ORDER — ACCU-CHEK SOFTCLIX LANCETS MISC: 3 refills | Status: AC

## 2022-03-20 MED ORDER — ACCU-CHEK AVIVA PLUS VI STRP
ORAL_STRIP | 3 refills | Status: AC
Start: 2022-03-20 — End: ?

## 2022-03-20 MED ORDER — ACCU-CHEK AVIVA PLUS W/DEVICE KIT
PACK | 0 refills | Status: DC
Start: 2022-03-20 — End: 2023-04-16

## 2022-03-20 NOTE — Telephone Encounter (Signed)
Noted  

## 2022-03-20 NOTE — Telephone Encounter (Signed)
Pharmacy Patient Advocate Encounter   Received notification from [place] that prior authorization for [place] w/Device Kit is required/requested.  Per Test Claim: requires a prior auth, preferred Accu-chek or True Metrix  Receive a paid test claim for the Accu-Chek kit.    Key [id number]  Could the order be changed to either Accu-chek or True Metrix supplies? A prior auth would not be needed for either of those.  Please advise. Status is not sent.

## 2022-03-20 NOTE — Telephone Encounter (Signed)
Order changed to accuchek and sent to [facility] - plz notify pt.

## 2022-03-21 ENCOUNTER — Telehealth: Payer: Self-pay

## 2022-03-21 NOTE — Telephone Encounter (Signed)
Received faxed message from [facility] stating the Accu-Chek Aviva Plus glucometer is no longer being made.   Spoke with Aaron Stafford relaying message above. Encouraged Aaron Stafford to contact his ins co to find out what brand and model glucometer they cover, then let us know. Aaron Stafford verbalize understanding and will let us know what he finds out.

## 2022-03-24 ENCOUNTER — Other Ambulatory Visit: Payer: Self-pay | Admitting: Family Medicine

## 2022-03-24 DIAGNOSIS — G4700 Insomnia, unspecified: Secondary | ICD-10-CM

## 2022-03-25 ENCOUNTER — Ambulatory Visit: Payer: Medicare HMO

## 2022-03-25 NOTE — Telephone Encounter (Signed)
Strength increased to 30 mg. (See 08/30/21 OV notes.)  Cymbalta Last rx: 08/30/21, #90 Last OV: 03/04/22, 6 mo f/u Next OV: 09/03/22,

## 2022-03-26 NOTE — Telephone Encounter (Signed)
ERx 

## 2022-03-27 ENCOUNTER — Ambulatory Visit: Payer: Medicare HMO

## 2022-04-01 ENCOUNTER — Ambulatory Visit: Payer: Medicare HMO

## 2022-04-03 ENCOUNTER — Ambulatory Visit: Payer: Medicare HMO

## 2022-04-08 ENCOUNTER — Ambulatory Visit: Payer: Medicare HMO

## 2022-04-10 ENCOUNTER — Ambulatory Visit: Payer: Medicare HMO

## 2022-04-15 ENCOUNTER — Ambulatory Visit: Payer: Medicare HMO

## 2022-04-17 ENCOUNTER — Ambulatory Visit: Payer: Medicare HMO

## 2022-04-22 ENCOUNTER — Ambulatory Visit: Payer: Medicare HMO

## 2022-04-24 ENCOUNTER — Ambulatory Visit: Payer: Medicare HMO

## 2022-04-29 ENCOUNTER — Ambulatory Visit: Payer: Medicare HMO

## 2022-05-01 ENCOUNTER — Ambulatory Visit: Payer: Medicare HMO

## 2022-05-06 ENCOUNTER — Ambulatory Visit: Payer: Medicare HMO

## 2022-05-08 ENCOUNTER — Ambulatory Visit: Payer: Medicare HMO

## 2022-05-23 ENCOUNTER — Emergency Department
Admission: EM | Admit: 2022-05-23 | Discharge: 2022-05-23 | Disposition: A | Discharge status: Home / Self Care | Payer: Medicare HMO | Attending: Emergency Medicine | Admitting: Emergency Medicine

## 2022-05-23 ENCOUNTER — Emergency Department: Payer: Medicare HMO

## 2022-05-23 DIAGNOSIS — R079 Chest pain, unspecified: Secondary | ICD-10-CM | POA: Diagnosis not present

## 2022-05-23 DIAGNOSIS — M47812 Spondylosis without myelopathy or radiculopathy, cervical region: Secondary | ICD-10-CM | POA: Diagnosis not present

## 2022-05-23 DIAGNOSIS — I10 Essential (primary) hypertension: Secondary | ICD-10-CM | POA: Insufficient documentation

## 2022-05-23 DIAGNOSIS — R0789 Other chest pain: Secondary | ICD-10-CM | POA: Insufficient documentation

## 2022-05-23 DIAGNOSIS — S0003XA Contusion of scalp, initial encounter: Secondary | ICD-10-CM | POA: Insufficient documentation

## 2022-05-23 DIAGNOSIS — E119 Type 2 diabetes mellitus without complications: Secondary | ICD-10-CM | POA: Diagnosis not present

## 2022-05-23 DIAGNOSIS — S0990XA Unspecified injury of head, initial encounter: Secondary | ICD-10-CM | POA: Diagnosis not present

## 2022-05-23 DIAGNOSIS — I2510 Atherosclerotic heart disease of native coronary artery without angina pectoris: Secondary | ICD-10-CM | POA: Diagnosis not present

## 2022-05-23 DIAGNOSIS — Y92410 Unspecified street and highway as the place of occurrence of the external cause: Secondary | ICD-10-CM | POA: Diagnosis not present

## 2022-05-23 DIAGNOSIS — M7918 Myalgia, other site: Secondary | ICD-10-CM

## 2022-05-23 LAB — CBC
HCT: 41 % (ref 39.0–52.0)
Hemoglobin: 13.9 g/dL (ref 13.0–17.0)
MCH: 30.5 pg (ref 26.0–34.0)
MCHC: 33.9 g/dL (ref 30.0–36.0)
MCV: 90.1 fL (ref 80.0–100.0)
Platelets: 214 10*3/uL (ref 150–400)
RBC: 4.55 MIL/uL (ref 4.22–5.81)
RDW: 13 % (ref 11.5–15.5)
WBC: 8.4 10*3/uL (ref 4.0–10.5)
nRBC: 0 % (ref 0.0–0.2)

## 2022-05-23 LAB — TROPONIN I (HIGH SENSITIVITY)
Troponin I (High Sensitivity): 26 ng/L — ABNORMAL HIGH (ref <18)
Troponin I (High Sensitivity): 27 ng/L — ABNORMAL HIGH (ref <18)

## 2022-05-23 LAB — BASIC METABOLIC PANEL
Anion gap: 10 (ref 5–15)
BUN: 29 mg/dL — ABNORMAL HIGH (ref 8–23)
CO2: 30 mmol/L (ref 22–32)
Calcium: 9.7 mg/dL (ref 8.9–10.3)
Chloride: 97 mmol/L — ABNORMAL LOW (ref 98–111)
Creatinine, Ser: 1.14 mg/dL (ref 0.61–1.24)
GFR, Estimated: >60 mL/min (ref >60)
Glucose, Bld: 212 mg/dL — ABNORMAL HIGH (ref 70–99)
Potassium: 3.1 mmol/L — ABNORMAL LOW (ref 3.5–5.1)
Sodium: 137 mmol/L (ref 135–145)
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
## 2022-05-23 NOTE — Discharge Instructions (Signed)
Your CT scan of the head and your lab tests were all okay.  You can continue doing normal daily activities, and take Tylenol as needed for soreness.

## 2022-05-29 ENCOUNTER — Telehealth: Payer: Self-pay

## 2022-05-29 NOTE — Telephone Encounter (Signed)
     Patient  visit on 4/4  at [facility]   Have you been able to follow up with your primary care physician? Yes   The patient was or was not able to obtain any needed medicine or equipment. Yes   Are there diet recommendations that you are having difficulty following? Na   Patient expresses understanding of discharge instructions and education provided has no other needs at this time.  Yes      Lenard Forth [facility] Guide, [facility] [phone] [address] [place], [place], [place] [address] Phone: [phone] Email: [email or web address]

## 2022-06-18 ENCOUNTER — Emergency Department (HOSPITAL_COMMUNITY): Payer: Medicare HMO

## 2022-06-18 ENCOUNTER — Encounter (HOSPITAL_COMMUNITY): Payer: Self-pay

## 2022-06-18 ENCOUNTER — Emergency Department (HOSPITAL_COMMUNITY)
Admission: EM | Admit: 2022-06-18 | Discharge: 2022-06-18 | Disposition: A | Discharge status: Home / Self Care | Payer: Medicare HMO | Attending: Emergency Medicine | Admitting: Emergency Medicine

## 2022-06-18 DIAGNOSIS — I509 Heart failure, unspecified: Secondary | ICD-10-CM | POA: Insufficient documentation

## 2022-06-18 DIAGNOSIS — M791 Myalgia, unspecified site: Secondary | ICD-10-CM | POA: Diagnosis not present

## 2022-06-18 DIAGNOSIS — M47812 Spondylosis without myelopathy or radiculopathy, cervical region: Secondary | ICD-10-CM | POA: Diagnosis not present

## 2022-06-18 DIAGNOSIS — R079 Chest pain, unspecified: Secondary | ICD-10-CM | POA: Diagnosis not present

## 2022-06-18 DIAGNOSIS — Y92410 Unspecified street and highway as the place of occurrence of the external cause: Secondary | ICD-10-CM | POA: Diagnosis not present

## 2022-06-18 DIAGNOSIS — S0990XA Unspecified injury of head, initial encounter: Secondary | ICD-10-CM | POA: Insufficient documentation

## 2022-06-18 DIAGNOSIS — Z7982 Long term (current) use of aspirin: Secondary | ICD-10-CM | POA: Diagnosis not present

## 2022-06-18 DIAGNOSIS — I110 Hypertensive heart disease with heart failure: Secondary | ICD-10-CM | POA: Diagnosis not present

## 2022-06-18 DIAGNOSIS — M542 Cervicalgia: Secondary | ICD-10-CM | POA: Diagnosis not present

## 2022-06-18 DIAGNOSIS — Z79899 Other long term (current) drug therapy: Secondary | ICD-10-CM | POA: Insufficient documentation

## 2022-06-18 DIAGNOSIS — Z7984 Long term (current) use of oral hypoglycemic drugs: Secondary | ICD-10-CM | POA: Insufficient documentation

## 2022-06-18 DIAGNOSIS — R0789 Other chest pain: Secondary | ICD-10-CM | POA: Diagnosis not present

## 2022-06-18 DIAGNOSIS — E119 Type 2 diabetes mellitus without complications: Secondary | ICD-10-CM | POA: Diagnosis not present

## 2022-06-18 DIAGNOSIS — R519 Headache, unspecified: Secondary | ICD-10-CM | POA: Diagnosis not present

## 2022-06-18 MED ORDER — ACETAMINOPHEN 325 MG PO TABS
650.0000 mg | ORAL_TABLET | Freq: Once | ORAL | Status: AC
  Administered 2022-06-18: 650 mg via ORAL
  Filled 2022-06-18: qty 2

## 2022-06-18 NOTE — Discharge Instructions (Signed)
Your CT scans were reassuring today.  You can take over-the-counter Tylenol as needed for any discomfort.  Follow-up closely with your primary care doctor, come back to the ER for any new or worsening symptoms.

## 2022-06-18 NOTE — ED Provider Notes (Signed)
  Physical Exam  BP 129/69   Pulse 66   Temp 97.9 F (36.6 C) (Oral)   Resp 20   SpO2 97%   Physical Exam  Procedures  Procedures  ED Course / MDM    Medical Decision Making Amount and/or Complexity of Data Reviewed Radiology: ordered.  Risk OTC drugs.   This patient's care was assumed by me at shift change. The tests pending are CT head and C-spine. Plan is for discharge after results.   Patient was re-evaluated by me as well. I discussed their result with them and plan is for discharge home with OTC medication as needed, CT head and C-spine are reassuring.        Josem Kaufmann 06/18/22 1652    Rondel Baton, MD 06/18/22 [phone]
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
## 2022-06-18 NOTE — ED Notes (Signed)
Pt A&OX4 ambulatory at d/c with independent steady gait. Pt verbalized understanding of d/c instructions and follow up care. 
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
## 2022-07-17 ENCOUNTER — Ambulatory Visit
Admission: EM | Admit: 2022-07-17 | Discharge: 2022-07-17 | Disposition: A | Discharge status: Home / Self Care | Payer: Medicare HMO | Attending: Physician Assistant | Admitting: Physician Assistant

## 2022-07-17 ENCOUNTER — Ambulatory Visit (INDEPENDENT_AMBULATORY_CARE_PROVIDER_SITE_OTHER): Payer: Medicare HMO

## 2022-07-17 ENCOUNTER — Other Ambulatory Visit: Payer: Self-pay

## 2022-07-17 DIAGNOSIS — R5383 Other fatigue: Secondary | ICD-10-CM | POA: Diagnosis not present

## 2022-07-17 DIAGNOSIS — I110 Hypertensive heart disease with heart failure: Secondary | ICD-10-CM | POA: Diagnosis not present

## 2022-07-17 DIAGNOSIS — I252 Old myocardial infarction: Secondary | ICD-10-CM | POA: Diagnosis not present

## 2022-07-17 DIAGNOSIS — R059 Cough, unspecified: Secondary | ICD-10-CM | POA: Diagnosis not present

## 2022-07-17 DIAGNOSIS — I5032 Chronic diastolic (congestive) heart failure: Secondary | ICD-10-CM | POA: Insufficient documentation

## 2022-07-17 DIAGNOSIS — Z1152 Encounter for screening for COVID-19: Secondary | ICD-10-CM | POA: Diagnosis not present

## 2022-07-17 DIAGNOSIS — J310 Chronic rhinitis: Secondary | ICD-10-CM | POA: Diagnosis not present

## 2022-07-17 DIAGNOSIS — B9789 Other viral agents as the cause of diseases classified elsewhere: Secondary | ICD-10-CM | POA: Insufficient documentation

## 2022-07-17 DIAGNOSIS — E119 Type 2 diabetes mellitus without complications: Secondary | ICD-10-CM | POA: Insufficient documentation

## 2022-07-17 DIAGNOSIS — E785 Hyperlipidemia, unspecified: Secondary | ICD-10-CM | POA: Diagnosis not present

## 2022-07-17 DIAGNOSIS — R051 Acute cough: Secondary | ICD-10-CM

## 2022-07-17 DIAGNOSIS — Z7984 Long term (current) use of oral hypoglycemic drugs: Secondary | ICD-10-CM | POA: Insufficient documentation

## 2022-07-17 DIAGNOSIS — J069 Acute upper respiratory infection, unspecified: Secondary | ICD-10-CM | POA: Insufficient documentation

## 2022-07-17 DIAGNOSIS — R0981 Nasal congestion: Secondary | ICD-10-CM | POA: Insufficient documentation

## 2022-07-17 DIAGNOSIS — B349 Viral infection, unspecified: Secondary | ICD-10-CM | POA: Diagnosis not present

## 2022-07-17 DIAGNOSIS — J029 Acute pharyngitis, unspecified: Secondary | ICD-10-CM | POA: Diagnosis present

## 2022-07-17 DIAGNOSIS — I259 Chronic ischemic heart disease, unspecified: Secondary | ICD-10-CM | POA: Diagnosis not present

## 2022-07-17 DIAGNOSIS — I2510 Atherosclerotic heart disease of native coronary artery without angina pectoris: Secondary | ICD-10-CM | POA: Insufficient documentation

## 2022-07-17 LAB — GROUP A STREP BY PCR: Group A Strep by PCR: NOT DETECTED

## 2022-07-17 LAB — SARS CORONAVIRUS 2 BY RT PCR: SARS Coronavirus 2 by RT PCR: NEGATIVE

## 2022-07-17 MED ORDER — PROMETHAZINE-DM 6.25-15 MG/5ML PO SYRP: 5.0000 mL | ORAL_SOLUTION | Freq: Four times a day (QID) | ORAL | 0 refills | Status: DC | PRN

## 2022-07-17 MED ORDER — IPRATROPIUM BROMIDE 0.06 % NA SOLN: 2.0000 | Freq: Four times a day (QID) | NASAL | 0 refills | Status: AC

## 2022-07-17 NOTE — ED Triage Notes (Signed)
Pt here for chest congestion, nasal congestion, productive cough, sore throat that started yesterday and pt denies fever. Pt states he has been taken cloricedine liquid and no relief

## 2022-07-17 NOTE — ED Provider Notes (Signed)
[facility]    CSN: [id number] Arrival date & time: 07/17/22  1016      History   Chief Complaint Chief Complaint  Patient presents with   Nasal Congestion   Otalgia   chest congestion    HPI Aaron Stafford is a 88 y.o. male presenting for onset of fatigue, cough, nasal and chest congestion, sore throat yesterday.  Denies fever, breathing difficulty or wheezing, vomiting or diarrhea.  No report of any sick contacts.  He says he has been taking over-the-counter Coricidin without any relief.  Reports that his wife is sick with similar symptoms a couple weeks ago.  Patient's medical history significant for coronary artery disease, chronic rhinitis, type 2 diabetes, hypertension, hyperlipidemia, ischemic heart disease and previous MI.  HPI  Past Medical History:  Diagnosis Date   CAD (coronary artery disease)    a. 1995 s/p CABG x 4 (LIMA->LAD, VG->OM1->OM2, VG->RCA); b. 07/2009 PCI: LM 60, LAD 100ost, LCX 100p, RCA 100, LIMA->LAD atretic, VG->OM1->OM2 patent w/ collats to dLAD, VG->RCA 95 (3.5x15 Vision BMS). EF 55%; c. 02/2021 MV: EF 30% (50-55% by echo), large inferolateral scar w/ HK. No ischemia.   Chronic rhinitis    [place]   COVID-19 virus infection 09/2019   Diabetes mellitus type II    Diastolic dysfunction    a. 03/2021 Echo: EF 50-55%, no rwma, GrI DD. RVSP 35.70mmHg. Mild MR.   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/2021   Cerebellar stroke, acute , recurrent(HCC) 06/12/2021   Recurrent falls    Chronic diastolic CHF  (congestive heart failure) (HCC)    Clavicle enlargement 02/24/2021   Chest tightness 02/24/2021   New onset headache 09/07/2020   Chronic constipation 08/22/2020   Tremor 08/14/2018   Type 2 diabetes mellitus with diabetic neuropathy, unspecified (HCC) 10/05/2015   Advanced care planning/counseling discussion 06/26/2014   BPH with obstruction/lower urinary tract symptoms    Medicare annual wellness visit, subsequent 03/25/2011   S/P CABG (coronary artery bypass graft) 06/07/2010   HTN (hypertension)    Hyperlipidemia associated with type 2 diabetes mellitus (HCC)    Type 2 diabetes, controlled, with peripheral neuropathy (HCC)    GERD (gastroesophageal reflux disease)    Chronic rhinitis    Vitamin D deficiency 04/11/2008   Insomnia 03/14/2008   Urinary incontinence, urge 07/07/2006   Overweight with body mass index (BMI) 25.0-29.9 07/03/2006   ERECTILE DYSFUNCTION 07/01/2006   Coronary atherosclerosis 07/01/2006   Irritable bowel syndrome 07/01/2006    Past Surgical History:  Procedure Laterality Date   ANKLE SURGERY  03/2002   Left ankle fracture   CARDIAC CATHETERIZATION     CHOLECYSTECTOMY  1985   COLONOSCOPY     diverticulosis, tortuous colon with looping Marva Panda)   CORONARY ARTERY BYPASS GRAFT  05/1993   x6 MI Attemped angioplasty   CORONARY STENT PLACEMENT  07/25/2009   Multi-Link VISION Cobalt chromium    CYSTOSCOPY  10/03/08   Normal-Dr. Lonna Cobb  INGUINAL HERNIA REPAIR  11/2000   and ventral hernia repair       Home Medications    Prior to Admission medications   Medication Sig Start Date End Date Taking? Authorizing Provider  Accu-Chek Softclix Lancets lancets Use as instructed to check sugars once daily E11.42 03/20/22  Yes Eustaquio Boyden, MD  Alpha-Lipoic Acid 600 MG CAPS Take 1 capsule (600 mg total) by mouth 2 (two) times daily. 06/26/16  Yes Eustaquio Boyden, MD  aspirin EC 81 MG tablet Take 1 tablet (81 mg total) by mouth every Monday, Wednesday,  and Friday. Swallow whole. 03/04/22  Yes Eustaquio Boyden, MD  atorvastatin (LIPITOR) 40 MG tablet Take 1 tablet (40 mg total) by mouth daily. 08/30/21 08/25/22 Yes Eustaquio Boyden, MD  Blood Glucose Monitoring Suppl (ACCU-CHEK AVIVA PLUS) w/Device KIT Use as directed to check sugars daily. E11.42 03/20/22  Yes Eustaquio Boyden, MD  clopidogrel (PLAVIX) 75 MG tablet Take 1 tablet (75 mg total) by mouth daily. 03/04/22  Yes Eustaquio Boyden, MD  Cranberry 500 MG CAPS Take 2 capsules (1,000 mg total) by mouth daily. 02/03/19  Yes Eustaquio Boyden, MD  DULoxetine (CYMBALTA) 30 MG capsule Take 1 capsule (30 mg total) by mouth daily. 03/26/22  Yes Eustaquio Boyden, MD  finasteride (PROSCAR) 5 MG tablet Take 1 tablet (5 mg total) by mouth daily. 08/30/21  Yes Eustaquio Boyden, MD  fluticasone [facility]) 50 MCG/ACT nasal spray SPRAY 2 SPRAYS INTO EACH NOSTRIL EVERY DAY 10/29/21  Yes Eustaquio Boyden, MD  furosemide (LASIX) 20 MG tablet Take 1 tablet (20 mg total) by mouth daily. 08/30/21  Yes Eustaquio Boyden, MD  gabapentin (NEURONTIN) 300 MG capsule Take 2 capsules (600 mg total) by mouth 2 (two) times daily. 08/30/21  Yes Eustaquio Boyden, MD  glucose blood (ACCU-CHEK AVIVA PLUS) test strip Use as instructed to check sugars daily E11.42 03/20/22  Yes Eustaquio Boyden, MD  hydrochlorothiazide (HYDRODIURIL) 25 MG tablet Take 1 tablet (25 mg total) by mouth daily. 08/30/21  Yes Eustaquio Boyden, MD  ipratropium (ATROVENT) 0.06 % nasal spray Place 2 sprays into both nostrils 4 (four) times daily. 07/17/22  Yes Eusebio Friendly B, PA-C  isosorbide mononitrate (IMDUR) 30 MG 24 hr tablet TAKE 1/2 OF A TABLET (15 MG TOTAL) BY MOUTH DAILY 02/19/22  Yes Gollan, Tollie Pizza, MD  ketoconazole (NIZORAL) 2 % cream Apply topically. 06/25/21  Yes [provider]  losartan (COZAAR) 50 MG tablet Take 1 tablet (50 mg total) by mouth daily. 08/30/21  Yes Eustaquio Boyden, MD  metFORMIN (GLUCOPHAGE) 500 MG tablet Take 1 tablet  (500 mg total) by mouth 2 (two) times daily with a meal. 08/30/21  Yes Eustaquio Boyden, MD  polyethylene glycol powder (GLYCOLAX/MIRALAX) 17 GM/SCOOP powder TAKE 8.5-17 G BY MOUTH DAILY AS NEEDED FOR MODERATE CONSTIPATION. 10/18/20  Yes Eustaquio Boyden, MD  promethazine-dextromethorphan (PROMETHAZINE-DM) 6.25-15 MG/5ML syrup Take 5 mLs by mouth 4 (four) times daily as needed. 07/17/22  Yes Eusebio Friendly B, PA-C  traZODone (DESYREL) 50 MG tablet TAKE 1 TABLET BY MOUTH EVERY DAY AT BEDTIME AS NEEDED FOR SLEEP 03/25/22  Yes Eustaquio Boyden, MD    Family History Family History  Problem Relation Age of Onset   Kidney failure Mother    Coronary artery disease Mother    Heart failure Mother    Hypertension Brother    Heart attack Brother    Alcohol abuse Brother    Hypertension Brother    Hypertension Sister     Social History Social History  Tobacco Use   Smoking status: Never   Smokeless tobacco: Never  Vaping Use   Vaping Use: Never used  Substance Use Topics   Alcohol use: No    Alcohol/week: 0.0 standard drinks of alcohol   Drug use: No     Allergies   Morphine sulfate, Nitroglycerin, and Contrast media [iodinated contrast media]   Review of Systems Review of Systems  Constitutional:  Positive for fatigue. Negative for fever.  HENT:  Positive for congestion, rhinorrhea and sore throat. Negative for sinus pressure and sinus pain.   Respiratory:  Positive for cough. Negative for shortness of breath.   Cardiovascular:  Negative for chest pain.  Gastrointestinal:  Negative for abdominal pain, diarrhea, nausea and vomiting.  Musculoskeletal:  Negative for myalgias.  Neurological:  Negative for weakness, light-headedness and headaches.  Hematological:  Negative for adenopathy.     Physical Exam Triage Vital Signs ED Triage Vitals  Enc Vitals Group     BP      Pulse      Resp      Temp      Temp src      SpO2      Weight      Height      Head Circumference       Peak Flow      Pain Score      Pain Loc      Pain Edu?      Excl. in GC?    No data found.  Updated Vital Signs BP 139/68   Pulse 73   Temp 99.4 F (37.4 C)   Resp 20   SpO2 95%       Physical Exam Vitals and nursing note reviewed.  Constitutional:      General: He is not in acute distress.    Appearance: Normal appearance. He is well-developed.  HENT:     Head: Normocephalic and atraumatic.     Right Ear: Tympanic membrane, ear canal and external ear normal.     Left Ear: Tympanic membrane, ear canal and external ear normal.     Nose: Congestion present.     Mouth/Throat:     Mouth: Mucous membranes are moist.     Pharynx: Oropharynx is clear. Posterior oropharyngeal erythema present.  Eyes:     General: No scleral icterus.    Conjunctiva/sclera: Conjunctivae normal.  Cardiovascular:     Rate and Rhythm: Normal rate and regular rhythm.     Heart sounds: Normal heart sounds.  Pulmonary:     Effort: Pulmonary effort is normal. No respiratory distress.     Breath sounds: Rhonchi (few scattered rhonchi bilateral upper lung fields) present.  Musculoskeletal:     Cervical back: Neck supple.  Skin:    General: Skin is warm and dry.     Capillary Refill: Capillary refill takes less than 2 seconds.  Neurological:     General: No focal deficit present.     Mental Status: He is alert. Mental status is at baseline.     Motor: No weakness.     Gait: Gait normal.  Psychiatric:        Mood and Affect: Mood normal.        Behavior: Behavior normal.      UC Treatments / Results  Labs (all labs ordered are listed, but only abnormal results are displayed) Labs Reviewed  SARS CORONAVIRUS 2 BY RT PCR  GROUP A STREP BY PCR    EKG  Radiology DG Chest 2 View  Result Date: 07/17/2022 CLINICAL DATA:  Cough, chest congestion EXAM: CHEST - 2 VIEW COMPARISON:  06/18/2022 FINDINGS: Transverse diameter of heart is slightly increased. Metallic sutures are seen in the  sternum. There are no signs of pulmonary edema or focal pulmonary consolidation. There is no pleural effusion or pneumothorax. Surgical clips are noted in the left chest wall and right upper quadrant of abdomen. IMPRESSION: There are no signs of pulmonary edema or focal pulmonary consolidation. Electronically Signed   By: Ernie Avena M.D.   On: 07/17/2022 13:10    Procedures Procedures (including critical care time)  Medications Ordered in UC Medications - No data to display  Initial Impression / Assessment and Plan / UC Course  I have reviewed the triage vital signs and the nursing notes.  Pertinent labs & imaging results that were available during my care of the patient were reviewed by me and considered in my medical decision making (see chart for details).   88 year old male presents for cough, congestion, sore throat and fatigue that began yesterday.  Vitals are stable.  He is overall well-appearing.  No acute distress.  On exam he has nasal congestion and erythema posterior pharynx.  Few scattered rhonchi.  PCR strep and COVID testing obtained.  Both tests negative.  X-ray of chest obtained.  Negative.  Reviewed all results with patient.   Viral illness. Reviewed increasing rest and fluids.  Sent Promethazine DM and Atrovent nasal spray to pharmacy.  Reviewed returning for any fever, weakness or worsening symptoms.   Final Clinical Impressions(s) / UC Diagnoses   Final diagnoses:  Viral upper respiratory tract infection  Nasal congestion  Acute cough  Other fatigue     Discharge Instructions      -Negative strep and COVID.  Your x-ray does not show any evidence of pneumonia. You have a virus. - I sent cough medicine to the pharmacy.  You should be feeling better within about 7 to 10 days.  Increase rest and fluids. - If you develop a fever or have worsening symptoms then please return to our department for reexamination.     ED Prescriptions      Medication Sig Dispense Auth. Provider   promethazine-dextromethorphan (PROMETHAZINE-DM) 6.25-15 MG/5ML syrup Take 5 mLs by mouth 4 (four) times daily as needed. 118 mL Eusebio Friendly B, PA-C   ipratropium (ATROVENT) 0.06 % nasal spray Place 2 sprays into both nostrils 4 (four) times daily. 15 mL Shirlee Latch, PA-C      PDMP not reviewed this encounter.   Shirlee Latch, PA-C 07/17/22 1332

## 2022-07-17 NOTE — Discharge Instructions (Addendum)
-  Negative strep and COVID.  Your x-ray does not show any evidence of pneumonia. You have a virus. - I sent cough medicine to the pharmacy.  You should be feeling better within about 7 to 10 days.  Increase rest and fluids. - If you develop a fever or have worsening symptoms then please return to our department for reexamination.

## 2022-07-23 NOTE — Progress Notes (Unsigned)
    Keymani Glynn T. Yomara Toothman, MD, CAQ Sports Medicine [facility] at [facility] [address] [place], [address]  Phone: [phone]  FAX: [phone]  Aaron Stafford - 88 y.o. male  MRN [id number]  Date of Birth: 1934-12-05  Date: 07/24/2022  PCP: Eustaquio Boyden, MD  Referral: Eustaquio Boyden, MD  No chief complaint on file.  Subjective:   Aaron Stafford is a 88 y.o. very pleasant male patient with There is no height or weight on file to calculate BMI. who presents with the following:  Pleasant gentleman presents with some ongoing cough and congestion.    Review of Systems is noted in the HPI, as appropriate  Objective:   There were no vitals taken for this visit.  GEN: No acute distress; alert,appropriate. PULM: Breathing comfortably in no respiratory distress PSYCH: Normally interactive.   Laboratory and Imaging Data:  Assessment and Plan:   ***

## 2022-07-24 ENCOUNTER — Ambulatory Visit (INDEPENDENT_AMBULATORY_CARE_PROVIDER_SITE_OTHER): Payer: Medicare HMO | Admitting: Family Medicine

## 2022-07-24 ENCOUNTER — Encounter: Payer: Self-pay | Admitting: Family Medicine

## 2022-07-24 VITALS — BP 136/64 | HR 82 | Temp 98.2°F | Ht 66.5 in | Wt 176.4 lb

## 2022-07-24 DIAGNOSIS — J208 Acute bronchitis due to other specified organisms: Secondary | ICD-10-CM | POA: Diagnosis not present

## 2022-07-24 MED ORDER — DOXYCYCLINE HYCLATE 100 MG PO TABS
100.0000 mg | ORAL_TABLET | Freq: Two times a day (BID) | ORAL | 0 refills | Status: DC
Start: 2022-07-24 — End: 2022-09-03

## 2022-08-22 ENCOUNTER — Other Ambulatory Visit: Payer: Self-pay | Admitting: Family Medicine

## 2022-08-22 DIAGNOSIS — E1169 Type 2 diabetes mellitus with other specified complication: Secondary | ICD-10-CM

## 2022-08-25 ENCOUNTER — Other Ambulatory Visit: Payer: Self-pay | Admitting: Family Medicine

## 2022-08-25 DIAGNOSIS — E1169 Type 2 diabetes mellitus with other specified complication: Secondary | ICD-10-CM

## 2022-08-25 DIAGNOSIS — E1142 Type 2 diabetes mellitus with diabetic polyneuropathy: Secondary | ICD-10-CM

## 2022-08-25 DIAGNOSIS — E559 Vitamin D deficiency, unspecified: Secondary | ICD-10-CM

## 2022-08-27 ENCOUNTER — Other Ambulatory Visit: Payer: Medicare HMO

## 2022-09-03 ENCOUNTER — Ambulatory Visit (INDEPENDENT_AMBULATORY_CARE_PROVIDER_SITE_OTHER): Payer: Medicare HMO | Admitting: Family Medicine

## 2022-09-03 ENCOUNTER — Encounter: Payer: Self-pay | Admitting: Family Medicine

## 2022-09-03 VITALS — BP 134/70 | HR 60 | Temp 97.2°F | Ht 65.75 in | Wt 172.1 lb

## 2022-09-03 DIAGNOSIS — N3941 Urge incontinence: Secondary | ICD-10-CM

## 2022-09-03 DIAGNOSIS — E1142 Type 2 diabetes mellitus with diabetic polyneuropathy: Secondary | ICD-10-CM

## 2022-09-03 DIAGNOSIS — E559 Vitamin D deficiency, unspecified: Secondary | ICD-10-CM

## 2022-09-03 DIAGNOSIS — E663 Overweight: Secondary | ICD-10-CM

## 2022-09-03 DIAGNOSIS — G4700 Insomnia, unspecified: Secondary | ICD-10-CM

## 2022-09-03 DIAGNOSIS — Z7189 Other specified counseling: Secondary | ICD-10-CM

## 2022-09-03 DIAGNOSIS — N401 Enlarged prostate with lower urinary tract symptoms: Secondary | ICD-10-CM

## 2022-09-03 DIAGNOSIS — E785 Hyperlipidemia, unspecified: Secondary | ICD-10-CM | POA: Diagnosis not present

## 2022-09-03 DIAGNOSIS — I10 Essential (primary) hypertension: Secondary | ICD-10-CM

## 2022-09-03 DIAGNOSIS — Z951 Presence of aortocoronary bypass graft: Secondary | ICD-10-CM

## 2022-09-03 DIAGNOSIS — K5909 Other constipation: Secondary | ICD-10-CM

## 2022-09-03 DIAGNOSIS — E1140 Type 2 diabetes mellitus with diabetic neuropathy, unspecified: Secondary | ICD-10-CM

## 2022-09-03 DIAGNOSIS — E8889 Other specified metabolic disorders: Secondary | ICD-10-CM

## 2022-09-03 DIAGNOSIS — N138 Other obstructive and reflux uropathy: Secondary | ICD-10-CM

## 2022-09-03 DIAGNOSIS — I5032 Chronic diastolic (congestive) heart failure: Secondary | ICD-10-CM

## 2022-09-03 DIAGNOSIS — E1169 Type 2 diabetes mellitus with other specified complication: Secondary | ICD-10-CM | POA: Diagnosis not present

## 2022-09-03 DIAGNOSIS — Z0000 Encounter for general adult medical examination without abnormal findings: Secondary | ICD-10-CM

## 2022-09-03 DIAGNOSIS — I2510 Atherosclerotic heart disease of native coronary artery without angina pectoris: Secondary | ICD-10-CM

## 2022-09-03 DIAGNOSIS — Z8673 Personal history of transient ischemic attack (TIA), and cerebral infarction without residual deficits: Secondary | ICD-10-CM

## 2022-09-03 LAB — COMPREHENSIVE METABOLIC PANEL
ALT: 19 U/L (ref 0–53)
AST: 20 U/L (ref 0–37)
Albumin: 4.1 g/dL (ref 3.5–5.2)
Alkaline Phosphatase: 54 U/L (ref 39–117)
BUN: 19 mg/dL (ref 6–23)
CO2: 31 mEq/L (ref 19–32)
Calcium: 9.9 mg/dL (ref 8.4–10.5)
Chloride: 99 mEq/L (ref 96–112)
Creatinine, Ser: 1.04 mg/dL (ref 0.40–1.50)
GFR: 65.01 mL/min (ref >60.00)
Glucose, Bld: 139 mg/dL — ABNORMAL HIGH (ref 70–99)
Potassium: 4.3 mEq/L (ref 3.5–5.1)
Sodium: 135 mEq/L (ref 135–145)
Total Bilirubin: 0.8 mg/dL (ref 0.2–1.2)
Total Protein: 6.9 g/dL (ref 6.0–8.3)

## 2022-09-03 LAB — LIPID PANEL
Cholesterol: 121 mg/dL (ref 0–200)
HDL: 35.1 mg/dL — ABNORMAL LOW (ref >39.00)
LDL Cholesterol: 47 mg/dL (ref 0–99)
NonHDL: 85.63
Total CHOL/HDL Ratio: 3
Triglycerides: 195 mg/dL — ABNORMAL HIGH (ref 0.0–149.0)
VLDL: 39 mg/dL (ref 0.0–40.0)

## 2022-09-03 LAB — VITAMIN D 25 HYDROXY (VIT D DEFICIENCY, FRACTURES): VITD: 43.26 ng/mL (ref 30.00–100.00)

## 2022-09-03 LAB — HEMOGLOBIN A1C: Hgb A1c MFr Bld: 7 % — ABNORMAL HIGH (ref 4.6–6.5)

## 2022-09-03 LAB — MICROALBUMIN / CREATININE URINE RATIO
Creatinine,U: 101.5 mg/dL
Microalb Creat Ratio: 1.7 mg/g (ref 0.0–30.0)
Microalb, Ur: 1.8 mg/dL (ref 0.0–1.9)

## 2022-09-03 MED ORDER — GABAPENTIN 300 MG PO CAPS: ORAL_CAPSULE | ORAL | 4 refills | Status: DC

## 2022-09-03 MED ORDER — FUROSEMIDE 20 MG PO TABS: 20.0000 mg | ORAL_TABLET | Freq: Every day | ORAL | 4 refills | Status: DC

## 2022-09-03 MED ORDER — MIRABEGRON ER 25 MG PO TB24: 25.0000 mg | ORAL_TABLET | Freq: Every day | ORAL | 3 refills | Status: DC

## 2022-09-03 MED ORDER — ATORVASTATIN CALCIUM 40 MG PO TABS: 40.0000 mg | ORAL_TABLET | Freq: Every day | ORAL | 4 refills | Status: DC

## 2022-09-03 MED ORDER — METFORMIN HCL 500 MG PO TABS: 500.0000 mg | ORAL_TABLET | Freq: Two times a day (BID) | ORAL | 4 refills | Status: DC

## 2022-09-03 MED ORDER — LOSARTAN POTASSIUM 50 MG PO TABS: 50.0000 mg | ORAL_TABLET | Freq: Every day | ORAL | 4 refills | Status: DC

## 2022-09-03 MED ORDER — FINASTERIDE 5 MG PO TABS: 5.0000 mg | ORAL_TABLET | Freq: Every day | ORAL | 4 refills | Status: DC

## 2022-09-03 MED ORDER — ALPHA-LIPOIC ACID 600 MG PO CAPS: 600.0000 mg | ORAL_CAPSULE | Freq: Every day | ORAL | Status: AC

## 2022-09-03 MED ORDER — HYDROCHLOROTHIAZIDE 25 MG PO TABS: 25.0000 mg | ORAL_TABLET | Freq: Every day | ORAL | 4 refills | Status: DC

## 2022-09-03 MED ORDER — DULOXETINE HCL 30 MG PO CPEP: 30.0000 mg | ORAL_CAPSULE | Freq: Every day | ORAL | 4 refills | Status: DC

## 2022-09-03 MED ORDER — TRAZODONE HCL 50 MG PO TABS: 75.0000 mg | ORAL_TABLET | Freq: Every day | ORAL | 4 refills | Status: DC

## 2022-09-03 NOTE — Assessment & Plan Note (Signed)
Continue bowel regimen of colace, miralax, metamucil, senna prn

## 2022-09-03 NOTE — Assessment & Plan Note (Signed)
 Chronic, stable on current regimen - continue. 

## 2022-09-03 NOTE — Assessment & Plan Note (Signed)
Chronic, stable period on metformin - update A1c.

## 2022-09-03 NOTE — Assessment & Plan Note (Signed)
Congratulated on weight loss noted recently. He's cutting down on portion sizes.

## 2022-09-03 NOTE — Assessment & Plan Note (Addendum)
Will again notify neurology about rapid CYP2C19 metabolizer. Plavix effect increased. Change in therapy may be warranted.

## 2022-09-03 NOTE — Assessment & Plan Note (Signed)
 Previously discussed.

## 2022-09-03 NOTE — Assessment & Plan Note (Signed)
Appreciate cardiology care. Overdue for f/u, advised call and schedule appt.

## 2022-09-03 NOTE — Assessment & Plan Note (Signed)
Trial myrbetriq. Avoid antimuscarinics given age.

## 2022-09-03 NOTE — Assessment & Plan Note (Signed)
Continues finasteride.  

## 2022-09-03 NOTE — Patient Instructions (Addendum)
Labs today  Call neurology and cardiology to schedule follow up appointments.  Schedule eye doctor appointment.  Consider shingles and RSV shots through pharmacy.  Trial myrbetriq 25mg  daily for bladder.  Good to see you today Return as needed or in 6 months for diabetes follow up visit

## 2022-09-03 NOTE — Assessment & Plan Note (Signed)
Stable period on trazodone 75mg  nightly - continue.

## 2022-09-03 NOTE — Assessment & Plan Note (Signed)
 Preventative protocols reviewed and updated unless pt declined. Discussed healthy diet and lifestyle.  

## 2022-09-03 NOTE — Assessment & Plan Note (Signed)
Chronic, stable on atorvastatin 40mg  daily - update FLP.  The ASCVD Risk score (Arnett DK, et al., 2019) failed to calculate for the following reasons:   The 2019 ASCVD risk score is only valid for ages 67 to 47   The patient has a prior MI or stroke diagnosis

## 2022-09-03 NOTE — Assessment & Plan Note (Signed)
Continue ALA.  He states he's been taking gabapentin 300mg  BID with breakthrough neuropathy. Will increase to 300/600mg  daily.

## 2022-09-03 NOTE — Assessment & Plan Note (Signed)
Continues aspirin MWF and plavix daily. Followed by neurology. See below.

## 2022-09-03 NOTE — Assessment & Plan Note (Signed)
Update levels off replacement. 
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
## 2022-09-03 NOTE — Assessment & Plan Note (Addendum)

## 2022-10-22 DIAGNOSIS — H35373 Puckering of macula, bilateral: Secondary | ICD-10-CM | POA: Diagnosis not present

## 2022-10-22 DIAGNOSIS — Z0100 Encounter for examination of eyes and vision without abnormal findings: Secondary | ICD-10-CM | POA: Diagnosis not present

## 2022-10-22 DIAGNOSIS — H2513 Age-related nuclear cataract, bilateral: Secondary | ICD-10-CM | POA: Diagnosis not present

## 2022-10-22 DIAGNOSIS — H04123 Dry eye syndrome of bilateral lacrimal glands: Secondary | ICD-10-CM | POA: Diagnosis not present

## 2022-10-22 DIAGNOSIS — E119 Type 2 diabetes mellitus without complications: Secondary | ICD-10-CM | POA: Diagnosis not present

## 2022-10-22 LAB — HM DIABETES EYE EXAM

## 2022-10-23 ENCOUNTER — Encounter: Payer: Self-pay | Admitting: Family Medicine

## 2022-12-19 DIAGNOSIS — L218 Other seborrheic dermatitis: Secondary | ICD-10-CM | POA: Diagnosis not present

## 2022-12-19 DIAGNOSIS — L570 Actinic keratosis: Secondary | ICD-10-CM | POA: Diagnosis not present

## 2023-02-09 ENCOUNTER — Other Ambulatory Visit: Payer: Self-pay | Admitting: Cardiovascular Disease

## 2023-02-09 DIAGNOSIS — I200 Unstable angina: Secondary | ICD-10-CM

## 2023-02-27 NOTE — Progress Notes (Signed)
 Cardiology Office Note  Date:  02/28/2023   ID:  Aaron Stafford, DOB 04-29-1934, MRN [id number]  PCP:  Rilla Baller, MD   Chief Complaint  Patient presents with   12 month follow up     Patient c/o arrhythmia & chest tightness when in a hurry.    HPI:  Aaron Stafford is a very pleasant 89 year old gentleman with a history of  coronary artery disease,   bypass surgery, With LIMA graft to the LAD, vein graft to the RCA, vein graft to the OM  previous stent placed to the vein graft to the RCA in June 2011,  hyperlipidemia,  hypertension  Kidney stones EF 50 to 55% History of stroke April 2023 on DAPT who presents for routine follow up of his coronary artery disease, CVA.   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   Reports on heavy exertion he will get a Little tight in the chest  Feels this is stable, is not getting worse No shortness of breath on exertion, no lower extremity edema, no PND orthopnea  Denies tachycardia or palpitations concerning for arrhythmia Remains on aspirin  Plavix  in the setting of prior stroke No recent falls Active, lots of walking but no regular exercise program  Lab work reviewed A1c 7.0 Total cholesterol 121 LDL 47  EKG personally reviewed by myself on todays visit EKG Interpretation Date/Time:  Friday February 28 2023 10:48:35 EST Ventricular Rate:  60 PR Interval:  226 QRS Duration:  148 QT Interval:  462 QTC Calculation: 462 R Axis:   -63  Text Interpretation: Sinus rhythm with 1st degree A-V block Left axis deviation Right bundle branch block Inferior infarct (cited on or before 27-Jun-2021) When compared with ECG of 23-May-2022 15:56, Premature supraventricular complexes are no longer Present Criteria for Anterior infarct are no longer Present Confirmed by Perla Lye ([id number]) on 02/28/2023 11:13:29 AM   admission to the hospital for stroke April 2023 difficulty speaking 06/12/21.  fell accidentally when  he was moving furniture at home at about 9 PM night before Developed neurologic symptoms next morning 6 AM, weakness both legs Evaluated by neurology  Hospital imaging reviewed MRI/MRA  1. Small acute infarct left cerebellar tonsil. The left vertebral artery is occluded at the origin. There is a severe stenosis in the left V4 segment. Left PICA not visualized. 2. Mild stenosis proximal and distal right vertebral artery.  But 3. Both carotid arteries are patent to the skull base without significant stenosis. 4. Atrophy and mild to moderate chronic microvascular ischemia.   2D echo showed EF of 50-55% with grade 2 diastolic dysfunction, also showed some aortic valve abnormalities.    PMH:   has a past medical history of CAD (coronary artery disease), Chronic rhinitis, COVID-19 virus infection (09/2019), Diabetes mellitus type II, Diastolic dysfunction, Diverticulosis (06/2011), Fatty liver (06/2011), GERD (gastroesophageal reflux disease), History of BPH, History of shingles, HLD (hyperlipidemia), HTN (hypertension), Ischemic heart disease, Melanoma (HCC) (12/2015), MI (myocardial infarction) (HCC) (1995), Shingles rash (09/26/2012), and Sialadenitis (05/01/2017).  PSH:    Past Surgical History:  Procedure Laterality Date   ANKLE SURGERY  03/2002   Left ankle fracture   CARDIAC CATHETERIZATION     CHOLECYSTECTOMY  1985   COLONOSCOPY     diverticulosis, tortuous colon with looping [name])   CORONARY ARTERY BYPASS GRAFT  05/1993   x6 MI Attemped angioplasty   CORONARY STENT PLACEMENT  07/25/2009   Multi-Link VISION Cobalt chromium  CYSTOSCOPY  10/03/08   Normal-Dr. Twylla   INGUINAL HERNIA REPAIR  11/2000   and ventral hernia repair    Current Outpatient Medications  Medication Sig Dispense Refill   Accu-Chek Softclix Lancets lancets Use as instructed to check sugars once daily E11.42 100 each 3   Alpha-Lipoic Acid 600 MG CAPS Take 1 capsule (600 mg total) by mouth daily.      aspirin  EC 81 MG tablet Take 1 tablet (81 mg total) by mouth every Monday, Wednesday, and Friday. Swallow whole.     atorvastatin  (LIPITOR) 40 MG tablet Take 1 tablet (40 mg total) by mouth daily. 90 tablet 4   Blood Glucose Monitoring Suppl (ACCU-CHEK AVIVA PLUS) w/Device KIT Use as directed to check sugars daily. E11.42 1 kit 0   clopidogrel  (PLAVIX ) 75 MG tablet Take 1 tablet (75 mg total) by mouth daily.     Cranberry 500 MG CAPS Take 2 capsules (1,000 mg total) by mouth daily.     DULoxetine  (CYMBALTA ) 30 MG capsule Take 1 capsule (30 mg total) by mouth daily. 90 capsule 4   finasteride  (PROSCAR ) 5 MG tablet Take 1 tablet (5 mg total) by mouth daily. 90 tablet 4   fluticasone  (FLONASE ) 50 MCG/ACT nasal spray SPRAY 2 SPRAYS INTO EACH NOSTRIL EVERY DAY 48 mL 3   furosemide  (LASIX ) 20 MG tablet Take 1 tablet (20 mg total) by mouth daily. 90 tablet 4   gabapentin  (NEURONTIN ) 300 MG capsule Take 1 capsule (300 mg total) by mouth in the morning AND 2 capsules (600 mg total) at bedtime. 270 capsule 4   glucose blood (ACCU-CHEK AVIVA PLUS) test strip Use as instructed to check sugars daily E11.42 100 each 3   hydrochlorothiazide  (HYDRODIURIL ) 25 MG tablet Take 1 tablet (25 mg total) by mouth daily. 90 tablet 4   ipratropium (ATROVENT ) 0.06 % nasal spray Place 2 sprays into both nostrils 4 (four) times daily. 15 mL 0   ketoconazole (NIZORAL) 2 % cream Apply topically.     losartan  (COZAAR ) 50 MG tablet Take 1 tablet (50 mg total) by mouth daily. 90 tablet 4   metFORMIN  (GLUCOPHAGE ) 500 MG tablet Take 1 tablet (500 mg total) by mouth 2 (two) times daily with a meal. 180 tablet 4   mirabegron  ER (MYRBETRIQ ) 25 MG TB24 tablet Take 1 tablet (25 mg total) by mouth daily. 30 tablet 3   polyethylene glycol powder (GLYCOLAX /MIRALAX ) 17 GM/SCOOP powder TAKE 8.5-17 G BY MOUTH DAILY AS NEEDED FOR MODERATE CONSTIPATION. 510 g 1   traZODone  (DESYREL ) 50 MG tablet Take 1.5 tablets (75 mg total) by mouth at bedtime.  135 tablet 4   isosorbide  mononitrate (IMDUR ) 30 MG 24 hr tablet Take 1 tablet (30 mg total) by mouth daily. 90 tablet 3   No current facility-administered medications for this visit.    Allergies:   Morphine sulfate, Nitroglycerin, and Contrast media [iodinated contrast media]   Social History:  The patient  reports that he has never smoked. He has never used smokeless tobacco. He reports that he does not drink alcohol and does not use drugs.   Family History:   family history includes Alcohol abuse in his brother; Coronary artery disease in his mother; Heart attack in his brother; Heart failure in his mother; Hypertension in his brother, brother, and sister; Kidney failure in his mother.    Review of Systems: Review of Systems  Constitutional: Negative.   HENT: Negative.    Respiratory: Negative.    Cardiovascular: Negative.  Gastrointestinal: Negative.   Musculoskeletal: Negative.   Neurological: Negative.   Psychiatric/Behavioral: Negative.    All other systems reviewed and are negative.   PHYSICAL EXAM: VS:  BP 138/60 (BP Location: Left Arm, Patient Position: Sitting, Cuff Size: Normal)   Pulse 60   Ht 5' 7.5 (1.715 m)   Wt 180 lb (81.6 kg)   SpO2 96%   BMI 27.78 kg/m  , BMI Body mass index is 27.78 kg/m. Constitutional:  oriented to person, place, and time. No distress.  HENT:  Head: Grossly normal Eyes:  no discharge. No scleral icterus.  Neck: No JVD, no carotid bruits  Cardiovascular: Regular rate and rhythm, no murmurs appreciated Pulmonary/Chest: Clear to auscultation bilaterally, no wheezes or rails Abdominal: Soft.  no distension.  no tenderness.  Musculoskeletal: Normal range of motion Neurological:  normal muscle tone. Coordination normal. No atrophy Skin: Skin warm and dry Psychiatric: normal affect, pleasant  Recent Labs: 05/23/2022: Hemoglobin 13.9; Platelets 214 09/03/2022: ALT 19; BUN 19; Creatinine, Ser 1.04; Potassium 4.3; Sodium 135    Lipid  Panel Lab Results  Component Value Date   CHOL 121 09/03/2022   HDL 35.10 (L) 09/03/2022   LDLCALC 47 09/03/2022   TRIG 195.0 (H) 09/03/2022    Wt Readings from Last 3 Encounters:  02/28/23 180 lb (81.6 kg)  09/03/22 172 lb 2 oz (78.1 kg)  07/24/22 176 lb 6 oz (80 kg)     ASSESSMENT AND PLAN:  History of stroke Reported 2 strokes, back to back April 2023,  presumably secondary to cerebrovascular disease Previously evaluated by neurology, on aspirin  Plavix  Cholesterol at goal  Atherosclerosis of native coronary artery of native heart with stable angina pectoris (HCC) -  Recommend he increase isosorbide  up to 30 mg from 15 mg Further up titration can be performed as blood pressure tolerates For worsening symptoms, may need ischemic workup  Type 2 diabetes, controlled, with peripheral neuropathy (HCC)  Hemoglobin A1c 7.0, calorie restriction recommended  S/P CABG (coronary artery bypass graft) - Plan: EKG 12-Lead Doing well since surgery 2011 Chronic stable angina, discussed higher dose isosorbide   recommended he let us  know if he has worsening chest tightness on exertion Ischemic workup might be indicated for worsening symptoms  Mixed hyperlipidemia Cholesterol is at goal on the current lipid regimen. No changes to the medications were made.  Essential hypertension Increase isosorbide  up to 30 mg daily as above    Orders Placed This Encounter  Procedures   EKG 12-Lead     Signed, Velinda Lunger, M.D., Ph.D. 02/28/2023  [facility] [place], [place] [phone]

## 2023-02-28 ENCOUNTER — Ambulatory Visit: Payer: Medicare (Managed Care) | Attending: Cardiovascular Disease | Admitting: Cardiovascular Disease

## 2023-02-28 VITALS — BP 138/60 | HR 60 | Ht 67.5 in | Wt 180.0 lb

## 2023-02-28 DIAGNOSIS — G459 Transient cerebral ischemic attack, unspecified: Secondary | ICD-10-CM

## 2023-02-28 DIAGNOSIS — I200 Unstable angina: Secondary | ICD-10-CM

## 2023-02-28 DIAGNOSIS — Z951 Presence of aortocoronary bypass graft: Secondary | ICD-10-CM

## 2023-02-28 DIAGNOSIS — E785 Hyperlipidemia, unspecified: Secondary | ICD-10-CM

## 2023-02-28 DIAGNOSIS — I639 Cerebral infarction, unspecified: Secondary | ICD-10-CM

## 2023-02-28 DIAGNOSIS — I25118 Atherosclerotic heart disease of native coronary artery with other forms of angina pectoris: Secondary | ICD-10-CM | POA: Diagnosis not present

## 2023-02-28 DIAGNOSIS — I10 Essential (primary) hypertension: Secondary | ICD-10-CM

## 2023-02-28 DIAGNOSIS — E1140 Type 2 diabetes mellitus with diabetic neuropathy, unspecified: Secondary | ICD-10-CM | POA: Diagnosis not present

## 2023-02-28 DIAGNOSIS — I5032 Chronic diastolic (congestive) heart failure: Secondary | ICD-10-CM

## 2023-02-28 MED ORDER — ISOSORBIDE MONONITRATE ER 30 MG PO TB24: 30.0000 mg | ORAL_TABLET | Freq: Every day | ORAL | 3 refills | Status: DC

## 2023-02-28 NOTE — Patient Instructions (Signed)
 Medication Instructions:  Please increase the isosorbide  up to 30 mg daily  If you need a refill on your cardiac medications before your next appointment, please call your pharmacy.   Lab work: No new labs needed  Testing/Procedures: No new testing needed  Follow-Up: At [facility], you and your health needs are our priority.  As part of our continuing mission to provide you with exceptional heart care, we have created designated Provider Care Teams.  These Care Teams include your primary Cardiologist (physician) and Advanced Practice Providers (APPs -  Physician Assistants and Nurse Practitioners) who all work together to provide you with the care you need, when you need it.  You will need a follow up appointment in 12 months  Providers on your designated Care Team:   Lonni Meager, NP Bernardino Bring, PA-C Cadence [place], [place]  COVID-19 Vaccine Information can be found at: [email or web address] For questions related to vaccine distribution or appointments, please email [email or web address] or call [phone].

## 2023-03-07 ENCOUNTER — Encounter: Payer: Self-pay | Admitting: Family Medicine

## 2023-03-07 ENCOUNTER — Telehealth: Payer: Self-pay

## 2023-03-07 ENCOUNTER — Ambulatory Visit (INDEPENDENT_AMBULATORY_CARE_PROVIDER_SITE_OTHER): Payer: Medicare (Managed Care) | Admitting: Family Medicine

## 2023-03-07 VITALS — BP 132/78 | HR 56 | Temp 97.6°F | Ht 67.5 in | Wt 180.1 lb

## 2023-03-07 DIAGNOSIS — N138 Other obstructive and reflux uropathy: Secondary | ICD-10-CM

## 2023-03-07 DIAGNOSIS — Z7984 Long term (current) use of oral hypoglycemic drugs: Secondary | ICD-10-CM

## 2023-03-07 DIAGNOSIS — G4700 Insomnia, unspecified: Secondary | ICD-10-CM

## 2023-03-07 DIAGNOSIS — Z8673 Personal history of transient ischemic attack (TIA), and cerebral infarction without residual deficits: Secondary | ICD-10-CM

## 2023-03-07 DIAGNOSIS — Z23 Encounter for immunization: Secondary | ICD-10-CM

## 2023-03-07 DIAGNOSIS — I10 Essential (primary) hypertension: Secondary | ICD-10-CM

## 2023-03-07 DIAGNOSIS — E1140 Type 2 diabetes mellitus with diabetic neuropathy, unspecified: Secondary | ICD-10-CM

## 2023-03-07 DIAGNOSIS — E1169 Type 2 diabetes mellitus with other specified complication: Secondary | ICD-10-CM

## 2023-03-07 DIAGNOSIS — Z951 Presence of aortocoronary bypass graft: Secondary | ICD-10-CM

## 2023-03-07 DIAGNOSIS — E8889 Other specified metabolic disorders: Secondary | ICD-10-CM

## 2023-03-07 DIAGNOSIS — I2510 Atherosclerotic heart disease of native coronary artery without angina pectoris: Secondary | ICD-10-CM

## 2023-03-07 DIAGNOSIS — C4442 Squamous cell carcinoma of skin of scalp and neck: Secondary | ICD-10-CM | POA: Insufficient documentation

## 2023-03-07 LAB — POCT GLYCOSYLATED HEMOGLOBIN (HGB A1C): Hemoglobin A1C: 7.1 % — AB (ref 4.0–5.6)

## 2023-03-07 NOTE — Assessment & Plan Note (Addendum)
Continue aspirin MWF, plavix.

## 2023-03-07 NOTE — Progress Notes (Signed)
Ph: [phone] Fax: [phone]   Patient ID: Aaron Stafford, male    DOB: 03-31-1934, 89 y.o.   MRN: [id number]  This visit was conducted in person.  BP 132/78   Pulse (!) 56   Temp 97.6 F (36.4 C) (Oral)   Ht 5' 7.5" (1.715 m)   Wt 180 lb 2 oz (81.7 kg)   SpO2 97%   BMI 27.80 kg/m    CC: 6 mo f/u visit  Subjective:   HPI: Aaron Stafford is a 89 y.o. male presenting on 03/07/2023 for Medical Management of Chronic Issues (Here for 6 mo DM f/u.)   Some family stressors.  Upcoming 55 yr anniversary at his church this coming June 2025.   H/o cerebellar stroke 05/2021, 06/2021 - continues plavix and aspirin. WNU2V25 rapid metabolizer - possibly increased plavix effect hence bleeding risk.   Dealing with squamous cell cancer to scalp followed by Dr Ebony Cargo. Will need to return at end of month for re-excision.   Saw Dr Mariah Milling last week, isosorbide increased to 30mg  daily for symptoms of chronic stable angina.   DM - does not regularly check sugars. Compliant with antihyperglycemic regimen which includes: metformin 500mg  BID. Denies low sugars or hypoglycemic symptoms. Denies paresthesias, blurry vision. Neuropathy well controlled with compression stockings and gabapentin 600mg  daily. Last diabetic eye exam 10/2022. Glucometer brand: accucheck aviva plus - may need new machine. Last foot exam: 02/2022 - DUE. DSME: has not seen, declines. Lab Results  Component Value Date   HGBA1C 7.1 (A) 03/07/2023   Diabetic Foot Exam - Simple   Simple Foot Form Visual Inspection No deformities, no ulcerations, no other skin breakdown bilaterally: Yes Sensation Testing Intact to touch and monofilament testing bilaterally: Yes Pulse Check Posterior Tibialis and Dorsalis pulse intact bilaterally: Yes Comments No claudication    Lab Results  Component Value Date   MICROALBUR 1.8 09/03/2022         Relevant past medical, surgical, family and social history reviewed and  updated as indicated. Interim medical history since our last visit reviewed. Allergies and medications reviewed and updated. Outpatient Medications Prior to Visit  Medication Sig Dispense Refill   Accu-Chek Softclix Lancets lancets Use as instructed to check sugars once daily E11.42 100 each 3   Alpha-Lipoic Acid 600 MG CAPS Take 1 capsule (600 mg total) by mouth daily.     aspirin EC 81 MG tablet Take 1 tablet (81 mg total) by mouth every Monday, Wednesday, and Friday. Swallow whole.     atorvastatin (LIPITOR) 40 MG tablet Take 1 tablet (40 mg total) by mouth daily. 90 tablet 4   Blood Glucose Monitoring Suppl (ACCU-CHEK AVIVA PLUS) w/Device KIT Use as directed to check sugars daily. E11.42 1 kit 0   clopidogrel (PLAVIX) 75 MG tablet Take 1 tablet (75 mg total) by mouth daily.     Cranberry 500 MG CAPS Take 2 capsules (1,000 mg total) by mouth daily.     DULoxetine (CYMBALTA) 30 MG capsule Take 1 capsule (30 mg total) by mouth daily. 90 capsule 4   finasteride (PROSCAR) 5 MG tablet Take 1 tablet (5 mg total) by mouth daily. 90 tablet 4   fluticasone (FLONASE) 50 MCG/ACT nasal spray SPRAY 2 SPRAYS INTO EACH NOSTRIL EVERY DAY 48 mL 3   furosemide (LASIX) 20 MG tablet Take 1 tablet (20 mg total) by mouth daily. 90 tablet 4   glucose blood (ACCU-CHEK AVIVA PLUS) test strip Use as instructed to check sugars  daily E11.42 100 each 3   hydrochlorothiazide (HYDRODIURIL) 25 MG tablet Take 1 tablet (25 mg total) by mouth daily. 90 tablet 4   ipratropium (ATROVENT) 0.06 % nasal spray Place 2 sprays into both nostrils 4 (four) times daily. 15 mL 0   isosorbide mononitrate (IMDUR) 30 MG 24 hr tablet Take 1 tablet (30 mg total) by mouth daily. 90 tablet 3   ketoconazole (NIZORAL) 2 % cream Apply topically.     losartan (COZAAR) 50 MG tablet Take 1 tablet (50 mg total) by mouth daily. 90 tablet 4   metFORMIN (GLUCOPHAGE) 500 MG tablet Take 1 tablet (500 mg total) by mouth 2 (two) times daily with a meal. 180  tablet 4   mirabegron ER (MYRBETRIQ) 25 MG TB24 tablet Take 1 tablet (25 mg total) by mouth daily. 30 tablet 3   polyethylene glycol powder (GLYCOLAX/MIRALAX) 17 GM/SCOOP powder TAKE 8.5-17 G BY MOUTH DAILY AS NEEDED FOR MODERATE CONSTIPATION. 510 g 1   traZODone (DESYREL) 50 MG tablet Take 1.5 tablets (75 mg total) by mouth at bedtime. 135 tablet 4   gabapentin (NEURONTIN) 300 MG capsule Take 1 capsule (300 mg total) by mouth in the morning AND 2 capsules (600 mg total) at bedtime. 270 capsule 4   gabapentin (NEURONTIN) 300 MG capsule Take 2 capsules (600 mg total) by mouth at bedtime.     No facility-administered medications prior to visit.     Per HPI unless specifically indicated in ROS section below Review of Systems  Objective:  BP 132/78   Pulse (!) 56   Temp 97.6 F (36.4 C) (Oral)   Ht 5' 7.5" (1.715 m)   Wt 180 lb 2 oz (81.7 kg)   SpO2 97%   BMI 27.80 kg/m   Wt Readings from Last 3 Encounters:  03/07/23 180 lb 2 oz (81.7 kg)  02/28/23 180 lb (81.6 kg)  09/03/22 172 lb 2 oz (78.1 kg)      Physical Exam Vitals and nursing note reviewed.  Constitutional:      Appearance: Normal appearance. He is not ill-appearing.  HENT:     Head: Normocephalic and atraumatic.     Mouth/Throat:     Mouth: Mucous membranes are moist.     Pharynx: Oropharynx is clear. No oropharyngeal exudate or posterior oropharyngeal erythema.  Eyes:     Extraocular Movements: Extraocular movements intact.     Conjunctiva/sclera: Conjunctivae normal.     Pupils: Pupils are equal, round, and reactive to light.  Cardiovascular:     Rate and Rhythm: Normal rate and regular rhythm.     Pulses: Normal pulses.     Heart sounds: Normal heart sounds. No murmur heard. Pulmonary:     Effort: Pulmonary effort is normal. No respiratory distress.     Breath sounds: Normal breath sounds. No wheezing, rhonchi or rales.  Musculoskeletal:     Right lower leg: No edema.     Left lower leg: No edema.      Comments: See HPI for foot exam if done  Skin:    General: Skin is warm and dry.     Findings: No rash.  Neurological:     Mental Status: He is alert.  Psychiatric:        Mood and Affect: Mood normal.        Behavior: Behavior normal.       Results for orders placed or performed in visit on 03/07/23  POCT glycosylated hemoglobin (Hb A1C)   Collection Time:  03/07/23 10:39 AM  Result Value Ref Range   Hemoglobin A1C 7.1 (A) 4.0 - 5.6 %   HbA1c POC (<> result, manual entry)     HbA1c, POC (prediabetic range)     HbA1c, POC (controlled diabetic range)      Assessment & Plan:   Problem List Items Addressed This Visit     CAD (coronary artery disease)   Appreciate cardiology care.       Type 2 diabetes mellitus with diabetic neuropathy, unspecified (HCC) - Primary   Chronic, adequate on metformin 500mg  BID - continue this.  Pt agrees to diabetes education classes referral at [facility] - he will go with wife.       Relevant Orders   POCT glycosylated hemoglobin (Hb A1C) (Completed)   Ambulatory referral to diabetic education   S/P CABG (coronary artery bypass graft)   History of cerebellar stroke   Continue aspirin MWF, plavix.       CYP2C19 rapid metabolizer (HCC)   Squamous cell cancer of scalp and skin of neck   To vertex of scalp, followed by dermatology with pending re-excision        No orders of the defined types were placed in this encounter.   Orders Placed This Encounter  Procedures   Ambulatory referral to diabetic education    Referral Priority:   Routine    Referral Type:   Consultation    Referral Reason:   Specialty Services Required    Number of Visits Requested:   1   POCT glycosylated hemoglobin (Hb A1C)    Patient Instructions  Let me know if you need a new prescription for glucometer and which brand to send.  Sugar remains ok but in controlled diabetes range (goal A1c <8%, yours was 7.1%).  Work on low sugar low carb diabetic diet -  handout provided today  We will refer you to diabetes classes at [facility].  Continue metformin 500mg  twice daily.  Return in 6 months for physical /wellness visit   Follow up plan: Return in about 6 months (around 09/04/2023), or if symptoms worsen or fail to improve, for annual exam, prior fasting for blood work.  Eustaquio Boyden, MD

## 2023-03-07 NOTE — Assessment & Plan Note (Signed)
Appreciate cardiology care.  °

## 2023-03-07 NOTE — Assessment & Plan Note (Addendum)
Chronic, adequate on metformin 500mg  BID - continue this.  Pt agrees to diabetes education classes referral at [facility] - he will go with wife.

## 2023-03-07 NOTE — Assessment & Plan Note (Signed)
To vertex of scalp, followed by dermatology with pending re-excision

## 2023-03-07 NOTE — Patient Instructions (Addendum)
Let me know if you need a new prescription for glucometer and which brand to send.  Sugar remains ok but in controlled diabetes range (goal A1c <8%, yours was 7.1%).  Work on low sugar low carb diabetic diet - handout provided today  We will refer you to diabetes classes at [facility].  Continue metformin 500mg  twice daily.  Return in 6 months for physical /wellness visit

## 2023-03-07 NOTE — Telephone Encounter (Signed)
Copied from CRM [id number]. Topic: Clinical - Medication Question >> Mar 07, 2023  2:34 PM Aaron Stafford wrote: Reason for CRM: Patient is needing current list of medications for him and his wife Mrs. [name], DOB 01/26/1939 sent to [facility] in [place] on [address] because [facility] system is down and they are unable to send the list of their meds over. Patient is also wanting to ask Dr. Patrice Paradise if he can get a blood glucose monitor that goes on his arm because he is a diabetic.

## 2023-03-07 NOTE — Assessment & Plan Note (Signed)
Chronic, stable 

## 2023-03-11 ENCOUNTER — Ambulatory Visit: Payer: Medicare HMO | Admitting: Cardiovascular Disease

## 2023-03-11 ENCOUNTER — Encounter: Payer: Self-pay | Admitting: Family Medicine

## 2023-03-11 ENCOUNTER — Ambulatory Visit (INDEPENDENT_AMBULATORY_CARE_PROVIDER_SITE_OTHER): Payer: Medicare (Managed Care) | Admitting: Family Medicine

## 2023-03-11 VITALS — BP 106/59 | HR 86 | Temp 98.1°F | Ht 67.5 in | Wt 174.0 lb

## 2023-03-11 DIAGNOSIS — U071 COVID-19: Secondary | ICD-10-CM

## 2023-03-11 DIAGNOSIS — R051 Acute cough: Secondary | ICD-10-CM | POA: Diagnosis not present

## 2023-03-11 HISTORY — DX: COVID-19: U07.1

## 2023-03-11 LAB — POC COVID19 BINAXNOW: SARS Coronavirus 2 Ag: POSITIVE — AB

## 2023-03-11 LAB — POC INFLUENZA A&B (BINAX/QUICKVUE)
Influenza A, POC: NEGATIVE
Influenza B, POC: NEGATIVE

## 2023-03-11 MED ORDER — BENZONATATE 200 MG PO CAPS: 200.0000 mg | ORAL_CAPSULE | Freq: Two times a day (BID) | ORAL | 0 refills | Status: DC | PRN

## 2023-03-11 MED ORDER — MOLNUPIRAVIR EUA 200MG CAPSULE: 4.0000 | ORAL_CAPSULE | Freq: Two times a day (BID) | ORAL | 0 refills | Status: DC

## 2023-03-11 NOTE — Patient Instructions (Addendum)
Push fluids, rest.  Complete course of  molnupiravir.. call  if not covered by insurance.  Can use benzonatate for cough as needed.  Use tylenol 650 ES 2 capsules every three hours. for fever, headache and sore throat.  Go to ER for severe shortness of breath.

## 2023-03-11 NOTE — Progress Notes (Signed)
Patient ID: Aaron Stafford, male    DOB: June 01, 1934, 89 y.o.   MRN: [id number]  This visit was conducted in person.  BP (!) 106/59 (BP Location: Left Arm, Patient Position: Sitting, Cuff Size: Normal)   Pulse 86   Temp 98.1 F (36.7 C) (Temporal)   Ht 5' 7.5" (1.715 m)   Wt 174 lb (78.9 kg)   SpO2 97%   BMI 26.85 kg/m    CC:  Chief Complaint  Patient presents with   Nasal Congestion    Symptoms started on Sunday   Hoarse   Sinus Drainage    Subjective:   HPI: Aaron Stafford is a 89 y.o. male  patient of Dr. Timoteo Expose with history of  chronic, diastolic heart failure, type 2 diabetes, HTN presenting on 03/11/2023 for Nasal Congestion (Symptoms started on Sunday), Hoarse, and Sinus Drainage   Date of onset: 3 days Initial symptoms included  hoarse voice, post nasal drip, ST Symptoms progressed to weakness, coughing productive  Some ear pain and face pain.  No fever   No SOB, no wheeze.   Sick contacts:  works as a Museum/gallery curator:   none     He has tried to treat with  home remedy for cough.. moonshine... this helped a lot last night.     No history of chronic lung disease such as asthma or COPD.  History of CVS an CAD Non-smoker.  Wt Readings from Last 3 Encounters:  03/11/23 174 lb (78.9 kg)  03/07/23 180 lb 2 oz (81.7 kg)  02/28/23 180 lb (81.6 kg)   BP Readings from Last 3 Encounters:  03/11/23 (!) 106/59  03/07/23 132/78  02/28/23 138/60   GFR 65 08/2022     Interaction with clopidogrel and paxlovid.. moderate... will try to see if molnupiravir available and covered... if not reconsider paxlovid as option given high risk individual.  Relevant past medical, surgical, family and social history reviewed and updated as indicated. Interim medical history since our last visit reviewed. Allergies and medications reviewed and updated. Outpatient Medications Prior to Visit  Medication Sig Dispense Refill   Accu-Chek Softclix Lancets lancets Use as instructed  to check sugars once daily E11.42 100 each 3   Alpha-Lipoic Acid 600 MG CAPS Take 1 capsule (600 mg total) by mouth daily.     aspirin EC 81 MG tablet Take 1 tablet (81 mg total) by mouth every Monday, Wednesday, and Friday. Swallow whole.     atorvastatin (LIPITOR) 40 MG tablet Take 1 tablet (40 mg total) by mouth daily. 90 tablet 4   Blood Glucose Monitoring Suppl (ACCU-CHEK AVIVA PLUS) w/Device KIT Use as directed to check sugars daily. E11.42 1 kit 0   clopidogrel (PLAVIX) 75 MG tablet Take 1 tablet (75 mg total) by mouth daily.     Cranberry 500 MG CAPS Take 2 capsules (1,000 mg total) by mouth daily.     DULoxetine (CYMBALTA) 30 MG capsule Take 1 capsule (30 mg total) by mouth daily. 90 capsule 4   finasteride (PROSCAR) 5 MG tablet Take 1 tablet (5 mg total) by mouth daily. 90 tablet 4   fluticasone (FLONASE) 50 MCG/ACT nasal spray SPRAY 2 SPRAYS INTO EACH NOSTRIL EVERY DAY 48 mL 3   furosemide (LASIX) 20 MG tablet Take 1 tablet (20 mg total) by mouth daily. 90 tablet 4   gabapentin (NEURONTIN) 300 MG capsule Take 2 capsules (600 mg total) by mouth at bedtime.     glucose blood (  ACCU-CHEK AVIVA PLUS) test strip Use as instructed to check sugars daily E11.42 100 each 3   hydrochlorothiazide (HYDRODIURIL) 25 MG tablet Take 1 tablet (25 mg total) by mouth daily. 90 tablet 4   hydrocortisone 2.5 % cream Apply 1 Application topically 2 (two) times daily.     ipratropium (ATROVENT) 0.06 % nasal spray Place 2 sprays into both nostrils 4 (four) times daily. 15 mL 0   isosorbide mononitrate (IMDUR) 30 MG 24 hr tablet Take 1 tablet (30 mg total) by mouth daily. 90 tablet 3   ketoconazole (NIZORAL) 2 % cream Apply topically.     losartan (COZAAR) 50 MG tablet Take 1 tablet (50 mg total) by mouth daily. 90 tablet 4   metFORMIN (GLUCOPHAGE) 500 MG tablet Take 1 tablet (500 mg total) by mouth 2 (two) times daily with a meal. 180 tablet 4   mirabegron ER (MYRBETRIQ) 25 MG TB24 tablet Take 1 tablet (25 mg  total) by mouth daily. 30 tablet 3   mupirocin ointment (BACTROBAN) 2 % Apply 1 Application topically 2 (two) times daily.     polyethylene glycol powder (GLYCOLAX/MIRALAX) 17 GM/SCOOP powder TAKE 8.5-17 G BY MOUTH DAILY AS NEEDED FOR MODERATE CONSTIPATION. 510 g 1   traZODone (DESYREL) 50 MG tablet Take 1.5 tablets (75 mg total) by mouth at bedtime. 135 tablet 4   No facility-administered medications prior to visit.     Per HPI unless specifically indicated in ROS section below Review of Systems  Constitutional:  Positive for fatigue. Negative for fever.  HENT:  Positive for postnasal drip and sore throat. Negative for ear pain.   Eyes:  Negative for pain.  Respiratory:  Positive for cough. Negative for shortness of breath.   Cardiovascular:  Negative for chest pain, palpitations and leg swelling.  Gastrointestinal:  Negative for abdominal pain.  Genitourinary:  Negative for dysuria.  Musculoskeletal:  Negative for arthralgias.  Neurological:  Negative for syncope, light-headedness and headaches.  Psychiatric/Behavioral:  Negative for dysphoric mood.    Objective:  BP (!) 106/59 (BP Location: Left Arm, Patient Position: Sitting, Cuff Size: Normal)   Pulse 86   Temp 98.1 F (36.7 C) (Temporal)   Ht 5' 7.5" (1.715 m)   Wt 174 lb (78.9 kg)   SpO2 97%   BMI 26.85 kg/m   Wt Readings from Last 3 Encounters:  03/11/23 174 lb (78.9 kg)  03/07/23 180 lb 2 oz (81.7 kg)  02/28/23 180 lb (81.6 kg)      Physical Exam Constitutional:      General: He is not in acute distress.    Appearance: Normal appearance. He is well-developed. He is ill-appearing. He is not toxic-appearing.  HENT:     Head: Normocephalic and atraumatic.     Comments: Hoarse voice    Right Ear: Hearing, tympanic membrane, ear canal and external ear normal. No tenderness. No foreign body. Tympanic membrane is not retracted or bulging.     Left Ear: Hearing, tympanic membrane, ear canal and external ear normal. No  tenderness. No foreign body. Tympanic membrane is not retracted or bulging.     Nose: Nose normal. No mucosal edema or rhinorrhea.     Right Sinus: No maxillary sinus tenderness or frontal sinus tenderness.     Left Sinus: No maxillary sinus tenderness or frontal sinus tenderness.     Mouth/Throat:     Dentition: Normal dentition. No dental caries.     Pharynx: Uvula midline. No oropharyngeal exudate.  Tonsils: No tonsillar abscesses.  Eyes:     General: Lids are normal. Lids are everted, no foreign bodies appreciated.     Conjunctiva/sclera: Conjunctivae normal.     Pupils: Pupils are equal, round, and reactive to light.  Neck:     Thyroid: No thyroid mass or thyromegaly.     Vascular: No carotid bruit.     Trachea: Trachea and phonation normal.  Cardiovascular:     Rate and Rhythm: Normal rate and regular rhythm.     Pulses: Normal pulses.     Heart sounds: Normal heart sounds, S1 normal and S2 normal. No murmur heard.    No gallop.  Pulmonary:     Effort: Pulmonary effort is normal. No respiratory distress.     Breath sounds: Normal breath sounds. No wheezing, rhonchi or rales.  Abdominal:     General: Bowel sounds are normal.     Palpations: Abdomen is soft.     Tenderness: There is no abdominal tenderness. There is no guarding or rebound.     Hernia: No hernia is present.  Musculoskeletal:     Cervical back: Normal range of motion and neck supple.  Skin:    General: Skin is warm and dry.     Findings: No rash.  Neurological:     Mental Status: He is alert.     Deep Tendon Reflexes: Reflexes are normal and symmetric.  Psychiatric:        Speech: Speech normal.        Behavior: Behavior normal.        Judgment: Judgment normal.       Results for orders placed or performed in visit on 03/11/23  POC COVID-19   Collection Time: 03/11/23  4:17 PM  Result Value Ref Range   SARS Coronavirus 2 Ag Positive (A) Negative  POC Influenza A&B (Binax test)   Collection Time:  03/11/23  4:17 PM  Result Value Ref Range   Influenza A, POC Negative Negative   Influenza B, POC Negative Negative    Assessment and Plan  COVID-19 Assessment & Plan: COVID19  Infection < 5 days from onset of symptoms in  vaccinated overweight individual with history of  diastolic HF and DM, advanced age.  No clear sign of bacterial infection at this time.   No SOB.  No red flags/need for ER visit     Pt higher risk for COVID complications given  diastolic HF and DM, advanced age.Marland Kitchen GFR  67 andBUT has medication contraindications to paxlovid. Interaction with clopidogrel and paxlovid.. moderate... will try to see if molnupiravir available and covered... if not reconsider paxlovid as option given high risk individual.   Start molnupiravir 5 day course. Reviewed course of medication and side effect profile with patient in detail.   Symptomatic care with mucinex and cough suppressant at night. If SOB begins symptoms worsening.. have low threshold for in-person re-exam, if severe shortness of breath ER visit recommended.  Can monitor Oxygen saturation at home with home monitor if able to obtain.  Go to ER if O2 sat < 90% on room air.   Reviewed home care and provided information through MyChart.  Recommended quarantine 5 days isolation recommended. Return to work day 6 and wear mask for 4 more days to complete 10 days. Provided info about prevention of spread of COVID 19.    Acute cough -     POC COVID-19 BinaxNow -     POC Influenza A&B(BINAX/QUICKVUE)  Other orders -  molnupiravir EUA; Take 4 capsules (800 mg total) by mouth 2 (two) times daily for 5 days.  Dispense: 40 capsule; Refill: 0 -     Benzonatate; Take 1 capsule (200 mg total) by mouth 2 (two) times daily as needed for cough.  Dispense: 20 capsule; Refill: 0    No follow-ups on file.   Kerby Nora, MD

## 2023-03-11 NOTE — Assessment & Plan Note (Signed)
COVID19  Infection < 5 days from onset of symptoms in  vaccinated overweight individual with history of  diastolic HF and DM, advanced age.  No clear sign of bacterial infection at this time.   No SOB.  No red flags/need for ER visit     Pt higher risk for COVID complications given  diastolic HF and DM, advanced age.Marland Kitchen GFR  67 andBUT has medication contraindications to paxlovid. Interaction with clopidogrel and paxlovid.. moderate... will try to see if molnupiravir available and covered... if not reconsider paxlovid as option given high risk individual.   Start molnupiravir 5 day course. Reviewed course of medication and side effect profile with patient in detail.   Symptomatic care with mucinex and cough suppressant at night. If SOB begins symptoms worsening.. have low threshold for in-person re-exam, if severe shortness of breath ER visit recommended.  Can monitor Oxygen saturation at home with home monitor if able to obtain.  Go to ER if O2 sat < 90% on room air.   Reviewed home care and provided information through MyChart.  Recommended quarantine 5 days isolation recommended. Return to work day 6 and wear mask for 4 more days to complete 10 days. Provided info about prevention of spread of COVID 19.

## 2023-03-12 ENCOUNTER — Telehealth: Payer: Self-pay

## 2023-03-12 MED ORDER — NIRMATRELVIR/RITONAVIR (PAXLOVID)TABLET: 3.0000 | ORAL_TABLET | Freq: Two times a day (BID) | ORAL | 0 refills | Status: AC

## 2023-03-12 NOTE — Telephone Encounter (Signed)
Given Molnupiravir is not covered.. we will accept moderate  risk associated with decrease in efficacy with Plavix.  Paxlovid sent to pharmacy. Please make sure he hold the atorvastatin while onthis medication.

## 2023-03-12 NOTE — Addendum Note (Signed)
Addended by: Kerby Nora E on: 03/12/2023 08:39 AM   Modules accepted: Orders

## 2023-03-12 NOTE — Telephone Encounter (Signed)
[name] notified as instructed by telephone.  Patient states understanding.

## 2023-03-12 NOTE — Telephone Encounter (Signed)
Copied from CRM [id number]. Topic: Clinical - Prescription Issue >> Mar 12, 2023  8:20 AM Isabell A wrote: Reason for CRM: Patient states molnupiravir EUA (LAGEVRIO) 200 mg CAPS capsule was not covered by insurance, requesting another medication to be sent to his pharmacy.

## 2023-03-13 NOTE — Telephone Encounter (Addendum)
Spoke with [facility] and suggested sending new rxs, for both pts.  E-scribed new rxs.

## 2023-03-14 MED ORDER — LOSARTAN POTASSIUM 50 MG PO TABS: 50.0000 mg | ORAL_TABLET | Freq: Every day | ORAL | 1 refills | Status: DC

## 2023-03-14 MED ORDER — METFORMIN HCL 500 MG PO TABS: 500.0000 mg | ORAL_TABLET | Freq: Two times a day (BID) | ORAL | 1 refills | Status: DC

## 2023-03-14 MED ORDER — DULOXETINE HCL 30 MG PO CPEP: 30.0000 mg | ORAL_CAPSULE | Freq: Every day | ORAL | 1 refills | Status: DC

## 2023-03-14 MED ORDER — FINASTERIDE 5 MG PO TABS: 5.0000 mg | ORAL_TABLET | Freq: Every day | ORAL | 1 refills | Status: DC

## 2023-03-14 MED ORDER — FUROSEMIDE 20 MG PO TABS: 20.0000 mg | ORAL_TABLET | Freq: Every day | ORAL | 1 refills | Status: DC

## 2023-03-14 MED ORDER — ATORVASTATIN CALCIUM 40 MG PO TABS: 40.0000 mg | ORAL_TABLET | Freq: Every day | ORAL | 1 refills | Status: DC

## 2023-03-14 MED ORDER — TRAZODONE HCL 50 MG PO TABS: 75.0000 mg | ORAL_TABLET | Freq: Every day | ORAL | 1 refills | Status: DC

## 2023-03-14 MED ORDER — HYDROCHLOROTHIAZIDE 25 MG PO TABS: 25.0000 mg | ORAL_TABLET | Freq: Every day | ORAL | 1 refills | Status: DC

## 2023-03-14 NOTE — Addendum Note (Signed)
Addended by: Nanci Pina on: 03/14/2023 12:55 PM   Modules accepted: Orders

## 2023-04-10 ENCOUNTER — Observation Stay: Payer: Medicare (Managed Care)

## 2023-04-10 ENCOUNTER — Observation Stay
Admission: EM | Admit: 2023-04-10 | Discharge: 2023-04-11 | Disposition: A | Discharge status: Home / Self Care | Payer: Medicare (Managed Care) | Attending: Internal Medicine | Admitting: Internal Medicine

## 2023-04-10 ENCOUNTER — Other Ambulatory Visit: Payer: Self-pay

## 2023-04-10 ENCOUNTER — Emergency Department: Payer: Medicare (Managed Care)

## 2023-04-10 ENCOUNTER — Encounter: Payer: Self-pay | Admitting: Internal Medicine

## 2023-04-10 DIAGNOSIS — K589 Irritable bowel syndrome without diarrhea: Secondary | ICD-10-CM | POA: Diagnosis not present

## 2023-04-10 DIAGNOSIS — Z7902 Long term (current) use of antithrombotics/antiplatelets: Secondary | ICD-10-CM | POA: Insufficient documentation

## 2023-04-10 DIAGNOSIS — E1169 Type 2 diabetes mellitus with other specified complication: Secondary | ICD-10-CM | POA: Diagnosis not present

## 2023-04-10 DIAGNOSIS — Z7984 Long term (current) use of oral hypoglycemic drugs: Secondary | ICD-10-CM | POA: Insufficient documentation

## 2023-04-10 DIAGNOSIS — G510 Bell's palsy: Secondary | ICD-10-CM | POA: Diagnosis not present

## 2023-04-10 DIAGNOSIS — Z951 Presence of aortocoronary bypass graft: Secondary | ICD-10-CM | POA: Diagnosis not present

## 2023-04-10 DIAGNOSIS — Z79899 Other long term (current) drug therapy: Secondary | ICD-10-CM | POA: Insufficient documentation

## 2023-04-10 DIAGNOSIS — N401 Enlarged prostate with lower urinary tract symptoms: Secondary | ICD-10-CM | POA: Insufficient documentation

## 2023-04-10 DIAGNOSIS — G4700 Insomnia, unspecified: Secondary | ICD-10-CM | POA: Diagnosis not present

## 2023-04-10 DIAGNOSIS — R1310 Dysphagia, unspecified: Secondary | ICD-10-CM | POA: Insufficient documentation

## 2023-04-10 DIAGNOSIS — Z955 Presence of coronary angioplasty implant and graft: Secondary | ICD-10-CM | POA: Diagnosis not present

## 2023-04-10 DIAGNOSIS — K219 Gastro-esophageal reflux disease without esophagitis: Secondary | ICD-10-CM | POA: Diagnosis not present

## 2023-04-10 DIAGNOSIS — Z8673 Personal history of transient ischemic attack (TIA), and cerebral infarction without residual deficits: Secondary | ICD-10-CM | POA: Diagnosis not present

## 2023-04-10 DIAGNOSIS — R29818 Other symptoms and signs involving the nervous system: Principal | ICD-10-CM | POA: Insufficient documentation

## 2023-04-10 DIAGNOSIS — R296 Repeated falls: Secondary | ICD-10-CM

## 2023-04-10 DIAGNOSIS — I10 Essential (primary) hypertension: Secondary | ICD-10-CM | POA: Diagnosis not present

## 2023-04-10 DIAGNOSIS — Z7982 Long term (current) use of aspirin: Secondary | ICD-10-CM | POA: Diagnosis not present

## 2023-04-10 DIAGNOSIS — N138 Other obstructive and reflux uropathy: Secondary | ICD-10-CM | POA: Diagnosis not present

## 2023-04-10 DIAGNOSIS — R2990 Unspecified symptoms and signs involving the nervous system: Secondary | ICD-10-CM | POA: Diagnosis present

## 2023-04-10 DIAGNOSIS — Z8616 Personal history of COVID-19: Secondary | ICD-10-CM | POA: Insufficient documentation

## 2023-04-10 DIAGNOSIS — E785 Hyperlipidemia, unspecified: Secondary | ICD-10-CM | POA: Insufficient documentation

## 2023-04-10 DIAGNOSIS — H6691 Otitis media, unspecified, right ear: Secondary | ICD-10-CM | POA: Diagnosis not present

## 2023-04-10 DIAGNOSIS — I2510 Atherosclerotic heart disease of native coronary artery without angina pectoris: Secondary | ICD-10-CM | POA: Diagnosis present

## 2023-04-10 DIAGNOSIS — Z7901 Long term (current) use of anticoagulants: Secondary | ICD-10-CM | POA: Insufficient documentation

## 2023-04-10 DIAGNOSIS — Z85828 Personal history of other malignant neoplasm of skin: Secondary | ICD-10-CM | POA: Insufficient documentation

## 2023-04-10 DIAGNOSIS — I639 Cerebral infarction, unspecified: Principal | ICD-10-CM

## 2023-04-10 DIAGNOSIS — E1140 Type 2 diabetes mellitus with diabetic neuropathy, unspecified: Secondary | ICD-10-CM | POA: Diagnosis present

## 2023-04-10 HISTORY — DX: Bell's palsy: G51.0

## 2023-04-10 LAB — COMPREHENSIVE METABOLIC PANEL
ALT: 35 U/L (ref 0–44)
AST: 29 U/L (ref 15–41)
Albumin: 4.1 g/dL (ref 3.5–5.0)
Alkaline Phosphatase: 59 U/L (ref 38–126)
Anion gap: 13 (ref 5–15)
BUN: 21 mg/dL (ref 8–23)
CO2: 26 mmol/L (ref 22–32)
Calcium: 9.7 mg/dL (ref 8.9–10.3)
Chloride: 96 mmol/L — ABNORMAL LOW (ref 98–111)
Creatinine, Ser: 1.09 mg/dL (ref 0.61–1.24)
GFR, Estimated: >60 mL/min (ref >60)
Glucose, Bld: 198 mg/dL — ABNORMAL HIGH (ref 70–99)
Potassium: 3.3 mmol/L — ABNORMAL LOW (ref 3.5–5.1)
Sodium: 135 mmol/L (ref 135–145)
Total Bilirubin: 0.9 mg/dL (ref 0.0–1.2)
Total Protein: 7.3 g/dL (ref 6.5–8.1)

## 2023-04-10 LAB — DIFFERENTIAL
Abs Immature Granulocytes: 0.02 10*3/uL (ref 0.00–0.07)
Basophils Absolute: 0.1 10*3/uL (ref 0.0–0.1)
Basophils Relative: 1 %
Eosinophils Absolute: 0.2 10*3/uL (ref 0.0–0.5)
Eosinophils Relative: 3 %
Immature Granulocytes: 0 %
Lymphocytes Relative: 25 %
Lymphs Abs: 1.9 10*3/uL (ref 0.7–4.0)
Monocytes Absolute: 0.6 10*3/uL (ref 0.1–1.0)
Monocytes Relative: 8 %
Neutro Abs: 4.8 10*3/uL (ref 1.7–7.7)
Neutrophils Relative %: 63 %

## 2023-04-10 LAB — GLUCOSE, CAPILLARY
Glucose-Capillary: 154 mg/dL — ABNORMAL HIGH (ref 70–99)
Glucose-Capillary: 108 mg/dL — ABNORMAL HIGH (ref 70–99)

## 2023-04-10 LAB — CBC
HCT: 41.3 % (ref 39.0–52.0)
Hemoglobin: 14.3 g/dL (ref 13.0–17.0)
MCH: 30.2 pg (ref 26.0–34.0)
MCHC: 34.6 g/dL (ref 30.0–36.0)
MCV: 87.3 fL (ref 80.0–100.0)
Platelets: 174 10*3/uL (ref 150–400)
RBC: 4.73 MIL/uL (ref 4.22–5.81)
RDW: 13.2 % (ref 11.5–15.5)
WBC: 7.7 10*3/uL (ref 4.0–10.5)
nRBC: 0 % (ref 0.0–0.2)

## 2023-04-10 LAB — CBG MONITORING, ED: Glucose-Capillary: 195 mg/dL — ABNORMAL HIGH (ref 70–99)

## 2023-04-10 LAB — PROTIME-INR
INR: 1.1 (ref 0.8–1.2)
Prothrombin Time: 14.6 s (ref 11.4–15.2)

## 2023-04-10 LAB — APTT: aPTT: 38 s — ABNORMAL HIGH (ref 24–36)

## 2023-04-10 LAB — ETHANOL: Alcohol, Ethyl (B): <10 mg/dL (ref <10)

## 2023-04-10 MED ORDER — CLOPIDOGREL BISULFATE 75 MG PO TABS
75.0000 mg | ORAL_TABLET | Freq: Every day | ORAL | Status: DC
  Administered 2023-04-11: 75 mg via ORAL
  Filled 2023-04-10: qty 1

## 2023-04-10 MED ORDER — GABAPENTIN 300 MG PO CAPS: 600.0000 mg | ORAL_CAPSULE | Freq: Every day | ORAL | Status: DC

## 2023-04-10 MED ORDER — FLUTICASONE PROPIONATE 50 MCG/ACT NA SUSP
2.0000 | Freq: Every day | NASAL | Status: DC | PRN
Start: 2023-04-10 — End: 2023-04-11

## 2023-04-10 MED ORDER — ASPIRIN 81 MG PO TBEC
81.0000 mg | DELAYED_RELEASE_TABLET | ORAL | Status: DC
Start: 2023-04-11 — End: 2023-04-11
  Administered 2023-04-11: 81 mg via ORAL
  Filled 2023-04-10: qty 1

## 2023-04-10 MED ORDER — TRAZODONE HCL 50 MG PO TABS: 75.0000 mg | ORAL_TABLET | Freq: Every day | ORAL | Status: DC

## 2023-04-10 MED ORDER — SODIUM CHLORIDE 0.9% FLUSH
3.0000 mL | Freq: Once | INTRAVENOUS | Status: AC
  Administered 2023-04-10: 3 mL via INTRAVENOUS

## 2023-04-10 MED ORDER — ACETAMINOPHEN 325 MG PO TABS: 650.0000 mg | ORAL_TABLET | ORAL | Status: DC | PRN

## 2023-04-10 MED ORDER — ACETAMINOPHEN 160 MG/5ML PO SOLN: 650.0000 mg | ORAL | Status: DC | PRN

## 2023-04-10 MED ORDER — STROKE: EARLY STAGES OF RECOVERY BOOK: Freq: Once | Status: AC

## 2023-04-10 MED ORDER — ATORVASTATIN CALCIUM 20 MG PO TABS
40.0000 mg | ORAL_TABLET | Freq: Every day | ORAL | Status: DC
  Administered 2023-04-11: 40 mg via ORAL
  Filled 2023-04-10: qty 2

## 2023-04-10 MED ORDER — MELATONIN 5 MG PO TABS: 5.0000 mg | ORAL_TABLET | Freq: Every evening | ORAL | Status: DC | PRN

## 2023-04-10 MED ORDER — HYDRALAZINE HCL 20 MG/ML IJ SOLN: 5.0000 mg | Freq: Four times a day (QID) | INTRAMUSCULAR | Status: DC | PRN

## 2023-04-10 MED ORDER — ACETAMINOPHEN 650 MG RE SUPP: 650.0000 mg | RECTAL | Status: DC | PRN

## 2023-04-10 MED ORDER — DULOXETINE HCL 30 MG PO CPEP
30.0000 mg | ORAL_CAPSULE | Freq: Every day | ORAL | Status: DC
  Administered 2023-04-11: 30 mg via ORAL
  Filled 2023-04-10: qty 1

## 2023-04-10 MED ORDER — INSULIN ASPART 100 UNIT/ML IJ SOLN
0.0000 [IU] | Freq: Three times a day (TID) | INTRAMUSCULAR | Status: DC
  Administered 2023-04-10: 3 [IU] via SUBCUTANEOUS
  Administered 2023-04-11: 2 [IU] via SUBCUTANEOUS
  Filled 2023-04-10 (×2): qty 1

## 2023-04-10 MED ORDER — INSULIN ASPART 100 UNIT/ML IJ SOLN: 0.0000 [IU] | Freq: Every day | INTRAMUSCULAR | Status: DC

## 2023-04-10 MED ORDER — SENNOSIDES-DOCUSATE SODIUM 8.6-50 MG PO TABS: 1.0000 | ORAL_TABLET | Freq: Every evening | ORAL | Status: DC | PRN

## 2023-04-10 MED ORDER — POLYETHYLENE GLYCOL 3350 17 G PO PACK
8.5000 g | PACK | Freq: Every day | ORAL | Status: DC | PRN
Start: 2023-04-10 — End: 2023-04-11

## 2023-04-10 MED ORDER — IPRATROPIUM-ALBUTEROL 0.5-2.5 (3) MG/3ML IN SOLN: 3.0000 mL | Freq: Four times a day (QID) | RESPIRATORY_TRACT | Status: DC | PRN

## 2023-04-10 MED ORDER — AMOXICILLIN-POT CLAVULANATE 875-125 MG PO TABS
1.0000 | ORAL_TABLET | Freq: Two times a day (BID) | ORAL | Status: DC
  Administered 2023-04-11: 1 via ORAL
  Filled 2023-04-10: qty 1

## 2023-04-10 NOTE — Consult Note (Addendum)
 NEUROLOGY CONSULT NOTE   Date of service: April 10, 2023 Patient Name: Aaron Stafford MRN:  [id number] DOB:  01-19-1935 Chief Complaint: Left facial droop Requesting Provider: No att. providers found  History of Present Illness  Aaron Stafford is a 89 y.o. male with a PMHx of Bell's palsy approximately 10 years ago (the patient does not recall which side), CAD s/p CABG and stent placment, DM, diastolic dysfunction, diverticulosis, fatty liver, GERD, BPH, shingles, HLD, HTN, ischemic heart disease and melanoma who presents to the ED as a Code Stroke after new onset of right facial droop. Symptoms began yesterday with leakage of fluid from the right side of his mouth and right lower quadrant facial weakness. This morning the symptoms were still present, but he also had difficulty with fully closing his right eye. He denies any limb numbness, limb weakness, vision changes or aphasia. Does not endorse any ataxia or gait difficulty. No right ear pain or drainage. No loss of taste. No hyperacusis.    LKW: 1600 on Wednesday Modified rankin score: 0-Completely asymptomatic and back to baseline post- stroke IV Thrombolysis: No: Presentation is most consistent with Bell's palsy   NIHSS components Score: Comment  1a Level of Conscious 0[x]  1[]  2[]  3[]      1b LOC Questions 0[x]  1[]  2[]       1c LOC Commands 0[x]  1[]  2[]       2 Best Gaze 0[x]  1[]  2[]       3 Visual 0[x]  1[]  2[]  3[]      4 Facial Palsy 0[]  1[]  2[x]  3[]      5a Motor Arm - left 0[x]  1[]  2[]  3[]  4[]  UN[]    5b Motor Arm - Right 0[x]  1[]  2[]  3[]  4[]  UN[]    6a Motor Leg - Left 0[x]  1[]  2[]  3[]  4[]  UN[]    6b Motor Leg - Right 0[x]  1[]  2[]  3[]  4[]  UN[]    7 Limb Ataxia 0[x]  1[]  2[]  3[]  UN[]     8 Sensory 0[x]  1[]  2[]  UN[]      9 Best Language 0[x]  1[]  2[]  3[]      10 Dysarthria 0[]  1[x]  2[]  UN[]      11 Extinct. and Inattention 0[x]  1[]  2[]       TOTAL:    3      ROS  Comprehensive ROS performed and pertinent positives documented in HPI     Past History   Past Medical History:  Diagnosis Date   CAD (coronary artery disease)    a. 1995 s/p CABG x 4 (LIMA->LAD, VG->OM1->OM2, VG->RCA); b. 07/2009 PCI: LM 60, LAD 100ost, LCX 100p, RCA 100, LIMA->LAD atretic, VG->OM1->OM2 patent w/ collats to dLAD, VG->RCA 95 (3.5x15 Vision BMS). EF 55%; c. 02/2021 MV: EF 30% (50-55% by echo), large inferolateral scar w/ HK. No ischemia.   Chronic rhinitis    [facility]   COVID-19 virus infection 09/2019   Diabetes mellitus type II    Diastolic dysfunction    a. 03/2021 Echo: EF 50-55%, no rwma, GrI DD. RVSP 35.53mmHg. Mild MR.   Diverticulosis 06/2011   by CT and colonoscopy   Fatty liver 06/2011   by CT   GERD (gastroesophageal reflux disease)    History of BPH    History of shingles    HLD (hyperlipidemia)    HTN (hypertension)    Ischemic heart disease    Melanoma (HCC) 12/2015   L forearm, scalp   MI (myocardial infarction) (HCC) 1995   Shingles rash 09/26/2012   Sialadenitis 05/01/2017    Past Surgical History:  Procedure Laterality Date   ANKLE SURGERY  03/2002   Left ankle fracture   CARDIAC CATHETERIZATION     CHOLECYSTECTOMY  1985   COLONOSCOPY     diverticulosis, tortuous colon with looping Marva Panda)   CORONARY ARTERY BYPASS GRAFT  05/1993   x6 MI Attemped angioplasty   CORONARY STENT PLACEMENT  07/25/2009   Multi-Link VISION Cobalt chromium    CYSTOSCOPY  10/03/08   Normal-Dr. Lonna Cobb   INGUINAL HERNIA REPAIR  11/2000   and ventral hernia repair    Family History: Family History  Problem Relation Age of Onset   Kidney failure Mother    Coronary artery disease Mother    Heart failure Mother    Hypertension Brother    Heart attack Brother    Alcohol abuse Brother    Hypertension Brother    Hypertension Sister     Social History  reports that he has never smoked. He has never used smokeless tobacco. He reports that he does not drink alcohol and does not use drugs.  Allergies  Allergen Reactions    Morphine Sulfate     REACTION: Hallucinations   Nitroglycerin     REACTION: Decreased BP   Contrast Media [Iodinated Contrast Media] Rash    Severe hives    Medications   Current Facility-Administered Medications:    sodium chloride flush (NS) 0.9 % injection 3 mL, 3 mL, Intravenous, Once, [place], MD  Current Outpatient Medications:    Accu-Chek Softclix Lancets lancets, Use as instructed to check sugars once daily E11.42, Disp: 100 each, Rfl: 3   Alpha-Lipoic Acid 600 MG CAPS, Take 1 capsule (600 mg total) by mouth daily., Disp: , Rfl:    aspirin EC 81 MG tablet, Take 1 tablet (81 mg total) by mouth every Monday, Wednesday, and Friday. Swallow whole., Disp: , Rfl:    atorvastatin (LIPITOR) 40 MG tablet, Take 1 tablet (40 mg total) by mouth daily., Disp: 90 tablet, Rfl: 1   benzonatate (TESSALON) 200 MG capsule, Take 1 capsule (200 mg total) by mouth 2 (two) times daily as needed for cough., Disp: 20 capsule, Rfl: 0   Blood Glucose Monitoring Suppl (ACCU-CHEK AVIVA PLUS) w/Device KIT, Use as directed to check sugars daily. E11.42, Disp: 1 kit, Rfl: 0   clopidogrel (PLAVIX) 75 MG tablet, Take 1 tablet (75 mg total) by mouth daily., Disp: , Rfl:    Cranberry 500 MG CAPS, Take 2 capsules (1,000 mg total) by mouth daily., Disp: , Rfl:    DULoxetine (CYMBALTA) 30 MG capsule, Take 1 capsule (30 mg total) by mouth daily., Disp: 90 capsule, Rfl: 1   finasteride (PROSCAR) 5 MG tablet, Take 1 tablet (5 mg total) by mouth daily., Disp: 90 tablet, Rfl: 1   fluticasone (FLONASE) 50 MCG/ACT nasal spray, SPRAY 2 SPRAYS INTO EACH NOSTRIL EVERY DAY, Disp: 48 mL, Rfl: 3   furosemide (LASIX) 20 MG tablet, Take 1 tablet (20 mg total) by mouth daily., Disp: 90 tablet, Rfl: 1   gabapentin (NEURONTIN) 300 MG capsule, Take 2 capsules (600 mg total) by mouth at bedtime., Disp: , Rfl:    glucose blood (ACCU-CHEK AVIVA PLUS) test strip, Use as instructed to check sugars daily E11.42, Disp: 100 each, Rfl: 3    hydrochlorothiazide (HYDRODIURIL) 25 MG tablet, Take 1 tablet (25 mg total) by mouth daily., Disp: 90 tablet, Rfl: 1   hydrocortisone 2.5 % cream, Apply 1 Application topically 2 (two) times daily., Disp: , Rfl:    ipratropium (ATROVENT) 0.06 %  nasal spray, Place 2 sprays into both nostrils 4 (four) times daily., Disp: 15 mL, Rfl: 0   isosorbide mononitrate (IMDUR) 30 MG 24 hr tablet, Take 1 tablet (30 mg total) by mouth daily., Disp: 90 tablet, Rfl: 3   ketoconazole (NIZORAL) 2 % cream, Apply topically., Disp: , Rfl:    losartan (COZAAR) 50 MG tablet, Take 1 tablet (50 mg total) by mouth daily., Disp: 90 tablet, Rfl: 1   metFORMIN (GLUCOPHAGE) 500 MG tablet, Take 1 tablet (500 mg total) by mouth 2 (two) times daily with a meal., Disp: 180 tablet, Rfl: 1   mirabegron ER (MYRBETRIQ) 25 MG TB24 tablet, Take 1 tablet (25 mg total) by mouth daily., Disp: 30 tablet, Rfl: 3   mupirocin ointment (BACTROBAN) 2 %, Apply 1 Application topically 2 (two) times daily., Disp: , Rfl:    polyethylene glycol powder (GLYCOLAX/MIRALAX) 17 GM/SCOOP powder, TAKE 8.5-17 G BY MOUTH DAILY AS NEEDED FOR MODERATE CONSTIPATION., Disp: 510 g, Rfl: 1   traZODone (DESYREL) 50 MG tablet, Take 1.5 tablets (75 mg total) by mouth at bedtime., Disp: 135 tablet, Rfl: 1  Vitals   Vitals:   04/10/23 1217  BP: (!) 165/85  Pulse: 75  Resp: 14  Temp: 98.4 F (36.9 C)  TempSrc: Oral  SpO2: 98%    There is no height or weight on file to calculate BMI.  Physical Exam   Physical Exam HEENT- [place]/AT. No tenderness to right ear. No drainage or sores seen by visual inspection of right ear.  Lungs- Respirations unlabored Extremities- Warm and well-perfused  Neurological Examination Mental Status: Awake and alert. Fully oriented. Thought content appropriate.  Speech fluent without evidence of aphasia.  Able to follow all commands without difficulty. Dysarthria is noted in the context of prominent right facial droop.  Cranial  Nerves: II: Temporal visual fields intact with no extinction to DSS. PERRL. III,IV, VI: EOMI. No nystagmus. V: Temp sensation equal bilaterally VII: Prominent weakness of right lower quadrant of face. Also with incomplete closure of right eye when blinking and barely can completely close right eye with forced eye closure.  VIII: Hearing intact to voice IX,X: No hypophonia or hoarseness XI: Symmetric XII: Midline tongue extension Motor: RUE: 5/5 LUE: 5/5 RLE: 5/5 LLE: 5/5 Sensory: Temp and FT intact x 4. No extinction to DSS. Deep Tendon Reflexes: 2+ and symmetric bilateral biceps, brachioradialis and patellae Plantars: Right: downgoingLeft: downgoing Cerebellar: No ataxia with FNF bilaterally Gait: Able to stand and ambulate with own power.   Labs/Imaging/Neurodiagnostic studies   CBC: No results for input(s): "WBC", "NEUTROABS", "HGB", "HCT", "MCV", "PLT" in the last 168 hours. Basic Metabolic Panel:  Lab Results  Component Value Date   NA 135 09/03/2022   K 4.3 09/03/2022   CO2 31 09/03/2022   GLUCOSE 139 (H) 09/03/2022   BUN 19 09/03/2022   CREATININE 1.04 09/03/2022   CALCIUM 9.9 09/03/2022   GFRNONAA >60 05/23/2022   GFRAA >60 10/08/2019   Lipid Panel:  Lab Results  Component Value Date   LDLCALC 47 09/03/2022   HgbA1c:  Lab Results  Component Value Date   HGBA1C 7.1 (A) 03/07/2023   Urine Drug Screen: No results found for: "LABOPIA", "COCAINSCRNUR", "LABBENZ", "AMPHETMU", "THCU", "LABBARB"  Alcohol Level No results found for: "ETH" INR  Lab Results  Component Value Date   INR 1.2 06/27/2021   APTT  Lab Results  Component Value Date   APTT 48 (H) 06/27/2021     ASSESSMENT  Aaron Stafford  is a 89 y.o. male presenting with symptoms and signs that are most consistent with an acute right Bell's palsy.  - Exam as documented above. NIHSS 3.  - CT head: No evidence of an acute intracranial abnormality. Parenchymal atrophy and chronic small vessel ischemic  disease. Small chronic infarcts within the cerebellar vermis and right cerebellar hemisphere. - Not a TNK candidate due to most likely presentation being Bell's palsy.   RECOMMENDATIONS  - MRI brain to rule out a small brainstem stroke or inflammatory lesion. Call Neurology if MRI is positive.  - Prednisone 60 mg po every day x 6 days, then taper by 10 mg per day, then stop.  - Valacyclovir 1000 mg po TID x 7 days - Outpatient Neurology follow up  ______________________________________________________________________    Dessa Phi, Ondine Gemme, MD [facility]

## 2023-04-10 NOTE — Assessment & Plan Note (Signed)
 -  Melatonin 5 mg nightly as needed for sleep

## 2023-04-10 NOTE — Progress Notes (Signed)
 Pt back on unit from MRI

## 2023-04-10 NOTE — Assessment & Plan Note (Signed)
 Home atorvastatin 40 mg daily resumed

## 2023-04-10 NOTE — ED Notes (Signed)
 First nurse note: pt to ED with family for right sided weakness, aphasia, ride sided facial droop started at 1600 yesterday. Pt meets VAN + criteria, Code stroke called.

## 2023-04-10 NOTE — Plan of Care (Signed)
  Problem: Coping: Goal: Ability to adjust to condition or change in health will improve Outcome: Progressing   Problem: Health Behavior/Discharge Planning: Goal: Ability to manage health-related needs will improve Outcome: Progressing   Problem: Metabolic: Goal: Ability to maintain appropriate glucose levels will improve Outcome: Progressing   Problem: Nutritional: Goal: Progress toward achieving an optimal weight will improve Outcome: Progressing

## 2023-04-10 NOTE — Assessment & Plan Note (Signed)
 Home losartan 50 mg daily, isosorbide mononitrate 30 mg daily, furosemide 20 mg daily, hydrochlorothiazide 25 mg daily will not be resumed on admission as patient will need permissive hypertension AM team to resume when the benefits outweigh the risk

## 2023-04-10 NOTE — Progress Notes (Signed)
   04/10/23 1200  Spiritual Encounters  Type of Visit Initial  Referral source Code page  Reason for visit  (Code stroke)  OnCall Visit Yes   Chaplain to ED in response to code stroke. Patient currently cared for by interdisciplinary team.  Chaplain spiritual support services remain available as the need arises.

## 2023-04-10 NOTE — Assessment & Plan Note (Signed)
 Stroke-like symptoms Neurology has been consulted and we appreciate further recommendations Complete echo MRI of the brain Fasting lipid  Permissive hypertension per neurology recommendations Frequent neuro vascular checks N.p.o. pending swallow study PT, OT, SLP Fall precaution and aspiration precaution

## 2023-04-10 NOTE — Code Documentation (Signed)
 Stroke Response Nurse Documentation Code Documentation  FAYEZ STURGELL is a 89 y.o. male arriving to [facility] via [employer] [facility] on 04/10/2023 with past medical hx of HTN, HLD, DM, MI, ischemic heart disease, shingles, diastolic function, CAD, cardiac cath with stent placement. On aspirin 81 mg daily and Brilinta (ticagrelor) 90 mg bid. Code stroke was activated by ED.   Patient from home where he was LKW at 1600 2/19 and now complaining of  right sided facial droop and slurred speech. Pt reports he noticed sudden onset inability to whistle yesterday around 1600. Right sided facial droop noticed at that time. Reports never resolved. This morning, he reports facial droop worsened, involving eye, and care to hospital. Reports history of Bell's Palsy in the past.  Stroke team at the bedside on patient arrival. Labs drawn and patient cleared for CT by Dr. Scotty Court. Patient to CT with team. NIHSS 4>3, see documentation for details and code stroke times. Patient with right facial droop, right leg weakness, and dysarthria  on exam. The following imaging was completed:  CT Head. Patient is not a candidate for IV Thrombolytic due to outside window, per MD. Patient is not a candidate for IR due to LVO not suspected, per MD. BEFAST education provided to patient and family at bedside.   Care Plan: every 2 hour NIHSS + VS. Swallow screen per order.   Bedside handoff with ED RN Stark Bray Theressa Millard  Stroke Response RN

## 2023-04-10 NOTE — ED Triage Notes (Addendum)
 Presents with right sided facial droop. Patient reports yesterday he could not whistle and drink water. Symptoms started at 4pm yesterday afternoon. This morning at 0830am he could not blink his right eye. Presents with facial droop to the right side of the mouth. Takes Eliquis. LKW 04/09/23 at 0830 per wife.

## 2023-04-10 NOTE — H&P (Signed)
 History and Physical   [name] NWG:[id number] DOB: [birth year] DOA: 04/10/2023  PCP: Aaron Boyden, MD  Outpatient Specialists: Dr. Mariah [facility], [facility] Patient coming from: home  I have personally briefly reviewed patient's old medical records in [facility] EMR.  Chief Concern: right facial droop and can't blink right eye  HPI: [name] is a 89 year old male with history of hyperlipidemia, depression, anxiety, neuropathy, non-insulin-dependent diabetes mellitus, hypertension, BPH, who presents emergency department for chief concerns of right-sided facial droop at approximately 4 PM yesterday on 04/09/2023.  Vitals in the ED showed temperature of 98.4, respiration rate 14, heart rate 75, blood pressure 165/85, SpO2 98% on room air.  Serum sodium is 135, potassium 3.3, chloride 96, bicarb 26, BUN of 21, serum creatinine 1.09, EGFR greater than 60, nonfasting blood glucose 198, WBC 7.7, hemoglobin 14.3, platelets of 174.  EtOH level was negative.  ED treatment: None. -------------------------------------------- At bedside, patient is able to tell me his name, age, current location, current calendar year.  He reports symptoms started around 4p yesterday. He reprots he tried to eat breakfast this morning with coffee and energy drink and some of the liquid dripped out of the mouth.   He deneis chest pain, chest pressure, dysuria, hematuria, diarrhea, blood in his stool. He denies swelling of his legs. He deneis doubble vision.   He feels congestion on right maxillary sinus area and fullness in his right ear. He reports this started on Monday, 04/07/23.  He denies cough, fever, chills, known sick contacts.  He denies trouble walking.   Social history: He has been married 67 years. He denies tobacco, etoh, recreational drug use. 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   ROS: Constitutional: no  weight change, no fever ENT/Mouth: no sore throat, no rhinorrhea Eyes: no eye pain, no vision changes Cardiovascular: no chest pain, no dyspnea,  no edema, no palpitations Respiratory: no cough, no sputum, no wheezing Gastrointestinal: no nausea, no vomiting, no diarrhea, no constipation Genitourinary: no urinary incontinence, no dysuria, no hematuria Musculoskeletal: no arthralgias, no myalgias Skin: no skin lesions, no pruritus, Neuro: + weakness, no loss of consciousness, no syncope Psych: no anxiety, no depression, + decrease appetite Heme/Lymph: no bruising, no bleeding  ED Course: Discussed with EDP, patient requiring hospitalization for chief concern of stroke like symptoms.  Assessment/Plan  Principal Problem:   Stroke-like symptom Active Problems:   S/P CABG (coronary artery bypass graft)   History of cerebellar stroke   Recurrent falls   CAD (coronary artery disease)   HTN (hypertension)   Type 2 diabetes mellitus with diabetic neuropathy, unspecified (HCC)   Irritable bowel syndrome   Insomnia   Hyperlipidemia associated with type 2 diabetes mellitus (HCC)   GERD (gastroesophageal reflux disease)   BPH with obstruction/lower urinary tract symptoms   Right otitis media   Assessment and Plan:  * Stroke-like symptom Stroke-like symptoms Neurology has been consulted and we appreciate further recommendations Complete echo MRI of the brain Fasting lipid  Permissive hypertension per neurology recommendations Frequent neuro vascular checks N.p.o. pending swallow study PT, OT, SLP Fall precaution and aspiration precaution  Recurrent falls Fall precautions  S/P CABG (coronary artery bypass graft) Aspirin 81 mg daily, clopidogrel 75 mg daily resumed on admission  Type 2 diabetes mellitus with diabetic neuropathy, unspecified (HCC) Home metformin will not be resumed on admission Insulin SSI with at bedtime coverage ordered Home gabapentin 600  mg nightly  resumed Goal inpatient blood glucose level is 140-180  HTN (hypertension) Home losartan 50 mg daily, isosorbide mononitrate 30 mg daily, furosemide 20 mg daily, hydrochlorothiazide 25 mg daily will not be resumed on admission as patient will need permissive hypertension AM team to resume when the benefits outweigh the risk  Hyperlipidemia associated with type 2 diabetes mellitus (HCC) Home atorvastatin 40 mg daily resumed  Insomnia Melatonin 5 mg nightly as needed for sleep  Right otitis media Augmentin 875-125 mg p.o. every 12 hours, 5 days ordered but  Chart reviewed.   DVT prophylaxis: ted hose Code Status: DNR/DNI Diet: npo; pending swallow screening and SLP evaluation Family Communication: Patient states that he has updated his wife and children already Disposition Plan: pending clinical course; neurology recommendation Consults called: neurology Admission status: telemetry medical, observation  Past Medical History:  Diagnosis Date   CAD (coronary artery disease)    a. 1995 s/p CABG x 4 (LIMA->LAD, VG->OM1->OM2, VG->RCA); b. 07/2009 PCI: LM 60, LAD 100ost, LCX 100p, RCA 100, LIMA->LAD atretic, VG->OM1->OM2 patent w/ collats to dLAD, VG->RCA 95 (3.5x15 Vision BMS). EF 55%; c. 02/2021 MV: EF 30% (50-55% by echo), large inferolateral scar w/ HK. No ischemia.   Chronic rhinitis    [facility] [place]   COVID-19 virus infection 09/2019   Diabetes mellitus type II    Diastolic dysfunction    a. 03/2021 Echo: EF 50-55%, no rwma, GrI DD. RVSP 35.19mmHg. Mild MR.   Diverticulosis 06/2011   by CT and colonoscopy   Fatty liver 06/2011   by CT   GERD (gastroesophageal reflux disease)    History of BPH    History of shingles    HLD (hyperlipidemia)    HTN (hypertension)    Ischemic heart disease    Melanoma (HCC) 12/2015   L forearm, scalp   MI (myocardial infarction) (HCC) 1995   Shingles rash 09/26/2012   Sialadenitis 05/01/2017   Past Surgical History:  Procedure Laterality  Date   ANKLE SURGERY  03/2002   Left ankle fracture   CARDIAC CATHETERIZATION     CHOLECYSTECTOMY  1985   COLONOSCOPY     diverticulosis, tortuous colon with looping Aaron Panda)   CORONARY ARTERY BYPASS GRAFT  05/1993   x6 MI Attemped angioplasty   CORONARY STENT PLACEMENT  07/25/2009   Multi-Link VISION Cobalt chromium    CYSTOSCOPY  10/03/08   Normal-Dr. Lonna Cobb   INGUINAL HERNIA REPAIR  11/2000   and ventral hernia repair   Social History:  reports that he has never smoked. He has never used smokeless tobacco. He reports that he does not drink alcohol and does not use drugs.  Allergies  Allergen Reactions   Morphine Sulfate     REACTION: Hallucinations   Nitroglycerin     REACTION: Decreased BP   Contrast Media [Iodinated Contrast Media] Rash    Severe hives   Family History  Problem Relation Age of Onset   Kidney failure Mother    Coronary artery disease Mother    Heart failure Mother    Hypertension Brother    Heart attack Brother    Alcohol abuse Brother    Hypertension Brother    Hypertension Sister    Family history: Family history reviewed and not pertinent.  Prior to Admission medications   Medication Sig Start Date End Date Taking? Authorizing Provider  Accu-Chek Softclix Lancets lancets Use as instructed to check sugars once daily E11.42 03/20/22   Aaron Boyden, MD  Alpha-Lipoic Acid 600 MG  CAPS Take 1 capsule (600 mg total) by mouth daily. 09/03/22   Aaron Boyden, MD  aspirin EC 81 MG tablet Take 1 tablet (81 mg total) by mouth every Monday, Wednesday, and Friday. Swallow whole. 03/04/22   Aaron Boyden, MD  atorvastatin (LIPITOR) 40 MG tablet Take 1 tablet (40 mg total) by mouth daily. 03/14/23   Aaron Boyden, MD  benzonatate (TESSALON) 200 MG capsule Take 1 capsule (200 mg total) by mouth 2 (two) times daily as needed for cough. 03/11/23   Bedsole, Juan Kissoon E, MD  Blood Glucose Monitoring Suppl (ACCU-CHEK AVIVA PLUS) w/Device KIT Use as directed to  check sugars daily. E11.42 03/20/22   Aaron Boyden, MD  clopidogrel (PLAVIX) 75 MG tablet Take 1 tablet (75 mg total) by mouth daily. 03/04/22   Aaron Boyden, MD  Cranberry 500 MG CAPS Take 2 capsules (1,000 mg total) by mouth daily. 02/03/19   Aaron Boyden, MD  DULoxetine (CYMBALTA) 30 MG capsule Take 1 capsule (30 mg total) by mouth daily. 03/14/23   Aaron Boyden, MD  finasteride (PROSCAR) 5 MG tablet Take 1 tablet (5 mg total) by mouth daily. 03/14/23   Aaron Boyden, MD  fluticasone [facility]) 50 MCG/ACT nasal spray SPRAY 2 SPRAYS INTO EACH NOSTRIL EVERY DAY 10/29/21   Aaron Boyden, MD  furosemide (LASIX) 20 MG tablet Take 1 tablet (20 mg total) by mouth daily. 03/14/23   Aaron Boyden, MD  gabapentin (NEURONTIN) 300 MG capsule Take 2 capsules (600 mg total) by mouth at bedtime. 03/07/23   Aaron Boyden, MD  glucose blood (ACCU-CHEK AVIVA PLUS) test strip Use as instructed to check sugars daily E11.42 03/20/22   Aaron Boyden, MD  hydrochlorothiazide (HYDRODIURIL) 25 MG tablet Take 1 tablet (25 mg total) by mouth daily. 03/14/23   Aaron Boyden, MD  hydrocortisone 2.5 % cream Apply 1 Application topically 2 (two) times daily. 03/03/23   [provider]  ipratropium (ATROVENT) 0.06 % nasal spray Place 2 sprays into both nostrils 4 (four) times daily. 07/17/22   Shirlee Latch, [facility]  isosorbide mononitrate (IMDUR) 30 MG 24 hr tablet Take 1 tablet (30 mg total) by mouth daily. 02/28/23   Antonieta Iba, MD  ketoconazole (NIZORAL) 2 % cream Apply topically. 06/25/21   [provider]  losartan (COZAAR) 50 MG tablet Take 1 tablet (50 mg total) by mouth daily. 03/14/23   Aaron Boyden, MD  metFORMIN (GLUCOPHAGE) 500 MG tablet Take 1 tablet (500 mg total) by mouth 2 (two) times daily with a meal. 03/14/23   Aaron Boyden, MD  mirabegron ER (MYRBETRIQ) 25 MG TB24 tablet Take 1 tablet (25 mg total) by mouth daily. 09/03/22   Aaron Boyden, MD   mupirocin ointment (BACTROBAN) 2 % Apply 1 Application topically 2 (two) times daily. 03/03/23   [provider]  polyethylene glycol powder (GLYCOLAX/MIRALAX) 17 GM/SCOOP powder TAKE 8.5-17 G BY MOUTH DAILY AS NEEDED FOR MODERATE CONSTIPATION. 10/18/20   Aaron Boyden, MD  traZODone (DESYREL) 50 MG tablet Take 1.5 tablets (75 mg total) by mouth at bedtime. 03/14/23   Aaron Boyden, MD   Physical Exam: Vitals:   04/10/23 1300 04/10/23 1330 04/10/23 1400 04/10/23 1500  BP: 124/73 125/71 118/65   Pulse: 62 64 61   Resp: 13 20 18    Temp:      TempSrc:      SpO2: 97% 96% 99%   Weight:    78.9 kg   Constitutional: appears age appropriate, frail,  calm Eyes: PERRL, lids and  conjunctivae normal ENMT: Mucous membranes are moist. Posterior pharynx clear of any exudate or lesions. Age-appropriate dentition. Hearing appropriate.  Right medial ear with mild redness. Bilateral cone of light not visualized due to cerumen deposit and ear anatomy. B/L TM not fully visualized due to the same Neck: normal, supple, no masses, no thyromegaly Respiratory: clear to auscultation bilaterally, no wheezing, no crackles. Normal respiratory effort. No accessory muscle use.  Cardiovascular: Regular rate and rhythm, no murmurs / rubs / gallops. No extremity edema. 2+ pedal pulses. No carotid bruits.  Abdomen: no tenderness, no masses palpated, no hepatosplenomegaly. Bowel sounds positive.  Musculoskeletal: no clubbing / cyanosis. No joint deformity upper and lower extremities. Good ROM, no contractures, no atrophy. Normal muscle tone.  Skin: no rashes, lesions, ulcers. No induration Neurologic: Sensation intact. Strength 5/5 in all 4. + right sided facial droop Psychiatric: Normal judgment and insight. Alert and oriented x 3. Normal mood.   EKG: independently reviewed, showing sinus rhythm with rate of 71, QTc 474  Chest x-ray on Admission: I personally reviewed and I agree with radiologist reading as  below.  CT HEAD CODE STROKE WO CONTRAST Result Date: 04/10/2023 CLINICAL DATA:  Code stroke. Provided history: Neuro deficit, acute, stroke suspected. EXAM: CT HEAD WITHOUT CONTRAST TECHNIQUE: Contiguous axial images were obtained from the base of the skull through the vertex without intravenous contrast. RADIATION DOSE REDUCTION: This exam was performed according to the departmental dose-optimization program which includes automated exposure control, adjustment of the mA and/or kV according to patient size and/or use of iterative reconstruction technique. COMPARISON:  Noncontrast head CT 06/18/2022. FINDINGS: Brain: Generalized cerebral atrophy. Commensurate prominence of the ventricles and sulci. Patchy and ill-defined hypoattenuation within the cerebral white matter, nonspecific but compatible with moderate chronic small vessel ischemic disease. Small chronic infarcts within the cerebellar vermis and right cerebellar hemisphere. There is no acute intracranial hemorrhage. No demarcated cortical infarct. No extra-axial fluid collection. No evidence of an intracranial mass. No midline shift. Vascular: No hyperdense vessel.  Atherosclerotic calcifications. Skull: No calvarial fracture or aggressive osseous lesion. Sinuses/Orbits: No mass or acute finding within the imaged orbits. No significant paranasal sinus disease at the imaged levels. ASPECTS [facility] Stroke Program Early CT Score) - Ganglionic level infarction (caudate, lentiform nuclei, internal capsule, insula, M1-M3 cortex): 7 - Supraganglionic infarction (M4-M6 cortex): 3 Total score (0-10 with 10 being normal): 10 No evidence of an acute intracranial abnormality. These results were communicated to Dr. Otelia Limes at 12:40 pmon 2/20/2025by text page via the [facility] messaging system. IMPRESSION: 1.  No evidence of an acute intracranial abnormality. 2. Parenchymal atrophy and chronic small vessel ischemic disease. 3. Small chronic infarcts within the cerebellar  vermis and right cerebellar hemisphere. Electronically Signed   By: Jackey Loge D.O.   On: 04/10/2023 12:40   Labs on Admission: I have personally reviewed following labs  CBC: Recent Labs  Lab 04/10/23 1213  WBC 7.7  NEUTROABS 4.8  HGB 14.3  HCT 41.3  MCV 87.3  PLT 174   Basic Metabolic Panel: Recent Labs  Lab 04/10/23 1213  NA 135  K 3.3*  CL 96*  CO2 26  GLUCOSE 198*  BUN 21  CREATININE 1.09  CALCIUM 9.7   GFR: Estimated Creatinine Clearance: 45.4 mL/min (by C-G formula based on SCr of 1.09 mg/dL).  Liver Function Tests: Recent Labs  Lab 04/10/23 1213  AST 29  ALT 35  ALKPHOS 59  BILITOT 0.9  PROT 7.3  ALBUMIN 4.1   Coagulation Profile:  Recent Labs  Lab 04/10/23 1213  INR 1.1   CBG: Recent Labs  Lab 04/10/23 1216  GLUCAP 195*   Urine analysis:    Component Value Date/Time   COLORURINE YELLOW (A) 06/28/2021 0059   APPEARANCEUR CLEAR (A) 06/28/2021 0059   LABSPEC 1.021 06/28/2021 0059   PHURINE 6.0 06/28/2021 0059   GLUCOSEU 50 (A) 06/28/2021 0059   HGBUR NEGATIVE 06/28/2021 0059   HGBUR trace-intact 04/13/2010 0811   BILIRUBINUR NEGATIVE 06/28/2021 0059   BILIRUBINUR negative 03/11/2017 1155   KETONESUR NEGATIVE 06/28/2021 0059   PROTEINUR 30 (A) 06/28/2021 0059   UROBILINOGEN 0.2 03/11/2017 1155   UROBILINOGEN 0.2 04/13/2010 0811   NITRITE NEGATIVE 06/28/2021 0059   LEUKOCYTESUR NEGATIVE 06/28/2021 0059   This document was prepared using Dragon Voice Recognition software and may include unintentional dictation errors.  Dr. Sedalia Muta [facility]  If 7PM-7AM, please contact overnight-coverage provider If 7AM-7PM, please contact day attending provider [email or web address]  04/10/2023, 3:28 PM

## 2023-04-10 NOTE — Progress Notes (Signed)
Pt off of unit for MRI.

## 2023-04-10 NOTE — Assessment & Plan Note (Addendum)
 Augmentin 875-125 mg p.o. every 12 hours, 5 days ordered but

## 2023-04-10 NOTE — Assessment & Plan Note (Signed)
 Aspirin 81 mg daily, clopidogrel 75 mg daily resumed on admission

## 2023-04-10 NOTE — ED Notes (Signed)
CODE  STROKE  CALLED  TO  Aaron Stafford

## 2023-04-10 NOTE — Hospital Course (Signed)
 Aaron Stafford is a 89 year old male with history of hyperlipidemia, depression, anxiety, neuropathy, non-insulin-dependent diabetes mellitus, hypertension, BPH, who presents emergency department for chief concerns of right-sided facial droop at approximately 4 PM yesterday on 04/09/2023.  Vitals in the ED showed temperature of 98.4, respiration rate 14, heart rate 75, blood pressure 165/85, SpO2 98% on room air.  Serum sodium is 135, potassium 3.3, chloride 96, bicarb 26, BUN of 21, serum creatinine 1.09, EGFR greater than 60, nonfasting blood glucose 198, WBC 7.7, hemoglobin 14.3, platelets of 174.  EtOH level was negative.  ED treatment: None.

## 2023-04-10 NOTE — Plan of Care (Addendum)
 Patient admitted from ED  in stable condition.  He is alert and oriented X 4. He is in NPO, till formal speech evaluation.oral medication not given as  MD order. Plan of care ongoing.  Problem: Education: Goal: Ability to describe self-care measures that may prevent or decrease complications (Diabetes Survival Skills Education) will improve Outcome: Progressing Goal: Individualized Educational Video(s) Outcome: Progressing   Problem: Coping: Goal: Ability to adjust to condition or change in health will improve Outcome: Progressing   Problem: Health Behavior/Discharge Planning: Goal: Ability to identify and utilize available resources and services will improve Outcome: Progressing Goal: Ability to manage health-related needs will improve Outcome: Progressing   Problem: Metabolic: Goal: Ability to maintain appropriate glucose levels will improve Outcome: Progressing   Problem: Tissue Perfusion: Goal: Adequacy of tissue perfusion will improve Outcome: Progressing   Problem: Education: Goal: Knowledge of disease or condition will improve Outcome: Progressing Goal: Knowledge of secondary prevention will improve (MUST DOCUMENT ALL) Outcome: Progressing Goal: Knowledge of patient specific risk factors will improve (DELETE if not current risk factor) Outcome: Progressing

## 2023-04-10 NOTE — Assessment & Plan Note (Addendum)
 Home metformin will not be resumed on admission Insulin SSI with at bedtime coverage ordered Home gabapentin 600 mg nightly resumed Goal inpatient blood glucose level is 140-180

## 2023-04-10 NOTE — Assessment & Plan Note (Signed)
 Fall precautions.

## 2023-04-10 NOTE — ED Provider Notes (Signed)
 [facility] Provider Note    Event Date/Time   First MD Initiated Contact with Patient 04/10/23 1227     (approximate)   History   Chief Complaint: Code Stroke   HPI  Aaron Stafford is a 89 y.o. male with a history of CAD, hypertension, diabetes who was brought to the ED due to facial droop starting at about 4:00 PM yesterday afternoon.  This has been persistent.  Also endorses some right-sided weakness.  No trauma.  No fever.  No neck pain.  On Eliquis          Physical Exam   Triage Vital Signs: ED Triage Vitals [04/10/23 1217]  Encounter Vitals Group     BP (!) 165/85     Systolic BP Percentile      Diastolic BP Percentile      Pulse Rate 75     Resp 14     Temp 98.4 F (36.9 C)     Temp Source Oral     SpO2 98 %     Weight      Height      Head Circumference      Peak Flow      Pain Score      Pain Loc      Pain Education      Exclude from Growth Chart     Most recent vital signs: Vitals:   04/10/23 1330 04/10/23 1400  BP: 125/71 118/65  Pulse: 64 61  Resp: 20 18  Temp:    SpO2: 96% 99%    General: Awake, no distress.  CV:  Good peripheral perfusion.  Regular rate, normal distal pulses Resp:  Normal effort.  Clear to auscultation, strong voice Abd:  No distention.  Soft nontender Other:  Right facial droop, dysarthria.  Mild right leg weakness.  NIH stroke scale 3   ED Results / Procedures / Treatments   Labs (all labs ordered are listed, but only abnormal results are displayed) Labs Reviewed  APTT - Abnormal; Notable for the following components:      Result Value   aPTT 38 (*)    All other components within normal limits  COMPREHENSIVE METABOLIC PANEL - Abnormal; Notable for the following components:   Potassium 3.3 (*)    Chloride 96 (*)    Glucose, Bld 198 (*)    All other components within normal limits  CBG MONITORING, ED - Abnormal; Notable for the following components:   Glucose-Capillary 195 (*)     All other components within normal limits  PROTIME-INR  CBC  DIFFERENTIAL  ETHANOL  I-STAT CREATININE, ED     EKG Interpreted by me Sinus rhythm rate of 71, left axis, right bundle branch block.  No acute ischemic changes.   RADIOLOGY CT head interpreted by me, no obvious infarct, no intracranial hemorrhage.  Radiology report reviewed   PROCEDURES:  Procedures   MEDICATIONS ORDERED IN ED: Medications   stroke: early stages of recovery book (has no administration in time range)  acetaminophen (TYLENOL) tablet 650 mg (has no administration in time range)    Or  acetaminophen (TYLENOL) 160 MG/5ML solution 650 mg (has no administration in time range)    Or  acetaminophen (TYLENOL) suppository 650 mg (has no administration in time range)  senna-docusate (Senokot-S) tablet 1 tablet (has no administration in time range)  hydrALAZINE (APRESOLINE) injection 5 mg (has no administration in time range)  insulin aspart (novoLOG) injection 0-5 Units (has no administration  in time range)  insulin aspart (novoLOG) injection 0-15 Units (has no administration in time range)  melatonin tablet 5 mg (has no administration in time range)  amoxicillin-clavulanate (AUGMENTIN) 875-125 MG per tablet 1 tablet (has no administration in time range)  aspirin EC tablet 81 mg (has no administration in time range)  atorvastatin (LIPITOR) tablet 40 mg (has no administration in time range)  DULoxetine (CYMBALTA) DR capsule 30 mg (has no administration in time range)  traZODone (DESYREL) tablet 75 mg (has no administration in time range)  polyethylene glycol (MIRALAX / GLYCOLAX) packet 8.5-17 g (has no administration in time range)  clopidogrel (PLAVIX) tablet 75 mg (has no administration in time range)  gabapentin (NEURONTIN) capsule 600 mg (has no administration in time range)  fluticasone (FLONASE) 50 MCG/ACT nasal spray 2 spray (has no administration in time range)  ipratropium-albuterol (DUONEB)  0.5-2.5 (3) MG/3ML nebulizer solution 3 mL (has no administration in time range)  sodium chloride flush (NS) 0.9 % injection 3 mL (3 mLs Intravenous Given 04/10/23 1246)     IMPRESSION / MDM / ASSESSMENT AND PLAN / ED COURSE  I reviewed the triage vital signs and the nursing notes.  DDx: Ischemic stroke, intracranial hemorrhage, intracranial mass, electrolyte derangement, dehydration, AKI  Patient's presentation is most consistent with acute presentation with potential threat to life or bodily function.  Patient presents with neurologic deficits worrisome for acute ischemic stroke.  Vital signs unremarkable.  Airway intact.  CT head and initial labs unremarkable.  Code stroke was initiated on arrival, neuro team evaluated patient immediately.  Not a candidate for TNK or IR.  Case discussed with hospitalist for further stroke workup and management.       FINAL CLINICAL IMPRESSION(S) / ED DIAGNOSES   Final diagnoses:  Cerebrovascular accident (CVA), unspecified mechanism (HCC)     Rx / DC Orders   ED Discharge Orders     None        Note:  This document was prepared using Dragon voice recognition software and may include unintentional dictation errors.   Sharman Cheek, MD 04/10/23 [phone]

## 2023-04-11 ENCOUNTER — Observation Stay (HOSPITAL_BASED_OUTPATIENT_CLINIC_OR_DEPARTMENT_OTHER)
Admit: 2023-04-11 | Discharge: 2023-04-11 | Disposition: A | Discharge status: Home / Self Care | Payer: Medicare (Managed Care) | Attending: Internal Medicine | Admitting: Internal Medicine

## 2023-04-11 DIAGNOSIS — G459 Transient cerebral ischemic attack, unspecified: Secondary | ICD-10-CM

## 2023-04-11 DIAGNOSIS — G510 Bell's palsy: Secondary | ICD-10-CM

## 2023-04-11 DIAGNOSIS — H6691 Otitis media, unspecified, right ear: Secondary | ICD-10-CM

## 2023-04-11 DIAGNOSIS — R296 Repeated falls: Secondary | ICD-10-CM

## 2023-04-11 LAB — LIPID PANEL
Cholesterol: 105 mg/dL (ref 0–200)
HDL: 31 mg/dL — ABNORMAL LOW (ref >40)
LDL Cholesterol: 42 mg/dL (ref 0–99)
Total CHOL/HDL Ratio: 3.4 {ratio}
Triglycerides: 162 mg/dL — ABNORMAL HIGH (ref <150)
VLDL: 32 mg/dL (ref 0–40)

## 2023-04-11 LAB — BASIC METABOLIC PANEL
Anion gap: 9 (ref 5–15)
BUN: 23 mg/dL (ref 8–23)
CO2: 30 mmol/L (ref 22–32)
Calcium: 9.8 mg/dL (ref 8.9–10.3)
Chloride: 100 mmol/L (ref 98–111)
Creatinine, Ser: 1.04 mg/dL (ref 0.61–1.24)
GFR, Estimated: >60 mL/min (ref >60)
Glucose, Bld: 145 mg/dL — ABNORMAL HIGH (ref 70–99)
Potassium: 4.1 mmol/L (ref 3.5–5.1)
Sodium: 139 mmol/L (ref 135–145)

## 2023-04-11 LAB — ECHOCARDIOGRAM COMPLETE
AR max vel: 3.04 cm2
AV Area VTI: 2.98 cm2
AV Area mean vel: 3 cm2
AV Mean grad: 2 mm[Hg]
AV Peak grad: 3.1 mm[Hg]
Ao pk vel: 0.88 m/s
Area-P 1/2: 3.74 cm2
Height: 67.5 in
MV VTI: 2.3 cm2
S' Lateral: 3.6 cm
Weight: 2783.09 [oz_av]

## 2023-04-11 LAB — MAGNESIUM: Magnesium: 2.1 mg/dL (ref 1.7–2.4)

## 2023-04-11 LAB — GLUCOSE, CAPILLARY: Glucose-Capillary: 141 mg/dL — ABNORMAL HIGH (ref 70–99)

## 2023-04-11 MED ORDER — PREDNISONE 20 MG PO TABS: ORAL_TABLET | ORAL | 0 refills | Status: AC

## 2023-04-11 MED ORDER — PREDNISONE 50 MG PO TABS
60.0000 mg | ORAL_TABLET | Freq: Every day | ORAL | Status: DC
  Administered 2023-04-11: 60 mg via ORAL
  Filled 2023-04-11: qty 1

## 2023-04-11 MED ORDER — VALACYCLOVIR HCL 500 MG PO TABS
1000.0000 mg | ORAL_TABLET | Freq: Three times a day (TID) | ORAL | Status: DC
  Administered 2023-04-11: 1000 mg via ORAL
  Filled 2023-04-11 (×2): qty 2

## 2023-04-11 MED ORDER — AMOXICILLIN-POT CLAVULANATE 875-125 MG PO TABS: 1.0000 | ORAL_TABLET | Freq: Two times a day (BID) | ORAL | 0 refills | Status: AC

## 2023-04-11 MED ORDER — VALACYCLOVIR HCL 1 G PO TABS: 1000.0000 mg | ORAL_TABLET | Freq: Three times a day (TID) | ORAL | 0 refills | Status: AC

## 2023-04-11 NOTE — Progress Notes (Signed)
 OT Cancellation Note  Patient Details Name: Aaron Stafford MRN: [id number] DOB: [birth year]   Cancelled Treatment:    Reason Eval/Treat Not Completed: OT screened, no needs identified, will sign off. Pt with facial droop, MRI negative; Bell's Palsy dx. OT stopped by to talk with pt; he states arms and legs are working fine, he's been up to the bathroom independently in the hospital. OT will sign off.   Alvester Morin 04/11/2023, 9:02 AM

## 2023-04-11 NOTE — Evaluation (Signed)
 Clinical/Bedside Swallow Evaluation Patient Details  Name: Aaron Stafford MRN: [id number] Date of Birth: [birth year]  Today's Date: 04/11/2023 Time: SLP Start Time (ACUTE ONLY): 0750 SLP Stop Time (ACUTE ONLY): 0850 SLP Time Calculation (min) (ACUTE ONLY): 60 min  Past Medical History:  Past Medical History:  Diagnosis Date   CAD (coronary artery disease)    a. 1995 s/p CABG x 4 (LIMA->LAD, VG->OM1->OM2, VG->RCA); b. 07/2009 PCI: LM 60, LAD 100ost, LCX 100p, RCA 100, LIMA->LAD atretic, VG->OM1->OM2 patent w/ collats to dLAD, VG->RCA 95 (3.5x15 Vision BMS). EF 55%; c. 02/2021 MV: EF 30% (50-55% by echo), large inferolateral scar w/ HK. No ischemia.   Chronic rhinitis    ENT [place]   COVID-19 virus infection 09/2019   Diabetes mellitus type II    Diastolic dysfunction    a. 03/2021 Echo: EF 50-55%, no rwma, GrI DD. RVSP 35.80mmHg. Mild MR.   Diverticulosis 06/2011   by CT and colonoscopy   Fatty liver 06/2011   by CT   GERD (gastroesophageal reflux disease)    History of BPH    History of shingles    HLD (hyperlipidemia)    HTN (hypertension)    Ischemic heart disease    Melanoma (HCC) 12/2015   L forearm, scalp   MI (myocardial infarction) (HCC) 1995   Shingles rash 09/26/2012   Sialadenitis 05/01/2017   Past Surgical History:  Past Surgical History:  Procedure Laterality Date   ANKLE SURGERY  03/2002   Left ankle fracture   CARDIAC CATHETERIZATION     CHOLECYSTECTOMY  1985   COLONOSCOPY     diverticulosis, tortuous colon with looping Marva Panda)   CORONARY ARTERY BYPASS GRAFT  05/1993   x6 MI Attemped angioplasty   CORONARY STENT PLACEMENT  07/25/2009   Multi-Link VISION Cobalt chromium    CYSTOSCOPY  10/03/08   Normal-Dr. Lonna Cobb   INGUINAL HERNIA REPAIR  11/2000   and ventral hernia repair   HPI:  Pt is a 89 y.o. male with a PMHx of Bell's palsy approximately 10 years ago (the patient does not recall which side), CAD s/p CABG and stent placment, DM, diastolic  dysfunction, diverticulosis, fatty liver, GERD, BPH, shingles, HLD, HTN, ischemic heart disease and melanoma who presents to the ED as a Code Stroke after new onset of right facial droop. Symptoms began yesterday with leakage of fluid from the right side of his mouth and right lower quadrant facial weakness. This morning the symptoms were still present, but he also had difficulty with fully closing his right eye. He denies any limb numbness, limb weakness, vision changes or aphasia. Does not endorse any ataxia or gait difficulty. No right ear pain or drainage. No loss of taste. No hyperacusis.   MRI: No acute intracranial abnormality.  2. Chronic small vessel ischemia and volume loss.  CXR: atelectasis.    Assessment / Plan / Recommendation  Clinical Impression   Pt seen for BSE and informal Dysarthria assessment at Bedside this morning. Pt is currently NPO since admit d/t concern for swallowing issues per MD. Pt awake, verbal and engaged in conversation appropriately. A/O x4. Followed all instructions. Noted decreased R labial tone; min dysarthria. Family arrived toward end of session.  On RA, afebrile. WBC WFL   Pt appears to present w/ functional oropharyngeal phase swallowing w/ No pharyngeal phase dysphagia noted w/ no neuromuscular deficits noted. Pt does exhibit decreased R facial/labial tone d/t the impact of Bell's Palsy (suspected dx per MD report this morning. Noted MRI Negative.).  Pt has had Bell's Palsy prior ~10 years ago w/ same presentation on the R side of face per his report. Pt consumed po trials w/ No overt, clinical s/s of aspiration during po trials.  Pt appears at reduced risk for aspiration following general aspiration precautions and monitoring bolus size during bites/sips.  During po trials, pt consumed all consistencies w/ no overt coughing, decline in vocal quality, or change in respiratory presentation during/post trials. No decline in O2 sats. Oral phase appeared grossly [facility]  w/ timely bolus management, mastication, and control of bolus propulsion for A-P transfer for swallowing. Oral clearing achieved w/ all trial consistencies -- moistened, soft foods given. No oral/bolus leakage during po intake -- pt took Small, single sips and bites for best oral control. OM Exam appeared [facility] for lingual strength/ROM w/ no unilateral weakness noted. Decreased R labial tone/symmetry but ROM was grossly functional during excursion/laughing. Speech fully intelligible. Pt fed self.  Recommend a Regular consistency diet w/ well-Cut meats, moistened foods; Thin liquids -- monitor any straw use, and pt should use Small single sips when drinking. Recommend general aspiration precautions, Pills WHOLE in Puree for easier swallowing IF needed.  Education given on Pills in Puree; food consistencies and easy to eat options; general aspiration precautions to pt and Family. NSG to reconsult if any new needs arise. MD/NSG updated, agreed.  Encouraged pt to find ways to monitor/manage stressors at home.  RE: R Facial decreased tone and Mild Dysarthria:  Per MD, pt presents w/ dx'd Bell's Palsy and is being treated for such w/ medications including a steroid ordered. Pt appears to present w/ slight-min Dysarthria d/t the impact of the decreased R labial tone/symmetry impacting full precision of certain speech sounds(labial sounds in particular). His verbal communication at the conversation level was grossly [facility] w/ full intelligibility of the conversation/intent. Encouraged pt to slow down and slightly emphasize those speech sounds w/ good breath support during conversation. His articulation/precision of speech improved slightly. Education given on the nature of Bell's Palsy and that added/increased stress of OM exercises is not recommended. Encouraged pt instead to take his time w/ conversation identifying ways to practice good verbal communication and reduce impact of environmental impact. Encouraged pt to  find ways to monitor/manage stressors at home and to rest and not overdo post D/C home allowing himself Time to heal. Recommended f/u w/ PCP for further.  No expressive or receptive aphasia noted, no cognitive deficits noted. No further skilled ST services indicated during admit. NSG/MD updated. Pt and Family agreed.  SLP Visit Diagnosis: Dysphagia, unspecified (R13.10) (impact of Bell's Palsy on R labial tone)    Aspiration Risk   (reduced following general aspiration precautions)    Diet Recommendation   Thin;Age appropriate regular (small sips, bites)  Medication Administration: Whole meds with puree (if needed for ease of swallowing)    Other  Recommendations Recommended Consults:  (Dietician) Oral Care Recommendations: Oral care BID;Oral care before and after PO;Patient independent with oral care    Recommendations for follow up therapy are one component of a multi-disciplinary discharge planning process, led by the attending physician.  Recommendations may be updated based on patient status, additional functional criteria and insurance authorization.  Follow up Recommendations No SLP follow up      Assistance Recommended at Discharge  PRN  Functional Status Assessment Patient has had a recent decline in their functional status and demonstrates the ability to make significant improvements in function in a reasonable and predictable amount of  time.  Frequency and Duration  (n/a)   (n/a)       Prognosis Prognosis for improved oropharyngeal function: Good Barriers to Reach Goals:  (n/a) Barriers/Prognosis Comment: impact of Bell's Palsy on R labial tone      Swallow Study   General Date of Onset: 04/10/23 HPI: Pt is a 89 y.o. male with a PMHx of Bell's palsy approximately 10 years ago (the patient does not recall which side), CAD s/p CABG and stent placment, DM, diastolic dysfunction, diverticulosis, fatty liver, GERD, BPH, shingles, HLD, HTN, ischemic heart disease and melanoma  who presents to the ED as a Code Stroke after new onset of right facial droop. Symptoms began yesterday with leakage of fluid from the right side of his mouth and right lower quadrant facial weakness. This morning the symptoms were still present, but he also had difficulty with fully closing his right eye. He denies any limb numbness, limb weakness, vision changes or aphasia. Does not endorse any ataxia or gait difficulty. No right ear pain or drainage. No loss of taste. No hyperacusis.   MRI: No acute intracranial abnormality.  2. Chronic small vessel ischemia and volume loss.  CXR: atelectasis. Type of Study: Bedside Swallow Evaluation (and Dysarthria) Previous Swallow Assessment: none Diet Prior to this Study: NPO (regular at home) Temperature Spikes Noted: No (wbc 7.7) Respiratory Status: Room air History of Recent Intubation: No Behavior/Cognition: Alert;Cooperative;Pleasant mood (x4) Oral Cavity Assessment: Within Functional Limits Oral Care Completed by SLP: Yes Oral Cavity - Dentition: Adequate natural dentition Vision: Functional for self-feeding Self-Feeding Abilities: Able to feed self Patient Positioning: Upright in bed Baseline Vocal Quality: Normal Volitional Cough: Strong Volitional Swallow: Able to elicit    Oral/Motor/Sensory Function Overall Oral Motor/Sensory Function: Mild impairment Facial ROM: Reduced right;Suspected CN VII (facial) dysfunction Facial Symmetry: Abnormal symmetry right;Suspected CN VII (facial) dysfunction Facial Strength: Reduced right;Suspected CN VII (facial) dysfunction (grossly wfl) Facial Sensation: Within Functional Limits Lingual ROM: Within Functional Limits Lingual Symmetry: Within Functional Limits Lingual Strength: Within Functional Limits Lingual Sensation: Within Functional Limits Velum: Within Functional Limits Mandible: Within Functional Limits   Ice Chips Ice chips: Within functional limits Presentation: Spoon (fed; 3 trials)    Thin Liquid Thin Liquid: Within functional limits Presentation: Cup;Self Fed (~4 ozs) Other Comments: no leakage occurred w/ small sips    Nectar Thick Nectar Thick Liquid: Not tested   Honey Thick Honey Thick Liquid: Not tested   Puree Puree: Within functional limits Presentation: Self Fed;Spoon (10 trials)   Solid     Solid: Within functional limits Presentation: Self Fed (8+ trials)        Jerilynn Som, MS, CCC-SLP Speech Language Pathologist Rehab Services; [facility] [place] [phone] (ascom) Cohen Doleman 04/11/2023,2:36 PM

## 2023-04-11 NOTE — Discharge Summary (Signed)
 Physician Discharge Summary   Patient: Aaron Stafford MRN: [id number] DOB: 1935-01-15  Admit date:     04/10/2023  Discharge date: 04/11/23  Discharge Physician: Marrion Coy   PCP: Eustaquio Boyden, MD   Recommendations at discharge:   Follow-up with PCP in 1 week.  Discharge Diagnoses: Principal Problem:   Stroke-like symptom Active Problems:   S/P CABG (coronary artery bypass graft)   History of cerebellar stroke   Recurrent falls   CAD (coronary artery disease)   HTN (hypertension)   Type 2 diabetes mellitus with diabetic neuropathy, unspecified (HCC)   Irritable bowel syndrome   Insomnia   Hyperlipidemia associated with type 2 diabetes mellitus (HCC)   GERD (gastroesophageal reflux disease)   BPH with obstruction/lower urinary tract symptoms   Right otitis media Bell's palsy, Resolved Problems:   * No resolved hospital problems. * Hypokalemia. Hospital Course: [name] is a 89 year old male with history of hyperlipidemia, depression, anxiety, neuropathy, non-insulin-dependent diabetes mellitus, hypertension, BPH, who presents emergency department for chief concerns of right-sided facial droop at approximately 4 PM yesterday on 04/09/2023.  Patient is seen by neurology, condition consistent with Bell's palsy.  Patient had a brain MRI did not show any acute changes, stroke rule out.  At this point, patient medically stable for discharge.   Assessment and Plan: * Stroke-like symptom with Bell's palsy. Stroke ruled out, Patient condition is consistent with Bell's palsy, discussed with neurology, patient be treated with valacyclovir, steroid taper.  Follow-up with PCP as outpatient.  Recurrent falls Home physical therapy.  S/P CABG (coronary artery bypass graft) Aspirin 81 mg daily, clopidogrel 75 mg daily resumed on admission  Type 2 diabetes mellitus with diabetic neuropathy, unspecified (HCC) Resume home treatment, follow-up with PCP to monitor glucose,  patient need to be monitored on glucose due to steroids.  HTN (hypertension) Resume treatment, hold HCTZ due to hypokalemia.  Hyperlipidemia associated with type 2 diabetes mellitus (HCC) Home atorvastatin 40 mg daily resumed  Insomnia Follow-up with PCP as outpatient.  Right otitis media Augmentin 875-125 mg p.o. every 12 hours,        Consultants: Neurology. Procedures performed: None  Disposition: Home health Diet recommendation:  Discharge Diet Orders (From admission, onward)     Start     Ordered   04/11/23 0000  Diet - low sodium heart healthy        04/11/23 0957           Cardiac diet DISCHARGE MEDICATION: Allergies as of 04/11/2023       Reactions   Morphine Sulfate    REACTION: Hallucinations   Nitroglycerin    REACTION: Decreased BP   Contrast Media [iodinated Contrast Media] Rash   Severe hives        Medication List     STOP taking these medications    benzonatate 200 MG capsule Commonly known as: TESSALON   hydrochlorothiazide 25 MG tablet Commonly known as: HYDRODIURIL       TAKE these medications    Accu-Chek Aviva Plus test strip Generic drug: glucose blood Use as instructed to check sugars daily E11.42   Accu-Chek Aviva Plus w/Device Kit Use as directed to check sugars daily. E11.42   Accu-Chek Softclix Lancets lancets Use as instructed to check sugars once daily E11.42   Alpha-Lipoic Acid 600 MG Caps Take 1 capsule (600 mg total) by mouth daily.   amoxicillin-clavulanate 875-125 MG tablet Commonly known as: AUGMENTIN Take 1 tablet by mouth every 12 (twelve) hours for  4 days.   aspirin EC 81 MG tablet Take 1 tablet (81 mg total) by mouth every Monday, Wednesday, and Friday. Swallow whole.   atorvastatin 40 MG tablet Commonly known as: LIPITOR Take 1 tablet (40 mg total) by mouth daily.   clopidogrel 75 MG tablet Commonly known as: PLAVIX Take 1 tablet (75 mg total) by mouth daily.   Cranberry 500 MG  Caps Take 2 capsules (1,000 mg total) by mouth daily.   DULoxetine 30 MG capsule Commonly known as: Cymbalta Take 1 capsule (30 mg total) by mouth daily.   finasteride 5 MG tablet Commonly known as: PROSCAR Take 1 tablet (5 mg total) by mouth daily.   fluticasone 50 MCG/ACT nasal spray Commonly known as: FLONASE SPRAY 2 SPRAYS INTO EACH NOSTRIL EVERY DAY   furosemide 20 MG tablet Commonly known as: LASIX Take 1 tablet (20 mg total) by mouth daily.   gabapentin 300 MG capsule Commonly known as: NEURONTIN Take 2 capsules (600 mg total) by mouth at bedtime.   hydrocortisone 2.5 % cream Apply 1 Application topically 2 (two) times daily.   ipratropium 0.06 % nasal spray Commonly known as: ATROVENT Place 2 sprays into both nostrils 4 (four) times daily.   isosorbide mononitrate 30 MG 24 hr tablet Commonly known as: IMDUR Take 1 tablet (30 mg total) by mouth daily.   ketoconazole 2 % cream Commonly known as: NIZORAL Apply topically.   losartan 50 MG tablet Commonly known as: COZAAR Take 1 tablet (50 mg total) by mouth daily.   metFORMIN 500 MG tablet Commonly known as: GLUCOPHAGE Take 1 tablet (500 mg total) by mouth 2 (two) times daily with a meal.   mirabegron ER 25 MG Tb24 tablet Commonly known as: Myrbetriq Take 1 tablet (25 mg total) by mouth daily.   mupirocin ointment 2 % Commonly known as: BACTROBAN Apply 1 Application topically 2 (two) times daily.   polyethylene glycol powder 17 GM/SCOOP powder Commonly known as: GLYCOLAX/MIRALAX TAKE 8.5-17 G BY MOUTH DAILY AS NEEDED FOR MODERATE CONSTIPATION.   predniSONE 20 MG tablet Commonly known as: DELTASONE Take 3 tablets (60 mg total) by mouth daily with breakfast for 6 days, THEN 2.5 tablets (50 mg total) daily with breakfast for 1 day, THEN 2 tablets (40 mg total) daily with breakfast for 1 day, THEN 1.5 tablets (30 mg total) daily with breakfast for 1 day, THEN 1 tablet (20 mg total) daily with breakfast for  1 day, THEN 0.5 tablets (10 mg total) daily with breakfast for 1 day. Start taking on: April 11, 2023   traZODone 50 MG tablet Commonly known as: DESYREL Take 1.5 tablets (75 mg total) by mouth at bedtime.   valACYclovir 1000 MG tablet Commonly known as: VALTREX Take 1 tablet (1,000 mg total) by mouth 3 (three) times daily for 7 days.        Follow-up Information     Eustaquio Boyden, MD Follow up in 1 week(s).   Specialty: Family Medicine Contact information: [address] [place] [address] [phone]                Discharge Exam: Ceasar Mons Weights   04/10/23 1500  Weight: 78.9 kg   General exam: Appears calm and comfortable  Respiratory system: Clear to auscultation. Respiratory effort normal. Cardiovascular system: S1 & S2 heard, RRR. No JVD, murmurs, rubs, gallops or clicks. No pedal edema. Gastrointestinal system: Abdomen is nondistended, soft and nontender. No organomegaly or masses felt. Normal bowel sounds heard.  Central nervous system: Alert and oriented.  Right facial droop. Extremities: Symmetric 5 x 5 power. Skin: No rashes, lesions or ulcers Psychiatry: Judgement and insight appear normal. Mood & affect appropriate.    Condition at discharge: good  The results of significant diagnostics from this hospitalization (including imaging, microbiology, ancillary and laboratory) are listed below for reference.   Imaging Studies: MR BRAIN WO CONTRAST Result Date: 04/10/2023 CLINICAL DATA:  Acute neurologic deficit EXAM: MRI HEAD WITHOUT CONTRAST TECHNIQUE: Multiplanar, multiecho pulse sequences of the brain and surrounding structures were obtained without intravenous contrast. COMPARISON:  06/27/2021 FINDINGS: Brain: No acute infarct, mass effect or extra-axial collection. No acute or chronic hemorrhage. There is multifocal hyperintense T2-weighted signal within the white matter. Generalized volume loss. The midline structures are normal. There  are bilateral cerebellar small vessel infarcts, chronic. Vascular: Unchanged irregular flow void of the left vertebral artery. Skull and upper cervical spine: Normal calvarium and skull base. Visualized upper cervical spine and soft tissues are normal. Sinuses/Orbits:Right maxillary sinus mucosal thickening. Normal orbits. IMPRESSION: 1. No acute intracranial abnormality. 2. Chronic small vessel ischemia and volume loss. Electronically Signed   By: Deatra Robinson M.D.   On: 04/10/2023 21:26   DG Chest Port 1 View Result Date: 04/10/2023 CLINICAL DATA:  Stroke-like symptoms. Slurred speech and facial drooping. EXAM: PORTABLE CHEST 1 VIEW COMPARISON:  Radiographs 07/17/2022 and 06/18/2022. FINDINGS: 1525 hours. Stable mild cardiac enlargement status post median sternotomy and CABG. There is aortic atherosclerosis. Mild atelectasis at both lung bases. No edema, confluent airspace disease, pneumothorax or significant pleural effusion. The bones appear unchanged. IMPRESSION: Mild bibasilar atelectasis. No evidence of acute cardiopulmonary process. Electronically Signed   By: Carey Bullocks M.D.   On: 04/10/2023 15:43   CT HEAD CODE STROKE WO CONTRAST Result Date: 04/10/2023 CLINICAL DATA:  Code stroke. Provided history: Neuro deficit, acute, stroke suspected. EXAM: CT HEAD WITHOUT CONTRAST TECHNIQUE: Contiguous axial images were obtained from the base of the skull through the vertex without intravenous contrast. RADIATION DOSE REDUCTION: This exam was performed according to the departmental dose-optimization program which includes automated exposure control, adjustment of the mA and/or kV according to patient size and/or use of iterative reconstruction technique. COMPARISON:  Noncontrast head CT 06/18/2022. FINDINGS: Brain: Generalized cerebral atrophy. Commensurate prominence of the ventricles and sulci. Patchy and ill-defined hypoattenuation within the cerebral white matter, nonspecific but compatible with  moderate chronic small vessel ischemic disease. Small chronic infarcts within the cerebellar vermis and right cerebellar hemisphere. There is no acute intracranial hemorrhage. No demarcated cortical infarct. No extra-axial fluid collection. No evidence of an intracranial mass. No midline shift. Vascular: No hyperdense vessel.  Atherosclerotic calcifications. Skull: No calvarial fracture or aggressive osseous lesion. Sinuses/Orbits: No mass or acute finding within the imaged orbits. No significant paranasal sinus disease at the imaged levels. ASPECTS [facility] [place] Stroke Program Early CT Score) - Ganglionic level infarction (caudate, lentiform nuclei, internal capsule, insula, M1-M3 cortex): 7 - Supraganglionic infarction (M4-M6 cortex): 3 Total score (0-10 with 10 being normal): 10 No evidence of an acute intracranial abnormality. These results were communicated to Dr. Otelia Limes at 12:40 pmon 2/20/2025by text page via the [facility] messaging system. IMPRESSION: 1.  No evidence of an acute intracranial abnormality. 2. Parenchymal atrophy and chronic small vessel ischemic disease. 3. Small chronic infarcts within the cerebellar vermis and right cerebellar hemisphere. Electronically Signed   By: Jackey Loge D.O.   On: 04/10/2023 12:40    Microbiology: Results for orders placed or performed during the  hospital encounter of 07/17/22  SARS Coronavirus 2 by RT PCR (hospital order, performed in [facility] hospital lab) *cepheid single result test* Anterior Nasal Swab     Status: None   Collection Time: 07/17/22 11:57 AM   Specimen: Anterior Nasal Swab  Result Value Ref Range Status   SARS Coronavirus 2 by RT PCR NEGATIVE NEGATIVE Final    Comment: (NOTE) SARS-CoV-2 target nucleic acids are NOT DETECTED.  The SARS-CoV-2 RNA is generally detectable in upper and lower respiratory specimens during the acute phase of infection. The lowest concentration of SARS-CoV-2 viral copies this assay can detect is 250 copies / mL. A  negative result does not preclude SARS-CoV-2 infection and should not be used as the sole basis for treatment or other patient management decisions.  A negative result may occur with improper specimen collection / handling, submission of specimen other than nasopharyngeal swab, presence of viral mutation(s) within the areas targeted by this assay, and inadequate number of viral copies (<250 copies / mL). A negative result must be combined with clinical observations, patient history, and epidemiological information.  Fact Sheet for Patients:   RoadLapTop.co.za  Fact Sheet for Healthcare Providers: http://kim-miller.com/  This test is not yet approved or  cleared by the [place] FDA and has been authorized for detection and/or diagnosis of SARS-CoV-2 by FDA under an Emergency Use Authorization (EUA).  This EUA will remain in effect (meaning this test can be used) for the duration of the COVID-19 declaration under Section 564(b)(1) of the Act, 21 U.S.C. section 360bbb-3(b)(1), unless the authorization is terminated or revoked sooner.  Performed at [facility], [address], [place], [place] [address]   Group A Strep by PCR     Status: None   Collection Time: 07/17/22 11:57 AM   Specimen: Anterior Nasal Swab; Sterile Swab  Result Value Ref Range Status   Group A Strep by PCR NOT DETECTED NOT DETECTED Final    Comment: Performed at [facility], [address], [place], [place] [address]    Labs: CBC: Recent Labs  Lab 04/10/23 1213  WBC 7.7  NEUTROABS 4.8  HGB 14.3  HCT 41.3  MCV 87.3  PLT 174   Basic Metabolic Panel: Recent Labs  Lab 04/10/23 1213 04/11/23 0849  NA 135 139  K 3.3* 4.1  CL 96* 100  CO2 26 30  GLUCOSE 198* 145*  BUN 21 23  CREATININE 1.09 1.04  CALCIUM 9.7 9.8  MG  --  2.1   Liver Function Tests: Recent Labs  Lab 04/10/23 1213  AST 29  ALT 35  ALKPHOS 59   BILITOT 0.9  PROT 7.3  ALBUMIN 4.1   CBG: Recent Labs  Lab 04/10/23 1216 04/10/23 1629 04/10/23 1959 04/11/23 0727  GLUCAP 195* 154* 108* 141*    Discharge time spent: greater than 30 minutes.  Signed: Marrion Coy, MD [facility] 04/11/2023

## 2023-04-11 NOTE — Progress Notes (Signed)
*  PRELIMINARY RESULTS* Echocardiogram 2D Echocardiogram has been performed.  Cristela Blue 04/11/2023, 9:49 AM

## 2023-04-11 NOTE — TOC CM/SW Note (Signed)
 Transition of Care [facility]) - Inpatient Brief Assessment   Patient Details  Name: Aaron Stafford MRN: [id number] Date of Birth: 21-Jul-1934  Transition of Care [facility] CM/SW Contact:    Allena Katz, LCSW Phone Number: 04/11/2023, 9:23 AM   Clinical Narrative:    Transition of Care Asessment: Insurance and Status: Insurance coverage has been reviewed Patient has primary care physician: Yes Home environment has been reviewed: lives with wife Prior level of function:: independent Prior/Current Home Services: No current home services Social Drivers of Health Review: SDOH reviewed no interventions necessary Readmission risk has been reviewed: Yes Transition of care needs: no transition of care needs at this time

## 2023-04-11 NOTE — Progress Notes (Signed)
 PT Cancellation Note  Patient Details Name: BUBBA VANBENSCHOTEN MRN: [id number] DOB: [birth year]   Cancelled Treatment:    Reason Eval/Treat Not Completed: PT screened, no needs identified, will sign off (No mobility issues at this time. Please re-consult if needed.)  Donna Bernard, PT, MPT  Ina Homes 04/11/2023, 10:57 AM

## 2023-04-16 ENCOUNTER — Ambulatory Visit (INDEPENDENT_AMBULATORY_CARE_PROVIDER_SITE_OTHER): Payer: Medicare (Managed Care) | Admitting: Family Medicine

## 2023-04-16 ENCOUNTER — Encounter: Payer: Self-pay | Admitting: Family Medicine

## 2023-04-16 VITALS — BP 136/64 | HR 58 | Temp 97.9°F | Ht 67.5 in | Wt 177.0 lb

## 2023-04-16 DIAGNOSIS — E1140 Type 2 diabetes mellitus with diabetic neuropathy, unspecified: Secondary | ICD-10-CM

## 2023-04-16 DIAGNOSIS — G510 Bell's palsy: Secondary | ICD-10-CM

## 2023-04-16 DIAGNOSIS — Z7984 Long term (current) use of oral hypoglycemic drugs: Secondary | ICD-10-CM | POA: Diagnosis not present

## 2023-04-16 DIAGNOSIS — H6691 Otitis media, unspecified, right ear: Secondary | ICD-10-CM

## 2023-04-16 LAB — GLUCOSE, POCT (MANUAL RESULT ENTRY): POC Glucose: 205 mg/dL — AB (ref 70–99)

## 2023-04-16 NOTE — Assessment & Plan Note (Signed)
 Discussed steroid -induced hyperglycemia.  Cbg today 205.  Continue metformin.  Will not add sulfonylurea  but I did ask him to monitor sugars at home and let me know if consistently >200 to consider while on prednisone.  Encouraged limiting dietary sugar/carbs during prednisone course.

## 2023-04-16 NOTE — Patient Instructions (Addendum)
 Check sugars regularly while on steroid taper. Caution the steroids can increase your sugar levels.  If needed, we may add diabetes medicine while you're on steroids. Increase water, limit sugar and carbs while on prednisone steroids.  Take one more day of 3 pills then start daily taper by 1/2 tablet each day until done.  Continue lubricating eye drops throughout the day as needed Tape right eyelid shut at night to help protect the eye.  Sugar checked today - overall ok.

## 2023-04-16 NOTE — Assessment & Plan Note (Addendum)
 Hospital records reviewed. Imaging negative for acute stroke. S/p neurology evaluation - consistent with R sided bell's palsy complicated by R acute otitis media.  He will complete prednisone taper and valacyclovir course.  Discussed eye care during sleep for incomplete lid closure.

## 2023-04-16 NOTE — Progress Notes (Signed)
 Ph: [phone] Fax: [phone]   Patient ID: [id number] Aaron Stafford, male    DOB: 17-Jul-1934, 89 y.o.   MRN: [id number]  This visit was conducted in person.  BP 136/64   Pulse (!) 58   Temp 97.9 F (36.6 C) (Oral)   Ht 5' 7.5" (1.715 m)   Wt 177 lb (80.3 kg)   SpO2 96%   BMI 27.31 kg/m    CC: hosp f/u visit  Subjective:   HPI: Aaron Stafford is a 89 y.o. male presenting on 04/16/2023 for Hospitalization Follow-up (Admitted on 04/10/23 at [facility], dx CVA.)   Aaron Stafford drove him here today.  I saw patient 02/2023 for diabetes follow up 03/07/2023. Seen 1 wk later with COVID infection treated with Paxlovid.   Recent hospitalization for R sided facial droop and slurred speech.  Hospital records reviewed. Med rec performed.  MRI negative for acute stroke, did show chronic small vessel ischemic disease with generalized volume loss.  Seen by neurology - thought most consistent with bell's palsy treated with valacyclovir and prednisone 12d taper (60mg ). He notes prednisone tends to knock him out.   Incidental R otitis media and R maxillary sinusitis treated with augmentin 5d course. He has completed this course.   DM - continue metformin 500mg  bid. Discussed expected steroid-hyperglycemia.  New glucometer but hasn't recently checked sugars.  Lab Results  Component Value Date   HGBA1C 7.1 (A) 03/07/2023     Using OTC eye drops to keep eyes lubricated. He has not been taping eyelid shut   H/o bell's palsy 20 yrs ago.  Significant family stressors recently.  He's also been stressed with scalp skin cancer treatments.   Home health not set up.  Other follow up appointments scheduled: none ______________________________________________________________________ Hospital admission: 04/10/2023 Hospital discharge: 04/11/2023 TCM f/u phone call: not performed   Recommendations at discharge:  Follow-up with PCP in 1 week.   Discharge Diagnoses: Principal Problem:   Stroke-like  symptom Active Problems:   S/P CABG (coronary artery bypass graft)   History of cerebellar stroke   Recurrent falls   CAD (coronary artery disease)   HTN (hypertension)   Type 2 diabetes mellitus with diabetic neuropathy, unspecified (HCC)   Irritable bowel syndrome   Insomnia   Hyperlipidemia associated with type 2 diabetes mellitus (HCC)   GERD (gastroesophageal reflux disease)   BPH with obstruction/lower urinary tract symptoms   Right otitis media Bell's palsy     Relevant past medical, surgical, family and social history reviewed and updated as indicated. Interim medical history since our last visit reviewed. Allergies and medications reviewed and updated. Outpatient Medications Prior to Visit  Medication Sig Dispense Refill   Accu-Chek Softclix Lancets lancets Use as instructed to check sugars once daily E11.42 100 each 3   Alpha-Lipoic Acid 600 MG CAPS Take 1 capsule (600 mg total) by mouth daily.     aspirin EC 81 MG tablet Take 1 tablet (81 mg total) by mouth every Monday, Wednesday, and Friday. Swallow whole.     atorvastatin (LIPITOR) 40 MG tablet Take 1 tablet (40 mg total) by mouth daily. 90 tablet 1   clopidogrel (PLAVIX) 75 MG tablet Take 1 tablet (75 mg total) by mouth daily.     Cranberry 500 MG CAPS Take 2 capsules (1,000 mg total) by mouth daily.     DULoxetine (CYMBALTA) 30 MG capsule Take 1 capsule (30 mg total) by mouth daily. 90 capsule 1   finasteride (PROSCAR) 5 MG tablet  Take 1 tablet (5 mg total) by mouth daily. 90 tablet 1   fluticasone (FLONASE) 50 MCG/ACT nasal spray SPRAY 2 SPRAYS INTO EACH NOSTRIL EVERY DAY 48 mL 3   furosemide (LASIX) 20 MG tablet Take 1 tablet (20 mg total) by mouth daily. 90 tablet 1   gabapentin (NEURONTIN) 300 MG capsule Take 2 capsules (600 mg total) by mouth at bedtime.     glucose blood (ACCU-CHEK AVIVA PLUS) test strip Use as instructed to check sugars daily E11.42 100 each 3   hydrocortisone 2.5 % cream Apply 1 Application  topically 2 (two) times daily.     ipratropium (ATROVENT) 0.06 % nasal spray Place 2 sprays into both nostrils 4 (four) times daily. 15 mL 0   isosorbide mononitrate (IMDUR) 30 MG 24 hr tablet Take 1 tablet (30 mg total) by mouth daily. 90 tablet 3   ketoconazole (NIZORAL) 2 % cream Apply topically.     losartan (COZAAR) 50 MG tablet Take 1 tablet (50 mg total) by mouth daily. 90 tablet 1   metFORMIN (GLUCOPHAGE) 500 MG tablet Take 1 tablet (500 mg total) by mouth 2 (two) times daily with a meal. 180 tablet 1   mirabegron ER (MYRBETRIQ) 25 MG TB24 tablet Take 1 tablet (25 mg total) by mouth daily. 30 tablet 3   mupirocin ointment (BACTROBAN) 2 % Apply 1 Application topically 2 (two) times daily.     polyethylene glycol powder (GLYCOLAX/MIRALAX) 17 GM/SCOOP powder TAKE 8.5-17 G BY MOUTH DAILY AS NEEDED FOR MODERATE CONSTIPATION. 510 g 1   predniSONE (DELTASONE) 20 MG tablet Take 3 tablets (60 mg total) by mouth daily with breakfast for 6 days, THEN 2.5 tablets (50 mg total) daily with breakfast for 1 day, THEN 2 tablets (40 mg total) daily with breakfast for 1 day, THEN 1.5 tablets (30 mg total) daily with breakfast for 1 day, THEN 1 tablet (20 mg total) daily with breakfast for 1 day, THEN 0.5 tablets (10 mg total) daily with breakfast for 1 day. 25.5 tablet 0   traZODone (DESYREL) 50 MG tablet Take 1.5 tablets (75 mg total) by mouth at bedtime. 135 tablet 1   valACYclovir (VALTREX) 1000 MG tablet Take 1 tablet (1,000 mg total) by mouth 3 (three) times daily for 7 days. 21 tablet 0   Blood Glucose Monitoring Suppl (ACCU-CHEK AVIVA PLUS) w/Device KIT Use as directed to check sugars daily. E11.42 1 kit 0   No facility-administered medications prior to visit.     Per HPI unless specifically indicated in ROS section below Review of Systems  Objective:  BP 136/64   Pulse (!) 58   Temp 97.9 F (36.6 C) (Oral)   Ht 5' 7.5" (1.715 m)   Wt 177 lb (80.3 kg)   SpO2 96%   BMI 27.31 kg/m   Wt  Readings from Last 3 Encounters:  04/16/23 177 lb (80.3 kg)  04/10/23 173 lb 15.1 oz (78.9 kg)  03/11/23 174 lb (78.9 kg)      Physical Exam Vitals and nursing note reviewed.  Constitutional:      Appearance: Normal appearance. He is not ill-appearing.  HENT:     Head: Normocephalic and atraumatic.     Right Ear: Tympanic membrane, ear canal and external ear normal. There is no impacted cerumen.     Left Ear: Tympanic membrane, ear canal and external ear normal. There is no impacted cerumen.     Mouth/Throat:     Mouth: Mucous membranes are moist.  Pharynx: Oropharynx is clear. No oropharyngeal exudate or posterior oropharyngeal erythema.  Eyes:     Extraocular Movements: Extraocular movements intact.     Conjunctiva/sclera: Conjunctivae normal.     Pupils: Pupils are equal, round, and reactive to light.     Comments: R facial weakness present with incomplete R eyelid closure   Cardiovascular:     Rate and Rhythm: Normal rate and regular rhythm.     Pulses: Normal pulses.     Heart sounds: Normal heart sounds. No murmur heard. Pulmonary:     Effort: Pulmonary effort is normal. No respiratory distress.     Breath sounds: Normal breath sounds. No wheezing, rhonchi or rales.  Musculoskeletal:     Right lower leg: No edema.     Left lower leg: No edema.  Skin:    General: Skin is warm and dry.     Findings: No rash.  Neurological:     Mental Status: He is alert.  Psychiatric:        Mood and Affect: Mood normal.        Behavior: Behavior normal.       Results for orders placed or performed in visit on 04/16/23  POCT glucose (manual entry)   Collection Time: 04/16/23 12:06 PM  Result Value Ref Range   POC Glucose 205 (A) 70 - 99 mg/dl   Lab Results  Component Value Date   HGBA1C 7.1 (A) 03/07/2023    Lab Results  Component Value Date   NA 139 04/11/2023   CL 100 04/11/2023   K 4.1 04/11/2023   CO2 30 04/11/2023   BUN 23 04/11/2023   CREATININE 1.04 04/11/2023    GFRNONAA >60 04/11/2023   CALCIUM 9.8 04/11/2023   ALBUMIN 4.1 04/10/2023   GLUCOSE 145 (H) 04/11/2023   Echocardiogram 03/2023: EF 50-55%, regional wall motion abnormalities present with moderate LVH and mod hypokinesis of entire inferolateral wall, LA mild dilation, mild MR, no interatrial shunt on bubble study.   Assessment & Plan:   Problem List Items Addressed This Visit     Type 2 diabetes mellitus with diabetic neuropathy, unspecified (HCC)   Discussed steroid -induced hyperglycemia.  Cbg today 205.  Continue metformin.  Will not add sulfonylurea  but I did ask him to monitor sugars at home and let me know if consistently >200 to consider while on prednisone.  Encouraged limiting dietary sugar/carbs during prednisone course.       Relevant Orders   POCT glucose (manual entry) (Completed)   Right-sided Bell's palsy [facility] records reviewed. Imaging negative for acute stroke. S/p neurology evaluation - consistent with R sided bell's palsy complicated by R acute otitis media.  He will complete prednisone taper and valacyclovir course.  Discussed eye care during sleep for incomplete lid closure.       Right otitis media   Completed 5d augmentin course with resolution of acute otitis         No orders of the defined types were placed in this encounter.   Orders Placed This Encounter  Procedures   POCT glucose (manual entry)    Patient Instructions  Check sugars regularly while on steroid taper. Caution the steroids can increase your sugar levels.  If needed, we may add diabetes medicine while you're on steroids. Increase water, limit sugar and carbs while on prednisone steroids.  Take one more day of 3 pills then start daily taper by 1/2 tablet each day until done.  Continue lubricating  eye drops throughout the day as needed Tape right eyelid shut at night to help protect the eye.  Sugar checked today - overall ok.  Follow up plan: Return if symptoms  worsen or fail to improve.  Eustaquio Boyden, MD

## 2023-04-16 NOTE — Assessment & Plan Note (Signed)
 Completed 5d augmentin course with resolution of acute otitis

## 2023-05-10 ENCOUNTER — Other Ambulatory Visit: Payer: Self-pay | Admitting: Cardiovascular Disease

## 2023-05-10 DIAGNOSIS — I200 Unstable angina: Secondary | ICD-10-CM

## 2023-05-12 LAB — HM DIABETES EYE EXAM

## 2023-05-28 DIAGNOSIS — H2513 Age-related nuclear cataract, bilateral: Secondary | ICD-10-CM | POA: Diagnosis not present

## 2023-05-28 DIAGNOSIS — H2512 Age-related nuclear cataract, left eye: Secondary | ICD-10-CM | POA: Diagnosis not present

## 2023-06-11 ENCOUNTER — Encounter: Payer: Self-pay | Admitting: Ophthalmology

## 2023-06-12 ENCOUNTER — Encounter: Payer: Self-pay | Admitting: Ophthalmology

## 2023-06-12 NOTE — Anesthesia Preprocedure Evaluation (Addendum)
 Anesthesia Evaluation  Patient identified by MRN, date of birth, ID band Patient awake    Reviewed: Allergy & Precautions, H&P , NPO status , Patient's Chart, lab work & pertinent test results  Airway Mallampati: III   Neck ROM: Full    Dental  (+) Missing Multiple missing, none loose:   Pulmonary neg pulmonary ROS   Pulmonary exam normal breath sounds clear to auscultation       Cardiovascular hypertension, + CAD, + Past MI and +CHF  Normal cardiovascular exam+ dysrhythmias  Rhythm:Regular Rate:Normal  02-28-23 note: EKG personally reviewed by myself on todays visit EKG Interpretation Date/Time:                  Friday February 28 2023 10:48:35 EST Ventricular Rate:         60 PR Interval:                 226 QRS Duration:             148 QT Interval:                 462 QTC Calculation:462 R Axis:                         -63   Text Interpretation:Sinus rhythm with 1st degree A-V block Left axis deviation Right bundle branch block Inferior infarct (cited on or before 27-Jun-2021) When compared with ECG of 23-May-2022 15:56, Premature supraventricular complexes are no longer Present Criteria for Anterior infarct are no longer Present Confirmed by Belva Boyden [phone]) on 02/28/2023 11:13:29 AM     Neuro/Psych  PSYCHIATRIC DISORDERS Anxiety Depression     Neuromuscular disease CVA negative neurological ROS  negative psych ROS   GI/Hepatic negative GI ROS, Neg liver ROS,GERD  ,,  Endo/Other  diabetes    Renal/GU negative Renal ROS  negative genitourinary   Musculoskeletal negative musculoskeletal ROS (+)    Abdominal   Peds negative pediatric ROS (+)  Hematology negative hematology ROS (+)   Anesthesia Other Findings CAD (coronary artery disease)  HTN (hypertension) HLD (hyperlipidemia)  Diabetes mellitus type II GERD (gastroesophageal reflux disease) Chronic rhinitis Ischemic heart disease  MI (myocardial  infarction) (HCC) History of BPH  Fatty liver Diverticulosis  Shingles rash History of shingles  Melanoma (HCC) Sialadenitis Diastolic dysfunction  Stroke April 2023 Bell's palsy right side facial droop Right bundle branch block (RBBB S/P CABG GERD Depression Anxiety Type 2 diabetes with peripheral neuropathy    Reproductive/Obstetrics negative OB ROS                             Anesthesia Physical Anesthesia Plan  ASA: 3  Anesthesia Plan: MAC   Post-op Pain Management:    Induction: Intravenous  PONV Risk Score and Plan:   Airway Management Planned: Natural Airway and Nasal Cannula  Additional Equipment:   Intra-op Plan:   Post-operative Plan:   Informed Consent: I have reviewed the patients History and Physical, chart, labs and discussed the procedure including the risks, benefits and alternatives for the proposed anesthesia with the patient or authorized representative who has indicated his/her understanding and acceptance.     Dental Advisory Given  Plan Discussed with: Anesthesiologist, CRNA and Surgeon  Anesthesia Plan Comments: (Patient consented for risks of anesthesia including but not limited to:  - adverse reactions to medications - damage to eyes, teeth, lips or other oral mucosa -  nerve damage due to positioning  - sore throat or hoarseness - Damage to heart, brain, nerves, lungs, other parts of body or loss of life  Patient voiced understanding and assent.)       Anesthesia Quick Evaluation

## 2023-06-17 NOTE — Discharge Instructions (Signed)

## 2023-06-18 ENCOUNTER — Ambulatory Visit
Admission: RE | Admit: 2023-06-18 | Discharge: 2023-06-18 | Disposition: A | Discharge status: Home / Self Care | Payer: Medicare (Managed Care) | Attending: Ophthalmology | Admitting: Ophthalmology

## 2023-06-18 ENCOUNTER — Encounter
Admission: RE | Disposition: A | Discharge status: Home / Self Care | Payer: Self-pay | Source: Home / Self Care | Attending: Ophthalmology

## 2023-06-18 ENCOUNTER — Ambulatory Visit: Payer: Medicare (Managed Care) | Admitting: Anesthesiology

## 2023-06-18 ENCOUNTER — Other Ambulatory Visit: Payer: Self-pay

## 2023-06-18 ENCOUNTER — Encounter: Payer: Self-pay | Admitting: Ophthalmology

## 2023-06-18 DIAGNOSIS — E119 Type 2 diabetes mellitus without complications: Secondary | ICD-10-CM | POA: Insufficient documentation

## 2023-06-18 DIAGNOSIS — Z8673 Personal history of transient ischemic attack (TIA), and cerebral infarction without residual deficits: Secondary | ICD-10-CM | POA: Insufficient documentation

## 2023-06-18 DIAGNOSIS — Z8249 Family history of ischemic heart disease and other diseases of the circulatory system: Secondary | ICD-10-CM | POA: Diagnosis not present

## 2023-06-18 DIAGNOSIS — E1136 Type 2 diabetes mellitus with diabetic cataract: Secondary | ICD-10-CM | POA: Diagnosis not present

## 2023-06-18 DIAGNOSIS — F32A Depression, unspecified: Secondary | ICD-10-CM | POA: Insufficient documentation

## 2023-06-18 DIAGNOSIS — I2510 Atherosclerotic heart disease of native coronary artery without angina pectoris: Secondary | ICD-10-CM | POA: Diagnosis not present

## 2023-06-18 DIAGNOSIS — Z7984 Long term (current) use of oral hypoglycemic drugs: Secondary | ICD-10-CM | POA: Insufficient documentation

## 2023-06-18 DIAGNOSIS — I509 Heart failure, unspecified: Secondary | ICD-10-CM | POA: Diagnosis not present

## 2023-06-18 DIAGNOSIS — I252 Old myocardial infarction: Secondary | ICD-10-CM | POA: Diagnosis not present

## 2023-06-18 DIAGNOSIS — F419 Anxiety disorder, unspecified: Secondary | ICD-10-CM | POA: Insufficient documentation

## 2023-06-18 DIAGNOSIS — I110 Hypertensive heart disease with heart failure: Secondary | ICD-10-CM | POA: Diagnosis not present

## 2023-06-18 DIAGNOSIS — I5032 Chronic diastolic (congestive) heart failure: Secondary | ICD-10-CM | POA: Diagnosis not present

## 2023-06-18 DIAGNOSIS — G709 Myoneural disorder, unspecified: Secondary | ICD-10-CM | POA: Diagnosis not present

## 2023-06-18 DIAGNOSIS — K219 Gastro-esophageal reflux disease without esophagitis: Secondary | ICD-10-CM | POA: Diagnosis not present

## 2023-06-18 DIAGNOSIS — H2512 Age-related nuclear cataract, left eye: Secondary | ICD-10-CM | POA: Diagnosis not present

## 2023-06-18 HISTORY — DX: Irritable bowel syndrome, unspecified: K58.9

## 2023-06-18 HISTORY — DX: Depression, unspecified: F32.A

## 2023-06-18 HISTORY — DX: Anxiety disorder, unspecified: F41.9

## 2023-06-18 HISTORY — DX: Cerebral infarction, unspecified: I63.9

## 2023-06-18 HISTORY — DX: Unspecified right bundle-branch block: I45.10

## 2023-06-18 HISTORY — DX: Presence of aortocoronary bypass graft: Z95.1

## 2023-06-18 LAB — GLUCOSE, CAPILLARY: Glucose-Capillary: 141 mg/dL — ABNORMAL HIGH (ref 70–99)

## 2023-06-18 SURGERY — PHACOEMULSIFICATION, CATARACT, WITH IOL INSERTION
Anesthesia: Monitor Anesthesia Care | Site: Eye | Laterality: Left

## 2023-06-18 MED ORDER — GLYCOPYRROLATE 0.2 MG/ML IJ SOLN
INTRAMUSCULAR | Status: DC | PRN
Start: 2023-06-18 — End: 2023-06-18
  Administered 2023-06-18: .2 mg via INTRAVENOUS

## 2023-06-18 MED ORDER — TETRACAINE HCL 0.5 % OP SOLN: 1.0000 [drp] | OPHTHALMIC | Status: DC | PRN

## 2023-06-18 MED ORDER — SIGHTPATH DOSE#1 BSS IO SOLN
INTRAOCULAR | Status: DC | PRN
Start: 2023-06-18 — End: 2023-06-18
  Administered 2023-06-18: 15 mL via INTRAOCULAR

## 2023-06-18 MED ORDER — CEFUROXIME OPHTHALMIC INJECTION 1 MG/0.1 ML
INJECTION | OPHTHALMIC | Status: DC | PRN
  Administered 2023-06-18: 1 mg via INTRACAMERAL

## 2023-06-18 MED ORDER — BRIMONIDINE TARTRATE-TIMOLOL 0.2-0.5 % OP SOLN
OPHTHALMIC | Status: DC | PRN
  Administered 2023-06-18: 1 [drp] via OPHTHALMIC

## 2023-06-18 MED ORDER — FENTANYL CITRATE (PF) 100 MCG/2ML IJ SOLN
INTRAMUSCULAR | Status: DC | PRN
  Administered 2023-06-18: 25 ug via INTRAVENOUS

## 2023-06-18 MED ORDER — TETRACAINE HCL 0.5 % OP SOLN
1.0000 [drp] | OPHTHALMIC | Status: DC | PRN
  Administered 2023-06-18 (×3): 1 [drp] via OPHTHALMIC

## 2023-06-18 MED ORDER — MIDAZOLAM HCL 2 MG/2ML IJ SOLN
INTRAMUSCULAR | Status: AC
  Filled 2023-06-18: qty 2

## 2023-06-18 MED ORDER — SODIUM CHLORIDE 0.9% FLUSH
INTRAVENOUS | Status: DC | PRN
  Administered 2023-06-18: 10 mL via INTRAVENOUS

## 2023-06-18 MED ORDER — FENTANYL CITRATE (PF) 100 MCG/2ML IJ SOLN
INTRAMUSCULAR | Status: AC
  Filled 2023-06-18: qty 2

## 2023-06-18 MED ORDER — SIGHTPATH DOSE#1 NA HYALUR & NA CHOND-NA HYALUR IO KIT
PACK | INTRAOCULAR | Status: DC | PRN
  Administered 2023-06-18: 1 via OPHTHALMIC

## 2023-06-18 MED ORDER — MIDAZOLAM HCL 2 MG/2ML IJ SOLN
INTRAMUSCULAR | Status: DC | PRN
  Administered 2023-06-18: .5 mg via INTRAVENOUS

## 2023-06-18 MED ORDER — LIDOCAINE HCL (PF) 2 % IJ SOLN
INTRAMUSCULAR | Status: DC | PRN
  Administered 2023-06-18: 2 mL

## 2023-06-18 MED ORDER — ARMC OPHTHALMIC DILATING DROPS: 1.0000 | OPHTHALMIC | Status: DC | PRN

## 2023-06-18 MED ORDER — ARMC OPHTHALMIC DILATING DROPS
1.0000 | OPHTHALMIC | Status: DC | PRN
  Administered 2023-06-18 (×3): 1 via OPHTHALMIC

## 2023-06-18 MED ORDER — ARMC OPHTHALMIC DILATING DROPS
OPHTHALMIC | Status: AC
  Filled 2023-06-18: qty 0.5

## 2023-06-18 MED ORDER — TETRACAINE HCL 0.5 % OP SOLN
OPHTHALMIC | Status: AC
  Filled 2023-06-18: qty 4

## 2023-06-18 MED ORDER — SIGHTPATH DOSE#1 BSS IO SOLN
INTRAOCULAR | Status: DC | PRN
  Administered 2023-06-18: 58 mL via OPHTHALMIC

## 2023-06-18 SURGICAL SUPPLY — 10 items
CATARACT SUITE SIGHTPATH (MISCELLANEOUS) ×1 IMPLANT
FEE CATARACT SUITE SIGHTPATH (MISCELLANEOUS) ×1 IMPLANT
GLOVE BIOGEL PI IND STRL 8 (GLOVE) ×1 IMPLANT
GLOVE SURG LX STRL 7.5 STRW (GLOVE) ×1 IMPLANT
GLOVE SURG PROTEXIS BL SZ6.5 (GLOVE) ×1 IMPLANT
GLOVE SURG SYN 6.5 PF PI BL (GLOVE) ×1 IMPLANT
LENS IOL TECNIS EYHANCE 19.5 (Intraocular Lens) IMPLANT
NDL FILTER BLUNT 18X1 1/2 (NEEDLE) ×1 IMPLANT
NEEDLE FILTER BLUNT 18X1 1/2 (NEEDLE) ×1 IMPLANT
SYR 3ML LL SCALE MARK (SYRINGE) ×1 IMPLANT

## 2023-06-18 NOTE — H&P (Signed)
 [facility]   Primary Care Physician:  Claire Crick, MD Ophthalmologist: Dr. Annell Kidney  Pre-Procedure History & Physical: HPI:  Aaron Stafford is a 89 y.o. male here for ophthalmic surgery.   Past Medical History:  Diagnosis Date   Anxiety    Bell's palsy 04/10/2023   CAD (coronary artery disease)    a. 1995 s/p CABG x 4 (LIMA->LAD, VG->OM1->OM2, VG->RCA); b. 07/2009 PCI: LM 60, LAD 100ost, LCX 100p, RCA 100, LIMA->LAD atretic, VG->OM1->OM2 patent w/ collats to dLAD, VG->RCA 95 (3.5x15 Vision BMS). EF 55%; c. 02/2021 MV: EF 30% (50-55% by echo), large inferolateral scar w/ HK. No ischemia.   Chronic rhinitis    ENT [place]   COVID-19 virus infection 09/2019   Depression    Diabetes mellitus type II    Diastolic dysfunction    a. 03/2021 Echo: EF 50-55%, no rwma, GrI DD. RVSP 35.67mmHg. Mild MR.   Diverticulosis 06/2011   by CT and colonoscopy   Fatty liver 06/2011   by CT   GERD (gastroesophageal reflux disease)    GERD (gastroesophageal reflux disease)    History of BPH    History of shingles    HLD (hyperlipidemia)    HTN (hypertension)    IBS (irritable bowel syndrome)    Ischemic heart disease    Melanoma (HCC) 12/2015   L forearm, scalp   MI (myocardial infarction) (HCC) 1995   Right bundle branch block (RBBB)    S/P CABG (coronary artery bypass graft)    Shingles rash 09/26/2012   Sialadenitis 05/01/2017   Stroke (HCC)    06/12/21, 06/27/21    Past Surgical History:  Procedure Laterality Date   ANKLE SURGERY  03/2002   Left ankle fracture   CARDIAC CATHETERIZATION     CHOLECYSTECTOMY  1985   COLONOSCOPY     diverticulosis, tortuous colon with looping Peg Bouton)   CORONARY ARTERY BYPASS GRAFT  05/1993   x6 MI Attemped angioplasty   CORONARY STENT PLACEMENT  07/25/2009   Multi-Link VISION Cobalt chromium    CYSTOSCOPY  10/03/08   Normal-Dr. Cherylene Corrente   INGUINAL HERNIA REPAIR  11/2000   and ventral hernia repair    Prior to Admission  medications   Medication Sig Start Date End Date Taking? Authorizing Provider  Alpha-Lipoic Acid 600 MG CAPS Take 1 capsule (600 mg total) by mouth daily. 09/03/22  Yes Claire Crick, MD  aspirin  EC 81 MG tablet Take 1 tablet (81 mg total) by mouth every Monday, Wednesday, and Friday. Swallow whole. 03/04/22  Yes Claire Crick, MD  atorvastatin  (LIPITOR) 40 MG tablet Take 1 tablet (40 mg total) by mouth daily. 03/14/23  Yes Claire Crick, MD  clopidogrel  (PLAVIX ) 75 MG tablet Take 1 tablet (75 mg total) by mouth daily. 03/04/22  Yes Claire Crick, MD  Cranberry 500 MG CAPS Take 2 capsules (1,000 mg total) by mouth daily. 02/03/19  Yes Claire Crick, MD  DULoxetine  (CYMBALTA ) 30 MG capsule Take 1 capsule (30 mg total) by mouth daily. 03/14/23  Yes Claire Crick, MD  finasteride  (PROSCAR ) 5 MG tablet Take 1 tablet (5 mg total) by mouth daily. 03/14/23  Yes Claire Crick, MD  fluticasone  (FLONASE ) 50 MCG/ACT nasal spray SPRAY 2 SPRAYS INTO EACH NOSTRIL EVERY DAY 10/29/21  Yes Claire Crick, MD  furosemide  (LASIX ) 20 MG tablet Take 1 tablet (20 mg total) by mouth daily. 03/14/23  Yes Claire Crick, MD  gabapentin  (NEURONTIN ) 300 MG capsule Take 2 capsules (600 mg total) by mouth at  bedtime. 03/07/23  Yes Claire Crick, MD  hydrocortisone 2.5 % cream Apply 1 Application topically 2 (two) times daily. 03/03/23  Yes [provider]  ipratropium (ATROVENT ) 0.06 % nasal spray Place 2 sprays into both nostrils 4 (four) times daily. 07/17/22  Yes Nancy Axon B, PA-C  isosorbide  mononitrate (IMDUR ) 30 MG 24 hr tablet TAKE 1/2 OF A TABLET (15 MG TOTAL) BY MOUTH DAILY 05/12/23 05/11/24 Yes Gollan, Timothy J, MD  ketoconazole (NIZORAL) 2 % cream Apply topically. 06/25/21  Yes [provider]  losartan  (COZAAR ) 50 MG tablet Take 1 tablet (50 mg total) by mouth daily. 03/14/23  Yes Claire Crick, MD  metFORMIN  (GLUCOPHAGE ) 500 MG tablet Take 1 tablet (500 mg total) by  mouth 2 (two) times daily with a meal. Patient taking differently: Take 500 mg by mouth 2 (two) times daily with a meal. Only takes 1 in AM 03/14/23  Yes Claire Crick, MD  mirabegron  ER (MYRBETRIQ ) 25 MG TB24 tablet Take 1 tablet (25 mg total) by mouth daily. 09/03/22  Yes Claire Crick, MD  mupirocin  ointment (BACTROBAN ) 2 % Apply 1 Application topically 2 (two) times daily. 03/03/23  Yes [provider]  polyethylene glycol powder (GLYCOLAX /MIRALAX ) 17 GM/SCOOP powder TAKE 8.5-17 G BY MOUTH DAILY AS NEEDED FOR MODERATE CONSTIPATION. 10/18/20  Yes Claire Crick, MD  traZODone  (DESYREL ) 50 MG tablet Take 1.5 tablets (75 mg total) by mouth at bedtime. 03/14/23  Yes Claire Crick, MD  Accu-Chek Softclix Lancets lancets Use as instructed to check sugars once daily E11.42 03/20/22   Claire Crick, MD  glucose blood (ACCU-CHEK AVIVA PLUS) test strip Use as instructed to check sugars daily E11.42 03/20/22   Claire Crick, MD    Allergies as of 05/14/2023 - Review Complete 04/16/2023  Allergen Reaction Noted   Morphine sulfate     Nitroglycerin     Contrast media [iodinated contrast media] Rash 07/23/2011    Family History  Problem Relation Age of Onset   Kidney failure Mother    Coronary artery disease Mother    Heart failure Mother    Hypertension Brother    Heart attack Brother    Alcohol abuse Brother    Hypertension Brother    Hypertension Sister     Social History   Socioeconomic History   Marital status: Married    Spouse name: Not on file   Number of children: Not on file   Years of education: Not on file   Highest education level: Not on file  Occupational History   Occupation: Education officer, environmental  Tobacco Use   Smoking status: Never   Smokeless tobacco: Never  Vaping Use   Vaping status: Never Used  Substance and Sexual Activity   Alcohol use: No    Alcohol/week: 0.0 standard drinks of alcohol   Drug use: No   Sexual activity: Not Currently  Other  Topics Concern   Not on file  Social History Narrative   Caffeine: 1 cup/day, 2 cups tea daily   Lives with Marily Shows (wife) and 3 dogs   Occupation: Education officer, 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[place], 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   Activity: no regular exercise, does walk some   Diet: good water, fruits/vegetables daily   Social Drivers of Corporate investment banker Strain: Low Risk  (08/16/2021)   Overall Financial Resource Strain (CARDIA)    Difficulty of Paying Living Expenses: Not hard at all  Food Insecurity: No Food Insecurity (04/10/2023)   Hunger Vital Sign    Worried About Running Out  of Food in the Last Year: Never true    Ran Out of Food in the Last Year: Never true  Transportation Needs: No Transportation Needs (04/10/2023)   PRAPARE - Administrator, Civil Service (Medical): No    Lack of Transportation (Non-Medical): No  Physical Activity: Sufficiently Active (08/16/2021)   Exercise Vital Sign    Days of Exercise per Week: 5 days    Minutes of Exercise per Session: 30 min  Stress: No Stress Concern Present (08/16/2021)   [employer] of Occupational Health - Occupational Stress Questionnaire    Feeling of Stress : Not at all  Social Connections: Socially Integrated (04/10/2023)   Social Connection and Isolation Panel [NHANES]    Frequency of Communication with Friends and Family: More than three times a week    Frequency of Social Gatherings with Friends and Family: More than three times a week    Attends Religious Services: More than 4 times per year    Active Member of [employer] or Organizations: Yes    Attends Engineer, structural: More than 4 times per year    Marital Status: Married  Catering manager Violence: Not At Risk (04/10/2023)   Humiliation, Afraid, Rape, and Kick questionnaire    Fear of Current or Ex-Partner: No    Emotionally Abused: No    Physically Abused: No    Sexually Abused: No    Review of Systems: See HPI, otherwise negative ROS  Physical Exam: BP (!)  159/71   Pulse (!) 54   Temp (!) 97.5 F (36.4 C) (Temporal)   Ht 5\' 10"  (1.778 m)   Wt 78.9 kg   SpO2 98%   BMI 24.97 kg/m  General:   Alert,  pleasant and cooperative in NAD Head:  Normocephalic and atraumatic. Lungs:  Clear to auscultation.    Heart:  Regular rate and rhythm.   Impression/Plan: SELMA BRYER is here for ophthalmic surgery.  Risks, benefits, limitations, and alternatives regarding ophthalmic surgery have been reviewed with the patient.  Questions have been answered.  All parties agreeable.   Annell Kidney, MD  06/18/2023, 8:23 AM

## 2023-06-18 NOTE — Anesthesia Postprocedure Evaluation (Signed)
 Anesthesia Post Note  Patient: Aaron Stafford  Procedure(s) Performed: PHACOEMULSIFICATION, CATARACT, WITH IOL INSERTION 6.02 00:35.5 (Left: Eye)  Patient location during evaluation: PACU Anesthesia Type: MAC Level of consciousness: awake and alert Pain management: pain level controlled Vital Signs Assessment: post-procedure vital signs reviewed and stable Respiratory status: spontaneous breathing, nonlabored ventilation, respiratory function stable and patient connected to nasal cannula oxygen Cardiovascular status: stable and blood pressure returned to baseline Postop Assessment: no apparent nausea or vomiting Anesthetic complications: no   No notable events documented.   Last Vitals:  Vitals:   06/18/23 0905 06/18/23 0912  BP: 120/71 125/64  Pulse: (!) 101 73  Resp: 20 19  Temp:  (!) 36.4 C  SpO2: 100% 100%    Last Pain:  Vitals:   06/18/23 0912  TempSrc:   PainSc: 0-No pain                 Elwin Tsou C Pruitt Taboada

## 2023-06-18 NOTE — Op Note (Signed)
 OPERATIVE NOTE  Aaron Stafford [id number] 06/18/2023   PREOPERATIVE DIAGNOSIS:  Nuclear sclerotic cataract left eye. H25.12   POSTOPERATIVE DIAGNOSIS:    Nuclear sclerotic cataract left eye.     PROCEDURE:  Phacoemusification with posterior chamber intraocular lens placement of the left eye  Ultrasound time: Procedure(s): PHACOEMULSIFICATION, CATARACT, WITH IOL INSERTION 6.02 00:35.5 (Left)  LENS:   Implant Name Type Inv. Item Serial No. Manufacturer Lot No. LRB No. Used Action  LENS IOL TECNIS EYHANCE 19.5 - [id number] Intraocular Lens LENS IOL TECNIS EYHANCE 19.5 [id number] SIGHTPATH  Left 1 Implanted      SURGEON:  Berline Brenner, MD   ANESTHESIA:  Topical with tetracaine drops and 2% Xylocaine jelly, augmented with 1% preservative-free intracameral lidocaine.    COMPLICATIONS:  None.   DESCRIPTION OF PROCEDURE:  The patient was identified in the holding room and transported to the operating room and placed in the supine position under the operating microscope.  The left eye was identified as the operative eye and it was prepped and draped in the usual sterile ophthalmic fashion.   A 1 millimeter clear-corneal paracentesis was made at the 1:30 position.  0.5 ml of preservative-free 1% lidocaine was injected into the anterior chamber.  The anterior chamber was filled with Viscoat viscoelastic.  A 2.4 millimeter keratome was used to make a near-clear corneal incision at the 10:30 position.  .  A curvilinear capsulorrhexis was made with a cystotome and capsulorrhexis forceps.  Balanced salt solution was used to hydrodissect and hydrodelineate the nucleus.   Phacoemulsification was then used in stop and chop fashion to remove the lens nucleus and epinucleus.  The remaining cortex was then removed using the irrigation and aspiration handpiece. Provisc was then placed into the capsular bag to distend it for lens placement.  A lens was then injected into the capsular bag.  The  remaining viscoelastic was aspirated.   Wounds were hydrated with balanced salt solution.  The anterior chamber was inflated to a physiologic pressure with balanced salt solution.  No wound leaks were noted. Cefuroxime 0.1 ml of a 10mg /ml solution was injected into the anterior chamber for a dose of 1 mg of intracameral antibiotic at the completion of the case.   Timolol and Brimonidine drops were applied to the eye.  The patient was taken to the recovery room in stable condition without complications of anesthesia or surgery.  Aaron Stafford 06/18/2023, 9:03 AM

## 2023-06-18 NOTE — Transfer of Care (Signed)
 Immediate Anesthesia Transfer of Care Note  Patient: Aaron Stafford  Procedure(s) Performed: PHACOEMULSIFICATION, CATARACT, WITH IOL INSERTION 6.02 00:35.5 (Left: Eye)  Patient Location: PACU  Anesthesia Type:MAC  Level of Consciousness: awake, alert , and oriented  Airway & Oxygen Therapy: Patient Spontanous Breathing  Post-op Assessment: Report given to RN and Post -op Vital signs reviewed and stable  Post vital signs: Reviewed and stable  Last Vitals: Normal temp Vitals Value Taken Time  BP 120/71 06/18/23 0905  Temp    Pulse 65 06/18/23 0907  Resp 10 06/18/23 0907  SpO2 100 % 06/18/23 0907  Vitals shown include unfiled device data.  Last Pain:  Vitals:   06/18/23 0904  TempSrc:   PainSc: 0-No pain         Complications: No notable events documented.

## 2023-06-19 DIAGNOSIS — H2511 Age-related nuclear cataract, right eye: Secondary | ICD-10-CM | POA: Diagnosis not present

## 2023-07-01 NOTE — Discharge Instructions (Signed)

## 2023-07-01 NOTE — Anesthesia Preprocedure Evaluation (Signed)
 Anesthesia Evaluation  Patient identified by MRN, date of birth, ID band Patient awake    Reviewed: Allergy & Precautions, H&P , NPO status , Patient's Chart, lab work & pertinent test results  Airway Mallampati: III  TM Distance: >3 FB Neck ROM: Full    Dental no notable dental hx. (+) Missing Multiple missing, none loose:   Pulmonary neg pulmonary ROS   Pulmonary exam normal breath sounds clear to auscultation       Cardiovascular hypertension, + CAD, + Past MI and +CHF  negative cardio ROS Normal cardiovascular exam+ dysrhythmias  Rhythm:Regular Rate:Normal   02-28-23 note: EKG personally reviewed by myself on todays visit EKG Interpretation Date/Time:                  Friday February 28 2023 10:48:35 EST Ventricular Rate:         60 PR Interval:                 226 QRS Duration:             148 QT Interval:                 462 QTC Calculation:462 R Axis:                         -63   Text Interpretation:Sinus rhythm with 1st degree A-V block Left axis deviation Right bundle branch block Inferior infarct (cited on or before 27-Jun-2021) When compared with ECG of 23-May-2022 15:56, Premature supraventricular complexes are no longer Present Criteria for Anterior infarct are no longer Present Confirmed by Belva Boyden [phone]) on 02/28/2023 11:13:29 AM     Neuro/Psych  PSYCHIATRIC DISORDERS Anxiety Depression     Neuromuscular disease CVA negative neurological ROS  negative psych ROS   GI/Hepatic negative GI ROS, Neg liver ROS,GERD  ,,  Endo/Other  negative endocrine ROSdiabetes    Renal/GU negative Renal ROS  negative genitourinary   Musculoskeletal negative musculoskeletal ROS (+)    Abdominal   Peds negative pediatric ROS (+)  Hematology negative hematology ROS (+)   Anesthesia Other Findings Previous cataract surgery 06-18-23 Dr. Aldo Amble   CAD (coronary artery disease)        HTN (hypertension) HLD  (hyperlipidemia)          Diabetes mellitus type II GERD (gastroesophageal reflux disease)Chronic rhinitis Ischemic heart disease  MI (myocardial infarction) (HCC) History of BPH             Fatty liver Diverticulosis             Shingles rash History of shingles       Melanoma (HCC) Sialadenitis Diastolic dysfunction      Stroke April 2023 Bell's palsy right side facial droop Right bundle branch block (RBBB S/P CABG GERD Depression Anxiety Type 2 diabetes with peripheral neuropathy   Reproductive/Obstetrics negative OB ROS                             Anesthesia Physical Anesthesia Plan  ASA: 3  Anesthesia Plan: MAC   Post-op Pain Management:    Induction: Intravenous  PONV Risk Score and Plan:   Airway Management Planned: Natural Airway and Nasal Cannula  Additional Equipment:   Intra-op Plan:   Post-operative Plan:   Informed Consent: I have reviewed the patients History and Physical, chart, labs and discussed the procedure including the risks, benefits and alternatives  for the proposed anesthesia with the patient or authorized representative who has indicated his/her understanding and acceptance.     Dental Advisory Given  Plan Discussed with: Anesthesiologist, CRNA and Surgeon  Anesthesia Plan Comments: (Patient consented for risks of anesthesia including but not limited to:  - adverse reactions to medications - damage to eyes, teeth, lips or other oral mucosa - nerve damage due to positioning  - sore throat or hoarseness - Damage to heart, brain, nerves, lungs, other parts of body or loss of life  Patient voiced understanding and assent.)        Anesthesia Quick Evaluation

## 2023-07-02 ENCOUNTER — Other Ambulatory Visit: Payer: Self-pay

## 2023-07-02 ENCOUNTER — Encounter: Payer: Self-pay | Admitting: Ophthalmology

## 2023-07-02 ENCOUNTER — Ambulatory Visit: Payer: Medicare (Managed Care) | Admitting: Anesthesiology

## 2023-07-02 ENCOUNTER — Ambulatory Visit
Admission: RE | Admit: 2023-07-02 | Discharge: 2023-07-02 | Disposition: A | Discharge status: Home / Self Care | Payer: Medicare (Managed Care) | Attending: Ophthalmology | Admitting: Ophthalmology

## 2023-07-02 ENCOUNTER — Encounter
Admission: RE | Disposition: A | Discharge status: Home / Self Care | Payer: Self-pay | Source: Home / Self Care | Attending: Ophthalmology

## 2023-07-02 DIAGNOSIS — Z8673 Personal history of transient ischemic attack (TIA), and cerebral infarction without residual deficits: Secondary | ICD-10-CM | POA: Diagnosis not present

## 2023-07-02 DIAGNOSIS — E1140 Type 2 diabetes mellitus with diabetic neuropathy, unspecified: Secondary | ICD-10-CM | POA: Diagnosis not present

## 2023-07-02 DIAGNOSIS — E1136 Type 2 diabetes mellitus with diabetic cataract: Secondary | ICD-10-CM | POA: Insufficient documentation

## 2023-07-02 DIAGNOSIS — K219 Gastro-esophageal reflux disease without esophagitis: Secondary | ICD-10-CM | POA: Insufficient documentation

## 2023-07-02 DIAGNOSIS — I2510 Atherosclerotic heart disease of native coronary artery without angina pectoris: Secondary | ICD-10-CM | POA: Diagnosis not present

## 2023-07-02 DIAGNOSIS — I5030 Unspecified diastolic (congestive) heart failure: Secondary | ICD-10-CM | POA: Insufficient documentation

## 2023-07-02 DIAGNOSIS — I110 Hypertensive heart disease with heart failure: Secondary | ICD-10-CM | POA: Diagnosis not present

## 2023-07-02 DIAGNOSIS — I509 Heart failure, unspecified: Secondary | ICD-10-CM | POA: Diagnosis not present

## 2023-07-02 DIAGNOSIS — Z7984 Long term (current) use of oral hypoglycemic drugs: Secondary | ICD-10-CM | POA: Insufficient documentation

## 2023-07-02 DIAGNOSIS — H2511 Age-related nuclear cataract, right eye: Secondary | ICD-10-CM | POA: Insufficient documentation

## 2023-07-02 DIAGNOSIS — F32A Depression, unspecified: Secondary | ICD-10-CM | POA: Insufficient documentation

## 2023-07-02 DIAGNOSIS — I252 Old myocardial infarction: Secondary | ICD-10-CM | POA: Insufficient documentation

## 2023-07-02 DIAGNOSIS — F419 Anxiety disorder, unspecified: Secondary | ICD-10-CM | POA: Diagnosis not present

## 2023-07-02 LAB — GLUCOSE, CAPILLARY: Glucose-Capillary: 125 mg/dL — ABNORMAL HIGH (ref 70–99)

## 2023-07-02 SURGERY — PHACOEMULSIFICATION, CATARACT, WITH IOL INSERTION
Anesthesia: Monitor Anesthesia Care | Site: Eye | Laterality: Right

## 2023-07-02 MED ORDER — FENTANYL CITRATE (PF) 100 MCG/2ML IJ SOLN
INTRAMUSCULAR | Status: AC
  Filled 2023-07-02: qty 2

## 2023-07-02 MED ORDER — TETRACAINE HCL 0.5 % OP SOLN
1.0000 [drp] | OPHTHALMIC | Status: AC
  Administered 2023-07-02 (×3): 1 [drp] via OPHTHALMIC

## 2023-07-02 MED ORDER — SIGHTPATH DOSE#1 NA HYALUR & NA CHOND-NA HYALUR IO KIT
PACK | INTRAOCULAR | Status: DC | PRN
  Administered 2023-07-02: 1 via OPHTHALMIC

## 2023-07-02 MED ORDER — MIDAZOLAM HCL 2 MG/2ML IJ SOLN
INTRAMUSCULAR | Status: DC | PRN
Start: 2023-07-02 — End: 2023-07-02
  Administered 2023-07-02: 1 mg via INTRAVENOUS

## 2023-07-02 MED ORDER — BRIMONIDINE TARTRATE-TIMOLOL 0.2-0.5 % OP SOLN
OPHTHALMIC | Status: DC | PRN
  Administered 2023-07-02: 1 [drp] via OPHTHALMIC

## 2023-07-02 MED ORDER — CEFUROXIME OPHTHALMIC INJECTION 1 MG/0.1 ML
INJECTION | OPHTHALMIC | Status: DC | PRN
  Administered 2023-07-02: 1 mg via INTRACAMERAL

## 2023-07-02 MED ORDER — FENTANYL CITRATE (PF) 100 MCG/2ML IJ SOLN
INTRAMUSCULAR | Status: DC | PRN
  Administered 2023-07-02: 50 ug via INTRAVENOUS

## 2023-07-02 MED ORDER — ARMC OPHTHALMIC DILATING DROPS
OPHTHALMIC | Status: AC
  Filled 2023-07-02: qty 0.5

## 2023-07-02 MED ORDER — SIGHTPATH DOSE#1 BSS IO SOLN
INTRAOCULAR | Status: DC | PRN
  Administered 2023-07-02: 15 mL via INTRAOCULAR

## 2023-07-02 MED ORDER — TETRACAINE HCL 0.5 % OP SOLN
OPHTHALMIC | Status: AC
  Filled 2023-07-02: qty 4

## 2023-07-02 MED ORDER — MIDAZOLAM HCL 2 MG/2ML IJ SOLN
INTRAMUSCULAR | Status: AC
  Filled 2023-07-02: qty 2

## 2023-07-02 MED ORDER — LIDOCAINE HCL (PF) 2 % IJ SOLN
INTRAOCULAR | Status: DC | PRN
  Administered 2023-07-02: 2 mL

## 2023-07-02 MED ORDER — ARMC OPHTHALMIC DILATING DROPS
1.0000 | OPHTHALMIC | Status: AC
  Administered 2023-07-02 (×3): 1 via OPHTHALMIC

## 2023-07-02 MED ORDER — SIGHTPATH DOSE#1 BSS IO SOLN
INTRAOCULAR | Status: DC | PRN
  Administered 2023-07-02: 52 mL via OPHTHALMIC

## 2023-07-02 SURGICAL SUPPLY — 12 items
CATARACT SUITE SIGHTPATH (MISCELLANEOUS) ×1 IMPLANT
CYSTOTOME ANGL RVRS SHRT 25G (CUTTER) IMPLANT
CYSTOTOME ANGL RVRS SHRT 25GA (CUTTER) ×1 IMPLANT
FEE CATARACT SUITE SIGHTPATH (MISCELLANEOUS) ×1 IMPLANT
GLOVE BIOGEL PI IND STRL 8 (GLOVE) ×1 IMPLANT
GLOVE SURG LX STRL 7.5 STRW (GLOVE) ×1 IMPLANT
GLOVE SURG PROTEXIS BL SZ6.5 (GLOVE) ×1 IMPLANT
GLOVE SURG SYN 6.5 PF PI BL (GLOVE) ×1 IMPLANT
LENS IOL TECNIS EYHANCE 21.0 (Intraocular Lens) IMPLANT
NDL FILTER BLUNT 18X1 1/2 (NEEDLE) ×1 IMPLANT
NEEDLE FILTER BLUNT 18X1 1/2 (NEEDLE) ×1 IMPLANT
SYR 3ML LL SCALE MARK (SYRINGE) ×1 IMPLANT

## 2023-07-02 NOTE — Transfer of Care (Signed)
 Immediate Anesthesia Transfer of Care Note  Patient: Aaron Stafford  Procedure(s) Performed: PHACOEMULSIFICATION, CATARACT, WITH IOL INSERTION (Right)  Patient Location: PACU  Anesthesia Type: MAC  Level of Consciousness: awake, alert  and patient cooperative  Airway and Oxygen Therapy: Patient Spontanous Breathing and Patient connected to supplemental oxygen  Post-op Assessment: Post-op Vital signs reviewed, Patient's Cardiovascular Status Stable, Respiratory Function Stable, Patent Airway and No signs of Nausea or vomiting  Post-op Vital Signs: Reviewed and stable  Complications: No notable events documented.

## 2023-07-02 NOTE — Anesthesia Postprocedure Evaluation (Signed)
 Anesthesia Post Note  Patient: NIKKOLAS LUDEMANN  Procedure(s) Performed: PHACOEMULSIFICATION, CATARACT, WITH IOL INSERTION 7.06 00:33.6 (Right: Eye)  Patient location during evaluation: PACU Anesthesia Type: MAC Level of consciousness: awake and alert Pain management: pain level controlled Vital Signs Assessment: post-procedure vital signs reviewed and stable Respiratory status: spontaneous breathing, nonlabored ventilation, respiratory function stable and patient connected to nasal cannula oxygen Cardiovascular status: stable and blood pressure returned to baseline Postop Assessment: no apparent nausea or vomiting Anesthetic complications: no   No notable events documented.   Last Vitals:  Vitals:   07/02/23 1052 07/02/23 1057  BP: 136/68 132/68  Pulse: (!) 52 (!) 54  Resp: 16 17  Temp: (!) 36.4 C (!) 36.4 C  SpO2: 98% 96%    Last Pain:  Vitals:   07/02/23 1057  TempSrc:   PainSc: 0-No pain                 Valeska Haislip C Geniene List

## 2023-07-02 NOTE — H&P (Signed)
 [facility]   Primary Care Physician:  Claire Crick, MD Ophthalmologist: Dr. Annell Kidney  Pre-Procedure History & Physical: HPI:  Aaron Stafford is a 89 y.o. male here for ophthalmic surgery.   Past Medical History:  Diagnosis Date   Anxiety    Bell's palsy 04/10/2023   CAD (coronary artery disease)    a. 1995 s/p CABG x 4 (LIMA->LAD, VG->OM1->OM2, VG->RCA); b. 07/2009 PCI: LM 60, LAD 100ost, LCX 100p, RCA 100, LIMA->LAD atretic, VG->OM1->OM2 patent w/ collats to dLAD, VG->RCA 95 (3.5x15 Vision BMS). EF 55%; c. 02/2021 MV: EF 30% (50-55% by echo), large inferolateral scar w/ HK. No ischemia.   Chronic rhinitis    [facility]   COVID-19 virus infection 09/2019   Depression    Diabetes mellitus type II    Diastolic dysfunction    a. 03/2021 Echo: EF 50-55%, no rwma, GrI DD. RVSP 35.21mmHg. Mild MR.   Diverticulosis 06/2011   by CT and colonoscopy   Fatty liver 06/2011   by CT   GERD (gastroesophageal reflux disease)    GERD (gastroesophageal reflux disease)    History of BPH    History of shingles    HLD (hyperlipidemia)    HTN (hypertension)    IBS (irritable bowel syndrome)    Ischemic heart disease    Melanoma (HCC) 12/2015   L forearm, scalp   MI (myocardial infarction) (HCC) 1995   Right bundle branch block (RBBB)    S/P CABG (coronary artery bypass graft)    S/P CABG (coronary artery bypass graft)    Shingles rash 09/26/2012   Sialadenitis 05/01/2017   Stroke (HCC)    06/12/21, 06/27/21    Past Surgical History:  Procedure Laterality Date   ANKLE SURGERY  03/2002   Left ankle fracture   CARDIAC CATHETERIZATION     CATARACT EXTRACTION W/PHACO Left 06/18/2023   Procedure: PHACOEMULSIFICATION, CATARACT, WITH IOL INSERTION 6.02 00:35.5;  Surgeon: Annell Kidney, MD;  Location: [facility];  Service: Ophthalmology;  Laterality: Left;   CHOLECYSTECTOMY  1985   COLONOSCOPY     diverticulosis, tortuous colon with looping Peg Bouton)    CORONARY ARTERY BYPASS GRAFT  05/1993   x6 MI Attemped angioplasty   CORONARY STENT PLACEMENT  07/25/2009   Multi-Link VISION Cobalt chromium    CYSTOSCOPY  10/03/08   Normal-Dr. Cherylene Corrente   INGUINAL HERNIA REPAIR  11/2000   and ventral hernia repair    Prior to Admission medications   Medication Sig Start Date End Date Taking? Authorizing Provider  Alpha-Lipoic Acid 600 MG CAPS Take 1 capsule (600 mg total) by mouth daily. 09/03/22  Yes Claire Crick, MD  aspirin  EC 81 MG tablet Take 1 tablet (81 mg total) by mouth every Monday, Wednesday, and Friday. Swallow whole. 03/04/22  Yes Claire Crick, MD  atorvastatin  (LIPITOR) 40 MG tablet Take 1 tablet (40 mg total) by mouth daily. 03/14/23  Yes Claire Crick, MD  DULoxetine  (CYMBALTA ) 30 MG capsule Take 1 capsule (30 mg total) by mouth daily. 03/14/23  Yes Claire Crick, MD  finasteride  (PROSCAR ) 5 MG tablet Take 1 tablet (5 mg total) by mouth daily. 03/14/23  Yes Claire Crick, MD  fluticasone  (FLONASE ) 50 MCG/ACT nasal spray SPRAY 2 SPRAYS INTO EACH NOSTRIL EVERY DAY 10/29/21  Yes Claire Crick, MD  furosemide  (LASIX ) 20 MG tablet Take 1 tablet (20 mg total) by mouth daily. 03/14/23  Yes Claire Crick, MD  gabapentin  (NEURONTIN ) 300 MG capsule Take 2 capsules (600 mg total) by mouth  at bedtime. 03/07/23  Yes Claire Crick, MD  isosorbide  mononitrate (IMDUR ) 30 MG 24 hr tablet TAKE 1/2 OF A TABLET (15 MG TOTAL) BY MOUTH DAILY 05/12/23 05/11/24 Yes Gollan, Timothy J, MD  losartan  (COZAAR ) 50 MG tablet Take 1 tablet (50 mg total) by mouth daily. 03/14/23  Yes Claire Crick, MD  metFORMIN  (GLUCOPHAGE ) 500 MG tablet Take 1 tablet (500 mg total) by mouth 2 (two) times daily with a meal. Patient taking differently: Take 500 mg by mouth 2 (two) times daily with a meal. Only takes 1 in AM 03/14/23  Yes Claire Crick, MD  mirabegron  ER (MYRBETRIQ ) 25 MG TB24 tablet Take 1 tablet (25 mg total) by mouth daily. 09/03/22  Yes  Claire Crick, MD  traZODone  (DESYREL ) 50 MG tablet Take 1.5 tablets (75 mg total) by mouth at bedtime. 03/14/23  Yes Claire Crick, MD  Accu-Chek Softclix Lancets lancets Use as instructed to check sugars once daily E11.42 03/20/22   Claire Crick, MD  clopidogrel  (PLAVIX ) 75 MG tablet Take 1 tablet (75 mg total) by mouth daily. 03/04/22   Claire Crick, MD  Cranberry 500 MG CAPS Take 2 capsules (1,000 mg total) by mouth daily. 02/03/19   Claire Crick, MD  glucose blood (ACCU-CHEK AVIVA PLUS) test strip Use as instructed to check sugars daily E11.42 03/20/22   Claire Crick, MD  hydrocortisone 2.5 % cream Apply 1 Application topically 2 (two) times daily. 03/03/23   [provider]  ipratropium (ATROVENT ) 0.06 % nasal spray Place 2 sprays into both nostrils 4 (four) times daily. 07/17/22   Floydene Hy, PA-C  ketoconazole (NIZORAL) 2 % cream Apply topically. 06/25/21   [provider]  mupirocin  ointment (BACTROBAN ) 2 % Apply 1 Application topically 2 (two) times daily. 03/03/23   [provider]  polyethylene glycol powder (GLYCOLAX /MIRALAX ) 17 GM/SCOOP powder TAKE 8.5-17 G BY MOUTH DAILY AS NEEDED FOR MODERATE CONSTIPATION. 10/18/20   Claire Crick, MD    Allergies as of 05/14/2023 - Review Complete 04/16/2023  Allergen Reaction Noted   Morphine sulfate     Nitroglycerin     Contrast media [iodinated contrast media] Rash 07/23/2011    Family History  Problem Relation Age of Onset   Kidney failure Mother    Coronary artery disease Mother    Heart failure Mother    Hypertension Brother    Heart attack Brother    Alcohol abuse Brother    Hypertension Brother    Hypertension Sister     Social History   Socioeconomic History   Marital status: Married    Spouse name: Not on file   Number of children: Not on file   Years of education: Not on file   Highest education level: Not on file  Occupational History   Occupation: Education officer, environmental   Tobacco Use   Smoking status: Never   Smokeless tobacco: Never  Vaping Use   Vaping status: Never Used  Substance and Sexual Activity   Alcohol use: No    Alcohol/week: 0.0 standard drinks of alcohol   Drug use: No   Sexual activity: Not Currently  Other Topics Concern   Not on file  Social History Narrative   Caffeine: 1 cup/day, 2 cups tea daily   Lives with Marily Shows (wife) and 3 dogs   Occupation: Education officer, 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[place], 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   Activity: no regular exercise, does walk some   Diet: good water, fruits/vegetables daily   Social Drivers of Corporate investment banker Strain:  Low Risk  (08/16/2021)   Overall Financial Resource Strain (CARDIA)    Difficulty of Paying Living Expenses: Not hard at all  Food Insecurity: No Food Insecurity (04/10/2023)   Hunger Vital Sign    Worried About Running Out of Food in the Last Year: Never true    Ran Out of Food in the Last Year: Never true  Transportation Needs: No Transportation Needs (04/10/2023)   PRAPARE - Administrator, Civil Service (Medical): No    Lack of Transportation (Non-Medical): No  Physical Activity: Sufficiently Active (08/16/2021)   Exercise Vital Sign    Days of Exercise per Week: 5 days    Minutes of Exercise per Session: 30 min  Stress: No Stress Concern Present (08/16/2021)   [employer] of Occupational Health - Occupational Stress Questionnaire    Feeling of Stress : Not at all  Social Connections: Socially Integrated (04/10/2023)   Social Connection and Isolation Panel [NHANES]    Frequency of Communication with Friends and Family: More than three times a week    Frequency of Social Gatherings with Friends and Family: More than three times a week    Attends Religious Services: More than 4 times per year    Active Member of [employer] or Organizations: Yes    Attends Engineer, structural: More than 4 times per year    Marital Status: Married  Catering manager Violence: Not At Risk  (04/10/2023)   Humiliation, Afraid, Rape, and Kick questionnaire    Fear of Current or Ex-Partner: No    Emotionally Abused: No    Physically Abused: No    Sexually Abused: No    Review of Systems: See HPI, otherwise negative ROS  Physical Exam: BP (!) 163/90   Pulse 64   Temp 97.7 F (36.5 C) (Temporal)   Wt 80.2 kg   SpO2 97%   BMI 25.38 kg/m  General:   Alert,  pleasant and cooperative in NAD Head:  Normocephalic and atraumatic. Lungs:  Clear to auscultation.    Heart:  Regular rate and rhythm.   Impression/Plan: Aaron Stafford is here for ophthalmic surgery.  Risks, benefits, limitations, and alternatives regarding ophthalmic surgery have been reviewed with the patient.  Questions have been answered.  All parties agreeable.   Annell Kidney, MD  07/02/2023, 9:49 AM

## 2023-07-02 NOTE — Op Note (Signed)
 LOCATION:  [facility]   PREOPERATIVE DIAGNOSIS:    Nuclear sclerotic cataract right eye. H25.11   POSTOPERATIVE DIAGNOSIS:  Nuclear sclerotic cataract right eye.     PROCEDURE:  Phacoemusification with posterior chamber intraocular lens placement of the right eye   ULTRASOUND TIME: Procedure(s): PHACOEMULSIFICATION, CATARACT, WITH IOL INSERTION 7.06 00:33.6 (Right)  LENS:   Implant Name Type Inv. Item Serial No. Manufacturer Lot No. LRB No. Used Action  LENS IOL TECNIS EYHANCE 21.0 - [id number] Intraocular Lens LENS IOL TECNIS EYHANCE 21.0 [id number] [employer]  Right 1 Implanted         SURGEON:  Berline Brenner, MD   ANESTHESIA:  Topical with tetracaine  drops and 2% Xylocaine  jelly, augmented with 1% preservative-free intracameral lidocaine .    COMPLICATIONS:  None.   DESCRIPTION OF PROCEDURE:  The patient was identified in the holding room and transported to the operating room and placed in the supine position under the operating microscope.  The right eye was identified as the operative eye and it was prepped and draped in the usual sterile ophthalmic fashion.   A 1 millimeter clear-corneal paracentesis was made at the 12:00 position.  0.5 ml of preservative-free 1% lidocaine  was injected into the anterior chamber. The anterior chamber was filled with Viscoat viscoelastic.  A 2.4 millimeter keratome was used to make a near-clear corneal incision at the 9:00 position.  A curvilinear capsulorrhexis was made with a cystotome and capsulorrhexis forceps.  Balanced salt  solution was used to hydrodissect and hydrodelineate the nucleus.   Phacoemulsification was then used in stop and chop fashion to remove the lens nucleus and epinucleus.  The remaining cortex was then removed using the irrigation and aspiration handpiece. Provisc was then placed into the capsular bag to distend it for lens placement.  A lens was then injected into the capsular bag.  The remaining  viscoelastic was aspirated.   Wounds were hydrated with balanced salt  solution.  The anterior chamber was inflated to a physiologic pressure with balanced salt  solution.  No wound leaks were noted. Cefuroxime  0.1 ml of a 10mg /ml solution was injected into the anterior chamber for a dose of 1 mg of intracameral antibiotic at the completion of the case.   Timolol  and Brimonidine  drops were applied to the eye.  The patient was taken to the recovery room in stable condition without complications of anesthesia or surgery.   Latishia Suitt 07/02/2023, 10:49 AM

## 2023-07-03 ENCOUNTER — Encounter: Payer: Self-pay | Admitting: Ophthalmology

## 2023-07-24 ENCOUNTER — Ambulatory Visit: Payer: Self-pay

## 2023-07-24 ENCOUNTER — Encounter: Payer: Self-pay | Admitting: Family Medicine

## 2023-07-24 ENCOUNTER — Ambulatory Visit (INDEPENDENT_AMBULATORY_CARE_PROVIDER_SITE_OTHER): Payer: Medicare (Managed Care) | Admitting: Family Medicine

## 2023-07-24 VITALS — BP 140/64 | HR 59 | Temp 99.1°F | Ht 67.5 in | Wt 178.1 lb

## 2023-07-24 DIAGNOSIS — B353 Tinea pedis: Secondary | ICD-10-CM | POA: Diagnosis not present

## 2023-07-24 MED ORDER — CLOTRIMAZOLE 1 % EX CREA: 1.0000 | TOPICAL_CREAM | Freq: Two times a day (BID) | CUTANEOUS | 1 refills | Status: DC

## 2023-07-24 NOTE — Telephone Encounter (Signed)
 FYI Only or Action Required?: FYI only for provider  Patient was last seen in primary care on 04/16/2023 by Claire Crick, MD. Called Nurse Triage reporting Foot Swelling. Symptoms began several weeks ago. Interventions attempted: Nothing. Symptoms are: gradually worsening.  Triage Disposition: See HCP Within 4 Hours (Or PCP Triage)- Appointment Scheduled  Patient/caregiver understands and will follow disposition?: -Yes          Copied from CRM [id number]. Topic: Clinical - Red Word Triage >> Jul 24, 2023  9:41 AM Aaron Stafford wrote: Red Word that prompted transfer to Nurse Triage: Patient feet is turning a deep red and it was swelling last week. He states that it left a dark red line on his left foot. He thinks it could be a circulation issue.  Left foot worse Both red Dark streak across foot  Deep red- half of foot ( L)- Worse R giht foot same.Aaron Stafford less Onset: 3 weeks  Reason for Disposition  [1] Spreading redness or red streak AND [2] area > 2 in. (5 cm)  Answer Assessment - Initial Assessment Questions 1. SYMPTOM: "What's the main symptom you're concerned about?" (e.g., rash, sore, callus, drainage, numbness)     --- Redness  bilaterally and deep red streak across Left foot    2. LOCATION: "Where is the  located?" (e.g., foot/toe, top/bottom, left/right) -- Left and Right Foot       3. APPEARANCE: "What does the area look like?" (e.g., normal, red, swollen; size)     --- Redness of both feet. Worse on the L.    4. ONSET: "When did the   start?"     ---- 3 weeks ago     5. PAIN: "Is there any pain?" If Yes, ask: "How bad is it?" (Scale: 1-10; mild, moderate, severe)     -------Denies   6. CAUSE: "What do you think is causing the symptoms?"     -------Unsure   7. OTHER SYMPTOMS: "Do you have any other symptoms?" (e.g., fever, weakness) ----------- Denies        Additional Information: HX:  Open heart surgery, stent, 6 pbypasses.  Protocols used: Diabetes -  Foot Problems and Questions-A-AH

## 2023-07-24 NOTE — Telephone Encounter (Signed)
Noted. Will see.

## 2023-07-24 NOTE — Telephone Encounter (Addendum)
 Appreciate Dr Cherlyn Cornet seeing this pleasant patient.  I don't see  history of gout.

## 2023-07-24 NOTE — Progress Notes (Signed)
 Patient ID: Aaron Stafford, male    DOB: May 22, 1934, 89 y.o.   MRN: [id number]  This visit was conducted in person.  BP (!) 140/64   Pulse (!) 59   Temp 99.1 F (37.3 C) (Temporal)   Ht 5' 7.5" (1.715 m)   Wt 178 lb 2 oz (80.8 kg)   SpO2 97%   BMI 27.49 kg/m    CC:  Chief Complaint  Patient presents with   Foot Redness    Bilateral Feet x 1 month    Subjective:   HPI: Aaron Stafford is a 89 y.o. male patient of Dr. Mariam Shingles with history of type 2 diabetes, hypertension, coronary artery disease, congestive heart failure presenting on 07/24/2023 for Foot Redness (Bilateral Feet x 1 month)  He reports new onset bilateral foot redness x 1 month.     Has toenail fungus. Has noted dry flaky skin and red rash on bilateral feet. Left more than right, clear linear rash on left foot  Last night put on vicks vapor rub Feet cold .Aaron Aas Ongoing for a long time.. feels due to neuropathy.  Did have small amount of swelling last week, not now.  No associated pain.  No intermittent claudication symptoms.   Has diabetic neuropathy on gabapentin . On Plavix  for past CABG, CAD  Last echocardiogram April 11, 2023  EF 50-55%  Moderate left ventricle hypertrophy, grade 1 diastolic dysfunction.  Last OV cardiology Dr. Gollan 02/2023   Per pt saw vascular 3 years ago.. sounds like nml ABIs  Wt Readings from Last 3 Encounters:  07/24/23 178 lb 2 oz (80.8 kg)  07/02/23 176 lb 14.4 oz (80.2 kg)  06/18/23 174 lb (78.9 kg)    Relevant past medical, surgical, family and social history reviewed and updated as indicated. Interim medical history since our last visit reviewed. Allergies and medications reviewed and updated. Outpatient Medications Prior to Visit  Medication Sig Dispense Refill   Accu-Chek Softclix Lancets lancets Use as instructed to check sugars once daily E11.42 100 each 3   Alpha-Lipoic Acid 600 MG CAPS Take 1 capsule (600 mg total) by mouth daily.     aspirin  EC 81 MG  tablet Take 1 tablet (81 mg total) by mouth every Monday, Wednesday, and Friday. Swallow whole.     atorvastatin  (LIPITOR) 40 MG tablet Take 1 tablet (40 mg total) by mouth daily. 90 tablet 1   clopidogrel  (PLAVIX ) 75 MG tablet Take 1 tablet (75 mg total) by mouth daily.     Cranberry 500 MG CAPS Take 2 capsules (1,000 mg total) by mouth daily.     DULoxetine  (CYMBALTA ) 30 MG capsule Take 1 capsule (30 mg total) by mouth daily. 90 capsule 1   finasteride  (PROSCAR ) 5 MG tablet Take 1 tablet (5 mg total) by mouth daily. 90 tablet 1   fluticasone  (FLONASE ) 50 MCG/ACT nasal spray SPRAY 2 SPRAYS INTO EACH NOSTRIL EVERY DAY 48 mL 3   furosemide  (LASIX ) 20 MG tablet Take 1 tablet (20 mg total) by mouth daily. 90 tablet 1   gabapentin  (NEURONTIN ) 300 MG capsule Take 2 capsules (600 mg total) by mouth at bedtime.     glucose blood (ACCU-CHEK AVIVA PLUS) test strip Use as instructed to check sugars daily E11.42 100 each 3   hydrocortisone 2.5 % cream Apply 1 Application topically 2 (two) times daily.     ipratropium (ATROVENT ) 0.06 % nasal spray Place 2 sprays into both nostrils 4 (four) times daily. 15 mL 0  isosorbide  mononitrate (IMDUR ) 30 MG 24 hr tablet TAKE 1/2 OF A TABLET (15 MG TOTAL) BY MOUTH DAILY 45 tablet 3   ketoconazole (NIZORAL) 2 % cream Apply topically.     losartan  (COZAAR ) 50 MG tablet Take 1 tablet (50 mg total) by mouth daily. 90 tablet 1   metFORMIN  (GLUCOPHAGE ) 500 MG tablet Take 1 tablet (500 mg total) by mouth 2 (two) times daily with a meal. (Patient taking differently: Take 500 mg by mouth 2 (two) times daily with a meal. Only takes 1 in AM) 180 tablet 1   mirabegron  ER (MYRBETRIQ ) 25 MG TB24 tablet Take 1 tablet (25 mg total) by mouth daily. 30 tablet 3   mupirocin  ointment (BACTROBAN ) 2 % Apply 1 Application topically 2 (two) times daily.     polyethylene glycol powder (GLYCOLAX /MIRALAX ) 17 GM/SCOOP powder TAKE 8.5-17 G BY MOUTH DAILY AS NEEDED FOR MODERATE CONSTIPATION. 510 g 1    traZODone  (DESYREL ) 50 MG tablet Take 1.5 tablets (75 mg total) by mouth at bedtime. 135 tablet 1   No facility-administered medications prior to visit.     Per HPI unless specifically indicated in ROS section below Review of Systems  Constitutional:  Negative for fatigue and fever.  HENT:  Negative for ear pain.   Eyes:  Negative for pain.  Respiratory:  Negative for cough and shortness of breath.   Cardiovascular:  Negative for chest pain, palpitations and leg swelling.  Gastrointestinal:  Negative for abdominal pain.  Genitourinary:  Negative for dysuria.  Musculoskeletal:  Negative for arthralgias.  Skin:  Positive for color change and rash.  Neurological:  Negative for syncope, light-headedness and headaches.  Psychiatric/Behavioral:  Negative for dysphoric mood.    Objective:  BP (!) 140/64   Pulse (!) 59   Temp 99.1 F (37.3 C) (Temporal)   Ht 5' 7.5" (1.715 m)   Wt 178 lb 2 oz (80.8 kg)   SpO2 97%   BMI 27.49 kg/m   Wt Readings from Last 3 Encounters:  07/24/23 178 lb 2 oz (80.8 kg)  07/02/23 176 lb 14.4 oz (80.2 kg)  06/18/23 174 lb (78.9 kg)      Physical Exam Vitals reviewed.  Constitutional:      Appearance: He is well-developed.  HENT:     Head: Normocephalic.     Right Ear: Hearing normal.     Left Ear: Hearing normal.     Nose: Nose normal.  Neck:     Thyroid : No thyroid  mass or thyromegaly.     Vascular: No carotid bruit.     Trachea: Trachea normal.  Cardiovascular:     Rate and Rhythm: Normal rate and regular rhythm.     Pulses: Normal pulses.     Heart sounds: Heart sounds not distant. No murmur heard.    No friction rub. No gallop.     Comments: No peripheral edema Pulmonary:     Effort: Pulmonary effort is normal. No respiratory distress.     Breath sounds: Normal breath sounds.  Skin:    General: Skin is warm and dry.     Findings: No rash.  Psychiatric:        Speech: Speech normal.        Behavior: Behavior normal.         Thought Content: Thought content normal.          Results for orders placed or performed during the hospital encounter of 07/02/23  Glucose, capillary   Collection Time: 07/02/23  9:33 AM  Result Value Ref Range   Glucose-Capillary 125 (H) 70 - 99 mg/dL   Comment 1 Notify RN     Assessment and Plan  There are no diagnoses linked to this encounter.  No follow-ups on file.   Herby Lolling, MD

## 2023-07-24 NOTE — Assessment & Plan Note (Signed)
 Acute Patient also with significant chronic onychomycosis. Clear leading edge on left foot. Patient does also have neuropathy which could cause some color changes in feet.  He denies symptoms of intermittent claudication but is feet do remain cold.  If symptoms or not improving with treatment of likely tinea pedis, consider repeat ABIs for blood flow. Will treat with clotrimazole 1% twice daily for 2 weeks, continue 48 to 72 hours after rash resolved. Follow-up with Dr. Crissie Dome if not improving as expected.

## 2023-08-12 DIAGNOSIS — H524 Presbyopia: Secondary | ICD-10-CM | POA: Diagnosis not present

## 2023-08-18 ENCOUNTER — Encounter: Payer: Self-pay | Admitting: Family Medicine

## 2023-08-18 ENCOUNTER — Ambulatory Visit (INDEPENDENT_AMBULATORY_CARE_PROVIDER_SITE_OTHER): Payer: Medicare (Managed Care) | Admitting: Family Medicine

## 2023-08-18 VITALS — BP 136/68 | HR 63 | Temp 97.6°F | Ht 67.5 in | Wt 177.5 lb

## 2023-08-18 DIAGNOSIS — B353 Tinea pedis: Secondary | ICD-10-CM | POA: Diagnosis not present

## 2023-08-18 DIAGNOSIS — G2581 Restless legs syndrome: Secondary | ICD-10-CM | POA: Diagnosis not present

## 2023-08-18 NOTE — Assessment & Plan Note (Addendum)
 Significant improvement with clotrimazole  treatment - continue this.  Discussed intensifying treatment with nail laquer or oral antifungal, he declines at this time.  He's stopped foot sauna use

## 2023-08-18 NOTE — Progress Notes (Signed)
 Ph: [phone] Fax: [phone]   Patient ID: Aaron Stafford, male    DOB: 07/21/1934, 89 y.o.   MRN: [id number]  This visit was conducted in person.  BP 136/68   Pulse 63   Temp 97.6 F (36.4 C) (Oral)   Ht 5' 7.5 (1.715 m)   Wt 177 lb 8 oz (80.5 kg)   SpO2 96%   BMI 27.39 kg/m    CC: f/u tinea pedis  Subjective:   HPI: Aaron Stafford is a 89 y.o. male presenting on 08/18/2023 for Medical Management of Chronic Issues (Here for tinea pedis f/u- now just L foot. )   See prior note for details. Saw Dr Avelina 07/24/2023 with 1 month of red rash to bilateral feet dx tinea pedis treated with clotrimazole  1% BID x2 wks. Also noted tinea ungum.   Overall skin rash significantly improved however persistent thickening of nails.  He continues using clotrimazole  cream twice daily.   Overall asxs at this time however notes some jerking shakes at night time so he increased gabapentin  to 900mg  at night with some benefit. Also takes trazodone  at night time. This has been effective.  Known diabetic neuropathy on gabapentin . Denies claudication symptoms.   He does note he was using foot sauna with hot water.    Feels bell's palsy is 98% gone.      Relevant past medical, surgical, family and social history reviewed and updated as indicated. Interim medical history since our last visit reviewed. Allergies and medications reviewed and updated. Outpatient Medications Prior to Visit  Medication Sig Dispense Refill   Accu-Chek Softclix Lancets lancets Use as instructed to check sugars once daily E11.42 100 each 3   Alpha-Lipoic Acid 600 MG CAPS Take 1 capsule (600 mg total) by mouth daily.     aspirin  EC 81 MG tablet Take 1 tablet (81 mg total) by mouth every Monday, Wednesday, and Friday. Swallow whole.     atorvastatin  (LIPITOR) 40 MG tablet Take 1 tablet (40 mg total) by mouth daily. 90 tablet 1   clopidogrel  (PLAVIX ) 75 MG tablet Take 1 tablet (75 mg total) by mouth daily.      clotrimazole  (LOTRIMIN ) 1 % cream Apply 1 Application topically 2 (two) times daily. 30 g 1   Cranberry 500 MG CAPS Take 2 capsules (1,000 mg total) by mouth daily.     DULoxetine  (CYMBALTA ) 30 MG capsule Take 1 capsule (30 mg total) by mouth daily. 90 capsule 1   finasteride  (PROSCAR ) 5 MG tablet Take 1 tablet (5 mg total) by mouth daily. 90 tablet 1   fluticasone  (FLONASE ) 50 MCG/ACT nasal spray SPRAY 2 SPRAYS INTO EACH NOSTRIL EVERY DAY 48 mL 3   furosemide  (LASIX ) 20 MG tablet Take 1 tablet (20 mg total) by mouth daily. 90 tablet 1   glucose blood (ACCU-CHEK AVIVA PLUS) test strip Use as instructed to check sugars daily E11.42 100 each 3   hydrocortisone 2.5 % cream Apply 1 Application topically 2 (two) times daily.     ipratropium (ATROVENT ) 0.06 % nasal spray Place 2 sprays into both nostrils 4 (four) times daily. 15 mL 0   isosorbide  mononitrate (IMDUR ) 30 MG 24 hr tablet TAKE 1/2 OF A TABLET (15 MG TOTAL) BY MOUTH DAILY 45 tablet 3   ketoconazole (NIZORAL) 2 % cream Apply topically.     losartan  (COZAAR ) 50 MG tablet Take 1 tablet (50 mg total) by mouth daily. 90 tablet 1   metFORMIN  (GLUCOPHAGE ) 500 MG  tablet Take 1 tablet (500 mg total) by mouth 2 (two) times daily with a meal. (Patient taking differently: Take 500 mg by mouth 2 (two) times daily with a meal. Only takes 1 in AM) 180 tablet 1   mirabegron  ER (MYRBETRIQ ) 25 MG TB24 tablet Take 1 tablet (25 mg total) by mouth daily. 30 tablet 3   mupirocin  ointment (BACTROBAN ) 2 % Apply 1 Application topically 2 (two) times daily.     polyethylene glycol powder (GLYCOLAX /MIRALAX ) 17 GM/SCOOP powder TAKE 8.5-17 G BY MOUTH DAILY AS NEEDED FOR MODERATE CONSTIPATION. 510 g 1   traZODone  (DESYREL ) 50 MG tablet Take 1.5 tablets (75 mg total) by mouth at bedtime. 135 tablet 1   gabapentin  (NEURONTIN ) 300 MG capsule Take 2 capsules (600 mg total) by mouth at bedtime.     gabapentin  (NEURONTIN ) 300 MG capsule Take 2-3 capsules (600-900 mg total) by  mouth at bedtime.     No facility-administered medications prior to visit.     Per HPI unless specifically indicated in ROS section below Review of Systems  Objective:  BP 136/68   Pulse 63   Temp 97.6 F (36.4 C) (Oral)   Ht 5' 7.5 (1.715 m)   Wt 177 lb 8 oz (80.5 kg)   SpO2 96%   BMI 27.39 kg/m   Wt Readings from Last 3 Encounters:  08/18/23 177 lb 8 oz (80.5 kg)  07/24/23 178 lb 2 oz (80.8 kg)  07/02/23 176 lb 14.4 oz (80.2 kg)      Physical Exam Vitals and nursing note reviewed.  Constitutional:      Appearance: Normal appearance. He is not ill-appearing.   Musculoskeletal:     Comments:  Diminished pulses BLE but still present See foot exam below.   Skin:    General: Skin is warm and dry.     Findings: Rash present.     Comments:  Significant improvement in rash to bilateral feet, L>R  Thickened onychomycotic toenails bilaterally   Neurological:     Mental Status: He is alert.   Psychiatric:        Mood and Affect: Mood normal.        Behavior: Behavior normal.    Diabetic Foot Exam - Simple   Simple Foot Form Diabetic Foot exam was performed with the following findings: Yes 08/18/2023  9:59 AM  Visual Inspection See comments: Yes Sensation Testing See comments: Yes Pulse Check See comments: Yes Comments Thickened toenails bilaterally Diminished pulses bilaterally Sensation intact to light touch Maceration between 4th/5th toes R>L            Lab Results  Component Value Date   ALT 35 04/10/2023   AST 29 04/10/2023   ALKPHOS 59 04/10/2023   BILITOT 0.9 04/10/2023   Lab Results  Component Value Date   WBC 7.7 04/10/2023   HGB 14.3 04/10/2023   HCT 41.3 04/10/2023   MCV 87.3 04/10/2023   PLT 174 04/10/2023   No results found for: IRON, TIBC, FERRITIN  Lab Results  Component Value Date   HGBA1C 7.1 (A) 03/07/2023    Assessment & Plan:   Problem List Items Addressed This Visit     Tinea pedis of both feet - Primary    Significant improvement with clotrimazole  treatment - continue this.  Discussed intensifying treatment with nail laquer or oral antifungal, he declines at this time.  He's stopped foot sauna use      Restless legs   Describes feet and legs jerking -  unclear if voluntary or involuntary movements. Currently managed effectively with higher gabapentin  dose 900mg  nightly. Update iron panel next labs.         No orders of the defined types were placed in this encounter.   No orders of the defined types were placed in this encounter.   Patient Instructions  For leg movements - we will check iron levels next lab-work.  For fungal foot infection - continue clotrimazole  cream which is working. Let me know if interested in oral medicine for better nail effect.  Keep physical/labs appointment next month.   Follow up plan: No follow-ups on file.  Anton Blas, MD

## 2023-08-18 NOTE — Patient Instructions (Addendum)
 For leg movements - we will check iron levels next lab-work.  For fungal foot infection - continue clotrimazole  cream which is working. Let me know if interested in oral medicine for better nail effect.  Keep physical/labs appointment next month.

## 2023-08-18 NOTE — Assessment & Plan Note (Addendum)
 Describes feet and legs jerking - unclear if voluntary or involuntary movements. Currently managed effectively with higher gabapentin  dose 900mg  nightly. Update iron panel next labs.

## 2023-08-30 ENCOUNTER — Other Ambulatory Visit: Payer: Self-pay | Admitting: Family Medicine

## 2023-08-30 DIAGNOSIS — N138 Other obstructive and reflux uropathy: Secondary | ICD-10-CM

## 2023-08-30 DIAGNOSIS — E559 Vitamin D deficiency, unspecified: Secondary | ICD-10-CM

## 2023-08-30 DIAGNOSIS — E1140 Type 2 diabetes mellitus with diabetic neuropathy, unspecified: Secondary | ICD-10-CM

## 2023-08-30 DIAGNOSIS — E1169 Type 2 diabetes mellitus with other specified complication: Secondary | ICD-10-CM

## 2023-09-01 ENCOUNTER — Other Ambulatory Visit (INDEPENDENT_AMBULATORY_CARE_PROVIDER_SITE_OTHER): Payer: Medicare (Managed Care)

## 2023-09-01 DIAGNOSIS — E559 Vitamin D deficiency, unspecified: Secondary | ICD-10-CM

## 2023-09-01 DIAGNOSIS — E1140 Type 2 diabetes mellitus with diabetic neuropathy, unspecified: Secondary | ICD-10-CM | POA: Diagnosis not present

## 2023-09-01 DIAGNOSIS — E1169 Type 2 diabetes mellitus with other specified complication: Secondary | ICD-10-CM | POA: Diagnosis not present

## 2023-09-01 DIAGNOSIS — E785 Hyperlipidemia, unspecified: Secondary | ICD-10-CM | POA: Diagnosis not present

## 2023-09-01 LAB — LIPID PANEL
Cholesterol: 84 mg/dL (ref 0–200)
HDL: 33.3 mg/dL — ABNORMAL LOW (ref >39.00)
LDL Cholesterol: 31 mg/dL (ref 0–99)
NonHDL: 50.64
Total CHOL/HDL Ratio: 3
Triglycerides: 97 mg/dL (ref 0.0–149.0)
VLDL: 19.4 mg/dL (ref 0.0–40.0)

## 2023-09-01 LAB — COMPREHENSIVE METABOLIC PANEL WITH GFR
ALT: 23 U/L (ref 0–53)
AST: 19 U/L (ref 0–37)
Albumin: 4.1 g/dL (ref 3.5–5.2)
Alkaline Phosphatase: 63 U/L (ref 39–117)
BUN: 23 mg/dL (ref 6–23)
CO2: 29 meq/L (ref 19–32)
Calcium: 9.9 mg/dL (ref 8.4–10.5)
Chloride: 106 meq/L (ref 96–112)
Creatinine, Ser: 1.09 mg/dL (ref 0.40–1.50)
GFR: 61.02 mL/min (ref >60.00)
Glucose, Bld: 129 mg/dL — ABNORMAL HIGH (ref 70–99)
Potassium: 5.1 meq/L (ref 3.5–5.1)
Sodium: 142 meq/L (ref 135–145)
Total Bilirubin: 0.6 mg/dL (ref 0.2–1.2)
Total Protein: 6.2 g/dL (ref 6.0–8.3)

## 2023-09-01 LAB — MICROALBUMIN / CREATININE URINE RATIO
Creatinine,U: 88.5 mg/dL
Microalb Creat Ratio: 71 mg/g — ABNORMAL HIGH (ref 0.0–30.0)
Microalb, Ur: 6.3 mg/dL — ABNORMAL HIGH (ref 0.0–1.9)

## 2023-09-01 LAB — HEMOGLOBIN A1C: Hgb A1c MFr Bld: 7.3 % — ABNORMAL HIGH (ref 4.6–6.5)

## 2023-09-01 LAB — VITAMIN D 25 HYDROXY (VIT D DEFICIENCY, FRACTURES): VITD: 34.02 ng/mL (ref 30.00–100.00)

## 2023-09-01 LAB — VITAMIN B12: Vitamin B-12: 498 pg/mL (ref 211–911)

## 2023-09-02 ENCOUNTER — Ambulatory Visit: Payer: Self-pay | Admitting: Family Medicine

## 2023-09-04 ENCOUNTER — Other Ambulatory Visit: Payer: Self-pay

## 2023-09-04 ENCOUNTER — Other Ambulatory Visit: Payer: Self-pay | Admitting: Family Medicine

## 2023-09-04 DIAGNOSIS — I10 Essential (primary) hypertension: Secondary | ICD-10-CM

## 2023-09-04 DIAGNOSIS — E1169 Type 2 diabetes mellitus with other specified complication: Secondary | ICD-10-CM

## 2023-09-04 DIAGNOSIS — E1140 Type 2 diabetes mellitus with diabetic neuropathy, unspecified: Secondary | ICD-10-CM

## 2023-09-04 MED ORDER — LOSARTAN POTASSIUM 50 MG PO TABS: 50.0000 mg | ORAL_TABLET | Freq: Every day | ORAL | 0 refills | Status: DC

## 2023-09-04 MED ORDER — ATORVASTATIN CALCIUM 40 MG PO TABS: 40.0000 mg | ORAL_TABLET | Freq: Every day | ORAL | 0 refills | Status: DC

## 2023-09-05 ENCOUNTER — Ambulatory Visit: Payer: Medicare (Managed Care)

## 2023-09-05 VITALS — Ht 67.5 in | Wt 177.0 lb

## 2023-09-05 DIAGNOSIS — Z0000 Encounter for general adult medical examination without abnormal findings: Secondary | ICD-10-CM

## 2023-09-05 NOTE — Patient Instructions (Signed)
 [name] , Thank you for taking time out of your busy schedule to complete your Annual Wellness Visit with me. I enjoyed our conversation and look forward to speaking with you again next year. I, as well as your care team,  appreciate your ongoing commitment to your health goals. Please review the following plan we discussed and let me know if I can assist you in the future. Your Game plan/ To Do List    Follow up Visits: Next Medicare AWV with our clinical staff: 09/07/24 @ 2:20pm televisit   Have you seen your provider in the last 6 months (3 months if uncontrolled diabetes)? Yes Next Office Visit with your provider: 09/08/23  physical  Clinician Recommendations:  Aim for 30 minutes of exercise or brisk walking, 6-8 glasses of water, and 5 servings of fruits and vegetables each day.       This is a list of the screening recommended for you and due dates:  Health Maintenance  Topic Date Due   Zoster (Shingles) Vaccine (1 of 2) 02/21/1953   DTaP/Tdap/Td vaccine (3 - Tdap) 04/13/2019   COVID-19 Vaccine (4 - 2024-25 season) 10/20/2022   Flu Shot  09/19/2023   Hemoglobin A1C  03/03/2024   Eye exam for diabetics  05/11/2024   Complete foot exam   08/17/2024   Medicare Annual Wellness Visit  09/04/2024   Pneumococcal Vaccine for age over 32  Completed   Hepatitis B Vaccine  Aged Out   HPV Vaccine  Aged Out   Meningitis B Vaccine  Aged Out    Advanced directives: (Copy Requested) Please bring a copy of your health care power of attorney and living will to the office to be added to your chart at your convenience. You can mail to [facility] [address] [place], [place] [address] or email to [email or web address] Advance Care Planning is important because it:  [x]  Makes sure you receive the medical care that is consistent with your values, goals, and preferences  [x]  It provides guidance to your family and loved ones and reduces their decisional burden about  whether or not they are making the right decisions based on your wishes.  Follow the link provided in your after visit summary or read over the paperwork we have mailed to you to help you started getting your Advance Directives in place. If you need assistance in completing these, please reach out to us  so that we can help you!

## 2023-09-05 NOTE — Progress Notes (Signed)
 Subjective:   Aaron Stafford is a 89 y.o. who presents for a Medicare Wellness preventive visit.  As a reminder, Annual Wellness Visits don't include a physical exam, and some assessments may be limited, especially if this visit is performed virtually. We may recommend an in-person follow-up visit with your provider if needed.  Visit Complete: Virtual I connected with  Aaron Stafford on 09/05/23 by a audio enabled telemedicine application and verified that I am speaking with the correct person using two identifiers.  Patient Location: Home  Provider Location: Office/Clinic  I discussed the limitations of evaluation and management by telemedicine. The patient expressed understanding and agreed to proceed.  Vital Signs: Because this visit was a virtual/telehealth visit, some criteria may be missing or patient reported. Any vitals not documented were not able to be obtained and vitals that have been documented are patient reported.  VideoDeclined- This patient declined Librarian, academic. Therefore the visit was completed with audio only.  Persons Participating in Visit: Patient.  AWV Questionnaire: No: Patient Medicare AWV questionnaire was not completed prior to this visit.  Cardiac Risk Factors include: advanced age (>19men, >45 women);diabetes mellitus;dyslipidemia;hypertension;male gender;sedentary lifestyle     Objective:    Today's Vitals   09/05/23 1430  Weight: 177 lb (80.3 kg)  Height: 5' 7.5 (1.715 m)   Body mass index is 27.31 kg/m.     09/05/2023    2:40 PM 07/02/2023    9:30 AM 06/18/2023    8:09 AM 04/10/2023    2:00 PM 07/17/2022   11:54 AM 02/01/2022   11:55 AM 08/16/2021    9:35 AM  Advanced Directives  Does Patient Have a Medical Advance Directive? Yes Yes Yes No No Yes Yes  Type of Estate agent of [place];Living will Healthcare Power of [place];Living will Healthcare Power of [place];Living will    Healthcare Power of [place];Living will Healthcare Power of Attorney  Does patient want to make changes to medical advance directive?  No - Patient declined No - Patient declined      Copy of Healthcare Power of Attorney in Chart? No - copy requested Yes - validated most recent copy scanned in chart (See row information) Yes - validated most recent copy scanned in chart (See row information)    Yes - validated most recent copy scanned in chart (See row information)  Would patient like information on creating a medical advance directive?    No - Patient declined   No - Patient declined    Current Medications (verified) Outpatient Encounter Medications as of 09/05/2023  Medication Sig   Accu-Chek Softclix Lancets lancets Use as instructed to check sugars once daily E11.42   Alpha-Lipoic Acid 600 MG CAPS Take 1 capsule (600 mg total) by mouth daily.   aspirin  EC 81 MG tablet Take 1 tablet (81 mg total) by mouth every Monday, Wednesday, and Friday. Swallow whole.   atorvastatin  (LIPITOR) 40 MG tablet Take 1 tablet (40 mg total) by mouth daily.   clopidogrel  (PLAVIX ) 75 MG tablet Take 1 tablet (75 mg total) by mouth daily.   clotrimazole  (LOTRIMIN ) 1 % cream Apply 1 Application topically 2 (two) times daily.   Cranberry 500 MG CAPS Take 2 capsules (1,000 mg total) by mouth daily.   DULoxetine  (CYMBALTA ) 30 MG capsule Take 1 capsule (30 mg total) by mouth daily.   finasteride  (PROSCAR ) 5 MG tablet Take 1 tablet (5 mg total) by mouth daily.   fluticasone  (FLONASE ) 50 MCG/ACT  nasal spray SPRAY 2 SPRAYS INTO EACH NOSTRIL EVERY DAY   furosemide  (LASIX ) 20 MG tablet Take 1 tablet (20 mg total) by mouth daily.   gabapentin  (NEURONTIN ) 300 MG capsule Take 2-3 capsules (600-900 mg total) by mouth at bedtime.   glucose blood (ACCU-CHEK AVIVA PLUS) test strip Use as instructed to check sugars daily E11.42   hydrocortisone 2.5 % cream Apply 1 Application topically 2 (two) times daily.   ipratropium (ATROVENT )  0.06 % nasal spray Place 2 sprays into both nostrils 4 (four) times daily.   isosorbide  mononitrate (IMDUR ) 30 MG 24 hr tablet TAKE 1/2 OF A TABLET (15 MG TOTAL) BY MOUTH DAILY   ketoconazole (NIZORAL) 2 % cream Apply topically.   losartan  (COZAAR ) 50 MG tablet Take 1 tablet (50 mg total) by mouth daily.   metFORMIN  (GLUCOPHAGE ) 500 MG tablet Take 1 tablet (500 mg total) by mouth 2 (two) times daily with a meal. Only takes 1 in AM   mirabegron  ER (MYRBETRIQ ) 25 MG TB24 tablet Take 1 tablet (25 mg total) by mouth daily.   mupirocin  ointment (BACTROBAN ) 2 % Apply 1 Application topically 2 (two) times daily.   polyethylene glycol powder (GLYCOLAX /MIRALAX ) 17 GM/SCOOP powder TAKE 8.5-17 G BY MOUTH DAILY AS NEEDED FOR MODERATE CONSTIPATION.   traZODone  (DESYREL ) 50 MG tablet Take 1.5 tablets (75 mg total) by mouth at bedtime.   No facility-administered encounter medications on file as of 09/05/2023.    Allergies (verified) Morphine sulfate, Nitroglycerin, and Contrast media [iodinated contrast media]   History: Past Medical History:  Diagnosis Date   Anxiety    Bell's palsy 04/10/2023   CAD (coronary artery disease)    a. 1995 s/p CABG x 4 (LIMA->LAD, VG->OM1->OM2, VG->RCA); b. 07/2009 PCI: LM 60, LAD 100ost, LCX 100p, RCA 100, LIMA->LAD atretic, VG->OM1->OM2 patent w/ collats to dLAD, VG->RCA 95 (3.5x15 Vision BMS). EF 55%; c. 02/2021 MV: EF 30% (50-55% by echo), large inferolateral scar w/ HK. No ischemia.   Chronic rhinitis    ENT [facility]   COVID-19 virus infection 09/2019   Depression    Diabetes mellitus type II    Diastolic dysfunction    a. 03/2021 Echo: EF 50-55%, no rwma, GrI DD. RVSP 35.52mmHg. Mild MR.   Diverticulosis 06/2011   by CT and colonoscopy   Fatty liver 06/2011   by CT   GERD (gastroesophageal reflux disease)    GERD (gastroesophageal reflux disease)    History of BPH    History of shingles    HLD (hyperlipidemia)    HTN (hypertension)    IBS (irritable bowel  syndrome)    Ischemic heart disease    Melanoma (HCC) 12/2015   L forearm, scalp   MI (myocardial infarction) (HCC) 1995   Right bundle branch block (RBBB)    S/P CABG (coronary artery bypass graft)    S/P CABG (coronary artery bypass graft)    Shingles rash 09/26/2012   Sialadenitis 05/01/2017   Stroke (HCC)    06/12/21, 06/27/21   Past Surgical History:  Procedure Laterality Date   ANKLE SURGERY  03/2002   Left ankle fracture   CARDIAC CATHETERIZATION     CATARACT EXTRACTION W/PHACO Left 06/18/2023   Procedure: PHACOEMULSIFICATION, CATARACT, WITH IOL INSERTION 6.02 00:35.5;  Surgeon: Mittie Gaskin, MD;  Location: [facility];  Service: Ophthalmology;  Laterality: Left;   CATARACT EXTRACTION W/PHACO Right 07/02/2023   Procedure: PHACOEMULSIFICATION, CATARACT, WITH IOL INSERTION 7.06 00:33.6;  Surgeon: Mittie Gaskin, MD;  Location: [facility];  Service: Ophthalmology;  Laterality: Right;   CHOLECYSTECTOMY  1985   COLONOSCOPY     diverticulosis, tortuous colon with looping ([place])   CORONARY ARTERY BYPASS GRAFT  05/1993   x6 MI Attemped angioplasty   CORONARY STENT PLACEMENT  07/25/2009   Multi-Link VISION Cobalt chromium    CYSTOSCOPY  10/03/08   Normal-Dr. Twylla   INGUINAL HERNIA REPAIR  11/2000   and ventral hernia repair   Family History  Problem Relation Age of Onset   Kidney failure Mother    Coronary artery disease Mother    Heart failure Mother    Hypertension Brother    Heart attack Brother    Alcohol abuse Brother    Hypertension Brother    Hypertension Sister    Social History   Socioeconomic History   Marital status: Married    Spouse name: Not on file   Number of children: Not on file   Years of education: Not on file   Highest education level: Not on file  Occupational History   Occupation: Education officer, environmental  Tobacco Use   Smoking status: Never   Smokeless tobacco: Never  Vaping Use   Vaping status: Never Used  Substance and  Sexual Activity   Alcohol use: No    Alcohol/week: 0.0 standard drinks of alcohol   Drug use: No   Sexual activity: Not Currently  Other Topics Concern   Not on file  Social History Narrative   Caffeine: 1 cup/day, 2 cups tea daily   Lives with Dagoberto (wife) and 3 dogs   Occupation: Education officer, 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[place], 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   Activity: no regular exercise, does walk some   Diet: good water, fruits/vegetables daily   Social Drivers of Corporate investment banker Strain: Low Risk  (09/05/2023)   Overall Financial Resource Strain (CARDIA)    Difficulty of Paying Living Expenses: Not hard at all  Food Insecurity: No Food Insecurity (09/05/2023)   Hunger Vital Sign    Worried About Running Out of Food in the Last Year: Never true    Ran Out of Food in the Last Year: Never true  Transportation Needs: No Transportation Needs (09/05/2023)   PRAPARE - Administrator, Civil Service (Medical): No    Lack of Transportation (Non-Medical): No  Physical Activity: Sufficiently Active (09/05/2023)   Exercise Vital Sign    Days of Exercise per Week: 5 days    Minutes of Exercise per Session: 30 min  Stress: No Stress Concern Present (09/05/2023)   [employer] of Occupational Health - Occupational Stress Questionnaire    Feeling of Stress: Not at all  Social Connections: Socially Integrated (09/05/2023)   Social Connection and Isolation Panel    Frequency of Communication with Friends and Family: More than three times a week    Frequency of Social Gatherings with Friends and Family: More than three times a week    Attends Religious Services: More than 4 times per year    Active Member of [employer] or Organizations: Yes    Attends Engineer, structural: More than 4 times per year    Marital Status: Married    Tobacco Counseling Counseling given: Not Answered    Clinical Intake:  Pre-visit preparation completed: Yes  Pain : No/denies pain     BMI - recorded:  27.31 Nutritional Status: BMI 25 -29 Overweight Nutritional Risks: None Diabetes: Yes CBG done?: No Did pt. bring in CBG monitor from home?: No  Lab Results  Component  Value Date   HGBA1C 7.3 (H) 09/01/2023   HGBA1C 7.1 (A) 03/07/2023   HGBA1C 7.0 (H) 09/03/2022     How often do you need to have someone help you when you read instructions, pamphlets, or other written materials from your doctor or pharmacy?: 1 - Never  Interpreter Needed?: No  Comments: lives with wife Information entered by :: B.Katerin Negrete,LPN   Activities of Daily Living     09/05/2023    2:41 PM 07/02/2023    9:24 AM  In your present state of health, do you have any difficulty performing the following activities:  Hearing? 0 0  Vision? 0 0  Difficulty concentrating or making decisions? 0 0  Walking or climbing stairs? 0   Dressing or bathing? 0   Doing errands, shopping? 0   Preparing Food and eating ? N   Using the Toilet? N   In the past six months, have you accidently leaked urine? N   Do you have problems with loss of bowel control? N   Managing your Medications? N   Managing your Finances? N   Housekeeping or managing your Housekeeping? N     Patient Care Team: Rilla Baller, MD as PCP - General (Family Medicine) Perla Evalene PARAS, MD as PCP - Cardiology (Cardiology) Rik Channel, MD as Consulting Physician (Ophthalmology) Perla Evalene PARAS, MD as Consulting Physician (Cardiology) Myra Rosaline FALCON, [facility] (Inactive) as Pharmacist (Pharmacist)  I have updated your Care Teams any recent Medical Services you may have received from other providers in the past year.     Assessment:   This is a routine wellness examination for Aaron Stafford.  Hearing/Vision screen Hearing Screening - Comments:: Pt says his hearing is good Vision Screening - Comments:: Pt says his vision is good;readers only   Goals Addressed             This Visit's Progress    Follow up with Primary Care Provider   On  track    Starting 08/10/18, I will continue to take medications as prescribed and to keep appointments with PCP as scheduled.      Patient Stated   On track    09/05/23- I will maintain and continue medications as prescribed.      COMPLETED: Patient Stated       08/15/2020, I will maintain and continue medications as prescribed.      Patient Stated   On track    09/05/23-Keep life going the way it is       Depression Screen     09/05/2023    2:37 PM 08/18/2023    9:57 AM 04/16/2023   11:23 AM 03/07/2023   10:36 AM 03/04/2022    8:53 AM 08/16/2021    9:42 AM 08/16/2021    9:41 AM  PHQ 2/9 Scores  PHQ - 2 Score 0 0 0 0 0 0 0  PHQ- 9 Score   3 1       Fall Risk     09/05/2023    2:34 PM 08/18/2023    9:57 AM 03/11/2023    3:50 PM 03/04/2022    8:54 AM 08/16/2021    9:36 AM  Fall Risk   Falls in the past year? 0 0 0 1 0  Number falls in past yr: 0  0 1 0  Injury with Fall? 0  0 0 0  Risk for fall due to : No Fall Risks  No Fall Risks    Follow up Falls prevention  discussed;Education provided  Falls evaluation completed  Falls evaluation completed;Education provided;Falls prevention discussed      Data saved with a previous flowsheet row definition    MEDICARE RISK AT HOME:  Medicare Risk at Home Any stairs in or around the home?: Yes If so, are there any without handrails?: Yes Home free of loose throw rugs in walkways, pet beds, electrical cords, etc?: Yes Adequate lighting in your home to reduce risk of falls?: Yes Life alert?: No Use of a cane, walker or w/c?: No Grab bars in the bathroom?: Yes Shower chair or bench in shower?: Yes Elevated toilet seat or a handicapped toilet?: No  TIMED UP AND GO:  Was the test performed?  No  Cognitive Function: 6CIT completed    08/15/2020    9:57 AM 08/13/2019    9:49 AM 08/07/2018    9:45 AM 03/04/2017    9:59 AM 12/26/2015    2:13 PM  MMSE - Mini Mental State Exam  Orientation to time 5 5 5 5  5    Orientation to Place 5 5 5 5   5    Registration 3 3 3 3  3    Attention/ Calculation 5 4 0 0  0   Recall 3 3 3 3  3    Language- name 2 objects   0 0  0   Language- repeat 1 1 1 1 1   Language- follow 3 step command   0 3  3   Language- read & follow direction   0 0  0   Write a sentence   0 0  0   Copy design   0 0  0   Total score   17 20  20       Data saved with a previous flowsheet row definition        09/05/2023    2:43 PM 08/16/2021    9:38 AM  6CIT Screen  What Year? 0 points 0 points  What month? 0 points 0 points  What time? 0 points 0 points  Count back from 20 0 points 0 points  Months in reverse 0 points 0 points  Repeat phrase 8 points 0 points  Total Score 8 points 0 points    Immunizations Immunization History  Administered Date(s) Administered   Fluad Trivalent(High Dose 65+) 03/07/2023   Influenza Split 11/21/2011   Influenza Whole 11/19/2003, 03/01/2009   Influenza, High Dose Seasonal PF 10/03/2016, 10/27/2017, 12/03/2018   Influenza,inj,Quad PF,6+ Mos 11/23/2012, 02/15/2014, 12/14/2014, 12/21/2015   PFIZER(Purple Top)SARS-COV-2 Vaccination 03/09/2019, 04/03/2019, 02/23/2020   Pneumococcal Conjugate-13 08/09/2013   Pneumococcal Polysaccharide-23 05/10/2004, 08/22/2020   Td 12/26/1997, 04/12/2009   Zoster, Live 11/15/2010    Screening Tests Health Maintenance  Topic Date Due   Zoster Vaccines- Shingrix (1 of 2) 02/21/1953   DTaP/Tdap/Td (3 - Tdap) 04/13/2019   COVID-19 Vaccine (4 - 2024-25 season) 10/20/2022   INFLUENZA VACCINE  09/19/2023   HEMOGLOBIN A1C  03/03/2024   OPHTHALMOLOGY EXAM  05/11/2024   FOOT EXAM  08/17/2024   Medicare Annual Wellness (AWV)  09/04/2024   Pneumococcal Vaccine: 50+ Years  Completed   Hepatitis B Vaccines  Aged Out   HPV VACCINES  Aged Out   Meningococcal B Vaccine  Aged Out    Health Maintenance  Health Maintenance Due  Topic Date Due   Zoster Vaccines- Shingrix (1 of 2) 02/21/1953   DTaP/Tdap/Td (3 - Tdap) 04/13/2019   COVID-19 Vaccine  (4 - 2024-25 season) 10/20/2022   Health Maintenance  Items Addressed: None   Additional Screening:  Vision Screening: Recommended annual ophthalmology exams for early detection of glaucoma and other disorders of the eye. Would you like a referral to an eye doctor? No    Dental Screening: Recommended annual dental exams for proper oral hygiene  Community Resource Referral / Chronic Care Management: CRR required this visit?  No   CCM required this visit?  No   Plan:    I have personally reviewed and noted the following in the patient's chart:   Medical and social history Use of alcohol, tobacco or illicit drugs  Current medications and supplements including opioid prescriptions. Patient is not currently taking opioid prescriptions. Functional ability and status Nutritional status Physical activity Advanced directives List of other physicians Hospitalizations, surgeries, and ER visits in previous 12 months Vitals Screenings to include cognitive, depression, and falls Referrals and appointments  In addition, I have reviewed and discussed with patient certain preventive protocols, quality metrics, and best practice recommendations. A written personalized care plan for preventive services as well as general preventive health recommendations were provided to patient.   Erminio LITTIE Saris, LPN   [id number]   After Visit Summary: (MyChart) Due to this being a telephonic visit, the after visit summary with patients personalized plan was offered to patient via MyChart   Notes: Nothing significant to report at this time.

## 2023-09-08 ENCOUNTER — Encounter: Payer: Self-pay | Admitting: Family Medicine

## 2023-09-08 ENCOUNTER — Ambulatory Visit: Payer: Medicare (Managed Care) | Admitting: Family Medicine

## 2023-09-08 VITALS — BP 138/82 | HR 58 | Temp 97.8°F | Ht 67.5 in | Wt 179.2 lb

## 2023-09-08 DIAGNOSIS — N401 Enlarged prostate with lower urinary tract symptoms: Secondary | ICD-10-CM

## 2023-09-08 DIAGNOSIS — N3941 Urge incontinence: Secondary | ICD-10-CM

## 2023-09-08 DIAGNOSIS — I5032 Chronic diastolic (congestive) heart failure: Secondary | ICD-10-CM | POA: Diagnosis not present

## 2023-09-08 DIAGNOSIS — E1169 Type 2 diabetes mellitus with other specified complication: Secondary | ICD-10-CM

## 2023-09-08 DIAGNOSIS — G4700 Insomnia, unspecified: Secondary | ICD-10-CM | POA: Diagnosis not present

## 2023-09-08 DIAGNOSIS — E1140 Type 2 diabetes mellitus with diabetic neuropathy, unspecified: Secondary | ICD-10-CM | POA: Diagnosis not present

## 2023-09-08 DIAGNOSIS — R0789 Other chest pain: Secondary | ICD-10-CM | POA: Diagnosis not present

## 2023-09-08 DIAGNOSIS — Z7984 Long term (current) use of oral hypoglycemic drugs: Secondary | ICD-10-CM | POA: Diagnosis not present

## 2023-09-08 DIAGNOSIS — I2510 Atherosclerotic heart disease of native coronary artery without angina pectoris: Secondary | ICD-10-CM

## 2023-09-08 DIAGNOSIS — E559 Vitamin D deficiency, unspecified: Secondary | ICD-10-CM | POA: Diagnosis not present

## 2023-09-08 DIAGNOSIS — I10 Essential (primary) hypertension: Secondary | ICD-10-CM

## 2023-09-08 DIAGNOSIS — N138 Other obstructive and reflux uropathy: Secondary | ICD-10-CM | POA: Diagnosis not present

## 2023-09-08 DIAGNOSIS — E785 Hyperlipidemia, unspecified: Secondary | ICD-10-CM

## 2023-09-08 DIAGNOSIS — Z0000 Encounter for general adult medical examination without abnormal findings: Secondary | ICD-10-CM | POA: Diagnosis not present

## 2023-09-08 DIAGNOSIS — Z951 Presence of aortocoronary bypass graft: Secondary | ICD-10-CM

## 2023-09-08 DIAGNOSIS — Z7189 Other specified counseling: Secondary | ICD-10-CM

## 2023-09-08 DIAGNOSIS — Z8673 Personal history of transient ischemic attack (TIA), and cerebral infarction without residual deficits: Secondary | ICD-10-CM

## 2023-09-08 MED ORDER — FUROSEMIDE 20 MG PO TABS: 20.0000 mg | ORAL_TABLET | Freq: Every day | ORAL | 3 refills | Status: AC

## 2023-09-08 MED ORDER — DULOXETINE HCL 30 MG PO CPEP: 30.0000 mg | ORAL_CAPSULE | Freq: Every day | ORAL | 3 refills | Status: AC

## 2023-09-08 MED ORDER — CLOTRIMAZOLE 1 % EX CREA: 1.0000 | TOPICAL_CREAM | Freq: Two times a day (BID) | CUTANEOUS | 1 refills | Status: AC

## 2023-09-08 MED ORDER — DOCUSATE SODIUM 100 MG PO CAPS: 100.0000 mg | ORAL_CAPSULE | Freq: Every day | ORAL | Status: AC | PRN

## 2023-09-08 MED ORDER — ASPIRIN 81 MG PO TBEC: 81.0000 mg | DELAYED_RELEASE_TABLET | ORAL | Status: DC

## 2023-09-08 MED ORDER — FINASTERIDE 5 MG PO TABS: 5.0000 mg | ORAL_TABLET | Freq: Every day | ORAL | 3 refills | Status: AC

## 2023-09-08 MED ORDER — METFORMIN HCL 500 MG PO TABS: 500.0000 mg | ORAL_TABLET | Freq: Two times a day (BID) | ORAL | 3 refills | Status: DC

## 2023-09-08 MED ORDER — MIRABEGRON ER 25 MG PO TB24: 25.0000 mg | ORAL_TABLET | Freq: Every day | ORAL | 11 refills | Status: AC

## 2023-09-08 MED ORDER — TRAZODONE HCL 50 MG PO TABS: 75.0000 mg | ORAL_TABLET | Freq: Every day | ORAL | 3 refills | Status: AC

## 2023-09-08 MED ORDER — ATORVASTATIN CALCIUM 40 MG PO TABS: 40.0000 mg | ORAL_TABLET | Freq: Every day | ORAL | 3 refills | Status: DC

## 2023-09-08 MED ORDER — CLOPIDOGREL BISULFATE 75 MG PO TABS: 75.0000 mg | ORAL_TABLET | Freq: Every day | ORAL | 3 refills | Status: AC

## 2023-09-08 MED ORDER — LOSARTAN POTASSIUM 50 MG PO TABS: 50.0000 mg | ORAL_TABLET | Freq: Every day | ORAL | 3 refills | Status: DC

## 2023-09-08 MED ORDER — ISOSORBIDE MONONITRATE ER 30 MG PO TB24: 30.0000 mg | ORAL_TABLET | Freq: Every day | ORAL | 1 refills | Status: DC

## 2023-09-08 MED ORDER — GABAPENTIN 300 MG PO CAPS: 600.0000 mg | ORAL_CAPSULE | Freq: Every day | ORAL | 3 refills | Status: AC

## 2023-09-08 NOTE — Assessment & Plan Note (Signed)
Continue myrbetriq 

## 2023-09-08 NOTE — Assessment & Plan Note (Signed)
 Chronic, stable seems off supplement - I did ask him to bring in all supplements to next visit.

## 2023-09-08 NOTE — Progress Notes (Signed)
 Ph: [phone] Fax: [phone]   Patient ID: Aaron Stafford, male    DOB: 09-Nov-1934, 89 y.o.   MRN: [id number]  This visit was conducted in person.  BP 138/82   Pulse (!) 58   Temp 97.8 F (36.6 C) (Oral)   Ht 5' 7.5 (1.715 m)   Wt 179 lb 3.2 oz (81.3 kg)   SpO2 99%   BMI 27.65 kg/m    CC: CPE Subjective:   HPI: Aaron Stafford is a 89 y.o. male presenting on 09/08/2023 for Annual Exam (/)   Saw health advisor last week for medicare wellness visit. Note reviewed.     09/05/2023    2:43 PM 08/16/2021    9:38 AM  6CIT Screen  What Year? 0 points 0 points  What month? 0 points 0 points  What time? 0 points 0 points  Count back from 20 0 points 0 points  Months in reverse 0 points 0 points  Repeat phrase 8 points 0 points  Total Score 8 points 0 points  Failed cognitive screen - had trouble memorizing the address. Denies significant trouble with this. 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   No results found.  Flowsheet Row Office Visit from 09/08/2023 in [facility] [place]  PHQ-2 Total Score 0       09/08/2023    9:00 AM 09/08/2023    8:57 AM 09/05/2023    2:34 PM 08/18/2023    9:57 AM 03/11/2023    3:50 PM  Fall Risk   Falls in the past year? 0 0 0 0 0  Number falls in past yr:  1 0  0  Injury with Fall?  0 0  0  Risk for fall due to :  Other (Comment) No Fall Risks  No Fall Risks  Follow up  Falls evaluation completed Falls prevention discussed;Education provided  Falls evaluation completed   He sees Dr Lane neurology, on plavix  75mg  daily.  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  Ran out of many medicines - requests refills of all. He states he was taken off aspirin  when he had his stroke.    Preventative: COLONOSCOPY Date: 03/2005 diverticulosis, tortuous colon with looping [name]). Cologuard negative 2017. Denies bowel changes or blood in stool. Age out.  Prostate - h/o BPH, takes finasteride . Nocturia x1, no urinary  trouble. No h/o prostate cancer. Age out.  Lung cancer screening - not eligible  Flu shot yearly  COVID vaccine - Pfizer 02/2019 x3  Pneumovax 2006, 2022. Prevnar-13 07/2013. States he had another pneumonia shot at [facility] 6 months.  Td 2011  zostavax 2012  RSV - discussed, declines Shingrix - discussed, declines Advanced planning - scanned into chart 06/2014. HCPOA is wife Aaron Stafford then granddaughter Aaron Stafford. Ok with tube feeds. Does not want prolonged life support.  Seat belt use discussed. Sunscreen use discussed. H/o melanoma removed from scalp. Sees derm regularly ([facility] in [place]).  Non smoker Alcohol - none Dentist - no dental insurance.  [facility] exam yearly [facility]  Bowels - occ constipation managing well with colace and citrucel daily as well as prn senna  Bladder - occ urge incontinence with accidents about once a week    Caffeine: 1 cup/day, 2 cups tea daily   Lives with Aaron Stafford (wife) and 3 dogs Occupation: Education officer, 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[place]  Activity: sporadic stationary bicycle Diet: good water, fruits/vegetables daily      Relevant past medical, surgical, family and  social history reviewed and updated as indicated. Interim medical history since our last visit reviewed. Allergies and medications reviewed and updated. Outpatient Medications Prior to Visit  Medication Sig Dispense Refill   Accu-Chek Softclix Lancets lancets Use as instructed to check sugars once daily E11.42 100 each 3   Alpha-Lipoic Acid 600 MG CAPS Take 1 capsule (600 mg total) by mouth daily.     Cranberry 500 MG CAPS Take 2 capsules (1,000 mg total) by mouth daily.     fluticasone  (FLONASE ) 50 MCG/ACT nasal spray SPRAY 2 SPRAYS INTO EACH NOSTRIL EVERY DAY 48 mL 3   glucose blood (ACCU-CHEK AVIVA PLUS) test strip Use as instructed to check sugars daily E11.42 100 each 3   hydrocortisone 2.5 % cream Apply 1 Application topically 2 (two) times daily.     ipratropium (ATROVENT ) 0.06 % nasal spray Place 2  sprays into both nostrils 4 (four) times daily. 15 mL 0   ketoconazole (NIZORAL) 2 % cream Apply topically.     mupirocin  ointment (BACTROBAN ) 2 % Apply 1 Application topically 2 (two) times daily.     polyethylene glycol powder (GLYCOLAX /MIRALAX ) 17 GM/SCOOP powder TAKE 8.5-17 G BY MOUTH DAILY AS NEEDED FOR MODERATE CONSTIPATION. 510 g 1   aspirin  EC 81 MG tablet Take 1 tablet (81 mg total) by mouth every Monday, Wednesday, and Friday. Swallow whole.     atorvastatin  (LIPITOR) 40 MG tablet Take 1 tablet (40 mg total) by mouth daily. 90 tablet 0   clopidogrel  (PLAVIX ) 75 MG tablet Take 1 tablet (75 mg total) by mouth daily.     clotrimazole  (LOTRIMIN ) 1 % cream Apply 1 Application topically 2 (two) times daily. 30 g 1   DULoxetine  (CYMBALTA ) 30 MG capsule Take 1 capsule (30 mg total) by mouth daily. 90 capsule 1   finasteride  (PROSCAR ) 5 MG tablet Take 1 tablet (5 mg total) by mouth daily. 90 tablet 1   furosemide  (LASIX ) 20 MG tablet Take 1 tablet (20 mg total) by mouth daily. 90 tablet 1   gabapentin  (NEURONTIN ) 300 MG capsule Take 2-3 capsules (600-900 mg total) by mouth at bedtime.     isosorbide  mononitrate (IMDUR ) 30 MG 24 hr tablet TAKE 1/2 OF A TABLET (15 MG TOTAL) BY MOUTH DAILY 45 tablet 3   losartan  (COZAAR ) 50 MG tablet Take 1 tablet (50 mg total) by mouth daily. 90 tablet 0   metFORMIN  (GLUCOPHAGE ) 500 MG tablet Take 1 tablet (500 mg total) by mouth 2 (two) times daily with a meal. Only takes 1 in AM 180 tablet 0   mirabegron  ER (MYRBETRIQ ) 25 MG TB24 tablet Take 1 tablet (25 mg total) by mouth daily. 30 tablet 3   traZODone  (DESYREL ) 50 MG tablet Take 1.5 tablets (75 mg total) by mouth at bedtime. 135 tablet 1   No facility-administered medications prior to visit.     Per HPI unless specifically indicated in ROS section below Review of Systems  Constitutional:  Negative for activity change, appetite change, chills, fatigue, fever and unexpected weight change.  HENT:  Negative  for hearing loss.   Eyes:  Negative for visual disturbance.  Respiratory:  Positive for chest tightness (exertional noted). Negative for cough, shortness of breath and wheezing.   Cardiovascular:  Positive for leg swelling. Negative for chest pain and palpitations.  Gastrointestinal:  Negative for abdominal distention, abdominal pain, blood in stool, constipation, diarrhea, nausea and vomiting.  Genitourinary:  Negative for difficulty urinating and hematuria.  Musculoskeletal:  Negative for arthralgias,  myalgias and neck pain.  Skin:  Negative for rash.  Neurological:  Negative for dizziness, seizures, syncope and headaches.  Hematological:  Negative for adenopathy. Does not bruise/bleed easily.  Psychiatric/Behavioral:  Negative for dysphoric mood. The patient is not nervous/anxious.     Objective:  BP 138/82   Pulse (!) 58   Temp 97.8 F (36.6 C) (Oral)   Ht 5' 7.5 (1.715 m)   Wt 179 lb 3.2 oz (81.3 kg)   SpO2 99%   BMI 27.65 kg/m   Wt Readings from Last 3 Encounters:  09/08/23 179 lb 3.2 oz (81.3 kg)  09/05/23 177 lb (80.3 kg)  08/18/23 177 lb 8 oz (80.5 kg)      Physical Exam Vitals and nursing note reviewed.  Constitutional:      General: He is not in acute distress.    Appearance: Normal appearance. He is well-developed. He is not ill-appearing.  HENT:     Head: Normocephalic and atraumatic.     Right Ear: Hearing, tympanic membrane, ear canal and external ear normal.     Left Ear: Hearing, tympanic membrane, ear canal and external ear normal.     Mouth/Throat:     Mouth: Mucous membranes are moist.     Pharynx: Oropharynx is clear. No oropharyngeal exudate or posterior oropharyngeal erythema.  Eyes:     General: No scleral icterus.    Extraocular Movements: Extraocular movements intact.     Conjunctiva/sclera: Conjunctivae normal.     Pupils: Pupils are equal, round, and reactive to light.  Neck:     Thyroid : No thyroid  mass or thyromegaly.     Vascular: No  carotid bruit.  Cardiovascular:     Rate and Rhythm: Normal rate and regular rhythm.     Pulses: Normal pulses.          Radial pulses are 2+ on the right side and 2+ on the left side.     Heart sounds: Normal heart sounds. No murmur heard. Pulmonary:     Effort: Pulmonary effort is normal. No respiratory distress.     Breath sounds: Normal breath sounds. No wheezing, rhonchi or rales.  Abdominal:     General: Bowel sounds are normal. There is no distension.     Palpations: Abdomen is soft. There is no mass.     Tenderness: There is no abdominal tenderness. There is no guarding or rebound.     Hernia: No hernia is present.  Musculoskeletal:        General: Normal range of motion.     Cervical back: Normal range of motion and neck supple.     Right lower leg: Edema (tr) present.     Left lower leg: Edema (tr) present.  Lymphadenopathy:     Cervical: No cervical adenopathy.  Skin:    General: Skin is warm and dry.     Findings: Erythema present. No rash.     Comments: Mild erythema to feet  Neurological:     General: No focal deficit present.     Mental Status: He is alert and oriented to person, place, and time.  Psychiatric:        Mood and Affect: Mood normal.        Behavior: Behavior normal.        Thought Content: Thought content normal.        Judgment: Judgment normal.       Results for orders placed or performed in visit on 09/01/23  Vitamin B12  Collection Time: 09/01/23  7:37 AM  Result Value Ref Range   Vitamin B-12 498 211 - 911 pg/mL  VITAMIN D  25 Hydroxy (Vit-D Deficiency, Fractures)   Collection Time: 09/01/23  7:37 AM  Result Value Ref Range   VITD 34.02 30.00 - 100.00 ng/mL  Comprehensive metabolic panel with GFR   Collection Time: 09/01/23  7:37 AM  Result Value Ref Range   Sodium 142 135 - 145 mEq/L   Potassium 5.1 3.5 - 5.1 mEq/L   Chloride 106 96 - 112 mEq/L   CO2 29 19 - 32 mEq/L   Glucose, Bld 129 (H) 70 - 99 mg/dL   BUN 23 6 - 23 mg/dL    Creatinine, Ser 8.90 0.40 - 1.50 mg/dL   Total Bilirubin 0.6 0.2 - 1.2 mg/dL   Alkaline Phosphatase 63 39 - 117 U/L   AST 19 0 - 37 U/L   ALT 23 0 - 53 U/L   Total Protein 6.2 6.0 - 8.3 g/dL   Albumin 4.1 3.5 - 5.2 g/dL   GFR 38.97 >39.99 mL/min   Calcium  9.9 8.4 - 10.5 mg/dL  Lipid panel   Collection Time: 09/01/23  7:37 AM  Result Value Ref Range   Cholesterol 84 0 - 200 mg/dL   Triglycerides 02.9 0.0 - 149.0 mg/dL   HDL 66.69 (L) >60.99 mg/dL   VLDL 80.5 0.0 - 59.9 mg/dL   LDL Cholesterol 31 0 - 99 mg/dL   Total CHOL/HDL Ratio 3    NonHDL 50.64   Microalbumin / creatinine urine ratio   Collection Time: 09/01/23  7:37 AM  Result Value Ref Range   Microalb, Ur 6.3 (H) 0.0 - 1.9 mg/dL   Creatinine,U 11.4 mg/dL   Microalb Creat Ratio 71.0 (H) 0.0 - 30.0 mg/g  Hemoglobin A1c   Collection Time: 09/01/23  7:37 AM  Result Value Ref Range   Hgb A1c MFr Bld 7.3 (H) 4.6 - 6.5 %    Assessment & Plan:   Problem List Items Addressed This Visit     Advanced care planning/counseling discussion (Chronic)   Previously discussed      Health maintenance examination - Primary (Chronic)   Preventative protocols reviewed and updated unless pt declined. Discussed healthy diet and lifestyle.       Vitamin D  deficiency   Chronic, stable seems off supplement - I did ask him to bring in all supplements to next visit.       CAD (coronary artery disease)   Relevant Medications   atorvastatin  (LIPITOR) 40 MG tablet   furosemide  (LASIX ) 20 MG tablet   losartan  (COZAAR ) 50 MG tablet   aspirin  EC 81 MG tablet   isosorbide  mononitrate (IMDUR ) 30 MG 24 hr tablet   Insomnia   Continue trazodone  75mg  nightly.       Relevant Medications   traZODone  (DESYREL ) 50 MG tablet   Urinary incontinence, urge   Continue myrbetriq .       Relevant Medications   finasteride  (PROSCAR ) 5 MG tablet   mirabegron  ER (MYRBETRIQ ) 25 MG TB24 tablet   HTN (hypertension)   Chronic, stable. Continue current  regimen. He was not on isosorbide  - refilled at 30mg  dose as per last cardiology note.       Relevant Medications   atorvastatin  (LIPITOR) 40 MG tablet   furosemide  (LASIX ) 20 MG tablet   losartan  (COZAAR ) 50 MG tablet   aspirin  EC 81 MG tablet   isosorbide  mononitrate (IMDUR ) 30 MG 24 hr tablet  Hyperlipidemia associated with type 2 diabetes mellitus (HCC)   Chronic, overall stable on atorvastatin  40mg  daily - continue. The ASCVD Risk score (Arnett DK, et al., 2019) failed to calculate for the following reasons:   The 2019 ASCVD risk score is only valid for ages 76 to 57   Risk score cannot be calculated because patient has a medical history suggesting prior/existing ASCVD       Relevant Medications   atorvastatin  (LIPITOR) 40 MG tablet   furosemide  (LASIX ) 20 MG tablet   losartan  (COZAAR ) 50 MG tablet   metFORMIN  (GLUCOPHAGE ) 500 MG tablet   aspirin  EC 81 MG tablet   isosorbide  mononitrate (IMDUR ) 30 MG 24 hr tablet   Type 2 diabetes mellitus with diabetic neuropathy, unspecified (HCC)   Chronic, mild deterioration noted. He has only been taking metformin  500mg  once daily - did recommend he take BID as prescribed. Reassess at 4 mo f/u visit.       Relevant Medications   atorvastatin  (LIPITOR) 40 MG tablet   losartan  (COZAAR ) 50 MG tablet   metFORMIN  (GLUCOPHAGE ) 500 MG tablet   aspirin  EC 81 MG tablet   S/P CABG (coronary artery bypass graft)   Continue aspirin , plavix , statin.       BPH with obstruction/lower urinary tract symptoms   Continue finasteride .       Relevant Medications   finasteride  (PROSCAR ) 5 MG tablet   Chest tightness   Was not taking imdur . Restart this, rec cards f/u if ongoing exertional chest tightness.       Relevant Medications   isosorbide  mononitrate (IMDUR ) 30 MG 24 hr tablet   History of cerebellar stroke   Continue plavix , restart aspirin  MWF dosing.       Chronic diastolic CHF (congestive heart failure) (HCC)   Appreciate  cardiology care, continue lasix  20mg  daily.       Relevant Medications   atorvastatin  (LIPITOR) 40 MG tablet   furosemide  (LASIX ) 20 MG tablet   losartan  (COZAAR ) 50 MG tablet   aspirin  EC 81 MG tablet   isosorbide  mononitrate (IMDUR ) 30 MG 24 hr tablet     Meds ordered this encounter  Medications   finasteride  (PROSCAR ) 5 MG tablet    Sig: Take 1 tablet (5 mg total) by mouth daily.    Dispense:  90 tablet    Refill:  3   atorvastatin  (LIPITOR) 40 MG tablet    Sig: Take 1 tablet (40 mg total) by mouth daily.    Dispense:  90 tablet    Refill:  3   traZODone  (DESYREL ) 50 MG tablet    Sig: Take 1.5 tablets (75 mg total) by mouth at bedtime.    Dispense:  135 tablet    Refill:  3   furosemide  (LASIX ) 20 MG tablet    Sig: Take 1 tablet (20 mg total) by mouth daily.    Dispense:  90 tablet    Refill:  3   losartan  (COZAAR ) 50 MG tablet    Sig: Take 1 tablet (50 mg total) by mouth daily.    Dispense:  90 tablet    Refill:  3   metFORMIN  (GLUCOPHAGE ) 500 MG tablet    Sig: Take 1 tablet (500 mg total) by mouth 2 (two) times daily with a meal. Only takes 1 in AM    Dispense:  180 tablet    Refill:  3   DULoxetine  (CYMBALTA ) 30 MG capsule    Sig: Take 1 capsule (30 mg total) by mouth  daily.    Dispense:  90 capsule    Refill:  3   clopidogrel  (PLAVIX ) 75 MG tablet    Sig: Take 1 tablet (75 mg total) by mouth daily.    Dispense:  90 tablet    Refill:  3   gabapentin  (NEURONTIN ) 300 MG capsule    Sig: Take 2-3 capsules (600-900 mg total) by mouth at bedtime.    Dispense:  200 capsule    Refill:  3   mirabegron  ER (MYRBETRIQ ) 25 MG TB24 tablet    Sig: Take 1 tablet (25 mg total) by mouth daily.    Dispense:  30 tablet    Refill:  11   aspirin  EC 81 MG tablet    Sig: Take 1 tablet (81 mg total) by mouth every Monday, Wednesday, and Friday. Swallow whole.   docusate sodium  (COLACE) 100 MG capsule    Sig: Take 1 capsule (100 mg total) by mouth daily as needed for moderate  constipation.   isosorbide  mononitrate (IMDUR ) 30 MG 24 hr tablet    Sig: Take 1 tablet (30 mg total) by mouth daily.    Dispense:  90 tablet    Refill:  1    Next refill to come through cardiology   clotrimazole  (LOTRIMIN ) 1 % cream    Sig: Apply 1 Application topically 2 (two) times daily.    Dispense:  30 g    Refill:  1    No orders of the defined types were placed in this encounter.   Patient Instructions  Restart aspirin  81mg  MWF. Continue plavix . Medicines refilled. I have also refilled isosorbide  30mg  for you today.  Consider shingrix shots.  Good to see you today Return as needed or in 4 months for follow up visit  Bring in all supplements to next visit.   Follow up plan: Return in about 4 months (around 01/09/2024) for follow up visit.  Anton Blas, MD

## 2023-09-08 NOTE — Assessment & Plan Note (Signed)
 Continue aspirin ,plavix, statin.

## 2023-09-08 NOTE — Assessment & Plan Note (Signed)
 Appreciate cardiology care, continue lasix  20mg  daily.

## 2023-09-08 NOTE — Assessment & Plan Note (Signed)
 Continue plavix , restart aspirin  MWF dosing.

## 2023-09-08 NOTE — Assessment & Plan Note (Signed)
 Preventative protocols reviewed and updated unless pt declined. Discussed healthy diet and lifestyle.

## 2023-09-08 NOTE — Patient Instructions (Addendum)
 Restart aspirin  81mg  MWF. Continue plavix . Medicines refilled. I have also refilled isosorbide  30mg  for you today.  Consider shingrix shots.  Good to see you today Return as needed or in 4 months for follow up visit  Bring in all supplements to next visit.

## 2023-09-08 NOTE — Assessment & Plan Note (Signed)
 -  Continue finasteride

## 2023-09-08 NOTE — Assessment & Plan Note (Signed)
 Chronic, overall stable on atorvastatin  40mg  daily - continue. The ASCVD Risk score (Arnett DK, et al., 2019) failed to calculate for the following reasons:   The 2019 ASCVD risk score is only valid for ages 81 to 16   Risk score cannot be calculated because patient has a medical history suggesting prior/existing ASCVD

## 2023-09-08 NOTE — Assessment & Plan Note (Signed)
Continue trazodone 75 mg nightly

## 2023-09-08 NOTE — Assessment & Plan Note (Signed)
 Chronic, mild deterioration noted. He has only been taking metformin  500mg  once daily - did recommend he take BID as prescribed. Reassess at 4 mo f/u visit.

## 2023-09-08 NOTE — Assessment & Plan Note (Signed)
 Chronic, stable. Continue current regimen. He was not on isosorbide  - refilled at 30mg  dose as per last cardiology note.

## 2023-09-08 NOTE — Assessment & Plan Note (Signed)
 Previously discussed.

## 2023-09-08 NOTE — Assessment & Plan Note (Signed)
 Was not taking imdur . Restart this, rec cards f/u if ongoing exertional chest tightness.

## 2023-09-11 ENCOUNTER — Other Ambulatory Visit: Payer: Self-pay | Admitting: Family Medicine

## 2023-09-11 DIAGNOSIS — N138 Other obstructive and reflux uropathy: Secondary | ICD-10-CM

## 2023-10-06 DIAGNOSIS — Z85820 Personal history of malignant melanoma of skin: Secondary | ICD-10-CM | POA: Diagnosis not present

## 2023-10-06 DIAGNOSIS — Z872 Personal history of diseases of the skin and subcutaneous tissue: Secondary | ICD-10-CM | POA: Diagnosis not present

## 2023-10-06 DIAGNOSIS — L578 Other skin changes due to chronic exposure to nonionizing radiation: Secondary | ICD-10-CM | POA: Diagnosis not present

## 2023-10-06 DIAGNOSIS — L570 Actinic keratosis: Secondary | ICD-10-CM | POA: Diagnosis not present

## 2023-10-06 DIAGNOSIS — Z859 Personal history of malignant neoplasm, unspecified: Secondary | ICD-10-CM | POA: Diagnosis not present

## 2023-10-06 DIAGNOSIS — L304 Erythema intertrigo: Secondary | ICD-10-CM | POA: Diagnosis not present

## 2023-10-06 DIAGNOSIS — Z86018 Personal history of other benign neoplasm: Secondary | ICD-10-CM | POA: Diagnosis not present

## 2023-12-04 DIAGNOSIS — B001 Herpesviral vesicular dermatitis: Secondary | ICD-10-CM | POA: Diagnosis not present

## 2023-12-06 ENCOUNTER — Other Ambulatory Visit: Payer: Self-pay | Admitting: Family Medicine

## 2023-12-06 DIAGNOSIS — I10 Essential (primary) hypertension: Secondary | ICD-10-CM

## 2023-12-06 DIAGNOSIS — E1169 Type 2 diabetes mellitus with other specified complication: Secondary | ICD-10-CM

## 2023-12-06 DIAGNOSIS — E1140 Type 2 diabetes mellitus with diabetic neuropathy, unspecified: Secondary | ICD-10-CM

## 2024-01-12 ENCOUNTER — Ambulatory Visit: Payer: Medicare (Managed Care) | Admitting: Family Medicine

## 2024-01-12 ENCOUNTER — Encounter: Payer: Self-pay | Admitting: Family Medicine

## 2024-01-12 VITALS — BP 130/78 | HR 64 | Temp 97.6°F | Ht 67.5 in | Wt 178.5 lb

## 2024-01-12 DIAGNOSIS — Z23 Encounter for immunization: Secondary | ICD-10-CM | POA: Diagnosis not present

## 2024-01-12 DIAGNOSIS — Z8673 Personal history of transient ischemic attack (TIA), and cerebral infarction without residual deficits: Secondary | ICD-10-CM

## 2024-01-12 DIAGNOSIS — E1140 Type 2 diabetes mellitus with diabetic neuropathy, unspecified: Secondary | ICD-10-CM

## 2024-01-12 LAB — POCT GLYCOSYLATED HEMOGLOBIN (HGB A1C): Hemoglobin A1C: 6.9 % — AB (ref 4.0–5.6)

## 2024-01-12 MED ORDER — ZINC GLUCONATE 50 MG PO TABS
50.0000 mg | ORAL_TABLET | Freq: Every day | ORAL | Status: AC | PRN
Start: 2024-01-12 — End: ?

## 2024-01-12 MED ORDER — NEURIVA PO CAPS: 1.0000 | ORAL_CAPSULE | Freq: Every day | ORAL | Status: AC

## 2024-01-12 MED ORDER — ASPIRIN 81 MG PO TBEC: 81.0000 mg | DELAYED_RELEASE_TABLET | ORAL | Status: AC

## 2024-01-12 NOTE — Assessment & Plan Note (Signed)
 Chronic, well controlled as evidenced by improved A1c, although only taking metformin  once daily. Encouraged he try BID dosing.

## 2024-01-12 NOTE — Assessment & Plan Note (Signed)
 Continue plavix  and statin, restart aspirin  MWF dosing.

## 2024-01-12 NOTE — Progress Notes (Signed)
 Ph: [phone] Fax: [phone]   Patient ID: Aaron Stafford, male    DOB: [birth year], 89 y.o.   MRN: [id number]  This visit was conducted in person.  BP 130/78   Pulse 64   Temp 97.6 F (36.4 C) (Oral)   Ht 5' 7.5 (1.715 m)   Wt 178 lb 8 oz (81 kg)   SpO2 97%   BMI 27.54 kg/m    CC: DM f/u visit  Subjective:   HPI: Aaron Stafford is a 89 y.o. male presenting on 01/12/2024 for No chief complaint on file.   DM - does not regularly check sugars. Compliant with antihyperglycemic regimen which includes: metformin  500mg  bid - but only taking in 1 daily. He has been cutting back on sweets. Denies low sugars or hypoglycemic symptoms. Denies blurry vision. Last diabetic eye exam 04/2023. Glucometer brand: accuchek aviva plus. Last foot exam: 07/2023. DSME: referred 02/2023, did not see nutritionist. Ongoing neuropathy - manages with ALA supplement, cymbalta  30mg  daily, gabapentin  600-900mg  nightly.  Lab Results  Component Value Date   HGBA1C 6.9 (A) 01/12/2024   Diabetic Foot Exam - Simple   No data filed    Lab Results  Component Value Date   MICROALBUR 6.3 (H) 09/01/2023         Relevant past medical, surgical, family and social history reviewed and updated as indicated. Interim medical history since our last visit reviewed. Allergies and medications reviewed and updated. Outpatient Medications Prior to Visit  Medication Sig Dispense Refill   Accu-Chek Softclix Lancets lancets Use as instructed to check sugars once daily E11.42 100 each 3   Alpha-Lipoic Acid 600 MG CAPS Take 1 capsule (600 mg total) by mouth daily.     atorvastatin  (LIPITOR) 40 MG tablet TAKE 1 TABLET(40 MG) BY MOUTH DAILY 90 tablet 0   clopidogrel  (PLAVIX ) 75 MG tablet Take 1 tablet (75 mg total) by mouth daily. 90 tablet 3   Cranberry 500 MG CAPS Take 2 capsules (1,000 mg total) by mouth daily.     docusate sodium  (COLACE) 100 MG capsule Take 1 capsule (100 mg total) by mouth daily as needed for  moderate constipation.     DULoxetine  (CYMBALTA ) 30 MG capsule Take 1 capsule (30 mg total) by mouth daily. 90 capsule 3   finasteride  (PROSCAR ) 5 MG tablet Take 1 tablet (5 mg total) by mouth daily. 90 tablet 3   fluticasone  (FLONASE ) 50 MCG/ACT nasal spray SPRAY 2 SPRAYS INTO EACH NOSTRIL EVERY DAY 48 mL 3   furosemide  (LASIX ) 20 MG tablet Take 1 tablet (20 mg total) by mouth daily. 90 tablet 3   gabapentin  (NEURONTIN ) 300 MG capsule Take 2-3 capsules (600-900 mg total) by mouth at bedtime. 200 capsule 3   glucose blood (ACCU-CHEK AVIVA PLUS) test strip Use as instructed to check sugars daily E11.42 100 each 3   hydrocortisone 2.5 % cream Apply 1 Application topically 2 (two) times daily.     isosorbide  mononitrate (IMDUR ) 30 MG 24 hr tablet Take 1 tablet (30 mg total) by mouth daily. 90 tablet 1   ketoconazole (NIZORAL) 2 % cream Apply topically.     losartan  (COZAAR ) 50 MG tablet TAKE 1 TABLET(50 MG) BY MOUTH DAILY 90 tablet 0   metFORMIN  (GLUCOPHAGE ) 500 MG tablet TAKE 1 TABLET BY MOUTH 2 TIMES DAILY WITH A MEAL 180 tablet 0   mirabegron  ER (MYRBETRIQ ) 25 MG TB24 tablet Take 1 tablet (25 mg total) by mouth daily. 30 tablet 11  polyethylene glycol powder (GLYCOLAX /MIRALAX ) 17 GM/SCOOP powder TAKE 8.5-17 G BY MOUTH DAILY AS NEEDED FOR MODERATE CONSTIPATION. 510 g 1   traZODone  (DESYREL ) 50 MG tablet Take 1.5 tablets (75 mg total) by mouth at bedtime. 135 tablet 3   clotrimazole  (LOTRIMIN ) 1 % cream Apply 1 Application topically 2 (two) times daily. (Patient not taking: Reported on 01/12/2024) 30 g 1   ipratropium (ATROVENT ) 0.06 % nasal spray Place 2 sprays into both nostrils 4 (four) times daily. (Patient not taking: Reported on 01/12/2024) 15 mL 0   mupirocin  ointment (BACTROBAN ) 2 % Apply 1 Application topically 2 (two) times daily.     aspirin  EC 81 MG tablet Take 1 tablet (81 mg total) by mouth every Monday, Wednesday, and Friday. Swallow whole. (Patient not taking: Reported on 01/12/2024)      No facility-administered medications prior to visit.     Per HPI unless specifically indicated in ROS section below Review of Systems  Objective:  BP 130/78   Pulse 64   Temp 97.6 F (36.4 C) (Oral)   Ht 5' 7.5 (1.715 m)   Wt 178 lb 8 oz (81 kg)   SpO2 97%   BMI 27.54 kg/m   Wt Readings from Last 3 Encounters:  01/12/24 178 lb 8 oz (81 kg)  09/08/23 179 lb 3.2 oz (81.3 kg)  09/05/23 177 lb (80.3 kg)      Physical Exam Vitals and nursing note reviewed.  Constitutional:      Appearance: Normal appearance. He is not ill-appearing.  HENT:     Head: Normocephalic and atraumatic.     Mouth/Throat:     Mouth: Mucous membranes are moist.     Pharynx: Oropharynx is clear. No oropharyngeal exudate or posterior oropharyngeal erythema.  Eyes:     Extraocular Movements: Extraocular movements intact.     Conjunctiva/sclera: Conjunctivae normal.     Pupils: Pupils are equal, round, and reactive to light.  Cardiovascular:     Rate and Rhythm: Normal rate and regular rhythm.     Pulses: Normal pulses.     Heart sounds: Normal heart sounds. No murmur heard. Pulmonary:     Effort: Pulmonary effort is normal. No respiratory distress.     Breath sounds: Normal breath sounds. No wheezing, rhonchi or rales.  Musculoskeletal:     Cervical back: Normal range of motion and neck supple.     Right lower leg: No edema.     Left lower leg: No edema.     Comments: See HPI for foot exam if done  Skin:    General: Skin is warm and dry.     Findings: No rash.  Neurological:     Mental Status: He is alert.  Psychiatric:        Mood and Affect: Mood normal.        Behavior: Behavior normal.       Results for orders placed or performed in visit on 01/12/24  POCT glycosylated hemoglobin (Hb A1C)   Collection Time: 01/12/24  9:27 AM  Result Value Ref Range   Hemoglobin A1C 6.9 (A) 4.0 - 5.6 %   HbA1c POC (<> result, manual entry)     HbA1c, POC (prediabetic range)     HbA1c, POC  (controlled diabetic range)      Assessment & Plan:   Problem List Items Addressed This Visit     Type 2 diabetes mellitus with diabetic neuropathy, unspecified (HCC) - Primary   Chronic, well controlled as evidenced by  improved A1c, although only taking metformin  once daily. Encouraged he try BID dosing.       Relevant Medications   aspirin  EC 81 MG tablet   Other Relevant Orders   POCT glycosylated hemoglobin (Hb A1C) (Completed)   History of cerebellar stroke   Continue plavix  and statin, restart aspirin  MWF dosing.       Other Visit Diagnoses       Encounter for immunization       Relevant Orders   Flu vaccine HIGH DOSE PF(Fluzone Trivalent) (Completed)        Meds ordered this encounter  Medications   zinc  gluconate 50 MG tablet    Sig: Take 1 tablet (50 mg total) by mouth daily as needed.   aspirin  EC 81 MG tablet    Sig: Take 1 tablet (81 mg total) by mouth every Monday, Wednesday, and Friday. Swallow whole.   Misc Natural Products (NEURIVA) CAPS    Sig: Take 1 capsule by mouth daily.    Orders Placed This Encounter  Procedures   Flu vaccine HIGH DOSE PF(Fluzone Trivalent)   POCT glycosylated hemoglobin (Hb A1C)    Patient Instructions  You are doing well today Restart aspirin  81mg  MWF.  Try taking metformin  500mg  twice daliy.  Return in 8 months for physical. Good to see you today.   Follow up plan: Return in about 8 months (around 09/10/2024) for annual exam, prior fasting for blood work, medicare wellness visit.  Anton Blas, MD

## 2024-01-12 NOTE — Patient Instructions (Addendum)
 You are doing well today Restart aspirin  81mg  MWF.  Try taking metformin  500mg  twice daliy.  Return in 8 months for physical. Good to see you today.

## 2024-02-03 DIAGNOSIS — L570 Actinic keratosis: Secondary | ICD-10-CM | POA: Diagnosis not present

## 2024-02-03 DIAGNOSIS — D485 Neoplasm of uncertain behavior of skin: Secondary | ICD-10-CM | POA: Diagnosis not present

## 2024-03-11 ENCOUNTER — Other Ambulatory Visit: Payer: Self-pay | Admitting: Family Medicine

## 2024-03-11 DIAGNOSIS — R0789 Other chest pain: Secondary | ICD-10-CM

## 2024-03-11 DIAGNOSIS — I2510 Atherosclerotic heart disease of native coronary artery without angina pectoris: Secondary | ICD-10-CM

## 2024-03-11 DIAGNOSIS — I10 Essential (primary) hypertension: Secondary | ICD-10-CM

## 2024-03-11 DIAGNOSIS — E1169 Type 2 diabetes mellitus with other specified complication: Secondary | ICD-10-CM

## 2024-03-16 ENCOUNTER — Other Ambulatory Visit: Payer: Self-pay | Admitting: Family Medicine

## 2024-03-16 DIAGNOSIS — E1140 Type 2 diabetes mellitus with diabetic neuropathy, unspecified: Secondary | ICD-10-CM

## 2024-03-19 ENCOUNTER — Encounter: Payer: Self-pay | Admitting: Nurse Practitioner

## 2024-03-19 ENCOUNTER — Ambulatory Visit: Payer: Medicare (Managed Care) | Attending: Nurse Practitioner | Admitting: Nurse Practitioner

## 2024-03-19 VITALS — BP 152/72 | HR 58 | Ht 68.0 in | Wt 181.0 lb

## 2024-03-19 DIAGNOSIS — I639 Cerebral infarction, unspecified: Secondary | ICD-10-CM | POA: Diagnosis not present

## 2024-03-19 DIAGNOSIS — E785 Hyperlipidemia, unspecified: Secondary | ICD-10-CM | POA: Diagnosis not present

## 2024-03-19 DIAGNOSIS — I10 Essential (primary) hypertension: Secondary | ICD-10-CM

## 2024-03-19 DIAGNOSIS — E118 Type 2 diabetes mellitus with unspecified complications: Secondary | ICD-10-CM

## 2024-03-19 DIAGNOSIS — I2510 Atherosclerotic heart disease of native coronary artery without angina pectoris: Secondary | ICD-10-CM | POA: Diagnosis not present

## 2024-03-19 MED ORDER — LOSARTAN POTASSIUM 100 MG PO TABS: 100.0000 mg | ORAL_TABLET | Freq: Every day | ORAL | 1 refills | Status: AC

## 2024-03-19 NOTE — Progress Notes (Signed)
 "    Office Visit    Patient Name: Aaron Stafford Date of Encounter: 03/19/2024  Primary Care Provider:  Rilla Baller, MD Primary Cardiologist:  Evalene Lunger, MD    Chief Complaint    89 y.o. male with a history of CAD status post CABG x4 in 1995, hypertension, hyperlipidemia, diabetes, and stroke, who presents for CAD follow-up.  Past Medical History   Subjective   Past Medical History:  Diagnosis Date   Anxiety    Bell's palsy 04/10/2023   CAD (coronary artery disease)    a. 1995 s/p CABG x 4 (LIMA->LAD, VG->OM1->OM2, VG->RCA); b. 07/2009 PCI: LM 60, LAD 100ost, LCX 100p, RCA 100, LIMA->LAD atretic, VG->OM1->OM2 patent w/ collats to dLAD, VG->RCA 95 (3.5x15 Vision BMS). EF 55%; c. 02/2021 MV: EF 30% (50-55% by echo), large inferolateral scar w/ HK. No ischemia.   Cerebellar stroke (HCC)    06/12/21, 06/27/21-->DAPT   Chronic rhinitis    [facility] [place]   COVID-19 03/11/2023   COVID-19 virus infection 09/2019   Depression    Diabetes mellitus type II    Diastolic dysfunction    a. 03/2021 Echo: EF 50-55%, no rwma, GrI DD. RVSP 35.6mmHg. Mild MR; b. 03/2023 Echo: EF 50-55%, inflat HK, mod LVH, GrI DD, nl RV fxn, mildly dil LA, mild MR.   Diverticulosis 06/2011   by CT and colonoscopy   Fatty liver 06/2011   by CT   GERD (gastroesophageal reflux disease)    GERD (gastroesophageal reflux disease)    History of BPH    History of shingles    HLD (hyperlipidemia)    HTN (hypertension)    IBS (irritable bowel syndrome)    Ischemic heart disease    Melanoma (HCC) 12/2015   L forearm, scalp   MI (myocardial infarction) (HCC) 1995   Right bundle branch block (RBBB)    S/P CABG (coronary artery bypass graft)    Shingles rash 09/26/2012   Sialadenitis 05/01/2017   Past Surgical History:  Procedure Laterality Date   ANKLE SURGERY  03/2002   Left ankle fracture   CARDIAC CATHETERIZATION     CATARACT EXTRACTION W/PHACO Left 06/18/2023   Procedure: PHACOEMULSIFICATION,  CATARACT, WITH IOL INSERTION 6.02 00:35.5;  Surgeon: Mittie Gaskin, MD;  Location: [facility];  Service: Ophthalmology;  Laterality: Left;   CATARACT EXTRACTION W/PHACO Right 07/02/2023   Procedure: PHACOEMULSIFICATION, CATARACT, WITH IOL INSERTION 7.06 00:33.6;  Surgeon: Mittie Gaskin, MD;  Location: [facility];  Service: Ophthalmology;  Laterality: Right;   CHOLECYSTECTOMY  1985   COLONOSCOPY     diverticulosis, tortuous colon with looping [name])   CORONARY ARTERY BYPASS GRAFT  05/1993   x6 MI Attemped angioplasty   CORONARY STENT PLACEMENT  07/25/2009   Multi-Link VISION Cobalt chromium    CYSTOSCOPY  10/03/08   Normal-Dr. Twylla   INGUINAL HERNIA REPAIR  11/2000   and ventral hernia repair    Allergies  Allergies[1]     History of Present Illness      89 y.o. y/o male with above past medical history including CAD, hypertension, hyperlipidemia, diabetes, and stroke in 05/2021. He is status post CABG times 4 in 1995, with a LIMA to the LAD, vein graft to the RCA, and sequential vein graft to the OM1 and OM 2. In June 2011, he required bare-metal stenting of the vein graft to the RCA. At that time, he had native three-vessel occlusive disease, an atretic LIMA to LAD, and patent sequential vein graft to OM1  and OM 2, with left to left collaterals to the distal LAD.  In January 2023 he complained of exertional chest tightness and underwent Lexiscan  Myoview  which showed a large inferolateral scar without ischemia.  EF was 30% with inferolateral hypokinesis and this was followed by an echocardiogram which showed an EF of 55 to 55% with grade 1 diastolic dysfunction, normal RV function, RVSP of 35.8 mmHg, and mild MR.  He was placed on long-acting nitrate therapy.  In April 2023, he was admitted with aphasia and weakness and found to have a small acute infarct of the left cerebellar tonsil with occlusion of the left vertebral artery at the origin as well as  severe stenosis in the left V4 segment.  Echo during admission showed EF 50 to 55% with grade 2 diastolic dysfunction and he was placed on dual antiplatelet therapy with aspirin  and Plavix  but was subsequently switched to ticagrelor  following readmission in May 2023 with recurrent cerebellar stroke.  Following 3 months of ticagrelor  therapy, he was placed back on Plavix  (in addition to aspirin ).  In January 2025, Aaron Stafford was seen in clinic and complained of intermittent exertional chest tightness.  Isosorbide  dose was increased to 30 mg daily.  He was readmitted in February 2025 with stroke-like symptoms.  MRI did not show any acute stroke.  He was seen by neurology and diagnosed with Bell's palsy.  Echocardiogram was performed and was overall stable from prior evaluations with an EF of 50 to 55%, grade 1 diastolic dysfunction, moderate LVH, inferolateral hypokinesis, normal RV function, and mild MR.   Over the past year, Aaron Stafford has done well from a cardiac standpoint.  He is not routinely exercising but notes that he stays very busy, visiting his parishioners regularly, often walking across hospital parking lots and up-and-down stairs.  He notes that since his last visit, after increasing isosorbide , that he has not been having any chest pain or dyspnea.  He denies palpitations, PND, orthopnea, dizziness, syncope, edema, or early satiety.  He occasionally checks his blood pressure at home and notes that it is usually in the 150s. Objective   Home Medications    Current Outpatient Medications  Medication Sig Dispense Refill   Accu-Chek Softclix Lancets lancets Use as instructed to check sugars once daily E11.42 100 each 3   Acetaminophen  (TYLENOL  PO) Take by mouth as needed.     Alpha-Lipoic Acid 600 MG CAPS Take 1 capsule (600 mg total) by mouth daily.     aspirin  EC 81 MG tablet Take 1 tablet (81 mg total) by mouth every Monday, Wednesday, and Friday. Swallow whole.     atorvastatin  (LIPITOR)  40 MG tablet TAKE 1 TABLET(40 MG) BY MOUTH DAILY 90 tablet 1   clopidogrel  (PLAVIX ) 75 MG tablet Take 1 tablet (75 mg total) by mouth daily. 90 tablet 3   clotrimazole  (LOTRIMIN ) 1 % cream Apply 1 Application topically 2 (two) times daily. 30 g 1   Cranberry 500 MG CAPS Take 2 capsules (1,000 mg total) by mouth daily.     docusate sodium  (COLACE) 100 MG capsule Take 1 capsule (100 mg total) by mouth daily as needed for moderate constipation.     DULoxetine  (CYMBALTA ) 30 MG capsule Take 1 capsule (30 mg total) by mouth daily. 90 capsule 3   finasteride  (PROSCAR ) 5 MG tablet Take 1 tablet (5 mg total) by mouth daily. 90 tablet 3   fluticasone  (FLONASE ) 50 MCG/ACT nasal spray SPRAY 2 SPRAYS INTO EACH NOSTRIL EVERY DAY  48 mL 3   furosemide  (LASIX ) 20 MG tablet Take 1 tablet (20 mg total) by mouth daily. 90 tablet 3   gabapentin  (NEURONTIN ) 300 MG capsule Take 2-3 capsules (600-900 mg total) by mouth at bedtime. 200 capsule 3   glucose blood (ACCU-CHEK AVIVA PLUS) test strip Use as instructed to check sugars daily E11.42 100 each 3   hydrocortisone 2.5 % cream Apply 1 Application topically 2 (two) times daily.     ipratropium (ATROVENT ) 0.06 % nasal spray Place 2 sprays into both nostrils 4 (four) times daily. 15 mL 0   isosorbide  mononitrate (IMDUR ) 30 MG 24 hr tablet TAKE 1 TABLET(30 MG) BY MOUTH DAILY 90 tablet 1   ketoconazole (NIZORAL) 2 % cream Apply topically.     losartan  (COZAAR ) 50 MG tablet TAKE 1 TABLET(50 MG) BY MOUTH DAILY 90 tablet 1   metFORMIN  (GLUCOPHAGE ) 500 MG tablet TAKE 1 TABLET BY MOUTH 2 TIMES DAILY WITH A MEAL 180 tablet 1   mirabegron  ER (MYRBETRIQ ) 25 MG TB24 tablet Take 1 tablet (25 mg total) by mouth daily. 30 tablet 11   Misc Natural Products (NEURIVA) CAPS Take 1 capsule by mouth daily.     mupirocin  ointment (BACTROBAN ) 2 % Apply 1 Application topically 2 (two) times daily.     polyethylene glycol powder (GLYCOLAX /MIRALAX ) 17 GM/SCOOP powder TAKE 8.5-17 G BY MOUTH DAILY  AS NEEDED FOR MODERATE CONSTIPATION. 510 g 1   traZODone  (DESYREL ) 50 MG tablet Take 1.5 tablets (75 mg total) by mouth at bedtime. 135 tablet 3   zinc  gluconate 50 MG tablet Take 1 tablet (50 mg total) by mouth daily as needed.     No current facility-administered medications for this visit.     Physical Exam    VS:  BP (!) 148/80 (BP Location: Left Arm, Patient Position: Sitting, Cuff Size: Normal)   Pulse (!) 58   Ht 5' 8 (1.727 m)   Wt 181 lb (82.1 kg)   SpO2 98%   BMI 27.52 kg/m  , BMI Body mass index is 27.52 kg/m.    Vitals:   03/19/24 0949 03/19/24 1027  BP: (!) 148/80 (!) 152/72  Pulse: (!) 58   SpO2: 98%           GEN: Well nourished, well developed, in no acute distress. HEENT: normal. Neck: Supple, no JVD, carotid bruits, or masses. Cardiac: RRR, no murmurs, rubs, or gallops. No clubbing, cyanosis, edema.  Radials 2+/PT 2+ and equal bilaterally.  Respiratory:  Respirations regular and unlabored, clear to auscultation bilaterally. GI: Soft, nontender, nondistended, BS + x 4. MS: no deformity or atrophy. Skin: warm and dry, no rash. Neuro:  Strength and sensation are intact. Psych: Normal affect.  Accessory Clinical Findings    ECG personally reviewed by me today - EKG Interpretation Date/Time:  Friday March 19 2024 09:55:53 EST Ventricular Rate:  58 PR Interval:  244 QRS Duration:  142 QT Interval:  448 QTC Calculation: 439 R Axis:   -63  Text Interpretation: Sinus bradycardia with 1st degree A-V block with occasional Premature ventricular complexes Left axis deviation Right bundle branch block Inferior infarct , age undetermined Cannot rule out Anteroseptal infarct , age undetermined Confirmed by Vivienne Bruckner [phone]) on 03/19/2024 10:05:40 AM  - no acute changes.  Lab Results  Component Value Date   WBC 7.7 04/10/2023   HGB 14.3 04/10/2023   HCT 41.3 04/10/2023   MCV 87.3 04/10/2023   PLT 174 04/10/2023   Lab Results  Component Value  Date   CREATININE 1.09 09/01/2023   BUN 23 09/01/2023   NA 142 09/01/2023   K 5.1 09/01/2023   CL 106 09/01/2023   CO2 29 09/01/2023   Lab Results  Component Value Date   ALT 23 09/01/2023   AST 19 09/01/2023   ALKPHOS 63 09/01/2023   BILITOT 0.6 09/01/2023   Lab Results  Component Value Date   CHOL 84 09/01/2023   HDL 33.30 (L) 09/01/2023   LDLCALC 31 09/01/2023   LDLDIRECT 70.0 08/16/2020   TRIG 97.0 09/01/2023   CHOLHDL 3 09/01/2023    Lab Results  Component Value Date   HGBA1C 6.9 (A) 01/12/2024   Lab Results  Component Value Date   TSH 3.35 02/23/2021       Assessment & Plan    1.  Coronary artery disease: Status post CABG x 4 in 1995 with subsequent bare-metal stenting of the vein graft to the RCA in June 2011.  At that time, the sequential vein graft to the OM1 and OM 2 remained patent while the LIMA to the LAD was atretic.  He had native multivessel disease otherwise.  Stress testing in January 2023 showed a large inferolateral scar without ischemia.  Most recent echo in February 2025 showed an EF of 50 to 55% with inferolateral hypokinesis, normal RV function, and mild MR.  Over the past year, patient has done well without chest pain or dyspnea.  He does not routinely exercise but remains very active with his church and visiting parishioners 7 days a week.  He remains on aspirin , statin, clopidogrel , ARB, and long-acting nitrate therapy.  2.  Primary hypertension: Blood pressure elevated today on 2 consecutive recordings.  I am increasing losartan  to 100 mg daily.  Plan to follow-up base metabolic panel in 1 week.  Will see him back in the office in 1 month to reevaluate and consider adding calcium  channel blocker.  3.  Hyperlipidemia: LDL of 31 in July 2025 with normal LFTs at that time.  He remains on atorvastatin  therapy.  4.  Type 2 diabetes mellitus: A1c 6.9 in November 2025.  He is followed by primary care and takes metformin .  5.  History of stroke:  Cerebellar strokes in April and then again in May 2023.  Admitted with stroke-like symptoms in February 2025 that was diagnosed with Bell's palsy.  Denies any neurologic deficits at this time.  He remains on dual antiplatelet therapy as well as statin.  6.  Disposition: Follow-up basic metabolic panel in 1 week.  Follow-up in clinic in 1 month to reevaluate blood pressure.  Lonni Meager, NP 03/19/2024, 10:10 AM     [1]  Allergies Allergen Reactions   Morphine Sulfate     REACTION: Hallucinations   Nitroglycerin     REACTION: Decreased BP   Contrast Media [Iodinated Contrast Media] Rash    Severe hives   "

## 2024-04-21 ENCOUNTER — Ambulatory Visit: Payer: Medicare (Managed Care) | Admitting: Nurse Practitioner

## 2024-09-01 ENCOUNTER — Other Ambulatory Visit: Payer: Medicare (Managed Care)

## 2024-09-07 ENCOUNTER — Ambulatory Visit: Payer: Medicare (Managed Care)

## 2024-09-08 ENCOUNTER — Encounter: Payer: Medicare (Managed Care) | Admitting: Family Medicine
